# Patient Record
Sex: Female | Born: 1935 | Race: White | Hispanic: No | Smoking: Never smoker
Health system: Southern US, Community
[De-identification: ages and names within clinical notes are randomized; demographics above are authoritative.]

## PROBLEM LIST (undated history)

## (undated) DIAGNOSIS — D649 Anemia, unspecified: Secondary | ICD-10-CM

## (undated) DIAGNOSIS — R269 Unspecified abnormalities of gait and mobility: Secondary | ICD-10-CM

## (undated) DIAGNOSIS — M1990 Unspecified osteoarthritis, unspecified site: Secondary | ICD-10-CM

## (undated) DIAGNOSIS — R7989 Other specified abnormal findings of blood chemistry: Secondary | ICD-10-CM

## (undated) DIAGNOSIS — I10 Essential (primary) hypertension: Secondary | ICD-10-CM

## (undated) DIAGNOSIS — I872 Venous insufficiency (chronic) (peripheral): Secondary | ICD-10-CM

## (undated) DIAGNOSIS — K5900 Constipation, unspecified: Secondary | ICD-10-CM

## (undated) DIAGNOSIS — Z9181 History of falling: Secondary | ICD-10-CM

## (undated) DIAGNOSIS — H6120 Impacted cerumen, unspecified ear: Secondary | ICD-10-CM

## (undated) DIAGNOSIS — I351 Nonrheumatic aortic (valve) insufficiency: Secondary | ICD-10-CM

## (undated) DIAGNOSIS — Q438 Other specified congenital malformations of intestine: Secondary | ICD-10-CM

## (undated) DIAGNOSIS — H6090 Unspecified otitis externa, unspecified ear: Secondary | ICD-10-CM

## (undated) DIAGNOSIS — K219 Gastro-esophageal reflux disease without esophagitis: Secondary | ICD-10-CM

## (undated) DIAGNOSIS — N6324 Unspecified lump in the left breast, lower inner quadrant: Principal | ICD-10-CM

## (undated) DIAGNOSIS — R627 Adult failure to thrive: Secondary | ICD-10-CM

## (undated) DIAGNOSIS — F0390 Unspecified dementia without behavioral disturbance: Secondary | ICD-10-CM

## (undated) DIAGNOSIS — R002 Palpitations: Secondary | ICD-10-CM

## (undated) DIAGNOSIS — K589 Irritable bowel syndrome without diarrhea: Secondary | ICD-10-CM

## (undated) DIAGNOSIS — M545 Low back pain: Secondary | ICD-10-CM

## (undated) DIAGNOSIS — H902 Conductive hearing loss, unspecified: Secondary | ICD-10-CM

## (undated) DIAGNOSIS — I509 Heart failure, unspecified: Secondary | ICD-10-CM

## (undated) DIAGNOSIS — E871 Hypo-osmolality and hyponatremia: Secondary | ICD-10-CM

## (undated) DIAGNOSIS — I7101 Dissection of thoracic aorta: Principal | ICD-10-CM

## (undated) DIAGNOSIS — K644 Residual hemorrhoidal skin tags: Secondary | ICD-10-CM

## (undated) HISTORY — DX: History of falling: Z91.81

## (undated) HISTORY — DX: Impacted cerumen, unspecified ear: H61.20

## (undated) HISTORY — DX: Other specified abnormal findings of blood chemistry: R79.89

## (undated) HISTORY — PX: CATARACT EXTRACTION W/ INTRAOCULAR LENS IMPLANT: SHX1309

## (undated) HISTORY — DX: Nonrheumatic aortic (valve) insufficiency: I35.1

## (undated) HISTORY — DX: Essential (primary) hypertension: I10

## (undated) HISTORY — DX: Unspecified lump in the left breast, lower inner quadrant: N63.24

## (undated) HISTORY — DX: Low back pain: M54.5

## (undated) HISTORY — DX: Dissection of thoracic aorta: I71.01

## (undated) HISTORY — DX: Irritable bowel syndrome, unspecified: K58.9

## (undated) HISTORY — DX: Palpitations: R00.2

## (undated) HISTORY — DX: Other specified congenital malformations of intestine: Q43.8

## (undated) HISTORY — DX: Hypo-osmolality and hyponatremia: E87.1

## (undated) HISTORY — DX: Anemia, unspecified: D64.9

## (undated) HISTORY — DX: Adult failure to thrive: R62.7

## (undated) HISTORY — DX: Unspecified dementia, unspecified severity, without behavioral disturbance, psychotic disturbance, mood disturbance, and anxiety: F03.90

## (undated) HISTORY — DX: Venous insufficiency (chronic) (peripheral): I87.2

## (undated) HISTORY — DX: Gastro-esophageal reflux disease without esophagitis: K21.9

## (undated) HISTORY — DX: Conductive hearing loss, unspecified: H90.2

## (undated) HISTORY — DX: Unspecified osteoarthritis, unspecified site: M19.90

## (undated) HISTORY — DX: Constipation, unspecified: K59.00

## (undated) HISTORY — DX: Heart failure, unspecified: I50.9

## (undated) HISTORY — DX: Residual hemorrhoidal skin tags: K64.4

## (undated) HISTORY — DX: Unspecified otitis externa, unspecified ear: H60.90

## (undated) HISTORY — DX: Unspecified abnormalities of gait and mobility: R26.9

---

## 1958-11-23 HISTORY — PX: DILATION AND CURETTAGE OF UTERUS: SHX78

## 2000-03-03 ENCOUNTER — Encounter: Admission: RE | Admit: 2000-03-03 | Discharge: 2000-03-24 | Payer: Self-pay | Admitting: Family Medicine

## 2000-10-01 ENCOUNTER — Encounter: Payer: Self-pay | Admitting: Family Medicine

## 2000-10-01 ENCOUNTER — Encounter: Admission: RE | Admit: 2000-10-01 | Discharge: 2000-10-01 | Payer: Self-pay | Admitting: Family Medicine

## 2000-10-06 ENCOUNTER — Encounter: Payer: Self-pay | Admitting: Family Medicine

## 2000-10-06 ENCOUNTER — Encounter: Admission: RE | Admit: 2000-10-06 | Discharge: 2000-10-06 | Payer: Self-pay | Admitting: Family Medicine

## 2000-11-23 DIAGNOSIS — Q438 Other specified congenital malformations of intestine: Secondary | ICD-10-CM

## 2000-11-23 HISTORY — DX: Other specified congenital malformations of intestine: Q43.8

## 2001-04-05 ENCOUNTER — Encounter: Admission: RE | Admit: 2001-04-05 | Discharge: 2001-04-05 | Payer: Self-pay | Admitting: Family Medicine

## 2001-04-05 ENCOUNTER — Encounter: Payer: Self-pay | Admitting: Family Medicine

## 2001-09-20 ENCOUNTER — Other Ambulatory Visit: Admission: RE | Admit: 2001-09-20 | Discharge: 2001-09-20 | Payer: Self-pay | Admitting: Family Medicine

## 2001-09-22 ENCOUNTER — Encounter: Payer: Self-pay | Admitting: Family Medicine

## 2001-09-22 ENCOUNTER — Encounter: Admission: RE | Admit: 2001-09-22 | Discharge: 2001-09-22 | Payer: Self-pay | Admitting: Family Medicine

## 2001-10-04 ENCOUNTER — Encounter: Payer: Self-pay | Admitting: Family Medicine

## 2001-10-04 ENCOUNTER — Encounter: Admission: RE | Admit: 2001-10-04 | Discharge: 2001-10-04 | Payer: Self-pay | Admitting: Family Medicine

## 2002-07-14 ENCOUNTER — Encounter: Admission: RE | Admit: 2002-07-14 | Discharge: 2002-07-14 | Payer: Self-pay | Admitting: Family Medicine

## 2002-07-14 ENCOUNTER — Encounter: Payer: Self-pay | Admitting: Family Medicine

## 2002-08-09 ENCOUNTER — Ambulatory Visit (HOSPITAL_COMMUNITY): Admission: RE | Admit: 2002-08-09 | Discharge: 2002-08-09 | Payer: Self-pay | Admitting: Gastroenterology

## 2002-08-09 ENCOUNTER — Encounter: Payer: Self-pay | Admitting: Gastroenterology

## 2002-08-17 ENCOUNTER — Ambulatory Visit (HOSPITAL_COMMUNITY): Admission: RE | Admit: 2002-08-17 | Discharge: 2002-08-17 | Payer: Self-pay | Admitting: Gastroenterology

## 2002-10-30 ENCOUNTER — Encounter: Payer: Self-pay | Admitting: Family Medicine

## 2002-10-30 ENCOUNTER — Encounter: Admission: RE | Admit: 2002-10-30 | Discharge: 2002-10-30 | Payer: Self-pay | Admitting: Family Medicine

## 2003-06-30 DIAGNOSIS — I509 Heart failure, unspecified: Secondary | ICD-10-CM

## 2003-06-30 HISTORY — DX: Heart failure, unspecified: I50.9

## 2003-09-01 ENCOUNTER — Encounter: Payer: Self-pay | Admitting: Emergency Medicine

## 2003-09-01 ENCOUNTER — Inpatient Hospital Stay (HOSPITAL_COMMUNITY): Admission: EM | Admit: 2003-09-01 | Discharge: 2003-09-06 | Payer: Self-pay | Admitting: Emergency Medicine

## 2003-09-02 ENCOUNTER — Encounter: Payer: Self-pay | Admitting: Internal Medicine

## 2003-09-03 ENCOUNTER — Encounter: Payer: Self-pay | Admitting: Internal Medicine

## 2003-09-24 LAB — FECAL OCCULT BLOOD, GUAIAC: Fecal Occult Blood: NEGATIVE

## 2003-12-12 ENCOUNTER — Encounter: Admission: RE | Admit: 2003-12-12 | Discharge: 2003-12-12 | Payer: Self-pay | Admitting: Family Medicine

## 2004-08-17 ENCOUNTER — Inpatient Hospital Stay (HOSPITAL_COMMUNITY): Admission: EM | Admit: 2004-08-17 | Discharge: 2004-08-22 | Payer: Self-pay | Admitting: Emergency Medicine

## 2004-10-09 ENCOUNTER — Ambulatory Visit: Payer: Self-pay | Admitting: Family Medicine

## 2004-12-02 ENCOUNTER — Ambulatory Visit: Payer: Self-pay | Admitting: Family Medicine

## 2005-02-03 ENCOUNTER — Encounter: Admission: RE | Admit: 2005-02-03 | Discharge: 2005-02-03 | Payer: Self-pay | Admitting: Family Medicine

## 2005-03-03 ENCOUNTER — Ambulatory Visit: Payer: Self-pay | Admitting: Family Medicine

## 2005-04-28 ENCOUNTER — Ambulatory Visit: Payer: Self-pay | Admitting: Family Medicine

## 2005-07-31 ENCOUNTER — Ambulatory Visit: Payer: Self-pay | Admitting: Family Medicine

## 2005-09-02 ENCOUNTER — Ambulatory Visit: Payer: Self-pay | Admitting: Family Medicine

## 2006-03-15 ENCOUNTER — Encounter: Admission: RE | Admit: 2006-03-15 | Discharge: 2006-03-15 | Payer: Self-pay | Admitting: Family Medicine

## 2006-09-14 ENCOUNTER — Ambulatory Visit: Payer: Self-pay | Admitting: Family Medicine

## 2006-12-24 ENCOUNTER — Ambulatory Visit: Payer: Self-pay | Admitting: Family Medicine

## 2006-12-24 LAB — CONVERTED CEMR LAB
ALT: 18 units/L (ref 0–40)
AST: 21 units/L (ref 0–37)
BUN: 18 mg/dL (ref 6–23)
Basophils Relative: 0.7 % (ref 0.0–1.0)
Creatinine, Ser: 0.8 mg/dL (ref 0.4–1.2)
Eosinophils Absolute: 0.1 10*3/uL (ref 0.0–0.6)
GFR calc Af Amer: 88 mL/min
Glucose, Bld: 96 mg/dL (ref 70–99)
HCT: 40.7 % (ref 36.0–46.0)
Hemoglobin: 14.2 g/dL (ref 12.0–15.0)
Lymphocytes Relative: 17.2 % (ref 12.0–46.0)
MCHC: 34.9 g/dL (ref 30.0–36.0)
Monocytes Absolute: 0.5 10*3/uL (ref 0.2–0.7)
Monocytes Relative: 8.6 % (ref 3.0–11.0)
Neutrophils Relative %: 72.4 % (ref 43.0–77.0)
Phosphorus: 3.4 mg/dL (ref 2.3–4.6)
Platelets: 179 10*3/uL (ref 150–400)
RBC: 4.29 M/uL (ref 3.87–5.11)
Sodium: 138 meq/L (ref 135–145)
Vit D, 1,25-Dihydroxy: 56 (ref 20–57)

## 2007-01-18 ENCOUNTER — Ambulatory Visit: Payer: Self-pay | Admitting: Family Medicine

## 2007-03-07 ENCOUNTER — Encounter: Payer: Self-pay | Admitting: Family Medicine

## 2007-03-07 DIAGNOSIS — K589 Irritable bowel syndrome without diarrhea: Secondary | ICD-10-CM

## 2007-03-07 DIAGNOSIS — N318 Other neuromuscular dysfunction of bladder: Secondary | ICD-10-CM

## 2007-03-07 DIAGNOSIS — F0150 Vascular dementia without behavioral disturbance: Secondary | ICD-10-CM

## 2007-03-07 DIAGNOSIS — M810 Age-related osteoporosis without current pathological fracture: Secondary | ICD-10-CM | POA: Insufficient documentation

## 2007-03-07 DIAGNOSIS — J309 Allergic rhinitis, unspecified: Secondary | ICD-10-CM | POA: Insufficient documentation

## 2007-03-21 ENCOUNTER — Encounter: Admission: RE | Admit: 2007-03-21 | Discharge: 2007-03-21 | Payer: Self-pay | Admitting: Family Medicine

## 2007-05-19 ENCOUNTER — Telehealth (INDEPENDENT_AMBULATORY_CARE_PROVIDER_SITE_OTHER): Payer: Self-pay | Admitting: *Deleted

## 2007-06-27 ENCOUNTER — Ambulatory Visit: Payer: Self-pay | Admitting: Family Medicine

## 2007-06-27 DIAGNOSIS — M159 Polyosteoarthritis, unspecified: Secondary | ICD-10-CM | POA: Insufficient documentation

## 2007-06-27 DIAGNOSIS — H539 Unspecified visual disturbance: Secondary | ICD-10-CM

## 2007-07-04 LAB — CONVERTED CEMR LAB
Albumin: 3.7 g/dL (ref 3.5–5.2)
BUN: 16 mg/dL (ref 6–23)
Chloride: 102 meq/L (ref 96–112)
Creatinine, Ser: 0.7 mg/dL (ref 0.4–1.2)
GFR calc non Af Amer: 85 mL/min
Phosphorus: 4 mg/dL (ref 2.3–4.6)
Potassium: 5.8 meq/L — ABNORMAL HIGH (ref 3.5–5.1)

## 2007-07-06 ENCOUNTER — Ambulatory Visit: Payer: Self-pay | Admitting: Family Medicine

## 2007-07-06 DIAGNOSIS — R7989 Other specified abnormal findings of blood chemistry: Secondary | ICD-10-CM | POA: Insufficient documentation

## 2007-07-22 ENCOUNTER — Telehealth (INDEPENDENT_AMBULATORY_CARE_PROVIDER_SITE_OTHER): Payer: Self-pay | Admitting: *Deleted

## 2007-08-29 ENCOUNTER — Telehealth: Payer: Self-pay | Admitting: Family Medicine

## 2007-09-05 ENCOUNTER — Ambulatory Visit: Payer: Self-pay | Admitting: Family Medicine

## 2008-04-17 ENCOUNTER — Encounter: Admission: RE | Admit: 2008-04-17 | Discharge: 2008-04-17 | Payer: Self-pay | Admitting: Family Medicine

## 2008-05-01 ENCOUNTER — Encounter: Admission: RE | Admit: 2008-05-01 | Discharge: 2008-05-01 | Payer: Self-pay | Admitting: Family Medicine

## 2008-05-01 ENCOUNTER — Encounter: Payer: Self-pay | Admitting: Family Medicine

## 2008-05-01 ENCOUNTER — Encounter (INDEPENDENT_AMBULATORY_CARE_PROVIDER_SITE_OTHER): Payer: Self-pay | Admitting: Diagnostic Radiology

## 2008-11-08 ENCOUNTER — Ambulatory Visit: Payer: Self-pay | Admitting: Family Medicine

## 2008-11-12 ENCOUNTER — Encounter: Admission: RE | Admit: 2008-11-12 | Discharge: 2008-11-12 | Payer: Self-pay | Admitting: Family Medicine

## 2008-11-20 ENCOUNTER — Ambulatory Visit: Payer: Self-pay | Admitting: Family Medicine

## 2009-04-30 ENCOUNTER — Telehealth: Payer: Self-pay | Admitting: Family Medicine

## 2009-05-01 ENCOUNTER — Ambulatory Visit: Payer: Self-pay | Admitting: Family Medicine

## 2009-05-01 DIAGNOSIS — R634 Abnormal weight loss: Secondary | ICD-10-CM

## 2009-05-03 LAB — CONVERTED CEMR LAB
ALT: 19 U/L
AST: 18 U/L
Albumin: 3.7 g/dL
Alkaline Phosphatase: 46 U/L
BUN: 18 mg/dL
Basophils Absolute: 0 10*3/uL
Basophils Relative: 0.1 %
Bilirubin, Direct: 0.1 mg/dL
CO2: 29 meq/L
Calcium: 9.7 mg/dL
Chloride: 102 meq/L
Creatinine, Ser: 0.9 mg/dL
Eosinophils Absolute: 0.1 10*3/uL
Eosinophils Relative: 1.6 %
Glucose, Bld: 87 mg/dL
HCT: 40.1 %
Hemoglobin: 13.7 g/dL
Lymphocytes Relative: 17.9 %
Lymphs Abs: 1.4 10*3/uL
MCHC: 34.1 g/dL
MCV: 95 fL
Monocytes Absolute: 0.7 10*3/uL
Monocytes Relative: 9.1 %
Neutro Abs: 5.7 10*3/uL
Neutrophils Relative %: 71.3 %
Phosphorus: 3.2 mg/dL
Platelets: 223 10*3/uL
Potassium: 4.5 meq/L
RBC: 4.22 M/uL
RDW: 12.2 %
Sodium: 137 meq/L
TSH: 0.95 u[IU]/mL
Total Bilirubin: 0.4 mg/dL
Total Protein: 7.1 g/dL
Vit D, 25-Hydroxy: 32 ng/mL
WBC: 7.9 10*3/uL

## 2009-06-11 ENCOUNTER — Encounter: Payer: Self-pay | Admitting: Family Medicine

## 2009-10-31 ENCOUNTER — Telehealth: Payer: Self-pay | Admitting: Family Medicine

## 2010-01-07 ENCOUNTER — Encounter: Payer: Self-pay | Admitting: Family Medicine

## 2010-01-07 ENCOUNTER — Ambulatory Visit: Payer: Self-pay | Admitting: Family

## 2010-01-07 ENCOUNTER — Telehealth: Payer: Self-pay | Admitting: Family

## 2010-01-07 DIAGNOSIS — O289 Unspecified abnormal findings on antenatal screening of mother: Secondary | ICD-10-CM | POA: Insufficient documentation

## 2010-01-07 DIAGNOSIS — E559 Vitamin D deficiency, unspecified: Secondary | ICD-10-CM

## 2010-01-07 LAB — CONVERTED CEMR LAB
BUN: 21 mg/dL (ref 6–23)
Basophils Absolute: 0 10*3/uL (ref 0.0–0.1)
Basophils Relative: 0.2 % (ref 0.0–3.0)
Calcium: 9.6 mg/dL (ref 8.4–10.5)
Creatinine, Ser: 0.7 mg/dL (ref 0.4–1.2)
Eosinophils Absolute: 0.2 10*3/uL (ref 0.0–0.7)
Glucose, Bld: 96 mg/dL (ref 70–99)
HCT: 42.8 % (ref 36.0–46.0)
LDL Cholesterol: 78 mg/dL (ref 0–99)
Lymphs Abs: 1.6 10*3/uL (ref 0.7–4.0)
Neutro Abs: 5 10*3/uL (ref 1.4–7.7)
Platelets: 185 10*3/uL (ref 150.0–400.0)
Triglycerides: 49 mg/dL (ref 0.0–149.0)
VLDL: 9.8 mg/dL (ref 0.0–40.0)

## 2010-01-08 ENCOUNTER — Encounter (INDEPENDENT_AMBULATORY_CARE_PROVIDER_SITE_OTHER): Payer: Self-pay | Admitting: *Deleted

## 2010-01-13 LAB — CONVERTED CEMR LAB: Vit D, 25-Hydroxy: 56 ng/mL (ref 30–89)

## 2010-01-17 ENCOUNTER — Encounter: Payer: Self-pay | Admitting: Family Medicine

## 2010-01-17 ENCOUNTER — Ambulatory Visit: Payer: Self-pay | Admitting: Internal Medicine

## 2010-01-21 ENCOUNTER — Ambulatory Visit: Payer: Self-pay | Admitting: Family

## 2010-02-03 ENCOUNTER — Telehealth (INDEPENDENT_AMBULATORY_CARE_PROVIDER_SITE_OTHER): Payer: Self-pay | Admitting: *Deleted

## 2010-02-03 ENCOUNTER — Ambulatory Visit: Payer: Self-pay | Admitting: Family

## 2010-02-03 DIAGNOSIS — Z8679 Personal history of other diseases of the circulatory system: Secondary | ICD-10-CM | POA: Insufficient documentation

## 2010-02-11 ENCOUNTER — Encounter: Payer: Self-pay | Admitting: Family

## 2010-02-11 ENCOUNTER — Telehealth: Payer: Self-pay | Admitting: Family

## 2010-02-12 ENCOUNTER — Encounter: Payer: Self-pay | Admitting: Family

## 2010-02-13 ENCOUNTER — Telehealth (INDEPENDENT_AMBULATORY_CARE_PROVIDER_SITE_OTHER): Payer: Self-pay | Admitting: *Deleted

## 2010-02-25 ENCOUNTER — Ambulatory Visit (HOSPITAL_COMMUNITY): Admission: RE | Admit: 2010-02-25 | Discharge: 2010-02-25 | Payer: Self-pay | Admitting: Family

## 2010-03-11 ENCOUNTER — Telehealth: Payer: Self-pay | Admitting: Family

## 2010-03-12 ENCOUNTER — Ambulatory Visit: Payer: Self-pay | Admitting: Cardiology

## 2010-03-12 ENCOUNTER — Telehealth: Payer: Self-pay | Admitting: Family

## 2010-03-12 DIAGNOSIS — I517 Cardiomegaly: Secondary | ICD-10-CM | POA: Insufficient documentation

## 2010-03-17 ENCOUNTER — Encounter: Payer: Self-pay | Admitting: Family

## 2010-03-26 ENCOUNTER — Telehealth: Payer: Self-pay | Admitting: Family

## 2010-04-14 ENCOUNTER — Ambulatory Visit: Payer: Self-pay | Admitting: Cardiology

## 2010-04-14 ENCOUNTER — Ambulatory Visit: Payer: Self-pay

## 2010-04-14 ENCOUNTER — Ambulatory Visit (HOSPITAL_COMMUNITY): Admission: RE | Admit: 2010-04-14 | Discharge: 2010-04-14 | Payer: Self-pay | Admitting: Cardiology

## 2010-04-14 ENCOUNTER — Encounter: Payer: Self-pay | Admitting: Cardiology

## 2010-04-16 ENCOUNTER — Ambulatory Visit: Payer: Self-pay | Admitting: Cardiology

## 2010-04-16 DIAGNOSIS — I351 Nonrheumatic aortic (valve) insufficiency: Secondary | ICD-10-CM

## 2010-04-16 HISTORY — DX: Nonrheumatic aortic (valve) insufficiency: I35.1

## 2010-04-18 ENCOUNTER — Ambulatory Visit (HOSPITAL_COMMUNITY): Admission: RE | Admit: 2010-04-18 | Discharge: 2010-04-18 | Payer: Self-pay | Admitting: Cardiology

## 2010-04-18 ENCOUNTER — Telehealth: Payer: Self-pay | Admitting: Cardiology

## 2010-04-21 LAB — CONVERTED CEMR LAB
BUN: 18 mg/dL (ref 6–23)
GFR calc non Af Amer: 113.19 mL/min (ref >60)
Glucose, Bld: 97 mg/dL (ref 70–99)
Potassium: 4.4 meq/L (ref 3.5–5.1)
Sodium: 135 meq/L (ref 135–145)

## 2010-04-22 ENCOUNTER — Encounter: Payer: Self-pay | Admitting: Cardiology

## 2010-04-24 ENCOUNTER — Ambulatory Visit: Payer: Self-pay | Admitting: Cardiology

## 2010-04-24 DIAGNOSIS — I71012 Dissection of descending thoracic aorta: Secondary | ICD-10-CM

## 2010-04-24 DIAGNOSIS — I7101 Dissection of thoracic aorta: Secondary | ICD-10-CM

## 2010-04-24 HISTORY — DX: Dissection of descending thoracic aorta: I71.012

## 2010-04-24 HISTORY — DX: Dissection of thoracic aorta: I71.01

## 2010-05-07 ENCOUNTER — Telehealth: Payer: Self-pay | Admitting: Cardiology

## 2010-05-14 ENCOUNTER — Telehealth: Payer: Self-pay | Admitting: Cardiology

## 2010-05-29 ENCOUNTER — Telehealth: Payer: Self-pay | Admitting: Family Medicine

## 2010-06-03 ENCOUNTER — Telehealth: Payer: Self-pay | Admitting: Cardiology

## 2010-06-09 ENCOUNTER — Ambulatory Visit: Payer: Self-pay | Admitting: Family Medicine

## 2010-12-14 ENCOUNTER — Encounter: Payer: Self-pay | Admitting: Internal Medicine

## 2010-12-21 LAB — CONVERTED CEMR LAB
Basophils Absolute: 0 10*3/uL (ref 0.0–0.1)
CO2: 29 meq/L (ref 19–32)
Calcium: 9.6 mg/dL (ref 8.4–10.5)
Creatinine, Ser: 0.6 mg/dL (ref 0.4–1.2)
Eosinophils Relative: 0.9 % (ref 0.0–5.0)
Free T4: 1 ng/dL (ref 0.6–1.6)
GFR calc non Af Amer: 100.27 mL/min (ref >60)
Hemoglobin: 13.5 g/dL (ref 12.0–15.0)
Monocytes Relative: 5.7 % (ref 3.0–12.0)
Neutrophils Relative %: 76.8 % (ref 43.0–77.0)
Potassium: 4 meq/L (ref 3.5–5.1)
T3, Free: 2.3 pg/mL (ref 2.3–4.2)
TSH: 0.39 microintl units/mL (ref 0.35–5.50)
WBC: 7.8 10*3/uL (ref 4.5–10.5)

## 2010-12-23 NOTE — Progress Notes (Signed)
Summary: results of MRA  Phone Note Outgoing Call   Call placed by: Katina Dung, RN, BSN,  Apr 18, 2010 1:32 PM Call placed to: Patient Summary of Call: results of MRA  Follow-up for Phone Call        Dr Shirlee Latch discussed results of MRA done today with son,Charles Hollice Espy and  recommended metoprolol 25mg  twice a day    New/Updated Medications: METOPROLOL TARTRATE 25 MG TABS (METOPROLOL TARTRATE) one twice a day Prescriptions: METOPROLOL TARTRATE 25 MG TABS (METOPROLOL TARTRATE) one twice a day  #60 x 6   Entered by:   Katina Dung, RN, BSN   Authorized by:   Marca Ancona, MD   Signed by:   Katina Dung, RN, BSN on 04/18/2010   Method used:   Electronically to        [facility] ([facility]       [address]       [place], [place]  [address]       Ph: [phone] or [phone]       Fax: [phone]   RxID:   [id number]

## 2010-12-23 NOTE — Progress Notes (Signed)
  Phone Note Outgoing Call   Summary of Call: Could you please call breast center and verify date of last mammo? Thanks Initial call taken by: Lemont Fillers FNP,  January 07, 2010 12:49 PM  Follow-up for Phone Call        called breast center and patient's  last mammogram was done 11/12/08.Marland KitchenDoristine Devoid  January 08, 2010 3:40 PM  Follow-up by: Lemont Fillers FNP,  January 08, 2010 4:41 PM  New Problems: OTHER SCREENING MAMMOGRAM (ICD-V76.12)   New Problems: OTHER SCREENING MAMMOGRAM (ICD-V76.12)

## 2010-12-23 NOTE — Progress Notes (Signed)
Summary: rapid heart beat dizziness  Phone Note Call from Patient   Caller: Son Reason for Call: Referral Summary of Call: pt son states that pt c/o rapid heart beat, and dizziness. pt denies sob,headaches, chest pain, numbness or tingling. Pt son states that pt BP med was just increase. pt has pending OV today but advise if chest pain or discomfort,numbness or tingling occur pt needs to be seen in ED prior to appt. pt son ok.............Marland KitchenFelecia Deloach CMA  February 03, 2010 9:20 AM

## 2010-12-23 NOTE — Progress Notes (Signed)
Summary: refill request  Phone Note Refill Request Message from:  Patient on June 03, 2010 10:54 AM  Refills Requested: Medication #1:  AMLODIPINE BESYLATE 5 MG  TABS one full tablet by mouth daily pt's rx was for 1/2 tab-now taking 1 tab so [facility] a [facility]   Method Requested: Telephone to Pharmacy Initial call taken by: Glynda Jaeger,  June 03, 2010 10:55 AM  Follow-up for Phone Call       Follow-up by: Judithe Modest CMA,  June 03, 2010 2:51 PM    Prescriptions: AMLODIPINE BESYLATE 5 MG  TABS (AMLODIPINE BESYLATE) one full tablet by mouth daily  #30 x 6   Entered by:   Judithe Modest CMA   Authorized by:   Marca Ancona, MD   Signed by:   Judithe Modest CMA on 06/03/2010   Method used:   Electronically to        [facility]       [address]       [place], [place]  [address]       Ph: [phone] or [phone]       Fax: [phone]   RxID:   [id number]

## 2010-12-23 NOTE — Assessment & Plan Note (Signed)
Summary: rapid heart beat//fd   Vital Signs:  Patient profile:   77 year old female Weight:      108 pounds O2 Sat:      95 % on Room air Temp:     97.6 degrees F oral Pulse rate:   96 / minute Pulse rhythm:   regular Resp:     24 per minute BP sitting:   110 / 64  (right arm) Cuff size:   regular  Vitals Entered By: Mervin Kung CMA (February 03, 2010 11:19 AM)  O2 Flow:  Room air CC: room 17  Pounding heart beat this morning after getting up. Had some slight dizziness.   CC:  room 17  Pounding heart beat this morning after getting up. Had some slight dizziness.Marland Kitchen  History of Present Illness: Jamie [name] is an 77 year old female who presents with c/o episode of "heart fluttering" this morning.  Notes that she first noticed this when she got up to make breakfast.  Then it improved.  Notes that it recurred later when she walked.  She denied associated chest pain or shortness of breath.  Symptoms were improved by rest.  Has not experienced these symptoms before.    Allergies: 1)  Fosamax 2)  * Methiolate  Physical Exam  General:  Thin white female in NAD Head:  Normocephalic and atraumatic without obvious abnormalities. No apparent alopecia or balding. Lungs:  Normal respiratory effort, chest expands symmetrically. Lungs are clear to auscultation, no crackles or wheezes. Heart:  Normal rate and regular rhythm. S1 and S2 normal without gallop, murmur, click, rub or other extra sounds. Extremities:  no peripheral edema noted   Impression & Recommendations:  Problem # 1:  PALPITATIONS, HX OF (ICD-V12.50) Assessment New Will plan to check TFT's, CBC to assess for anemia, and BMET to review electolytes.  EKG today shows no acute changes.  Will plan referral to cardiology for further evaluation.  Patient may benefit from holter monitor- will defer to cards.  Pt was instructed to go to ER if she develops recurrent palpitations.  It is possible that she had an episode of SVT or  paroxysmal tachycardia.   Orders: Venipuncture ([address]) TLB-TSH (Thyroid Stimulating Hormone) (84443-TSH) TLB-CBC Platelet - w/Differential (85025-CBCD) TLB-BMP (Basic Metabolic Panel-BMET) (80048-METABOL) Cardiology Referral (Cardiology) TLB-T4 (Thyrox), Free [phone]) TLB-T3, Free (Triiodothyronine) (84481-T3FREE)  Complete Medication List: 1)  Evista 60 Mg Tabs (Raloxifene hcl) .... One by mouth daily 2)  Aricept 10 Mg Tabs (Donepezil hcl) .... One by mouth once daily 3)  Aciphex 20 Mg Tbec (Rabeprazole sodium) .... One by mouth qd 4)  Amlodipine Besylate 5 Mg Tabs (Amlodipine besylate) .... One full tablet by mouth daily 5)  Calcium Citrate 200 Mg Tabs (Calcium citrate) .... One by mouth qid 6)  Fibercon 625 Mg Tabs (Calcium polycarbophil) .... 2 tabs by mouth daily 7)  Allergy Relief 10 Mg Tabs (Loratadine) .... Take 1 tablet by mouth once a day 8)  Biotene Dry Mouth Liqd (Mouthwashes) .... Daily 9)  Astelin 137 Mcg/spray Soln (Azelastine hcl) .Marland Kitchen.. 1-2 sprays in each nostril two times a day 10)  Vitamin D 2000 Unit Tabs (Cholecalciferol) .... Take 1 tablet by mouth once a day 11)  Genteal 0.25-0.3 % Gel (Carboxymethylcell-hypromellose) .... Use daily 12)  Tears Naturale Ii Soln (Artificial tear solution) .... Use daily 13)  Miacalcin 200 Unit/act Soln (Calcitonin (salmon)) .... One spray to alternate nostril daily  Patient Instructions: 1)  You will be called about your referral  to cardiology. 2)  Go to the emergency room if you develop recurrent palpitations.  Current Allergies (reviewed today): FOSAMAX * METHIOLATE

## 2010-12-23 NOTE — Progress Notes (Signed)
Summary: Azelastine 137 mcg nasal spray refill  Phone Note Refill Request Call back at [phone] Message from:  [facility] on May 29, 2010 12:01 PM  Refills Requested: Medication #1:  ASTELIN 137 MCG/SPRAY SOLN 1-2 sprays in each nostril two times a day [facility] [address] [facility]ill on Azelastine  nasal spray. Pt last saw Dr Milinda Antis 05/01/2009. No appt scheduled. Please advise.    Method Requested: Telephone to Pharmacy Initial call taken by: Lewanda Rife LPN,  May 29, 4781 12:02 PM  Follow-up for Phone Call        I think Daryel Gerald is now her provider Follow-up by: Judith Part MD,  May 29, 2010 12:28 PM  Additional Follow-up for Phone Call Additional follow up Details #1::        Pharmacy notified as instructed by telephone. Additional Follow-up by: Sydell Axon LPN,  May 29, 9561 2:12 PM

## 2010-12-23 NOTE — Progress Notes (Signed)
Summary: give b/p readings   Phone Note Call from Patient Call back at [phone]   Caller: Son/ [name] Reason for Call: Talk to Nurse, Talk to Doctor Summary of Call: 6/17 = 136/76 @ 11:40am  6/20 = 140/80 @ 11:40am  6/22 = 132/80 @ 11:40am    Initial call taken by: Omer Jack,  May 14, 2010 11:58 AM     Appended Document: give b/p readings  OK, continue current meds  Appended Document: give b/p readings  discussed with son by telephone

## 2010-12-23 NOTE — Miscellaneous (Signed)
Summary: BONE DENSITY  Clinical Lists Changes  Orders: Added new Test order of T-Bone Densitometry (77080) - Signed Added new Test order of T-Lumbar Vertebral Assessment (77082) - Signed 

## 2010-12-23 NOTE — Progress Notes (Signed)
Summary: Appointment Status  Phone Note Call from Patient Call back at [phone]   Caller: Son- Prince Rome Call For: Lemont Fillers FNP Summary of Call: patients son called  states that he would like to know why patient is unable to have her medications refilled. His concern is that he works and is unable to continue to take off of work for appointments. He states patient is scheduled for her mammogram and cardiology. He was informed that patient missed an appointment for 4/26 that would need to be rescheduled , however he  would like to know why patient is to return for follow up . He state she did have a change in her blood pressure medication, but has already had a follow up for that. He is requesting a return phone call to his cell at [phone] Initial call taken by: Glendell Docker CMA,  March 12, 2010 2:19 PM  Follow-up for Phone Call        Spoke to pt's sons, [name].  I explained that I could only give refills until a pt's next appt.  I saw that pt's appt. on 4/26 had been cancelled and originally thought that needed to be rescheduled.  After further review I saw that pt had come in near the middle of march and was advised to follow up in June. I advised [name] that I could extend refill on Aricept until June. He asked for refills for a year. I explained that [facility] did not give refills for 1 year.  I explained to [name]  that per my previous conversation with Maralyn Sago she stated the [place] office was closer for the pt. and they would have to contact [facility] to arrange a follow up appt sometime in June with a provider there. He voices understanding. Message left on pharmacy voicemail to add 2 refills to the Aricept profile.  Mervin Kung CMA  March 12, 2010 4:33 PM

## 2010-12-23 NOTE — Miscellaneous (Signed)
Summary: Agricultural consultant Release Form   Imported By: Roderic Ovens 03/19/2010 14:54:25  _____________________________________________________________________  External Attachment:    Type:   Image     Comment:   External Document

## 2010-12-23 NOTE — Assessment & Plan Note (Signed)
Summary: per check out/sf   Visit Type:  Follow-up Primary Provider:  Lemont Fillers FNP  CC:  pt has been having a lot of aches and pains today.  History of Present Illness: 77 yo with history of dementia and HTN presented initially for evaluation of palpitations.  Holter showed occasional PACs and rare PVCs that were likely the cause of her symptoms.  Echo was done showing mild to moderate aortic insufficiency with normal EF 60%.  However, an abnormality was noted in the aortic arch.  Therefore, MRA of the chest was done to assess the aorta.  This showed a Type B aortic dissection beginning distal to the left subclavian and extending throught the full length of the aorta.   Labs (3/11): TSH, free T3 and free T4 were all normal.  HCT 41.3, K 4, creatinine 0.6 Labs (5/11): K 4.4, creatinine 0.5  Current Medications (verified): 1)  Aricept 10 Mg  Tabs (Donepezil Hcl) .... One By Mouth Once Daily(Out) 2)  Aciphex 20 Mg  Tbec (Rabeprazole Sodium) .... One By Mouth Qd 3)  Amlodipine Besylate 5 Mg  Tabs (Amlodipine Besylate) .... One Full Tablet By Mouth Daily 4)  Calcium Citrate 200 Mg  Tabs (Calcium Citrate) .... One By Mouth Qid 5)  Fibercon 625 Mg Tabs (Calcium Polycarbophil) .... 2 Tabs By Mouth Daily 6)  Allergy Relief 10 Mg Tabs (Loratadine) .... Take 1 Tablet By Mouth Once A Day 7)  Biotene Dry Mouth  Liqd (Mouthwashes) .... Daily 8)  Astelin 137 Mcg/spray Soln (Azelastine Hcl) .Marland Kitchen.. 1-2 Sprays in Each Nostril Two Times A Day 9)  Vitamin D 2000 Unit Tabs (Cholecalciferol) .... Take 1 Tablet By Mouth Once A Day 10)  Genteal 0.25-0.3 % Gel (Carboxymethylcell-Hypromellose) .... Use Daily 11)  Tears Naturale Ii  Soln (Artificial Tear Solution) .... Use Daily 12)  Reclast 5 Mg/113ml Soln (Zoledronic Acid) .... Once A Year-Pt Received 02/2010 13)  Metoprolol Tartrate 25 Mg Tabs (Metoprolol Tartrate) .... One Twice A Day  Allergies (verified): 1)  Fosamax 2)  * Methiolate  Past  History:  Past Medical History: 1. Osteoporosis 2. Allergic rhinitis 3. Dementia 4. OA hands  5. IBS 6. GERD 7. HTN 8. Palpitations: Holter (5/11) with occasional PACs and rare PVCs.  9. Type B dissection: MRA (5/11) showed Type B aortic dissection extending from distal to the left subclavian to at least the aortic bifurcation.  The ascending thoracic aorta was mildly dilated at 4.2 cm.  10.  Echo (5/11): EF 60%, mild to moderate aortic insufficiency, mild MR.   Family History: Reviewed history from 03/07/2007 and no changes required. mother DM father CAD, HTN sisters DM  Social History: Reviewed history from 03/12/2010 and no changes required. uses meals on wheels  non smoker no alcohol  [facility] [employer]  Review of Systems       All systems reviewed and negative except as per HPI.   Vital Signs:  Patient profile:   77 year old female Height:      64 inches Weight:      103 pounds BMI:     17.74 Pulse rate:   80 / minute BP sitting:   159 / 83  (left arm) Cuff size:   regular  Vitals Entered By: Burnett Kanaris, CNA (April 24, 2010 3:09 PM)  Physical Exam  General:  Frail elderly woman.  Neck:  Neck supple, no JVD. No masses, thyromegaly or abnormal cervical nodes. Lungs:  Clear bilaterally to  auscultation and percussion. Heart:  Lateral PMI, chest non-tender; regular rate and rhythm, S1, S2. +S4.  2/6 early systolic murmur at the RUSB.  Carotid upstroke normal, no bruit.  Pedals normal pulses. No edema, no varicosities. Abdomen:  Bowel sounds positive; abdomen soft and non-tender without masses, organomegaly, or hernias noted. No hepatosplenomegaly. Extremities:  No clubbing or cyanosis. Neurologic:  Alert and oriented x 3. Psych:  Normal affect.   Impression & Recommendations:  Problem # 1:  AORTIC DISSECTION (ICD-441.00) Patient was noted incidentally to have a Type B aortic dissection.  This can be managed medically (she would be a poor  surgical candidate regardless given her age and dementia).  She needs to have good blood pressure control for this.  WIll continue amlodipine and will increase metoprolol to 50 mg two times a day.  We will call her son in 2 wks to check on her blood pressure.  If SBP > 130, will increase amlodipine and metoprolol again.    Problem # 2:  AORTIC VALVE DISEASE (ICD-424.1) Mild to moderate aortic insufficiency on echo.  Will monitor, need good BP control.   Problem # 3:  PALPITATIONS, HX OF (ICD-V12.50) Suspect that this was due to PACs/PVCs.    Patient Instructions: 1)  Your physician has recommended you make the following change in your medication:  2)  Increase Metoprolol to 50mg  twice a day 3)  Your physician has requested that you regularly monitor and record your blood pressure readings at home.  Please use the same machine at the same time of day to check your readings. Call me in 2 weeks with the readings. Luana Shu  4)  Your physician wants you to follow-up in: 6 months with Dr Shirlee Latch.   You will receive a reminder letter in the mail two months in advance. If you don't receive a letter, please call our office to schedule the follow-up appointment. Prescriptions: ARICEPT 10 MG  TABS (DONEPEZIL HCL) one by mouth once daily  #30 x 11   Entered by:   Katina Dung, RN, BSN   Authorized by:   Marca Ancona, MD   Signed by:   Katina Dung, RN, BSN on 04/24/2010   Method used:   Electronically to        [facility]       [address]       [place], [place]  [address]       Ph: [phone] or [phone]       Fax: [phone]   RxID:   [id number] METOPROLOL TARTRATE 50 MG TABS (METOPROLOL TARTRATE) one twice a day  #60 x 11   Entered by:   Katina Dung, RN, BSN   Authorized by:   Marca Ancona, MD   Signed by:   Katina Dung, RN, BSN on 04/24/2010   Method used:   Electronically to        [facility]       [address]        [place], [place]  [address]       Ph: [phone] or [phone]       Fax: [phone]   RxID:   [id number]

## 2010-12-23 NOTE — Letter (Signed)
   [facility] [address] [place], [place] [address] [phone]    January 07, 2010   Jamie [name] [address] [place], [place] [address]  RE:  LAB RESULTS  Dear  [name],  The following is an interpretation of your most recent lab tests.  Please take note of any instructions provided or changes to medications that have resulted from your lab work.  ELECTROLYTES:  Good - no changes needed  KIDNEY FUNCTION TESTS:  Good - no changes needed  LIVER FUNCTION TESTS:  Good - no changes needed  LIPID PANEL:  Good - no changes needed Triglyceride: 49.0   Cholesterol: 181   LDL: 78   HDL: 93.60   Chol/HDL%:  2   DIABETIC STUDIES:  Excellent - no changes needed Blood Glucose: 96    CBC:  Good - no changes needed   Sincerely Yours,    Lemont Fillers FNP

## 2010-12-23 NOTE — Assessment & Plan Note (Signed)
Summary: np6/hx of palps   Primary Provider:  Lemont Fillers FNP  CC:  new patient hx of palpitations.  .  History of Present Illness: 77 yo with history of dementia and HTN presents for evaluation of palpitations.  Patient felt her heart flutter and race for about 2 hours 2 weeks ago.  This was associated with sweating but no chest pain or shortness of breath. Since then, she has had brief palpitations but no long runs. No lightheadedness or syncope.  No prior cardiac problems.  At baseline, she has reasonable exercise tolerance.  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  No exertional CP.  She does not use caffeine or over the counter cold meds.   ECG: NSR, normal  Labs (3/11): TSH, free T3 and free T4 were all normal.  HCT 41.3, K 4, creatinine 0.6  Current Medications (verified): 1)  Aricept 10 Mg  Tabs (Donepezil Hcl) .... One By Mouth Once Daily 2)  Aciphex 20 Mg  Tbec (Rabeprazole Sodium) .... One By Mouth Qd 3)  Amlodipine Besylate 5 Mg  Tabs (Amlodipine Besylate) .... One Full Tablet By Mouth Daily 4)  Calcium Citrate 200 Mg  Tabs (Calcium Citrate) .... One By Mouth Qid 5)  Fibercon 625 Mg Tabs (Calcium Polycarbophil) .... 2 Tabs By Mouth Daily 6)  Allergy Relief 10 Mg Tabs (Loratadine) .... Take 1 Tablet By Mouth Once A Day 7)  Biotene Dry Mouth  Liqd (Mouthwashes) .... Daily 8)  Astelin 137 Mcg/spray Soln (Azelastine Hcl) .Marland Kitchen.. 1-2 Sprays in Each Nostril Two Times A Day 9)  Vitamin D 2000 Unit Tabs (Cholecalciferol) .... Take 1 Tablet By Mouth Once A Day 10)  Genteal 0.25-0.3 % Gel (Carboxymethylcell-Hypromellose) .... Use Daily 11)  Tears Naturale Ii  Soln (Artificial Tear Solution) .... Use Daily 12)  Reclast 5 Mg/155ml Soln (Zoledronic Acid) .... Once A Year-Pt Received 02/2010  Allergies (verified): 1)  Fosamax 2)  * Methiolate  Past History:  Past Medical History: 1. Osteoporosis 2. Allergic rhinitis 3.  Dementia 4. OA hands  5. IBS 6. GERD 7. HTN  Family History: Reviewed history from 03/07/2007 and no changes required. mother DM father CAD, HTN sisters DM  Social History: uses meals on wheels  non smoker no alcohol  [employer] [facility] [employer]  Review of Systems       All systems reviewed and negative except as per HPI.   Vital Signs:  Patient profile:   77 year old female Height:      64.5 inches Weight:      109 pounds Pulse rate:   80 / minute Pulse rhythm:   regular BP sitting:   128 / 72  (left arm) Cuff size:   regular  Vitals Entered By: Judithe Modest CMA (March 12, 2010 4:10 PM)  Physical Exam  General:  Well developed, well nourished, in no acute distress. Head:  normocephalic and atraumatic Nose:  no deformity, discharge, inflammation, or lesions Mouth:  Teeth, gums and palate normal. Oral mucosa normal. Neck:  Neck supple, no JVD. No masses, thyromegaly or abnormal cervical nodes. Lungs:  Clear bilaterally to auscultation and percussion. Heart:  Lateral PMI, chest non-tender; regular rate and rhythm, S1, S2 without murmurs, rubs or gallops. Carotid upstroke normal, no bruit.  Pedals normal pulses. No edema, no varicosities. Abdomen:  Bowel sounds positive; abdomen soft and non-tender without masses, organomegaly, or hernias noted. No hepatosplenomegaly. Msk:  Back normal, normal  gait. Muscle strength and tone normal. Extremities:  No clubbing or cyanosis. Neurologic:  Alert and oriented x 3. Skin:  Intact without lesions or rashes. Psych:  Normal affect.   Impression & Recommendations:  Problem # 1:  PALPITATIONS, HX OF (ICD-V12.50) Patient had a prolonged run of tachypalpitations about 2 weeks ago and is having periodic shorter runs of palpitations now.  I will set her up for a 48 hour holter monitor to assess for significant arrhythmias.   Problem # 2:  CARDIOMEGALY (ICD-429.3) PMI feels lateral on exam, ? enlarged heart.  Will get an  echocardiogram.   Other Orders: EKG w/ Interpretation (93[address]) Holter (Holter) Echocardiogram (Echo)  Patient Instructions: 1)  Your physician recommends that you schedule a follow-up appointment in:  1 month with Dr. Shirlee Latch 2)  Your physician has requested that you have an echocardiogram.  Echocardiography is a painless test that uses sound waves to create images of your heart. It provides your doctor with information about the size and shape of your heart and how well your heart's chambers and valves are working.  This procedure takes approximately one hour. There are no restrictions for this procedure. 3)  Your physician has recommended that you wear a holter monitor.  Holter monitors are medical devices that record the heart's electrical activity. Doctors most often use these monitors to diagnose arrhythmias. Arrhythmias are problems with the speed or rhythm of the heartbeat. The monitor is a small, portable device. You can wear one while you do your normal daily activities. This is usually used to diagnose what is causing palpitations/syncope (passing out).

## 2010-12-23 NOTE — Op Note (Signed)
Summary: [facility]/[place]  [facility]/[place]   Imported By: Lanelle Bal 02/19/2010 10:08:04  _____________________________________________________________________  External Attachment:    Type:   Image     Comment:   External Document

## 2010-12-23 NOTE — Miscellaneous (Signed)
Summary: Nurse, mental health Party Release Form   Imported By: Roderic Ovens 05/02/2010 16:16:49  _____________________________________________________________________  External Attachment:    Type:   Image     Comment:   External Document

## 2010-12-23 NOTE — Assessment & Plan Note (Signed)
Summary: 2 week roa//lch   Vital Signs:  Patient profile:   77 year old female Weight:      107.25 pounds Temp:     97.8 degrees F oral Pulse rate:   88 / minute Pulse rhythm:   regular Resp:     20 per minute BP sitting:   120 / 78  (right arm) Cuff size:   regular  Vitals Entered By: Mervin Kung CMA (January 21, 2010 8:17 AM) CC: room 17  2 week follow up   CC:  room 17  2 week follow up.  History of Present Illness: Jamie [name] is an 77 year old female who presents today to f/u on her HTN and lab work.  She has no complaints  Allergies: 1)  Fosamax 2)  * Methiolate  Physical Exam  General:  Thin elderly white female in NAD Lungs:  Normal respiratory effort, chest expands symmetrically. Lungs are clear to auscultation, no crackles or wheezes. Heart:  Normal rate and regular rhythm. S1 and S2 normal without gallop, murmur, click, rub or other extra sounds. Extremities:  No clubbing, cyanosis, edema, or deformity noted with normal full range of motion of all joints.     Impression & Recommendations:  Problem # 1:  HYPERTENSION, BENIGN ESSENTIAL (ICD-401.1) Assessment Improved Improved, continue increased dose of amlodipine Her updated medication list for this problem includes:    Amlodipine Besylate 5 Mg Tabs (Amlodipine besylate) ..... One full tablet by mouth daily  Problem # 2:  OSTEOPOROSIS (ICD-733.00) Assessment: Comment Only Complete dexa- awaiting results- continue current meds.  Vitamin D is normal Her updated medication list for this problem includes:    Evista 60 Mg Tabs (Raloxifene hcl) ..... One by mouth daily    Miacalcin 200 Unit/act Soln (Calcitonin (salmon)) ..... One spray to alternate nostril daily  Complete Medication List: 1)  Evista 60 Mg Tabs (Raloxifene hcl) .... One by mouth daily 2)  Aricept 10 Mg Tabs (Donepezil hcl) .... One by mouth once daily 3)  Aciphex 20 Mg Tbec (Rabeprazole sodium) .... One by mouth qd 4)  Amlodipine Besylate 5  Mg Tabs (Amlodipine besylate) .... One full tablet by mouth daily 5)  Calcium Citrate 200 Mg Tabs (Calcium citrate) .... One by mouth qid 6)  Fibercon 625 Mg Tabs (Calcium polycarbophil) .... 2 tabs by mouth daily 7)  Allergy Relief 10 Mg Tabs (Loratadine) .... Take 1 tablet by mouth once a day 8)  Biotene Dry Mouth Liqd (Mouthwashes) .... Daily 9)  Astelin 137 Mcg/spray Soln (Azelastine hcl) .Marland Kitchen.. 1-2 sprays in each nostril two times a day 10)  Vitamin D 2000 Unit Tabs (Cholecalciferol) .... Take 1 tablet by mouth once a day 11)  Genteal 0.25-0.3 % Gel (Carboxymethylcell-hypromellose) .... Use daily 12)  Tears Naturale Ii Soln (Artificial tear solution) .... Use daily 13)  Miacalcin 200 Unit/act Soln (Calcitonin (salmon)) .... One spray to alternate nostril daily  Patient Instructions: 1)  Please schedule a follow-up appointment in 3 months. 2)  Sooner if problems or concerns  Current Allergies (reviewed today): FOSAMAX * METHIOLATE

## 2010-12-23 NOTE — Progress Notes (Signed)
Summary: b/p readings  Phone Note Call from Patient Call back at [phone]   Caller: Son/Charles Hollice Espy Reason for Call: Talk to Nurse, Talk to Doctor Summary of Call: was told to call back to give you update on his mother b/p readings they are listed below.   04/25/2010 @ 11:30am   -  172/96  04/28/2010 @ 11:30a   -  160/90  04/30/2010 @ 11:25a  - 138/78  05/02/2010 @ 11:30a  - 170/80  05/05/2010 @ 11:50a - 140/78  05/07/2010 @ 11:10a - 120/70 Initial call taken by: Omer Jack,  May 07, 2010 11:34 AM     Appended Document: b/p readings Increase amlodipine to 10 mg daily, continue current metoprolol.  Followup BP check in 2 wks.   Appended Document: b/p readings discussed with son,Charles--he was concerned that the high readings were not accurate--he wants to check a few more readings before making medication adjustments--I reviewed with Dr Avery Klingbeil--OK to check a few more readings and call in 5-7 days --[name] will call me back the first of the week with more readings

## 2010-12-23 NOTE — Procedures (Signed)
Summary: summary report  summary report   Imported By: Mirna Mires 04/22/2010 11:30:44  _____________________________________________________________________  External Attachment:    Type:   Image     Comment:   External Document

## 2010-12-23 NOTE — Progress Notes (Signed)
  Phone Note Outgoing Call   Summary of Call: Reviewed results of dexascan.  Will plan to refer for reclast.   Initial call taken by: Lemont Fillers FNP,  February 11, 2010 9:06 AM  Follow-up for Phone Call        Forms completed for relast and faxed to determing if ins. will cover infusion. If ins. does approve, per Brock Mokry pt. will need to stop Evista. Mervin Kung CMA  February 11, 2010 10:02 AM   Additional Follow-up for Phone Call Additional follow up Details #1::        please call back daughter in law as soon as possible. To reschedule a procedure that she says Nicki Guadalajara set up for patient. Call back Amara Justen [phone] Additional Follow-up by: Michaelle Copas,  February 12, 2010 2:29 PM    Additional Follow-up for Phone Call Additional follow up Details #2::    Received notice that reclast is covered by pt. ins.  Advised pt's daughter-in-law, Maralyn Sago of coverage and gave her phone number to reimbursement hotline to contact for further questions.  Notified her that procedure has been scheduled for 02/20/10 @ 12pm. Pt. must register in admitting at [facility]. at 11:30am. Advised her that pt. needs to drink a couple glasses of fluids a few hours prior to appt. Pt. also needs to stop Evista and Miacalcin nasal spray but may continue calcium and vit d.  Advised Maralyn Sago that reclast will be repeated annually.  Referral form faxed to short stay [phone] attn Dondra Spry. Mervin Kung CMA  February 12, 2010 2:56 PM    Current Allergies: FOSAMAX * METHIOLATE

## 2010-12-23 NOTE — Progress Notes (Signed)
Summary: please call back daughter in law  Phone Note Call from Patient   Caller: Daughter Summary of Call: Jamie [name]- daughter in [phone], please call her back so she can talk to you about patient's appt.... They want to change that appt. time that you set them up with. Thank you Initial call taken by: Michaelle Copas,  February 13, 2010 9:26 AM  Follow-up for Phone Call        Reclast infusion has been rescheduled for 02/25/10 @ 12pm at daughter-in-law's request as pt. will be back in town at that time. Jamie [name] was notified of new date/time. @ 1:12pm. Mervin Kung CMA  February 13, 2010 1:13 PM

## 2010-12-23 NOTE — Assessment & Plan Note (Signed)
Summary: NEW MEDICARE PT TO ESTAB--COMING FROM [facility]   Vital Signs:  Patient profile:   77 year old female Height:      64.5 inches Weight:      108 pounds Pulse rate:   76 / minute BP sitting:   160 / 80  (left arm)  Vitals Entered By: Doristine Devoid (January 07, 2010 10:01 AM) CC: NEW EST- refill on meds and general check and labs    CC:  NEW EST- refill on meds and general check and labs .  History of Present Illness: Ms Jamie [name] is an 77 year old female who presents today to establish care.  She was previously followed by Dr Milinda Antis at [facility].  Notes that she recently moved to the independent care portion of [facility] and wishes to transfer care to our clinic.  She has no concerns at this time.  Preventative- has not had a pap smear in "years", son reports normal colo 2006.  Tells me she had mammogram with the last 1 year but  I do not have report.  Notes + walking and light weight exercise.  Eats a healthy diet.  Has not had bone density in several years, intolerant to fosamax and has been taking evista and miacalcin.  Miacalcin ran out.  Allergies: 1)  Fosamax 2)  * Methiolate  Review of Systems       Constitutional: Denies Fever ENT:  Denies nasal congestion or sore throat. Resp: Denies cough CV:  Denies Chest Pain GI:  Denies nausea or vomitting, + gas (takes OTC gasx) GU: Denies dysuria, notes + frequency, but drinks a lot of water Lymphatic: Denies lymphadenopathy Musculoskeletal:  notes + aches and pain Skin:  Denies Rashes Psychiatric: Son notes that patient becomes depressed and anxious at times- doing better since moving to [facility] Neuro: Denies numbness, son notes memory is pretty good for her age.       Physical Exam  General:  elderly, frail white female, NAD Head:  Normocephalic and atraumatic without obvious abnormalities. No apparent alopecia or balding. Eyes:  L pupil located at 12 oclock (history of surgery left eye) R  Pupil ERRLA Ears:  Hearin aids Nose:  External nasal examination shows no deformity or inflammation. Nasal mucosa are pink and moist without lesions or exudates. Mouth:  Oral mucosa and oropharynx without lesions or exudates.  Teeth in good repair. Neck:  No deformities, masses, or tenderness noted. Breasts:  No mass, nodules, thickening, tenderness, bulging, retraction, inflamation, nipple discharge or skin changes noted.   Lungs:  Normal respiratory effort, chest expands symmetrically. Lungs are clear to auscultation, no crackles or wheezes. Heart:  Normal rate and regular rhythm. S1 and S2 normal without gallop, murmur, click, rub or other extra sounds. Abdomen:  Bowel sounds positive,abdomen soft and non-tender without masses, organomegaly or hernias noted. Genitalia:  declined PAP at this time- will consider Msk:  No deformity or scoliosis noted of thoracic or lumbar spine.   Extremities:  No clubbing, cyanosis, edema, or deformity noted with normal full range of motion of all joints.   Neurologic:  cranial nerves II-XII intact, strength normal in all extremities, and gait normal.   Skin:  Intact without suspicious lesions or rashes Psych:  Cognition and judgment appear intact. Alert and cooperative with normal attention span and concentration. No apparent delusions, illusions, hallucinations   Impression & Recommendations:  Problem # 1:  HYPERTENSION, BENIGN ESSENTIAL (ICD-401.1) Assessment Deteriorated Will plan to increase amlodipine from 2.5  to 5 mg by mouth daily, f/u 2 weeks Her updated medication list for this problem includes:    Amlodipine Besylate 5 Mg Tabs (Amlodipine besylate) ..... One full tablet by mouth daily  BP today: 160/80 Prior BP: 110/70 (05/01/2009)  Labs Reviewed: K+: 4.5 (05/01/2009) Creat: : 0.9 (05/01/2009)     Orders: TLB-CBC Platelet - w/Differential (85025-CBCD) TLB-BMP (Basic Metabolic Panel-BMET) (80048-METABOL)  Problem # 2:  OSTEOPOROSIS  (ICD-733.00) Assessment: Comment Only Will check vitamin D, resume miacalcin and order f/u dexascan The following medications were removed from the medication list:    Miacalcin 200 Unit/act Nasal Soln (Calcitonin (salmon)) ..... Use as directed Her updated medication list for this problem includes:    Evista 60 Mg Tabs (Raloxifene hcl) ..... One by mouth daily    Miacalcin 200 Unit/act Soln (Calcitonin (salmon)) ..... One spray to alternate nostril daily  Orders: Misc. Referral (Misc. Ref) Venipuncture (16109)  Problem # 3:  DEMENTIA (ICD-294.8) Assessment: Unchanged This appears stable on aricept per son's report. Continue same  Problem # 4:  UNSPECIFIED VITAMIN D DEFICIENCY (ICD-268.9) Assessment: Comment Only check level Orders: T-Assay of Vitamin D ([id number])  Complete Medication List: 1)  Evista 60 Mg Tabs (Raloxifene hcl) .... One by mouth daily 2)  Aricept 10 Mg Tabs (Donepezil hcl) .... One by mouth once daily 3)  Aciphex 20 Mg Tbec (Rabeprazole sodium) .... One by mouth qd 4)  Amlodipine Besylate 5 Mg Tabs (Amlodipine besylate) .... One full tablet by mouth daily 5)  Calcium Citrate 200 Mg Tabs (Calcium citrate) .... One by mouth qid 6)  Fibercon 625 Mg Tabs (Calcium polycarbophil) .... 2 tabs by mouth daily 7)  Allergy Relief 10 Mg Tabs (Loratadine) .... Take 1 tablet by mouth once a day 8)  Biotene Dry Mouth Liqd (Mouthwashes) .... Daily 9)  Astelin 137 Mcg/spray Soln (Azelastine hcl) .Marland Kitchen.. 1-2 sprays in each nostril two times a day 10)  Vitamin D 2000 Unit Tabs (Cholecalciferol) .... Take 1 tablet by mouth once a day 11)  Genteal 0.25-0.3 % Gel (Carboxymethylcell-hypromellose) .... Use daily 12)  Tears Naturale Ii Soln (Artificial tear solution) .... Use daily 13)  Miacalcin 200 Unit/act Soln (Calcitonin (salmon)) .... One spray to alternate nostril daily  Other Orders: TLB-Lipid Panel (80061-LIPID)  Patient Instructions: 1)  Please return in 2 weeks for  follow up of your blood work and follow up of your BP. Prescriptions: TEARS NATURALE II  SOLN (ARTIFICIAL TEAR SOLUTION) use daily  #1 x 2   Entered and Authorized by:   Lemont Fillers FNP   Signed by:   Lemont Fillers FNP on 01/07/2010   Method used:   Electronically to        [facility] (retail)       [address]       [place], [place]  [address]       Ph: [phone] or [phone]       Fax: [phone]   RxID:   [id number] GENTEAL 0.25-0.3 % GEL (CARBOXYMETHYLCELL-HYPROMELLOSE) use daily  #1 x 2   Entered and Authorized by:   Lemont Fillers FNP   Signed by:   Lemont Fillers FNP on 01/07/2010   Method used:   Electronically to        [facility] (retail)       [address]       [place], [place]  [address]       Ph: [phone] or [phone]  Fax: [phone]   RxID:   [id number] AMLODIPINE BESYLATE 5 MG  TABS (AMLODIPINE BESYLATE) one half by mouth qd  #15 Tablet x 2   Entered and Authorized by:   Lemont Fillers FNP   Signed by:   Lemont Fillers FNP on 01/07/2010   Method used:   Electronically to        [facility] (retail)       [address]       [place], [place]  [address]       Ph: [phone] or [phone]       Fax: [phone]   RxID:   [id number] ACIPHEX 20 MG  TBEC (RABEPRAZOLE SODIUM) one by mouth qd  #30 Tablet x 2   Entered and Authorized by:   Lemont Fillers FNP   Signed by:   Lemont Fillers FNP on 01/07/2010   Method used:   Electronically to        [facility] (retail)       [address]       [place], [place]  [address]       Ph: [phone] or [phone]       Fax: [phone]   RxID:   [id number] ARICEPT 10 MG  TABS (DONEPEZIL HCL) one by mouth once daily  #30 x 2   Entered and Authorized by:   Lemont Fillers FNP   Signed by:   Lemont Fillers FNP on 01/07/2010   Method used:   Electronically to        [facility] (retail)        [address]       [place], [place]  [address]       Ph: [phone] or [phone]       Fax: [phone]   RxID:   [id number] MIACALCIN 200 UNIT/ACT SOLN (CALCITONIN (SALMON)) one spray to alternate nostril daily  #1 x 0   Entered and Authorized by:   Lemont Fillers FNP   Signed by:   Lemont Fillers FNP on 01/07/2010   Method used:   Electronically to        [facility] (retail)       [address]       [place], [place]  [address]       Ph: [phone] or [phone]       Fax: [phone]   RxID:   [id number]

## 2010-12-23 NOTE — Progress Notes (Signed)
  Phone Note Outgoing Call   Call placed by: Lemont Fillers FNP,  Mar 27, 2010 8:09 AM Call placed to: Patient Details for Reason: Response to Letter from Son Summary of Call: Received letter from Hollace Hayward( patient's son) today, which voiced his concerns about requests for follow up visit and medication refills.  He voiced frustration in regards to the multiple appointments such as bone scan, cardiology, Reclast infusions and fear about taking too much  time off from his job. "I have to make a living." Chart reviewed.  Patient was seen on 3/1 for follow up of HTN and osteoporosis.  At this visit a 3 month follow was recommended.  She was then seen 3/14 with complaint of palpitations and was referred to Dr. Shirlee Latch (cardiology).  TFT's performed at that visit noted borderline low normal TSH.  I explained to the patient's son that we are happy to refill her Aricept but that I would like to see her back in early June for follow up of her chronic medical issues and to repeat TFT's.  Explained to him that given her advanced age and chronic medical issues that we would plan to see her about every three months. I instructed him call and arrange a follow up visit in early June at his convenience.   He verbalized understanding.  I  Initial call taken by: Lemont Fillers FNP,  Mar 27, 2010 8:16 AM    Prescriptions: ARICEPT 10 MG  TABS (DONEPEZIL HCL) one by mouth once daily  #30 x 1   Entered and Authorized by:   Lemont Fillers FNP   Signed by:   Lemont Fillers FNP on 03/26/2010   Method used:   Electronically to        [facility] (retail)       [address]       [place], [place]  [address]       Ph: [phone] or [phone]       Fax: [phone]   RxID:   [id number]

## 2010-12-23 NOTE — Progress Notes (Signed)
Summary: Rx refill--needs appt.--lm  Phone Note Refill Request Message from:  Fax from Pharmacy on March 11, 2010 4:19 PM  Refills Requested: Medication #1:  ARICEPT 10 MG  TABS one by mouth once daily   Dosage confirmed as above?Dosage Confirmed   Last Refilled: 03/09/2010  Method Requested: Electronic Next Appointment Scheduled: 4.20.11 4:00pm Initial call taken by: Lannette Donath,  March 11, 2010 4:21 PM  Follow-up for Phone Call        Left message on machine to return call.  Pt. needs to reschedule 4/26 cancelled appt. with Anderia Lorenzo.  Nicki Guadalajara Fergerson CMA  March 12, 2010 9:00 AM     Prescriptions: ARICEPT 10 MG  TABS (DONEPEZIL HCL) one by mouth once daily  #30 x 0   Entered by:   Mervin Kung CMA   Authorized by:   Lemont Fillers FNP   Signed by:   Mervin Kung CMA on 03/12/2010   Method used:   Electronically to        [facility] (retail)       [address]       [place], [place]  [address]       Ph: [phone] or [phone]       Fax: [phone]   RxID:   [id number]   Appended Document: Rx refill--needs appt.--lm Spoke to pt's daughter-in-law, Maralyn Sago and she stated that the [facility] office is closer for patient and they would like to be seen there.  I advised Maralyn Sago they will need to contact the [facility] office to make an appt. with a Provider there.  She voices understanding.

## 2010-12-23 NOTE — Letter (Signed)
Summary: Primary Care Consult Scheduled Letter  [facility]  [address] [place], [place] [address]   Phone: [phone]  Fax: [phone]      01/08/2010 MRN: [id number]  Jamie [name] [address] [place], [place]  [address]    Dear Ms. Cammarano,    We have scheduled an appointment for you.  At the recommendation of Sandford Craze, FNP, we have scheduled you for a Bone Density Scan at [facility] on 01-17-2010 at 10:00am.  Their address is [address] [place], [place] [address]. The office phone number is [phone].  If this appointment day and time is not convenient for you, please feel free to call the office of the doctor you are being referred to at the number listed above and reschedule the appointment.    It is important for you to keep your scheduled appointments. We are here to make sure you are given good patient care.   Thank you,    Renee, Patient Care Coordinator [facility]

## 2010-12-23 NOTE — Medication Information (Signed)
Summary: Patient Assistance Form/Reclast  Patient Assistance Form/Reclast   Imported By: Lanelle Bal 02/19/2010 10:06:57  _____________________________________________________________________  External Attachment:    Type:   Image     Comment:   External Document

## 2010-12-23 NOTE — Letter (Signed)
Summary: Letter from Son with Patient Concerns  Letter from Son with Patient Concerns   Imported By: Lanelle Bal 04/18/2010 13:26:48  _____________________________________________________________________  External Attachment:    Type:   Image     Comment:   External Document

## 2010-12-23 NOTE — Assessment & Plan Note (Signed)
Summary: NEW/SCRIPT/OLD PT OD Jamie [name]--PH   Vital Signs:  Patient profile:   77 year old female Weight:      104.6 pounds Temp:     97.9 degrees F oral BP sitting:   130 / 70  (left arm) Cuff size:   regular  Vitals Entered By: Kathrynn Speed CMA (June 09, 2010 10:35 AM) CC: New script old Pt Jamie [name] pt / src   History of Present Illness: 77 yo woman here today for  1) Dementia- son feels Aricept is slowing the decline, 'i'm afraid to stop it'.  pt now having help w/ meds but living in independent section of [facility].  2) HTN- BP adequately controlled.  needs tighter control due to recently discovered aortic dissection.  no CP, SOB, dizziness, edema, N/V  3) Osteoporosis- UTD on Reclast injxn.  Taking Ca and Vit D daily.  4) Wt loss- pt has gained wt due to recent Ensure intake.  Son is POA and pt has living will.  they are in agreement that pt does not want/need excessive screening measures or procedures done.  Problems Prior to Update: 1)  Aortic Dissection  (ICD-441.00) 2)  Aortic Valve Disease  (ICD-424.1) 3)  Cardiomegaly  (ICD-429.3) 4)  Palpitations, Hx of  (ICD-V12.50) 5)  Other Screening Mammogram  (ICD-V76.12) 6)  Screening For Lipoid Disorders  (ICD-V77.91) 7)  Abnormal Finding On Antenatal Screening  (ICD-796.5) 8)  Unspecified Vitamin D Deficiency  (ICD-268.9) 9)  Weight Loss  (ICD-783.21) 10)  Abnormal Blood Chemistry Nec  (ICD-790.6) 11)  Osteoarthrosis, Generalized, Multiple Sites  (ICD-715.09) 12)  Hypertension, Benign Essential  (ICD-401.1) 13)  Disturbance, Visual Nos  (ICD-368.9) 14)  Polydipsia, 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 One By Mouth Once Daily 2)  Aciphex 20 Mg  Tbec (Rabeprazole Sodium)  .... One By Mouth Qd 3)  Amlodipine Besylate 5 Mg  Tabs (Amlodipine Besylate) .... One Full Tablet By Mouth Daily 4)  Calcium Citrate 200 Mg  Tabs (Calcium Citrate) .... One By Mouth Qid 5)  Fibercon 625 Mg Tabs (Calcium Polycarbophil) .... 2 Tabs By Mouth Daily 6)  Allergy Relief 10 Mg Tabs (Loratadine) .... Take 1 Tablet By Mouth Once A Day 7)  Biotene Dry Mouth  Liqd (Mouthwashes) .... Daily 8)  Astelin 137 Mcg/spray Soln (Azelastine Hcl) .Marland Kitchen.. 1-2 Sprays in Each Nostril Two Times A Day 9)  Vitamin D 2000 Unit Tabs (Cholecalciferol) .... Take 1 Tablet By Mouth Once A Day 10)  Genteal 0.25-0.3 % Gel (Carboxymethylcell-Hypromellose) .... Use Daily 11)  Tears Naturale Ii  Soln (Artificial Tear Solution) .... Use Daily 12)  Reclast 5 Mg/157ml Soln (Zoledronic Acid) .... Once A Year-Pt Received 02/2010 13)  Metoprolol Tartrate 50 Mg Tabs (Metoprolol Tartrate) .... One Twice A Day  Allergies (verified): 1)  Fosamax 2)  * Methiolate  Past History:  Past Medical History: Last updated: 04/24/2010 1. Osteoporosis 2. Allergic rhinitis 3. Dementia 4. OA hands  5. IBS 6. GERD 7. HTN 8. Palpitations: Holter (5/11) with occasional PACs and rare PVCs.  9. Type B dissection: MRA (5/11) showed Type B aortic dissection extending from distal to the left subclavian to at least the aortic bifurcation.  The ascending thoracic aorta was mildly dilated at 4.2 cm.  10.  Echo (5/11): EF 60%, mild to moderate aortic insufficiency, mild MR.   Social History: Last updated: 03/12/2010 uses meals on wheels  non smoker no alcohol  [facility]  Review of Systems      See HPI  Physical Exam  General:  Thin white female in NAD Head:  Normocephalic and atraumatic without obvious abnormalities. No apparent alopecia or balding. Neck:  No deformities, masses, or tenderness noted. Lungs:  Normal respiratory effort, chest expands symmetrically. Lungs are clear to auscultation, no crackles  or wheezes. Heart:  reg S1/S2, II/VI SEM at RUSB Pulses:  +2 carotid, radial, DP Extremities:  no C/C/E   Impression & Recommendations:  Problem # 1:  HYPERTENSION, BENIGN ESSENTIAL (ICD-401.1) Assessment Unchanged w/ discovery of aortic dissection will need somewhat tight BP control but given pt's frailty fear hypotension and falls.  will follow closely. Her updated medication list for this problem includes:    Amlodipine Besylate 5 Mg Tabs (Amlodipine besylate) ..... One full tablet by mouth daily    Metoprolol Tartrate 50 Mg Tabs (Metoprolol tartrate) ..... One twice a day  Problem # 2:  WEIGHT LOSS (ICD-783.21) Assessment: Unchanged stablized w/ addition of Ensure.  will follow.  Problem # 3:  OSTEOPOROSIS (ICD-733.00) Assessment: Unchanged UTD on Reclast injxn. Her updated medication list for this problem includes:    Calcium Citrate 200 Mg Tabs (Calcium citrate) ..... One by mouth qid    Vitamin D 2000 Unit Tabs (Cholecalciferol) .Marland Kitchen... Take 1 tablet by mouth once a day    Reclast 5 Mg/164ml Soln (Zoledronic acid) ..... Once a year-pt received 02/2010  Problem # 4:  DEMENTIA (ICD-294.8) Assessment: Unchanged continue Aricept.  discussed pt's care and health maintainence w/ son (POA).  son prefers to take a relatively hands-off approach at this time.  discussed that i would be happy to comply w/ that, treating pt as needed as issues arise.  Complete Medication List: 1)  Aricept 10 Mg Tabs (Donepezil hcl) .... One by mouth once daily 2)  Aciphex 20 Mg Tbec (Rabeprazole sodium) .... One by mouth qd 3)  Amlodipine Besylate 5 Mg Tabs (Amlodipine besylate) .... One full tablet by mouth daily 4)  Calcium Citrate 200 Mg Tabs (Calcium citrate) .... One by mouth qid 5)  Fibercon 625 Mg Tabs (Calcium polycarbophil) .... 2 tabs by mouth daily 6)  Allergy Relief 10 Mg Tabs (Loratadine) .... Take 1 tablet by mouth once a day 7)  Biotene Dry Mouth Liqd (Mouthwashes) .... Daily 8)   Astelin 137 Mcg/spray Soln (Azelastine hcl) .Marland Kitchen.. 1-2 sprays in each nostril two times a day 9)  Vitamin D 2000 Unit Tabs (Cholecalciferol) .... Take 1 tablet by mouth once a day 10)  Genteal 0.25-0.3 % Gel (Carboxymethylcell-hypromellose) .... Use daily 11)  Tears Naturale Ii Soln (Artificial tear solution) .... Use daily 12)  Reclast 5 Mg/184ml Soln (Zoledronic acid) .... Once a year-pt received 02/2010 13)  Metoprolol Tartrate 50 Mg Tabs (Metoprolol tartrate) .... One twice a day  Other Orders: Prescription Created Electronically [phone])  Patient Instructions: 1)  Please schedule a follow-up appointment in 6 months, sooner if needed. 2)  We'll search for copies of the POA and living will- if we don't have them on file, we'll call and ask for new copies 3)  If you need any refills on meds, please just call 4)  If you have any concerns, let me know 5)  Have a great summer!  Prescriptions: ASTELIN 137 MCG/SPRAY SOLN (AZELASTINE  HCL) 1-2 sprays in each nostril two times a day  #1 month x 11   Entered and Authorized by:   Neena Rhymes MD   Signed by:   Neena Rhymes MD on 06/09/2010   Method used:   Electronically to        [id number] (retail)       [address]       [place], [place]  [address]       Ph: [phone] or [phone]       Fax: [phone]   RxID:   [id number]

## 2011-04-10 NOTE — Consult Note (Signed)
NAME:  Jamie [name], Jamie [name]                           ACCOUNT NO.:  [id number]   MEDICAL RECORD NO.:  [id number]                   PATIENT TYPE:  INP   LOCATION:  5155                                 FACILITY:  [facility]   PHYSICIAN:  Casimiro Needle L. Thad Ranger, M.D.           DATE OF BIRTH:  [birth year]   DATE OF CONSULTATION:  09/03/2003  DATE OF DISCHARGE:                                   CONSULTATION   REFERRING PHYSICIAN:  Corwin Levins, M.D. [facility]   REASON FOR EVALUATION:  Altered mental status.   HISTORY OF PRESENT ILLNESS:  This is the initial inpatient consultation  evaluation of this 77 year old woman admitted on September 01, 2003 after being  found at home slumped over, poorly responsive.  Upon arrival in the  emergency room she was noted to be febrile at 102.6 and had a serum sodium  of 114.  She was admitted for work-up and treatment including intravenous  antibiotics and correction of her hyponatremia.  Initial CT in the emergency  room was performed and is reviewed.  Since admission to the stepdown unit,  the patient has been described as awake, but generally not oriented.  She  also became fairly agitated during the day yesterday requiring Haldol and  presently the family is attending 24 hours a day to the patient.  The  patient presently is lying in bed, drowsy, but alerting easily to voice.  She reports she has mild headache as well as a back ache.  The patient's  daughter at the bed side reports that she is doing much better than she was  yesterday.  When asked about her previous status she indicates that the  patient does live alone.  She does not drive and does not manage her own  financial affairs and never really did do this.  She is keeping up her house  fairly well.  Her daughter says that overall she is sharp as a tack,  although on steady questioning she has had some episodes in the past few  months where she has repeated things that she has already told her.  This  does not  seem to be a big problem, however.   PAST MEDICAL HISTORY:  The patient has few chronic medical problems.  She  has seen Anselmo Rod, M.D. in the past for stomach problems and is on  Aciphex for gastroesophageal reflux disease.   SOCIAL HISTORY:  She lives alone as stated above and for the most part is  independent in activities of daily living.   FAMILY HISTORY:  Deferred.   REVIEW OF SYSTEMS:  Per H&P and admission nursing records.  Pertinent  positives include the fever, as above.  She was also incontinent of urine  upon having been found.   PHYSICAL EXAMINATION:  VITAL SIGNS:  Afebrile at 97.8, blood pressure  140/70, pulse 55, respirations 20.  GENERAL:  This is an elderly,  rather frail appearing woman lying supine in  the hospital bed in no evident distress.  HEENT:  Head:  Cranium is normocephalic, atraumatic.  Oropharynx is benign.  NECK:  Supple without carotid or supraclavicular bruits.  HEART:  Regular rate and rhythm without murmurs.  NEUROLOGIC:  Mental status:  She is drowsy, but easily alerts to voice.  She  identifies the month as October, the year as 1994 and says that she is in  [place].  After I inform her that she is at [facility] in  [place] she insists that I have the wrong information and that she is,  in fact, in [place].  She is able to follow three step commands.  She  has no difficulty naming objects and can repeat a phrase.  Mood is euthymic,  affect appropriate and she is appropriately cooperative.  Cranial nerves:  Right pupil is round, 3 mm, briskly reactive to 2.  Left pupil is irregular,  but reactive.  Extraocular movements are full without nystagmus.  She blinks  to threat from both sides.  Face, tongue, and palate move normally and  symmetrically.  Motor:  Normal bulk and tone.  She has essentially normal  strength on test extremity muscles.  Sensation:  Intact to light touch  throughout.  Reflexes are 2+ and symmetric.   Toes are downgoing.  Cerebellar:  Finger-to-nose is performed adequate.  Gait is deferred.   LABORATORIES:  Serum sodium on admission 114, 125 yesterday, 140 this  morning.  CBC on admission shows elevated white count 13.9.  Most recent CBC  done yesterday afternoon demonstrates persistent leukocytosis with white  count 14.8.  TSH and B12 are unremarkable.  I personally reviewed the CT of  the head performed on admission, September 01, 2003 and agreed that it does not  demonstrate any acute abnormalities.  It is remarkable for some atrophy and  mild small vessel disease, but nothing that is very remarkable for the  patient's age.   IMPRESSION:  1. Encephalopathy.  This appears to be toxic metabolic in nature.  She has a     nonfocal examination at this time and she is getting over a fairly severe     bout of hyponatremia as well as an acute febrile illness.  She appeared     to be getting somewhat better.  2. Possible underlying dementia.  There is not very much evidence for this     at present.  3. Febrile illness.  No further fevers on Rocephin.  4. Hyponatremia now corrected.   PLAN:  Will check repeat CT of the head.  If negative I would recommend only  ongoing expectant management.  She has been receiving p.r.n. Haldol and this  is appropriate for agitation.  Would recommend that she have a physical  therapy and occupational therapy consult and receive stimulation as much as  possible during the day to keep her awake, then be allowed to rest at night  as much as possible.  If she is not clear within a few days, then EEG may be  appropriate.  Will follow with you.  Thank you for the consultation.                                               Michael L. Thad Ranger, M.D.    MLR/MEDQ  D:  09/03/2003  T:  09/03/2003  Job:  [id number]   cc:   Marne A. Milinda Antis, M.D. [facility]

## 2011-04-10 NOTE — Discharge Summary (Signed)
NAME:  Jamie [name], Jamie [name]                           ACCOUNT NO.:  [id number]   MEDICAL RECORD NO.:  [id number]                   PATIENT TYPE:  INP   LOCATION:  5155                                 FACILITY:  [facility]   PHYSICIAN:  Sean A. Everardo All, M.D. [facility]           DATE OF BIRTH:  [birth year]   DATE OF ADMISSION:  09/01/2003  DATE OF DISCHARGE:  09/06/2003                                 DISCHARGE SUMMARY   DISCHARGE DIAGNOSES:  1. Hyponatremia.  2. Fever.  3. Mental status changes.  4. Hypoglycemia.  5. Toxic metabolic encephalopathy.  6. Left pneumonia.   BRIEF ADMISSION HISTORY:  Ms. Febo is an 77 year old white female who was  brought to the emergency room by EMS.  They were apparently called by  family, who found her slumped in a chair and uncommunicative.  She was also  noted to be incontinent of urine.  In the ER she was still obtunded.  Initial temperature in the emergency room was 102.6.  She was also found to  have a sodium of 114.  The patient was admitted for further evaluation and  treatment.   PAST MEDICAL HISTORY:  1. Left renal cyst.  2. Gastroesophageal reflux disease.  3. Allergies.  4. Postmenopausal.  5. Questionable history of left cataract repair.   HOSPITAL COURSE:  Problem 1.  HYPONATREMIA:  This was quite significant.  The etiology was not  clear.  The patient was started on normal saline IV to slowly replace her  sodium.  Her workup was unremarkable for an etiology.  There was no evidence  of congestive heart failure, renal failure, GI loss.  She was not  hypothyroid.  There was no evidence of adrenal insufficiency.  There was no  indication of medications that might have caused hyponatremia, including her  diuretics and NSAIDs.  Of note, the patient did have evidence of pneumonia  on admission and she may have just been dehydrated.  Reportedly, per the  patient's family she is also very compulsive about no added sodium in her  diet.  The patient's  sodium has been corrected at this time.   Problem 2.  HYPOKALEMIA:  This might have also been related to dehydration  and has also corrected.   Problem 3.  INFECTIOUS DISEASE:  The patient presented with a fever.  Initial chest x-ray, urinalysis were unremarkable.  CT of the chest,  however, was suspicious for a left pneumonia.  The patient has received five  days of antibiotics and is currently on oral antibiotics to complete a full  10-day treatment course.   Problem 4.  HYPOGLYCEMIA:  The patient had an episode of hypoglycemia with a  CBG of 51.  The patient was not previously noted to be diabetic.  The  patient had two hemoglobin A1Cs done.  The first was 5.5 and normal.  The  second was 6.4 and mildly elevated.  The  patient has not had any further  subsequent hypoglycemia or hyperglycemia.   Problem 5.  NEUROLOGIC, MENTAL STATUS CHANGES:  This was thought to be  multifactorial, but we did ask for neurology to see the patient.  The  patient was seen in consultation by Casimiro Needle L. Thad Ranger, M.D.  His  impression was that this was encephalopathy, probably toxic metabolic.  He  did not feel there was any indication of a CVA.  Her head CT on admission  was without any acute abnormalities.  We did repeat her head CT on September 03, 2003, and it continued to be without any acute abnormalities.  The  patient's mental status is improving.  The patient did have a brief acute  exacerbation of her confusion after receiving Ativan.  It is felt the  patient is intolerant to Ativan.   PHYSICAL EXAMINATION AT DISCHARGE:  VITAL SIGNS:  She is afebrile, blood  pressure 128/75, heart rate 94, respirations 18, O2 saturation was 95% on  room air.  CBG was 97 and 120.  GENERAL:  This is a very frail, elderly white female in no acute distress.  HEENT:  Unremarkable.  NECK:  Without carotid bruits, JVD, or adenopathy.  CHEST:  Lungs are clear without wheezes, rales, or rhonchi.  CARDIOVASCULAR:   Regular without murmurs, rubs, or gallops.  ABDOMEN:  Bowel sounds are present, soft and nontender.  EXTREMITIES:  Without edema.  NEUROLOGIC:  The patient is alert.  She moves all four extremities, and  there is no obvious focal deficit at this time.   LABORATORY DATA AT DISCHARGE:  Potassium 4.3.  A.M. cortisol was 24.3.  Urine culture was negative.  Sodium was 130.  Hemoglobin 13.1.  LFTs were  normal.  Hemoglobin A1C was 5.5% and 6.4%.  Total cholesterol 142,  triglycerides 56, HDL 80, LDL 51.  TSH was 0.722.  B12 was 1247.  Blood  cultures were negative x2.  Chest x-ray revealed COPD and cardiomegaly.   DISCHARGE MEDICATIONS:  1. Aciphex 20 mg daily.  2. Evista 60 mg daily.  3. Allegra 180 mg daily.  4. Ceftin 250 mg b.i.d. for four more days.   FOLLOW-UP:  Follow up with Dr. Milinda Antis as needed.      Cornell Barman, P.A. [facility]                  Sean A. Everardo All, M.D. [facility]    LC/MEDQ  D:  09/06/2003  T:  09/06/2003  Job:  [id number]   cc:   Marne A. Milinda Antis, M.D. [facility]

## 2011-04-10 NOTE — Consult Note (Signed)
Jamie [name], [name]                 ACCOUNT NO.:  [id number]   MEDICAL RECORD NO.:  [id number]          PATIENT TYPE:  INP   LOCATION:  5155                         FACILITY:  [facility]   PHYSICIAN:  Gustavus Messing. Orlin Hilding, M.D.DATE OF BIRTH:  1920/07/01   DATE OF CONSULTATION:  08/19/2004  DATE OF DISCHARGE:                                   CONSULTATION   CHIEF COMPLAINT:  Altered mental status.   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  She was exhibiting altered behavior, would not answer the door  when a friend came to see her.  Sodium was in the mid 120 range, and is  being corrected, it is not yet fully corrected.  It is 133 today which is  still a little bit low.  The patient continues to have some mild  encephalopathic changes.  This is quite similar to an admission  approximately a year ago on September 03, 2003, when she had exactly the same  thing occur.  She was seen in consultation by Dr. Thad Ranger with  encephalopathy that appeared to be toxic metabolic in nature with  hyponatremia.  At that time, she also had an acute febrile illness.  She was  getting better.  Dr. Thad Ranger also questioned the possibility of underlying  dementia, although there was not much evidence for that.  At present, she  does live alone, and at least is able to compensate at home if she does have  potentially an underlying compensated dementia.   REVIEW OF SYSTEMS:  Negative for chest pain, shortness of breath, or  headache.  No dizziness.   PAST MEDICAL HISTORY:  1.  Previous episodes of admission for hyponatremia related to psychogenic      polydipsia with confusion which resolved.  2.  Osteoporosis.   PAST SURGICAL HISTORY:  No surgeries.   MEDICATIONS:  1.  Amlodipine.  2.  Calcitonin.  3.  Cipro.  4.  Pantoprazole.  5.  _____________.  6.  Maalox.  7.  Promethazine.   ALLERGIES:  1.  LORAZEPAM.  2.  IODINE.   SOCIAL HISTORY:  She lives alone in [place].  No alcohol or tobacco use.  She is apparently independent in her activities of daily living prior to  admission.   FAMILY HISTORY:  Positive for coronary artery disease.   PHYSICAL EXAMINATION:  VITAL SIGNS:  Temperature 98, pulse 75, blood  pressure 120/72, respirations 20, 95% on room air.  GENERAL:  The patient is a pleasant woman who is mildly confused, a little  bit suspicious.  HEENT:  Head is normocephalic, atraumatic.  NECK:  Supple without bruits.  NEUROLOGIC:  She scores 18/30 on a miny mental status examination with some  problems with comprehension.  A lot of this may be due to a hearing deficit  that she has.  She follows simple commands mostly with motor commands fairly  well.  Complex commands seem to give her trouble.  She is not fully  oriented.  She  knows she is at [name], but thinks that is in  [place], [place].  She cannot tell me the county.  She thinks the year is  2002.  She is accurate on the month and the day of the week, September,  Tuesday, but not the date in September.  She registers 3/3 items, but does  not recall them.  She is unable to spell World backwards correctly;  spelling it Dowr, so she got the first and the last letter correct.  She is  able to name objects, repeat sentences, and follow simple commands as noted.  Her cranial nerves are unremarkable.  She has an eccentric iridectomy on the  left.  The visual fields are full to confrontation.  Extraocular movements  are intact.  Facial sensation is normal.  Facial motor activity is normal.  Hearing is diminished bilaterally.  She wears hearing aids.  Palate is  symmetric, and tongue is midline.  On motor examination there is no drift or  satelliting, normal rapid fine movement, normal bulk, tone, and strength,  normal station.  Reflexes are symmetrically diminished.  Downgoing toes.  Coordination:   Finger-to-nose and heel-to-shin are intact.  Sensory  examination is intact.   LABORATORY DATA:  MRI of the brain shows chronic small vessel disease and  atrophy __________ acute.  MRA is negative.  Sodium is 133 today, was 128  the day before yesterday.   IMPRESSION:  1.  Metabolic encephalopathy secondary to hyponatremia, not yet fully      corrected.  Similar episode last year associated with psychogenic      polydipsia.  With people, especially with underlying dementia and      elderly people, there may be a considerable lag of several days between      correction of labs and improvement of cognitive status.  2.  Possible underlying compensated dementia.   RECOMMENDATIONS:  1.  Tincture of time.  2.  Continue to optimize sodium.  3.  Cognitive recovery may lag behind the recovery of the labs.  4.  Just recommend re-orienting the patient, stimulating her during the day,      allowing her to sleep at night.  Consider alternative to Cipro, however,      which can precipitate an encephalopathy.       CAW/MEDQ  D:  08/19/2004  T:  08/19/2004  Job:  [id number]

## 2011-04-10 NOTE — Discharge Summary (Signed)
NAMEBREANE, Jamie [name]                 ACCOUNT NO.:  [id number]   MEDICAL RECORD NO.:  [id number]          PATIENT TYPE:  INP   LOCATION:  5155                         FACILITY:  [facility]   PHYSICIAN:  Rene Paci, M.D. LHCDATE OF BIRTH:  06-25-1920   DATE OF ADMISSION:  08/17/2004  DATE OF DISCHARGE:                                 DISCHARGE SUMMARY   DISCHARGE DIAGNOSES:  1.  Hyponatremia.  2.  Mental status changes.  3.  Underlying dementia.  4.  Gait disorder.  5.  Mild hypokalemia.   HISTORY OF PRESENT ILLNESS:  Jamie [name] is an 77 year old white female who  was noted to be confused on the day of admission.  The patient describes  several days to weeks of abdominal discomfort associated with nausea.  The  patient has had diminished p.o. intake.  Otherwise, he adheres to a strict  low sodium diet and drinks lots of free water.   PAST MEDICAL HISTORY:  1.  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  2.  Osteoporosis.  3.  Hard of hearing.  4.  History of a left renal cyst.  5.  Gastroesophageal reflux disease.   HOSPITAL COURSE:  PROBLEM #1:  HYPONATREMIA:  The patient presented with a  sodium of 124.  This improved with normal saline initially.  The patient's  serum osmolality was 274.  Her urine osmolality was 116, and her urine  sodium was 22.  This picture appeared to be consistent yet again psychogenic  polydipsia.  Her IV fluids were discontinued and she was fluid restricted at  1500 mL a day with normalization of her sodium levels.  We did rule out  adrenal insufficiency and the patient did have a normal cortisol.  We also  ruled out hypothyroidism.  The patient did have a normal TSH.   PROBLEM #2 - MENTAL STATUS CHANGES:  The patient was significantly confused  during this admission.  We suspected that this was most likely secondary to  her hyponatremia.  We did check a TSH, RPR and B12 which were all normal.  We also did a head CT that  was negative for acute abnormalities.  There was  no identifiable infectious process.  Therefore, we did ask for neurology to  comment.  They suspected that this was a toxic metabolic encephalopathy  secondary to her hyponatremia.  They also suspected and we agree that she  probably has an underlying compensated dementia.  Dr. Orlin Hilding suspected that  her metabolic encephalopathy would improve over time as her sodium  normalized and it has.  She does have some underlying what we feel to be  baseline dementia.   PROBLEM #3 - FALLS:  The patient had been having falls at home.  This  prompted a PT evaluation.  They noticed that she was leaning heavily to the  left and lost her balance several times.  They made recommendation for 24  hour supervision and specifically skilled nursing facility.  These  arrangements have been made.   PROBLEM #4 - GASTROINTESTINAL:  The patient did present with some  abdominal  pain.  Hepatic flexure profile was normal, and the patient had no further  abdominal pain.  Therefore, we did not pursue any further workup.   DISPOSITION:  The patient will be discharged to a skilled nursing facility.   DISCHARGE PHYSICAL EXAMINATION:  VITAL SIGNS:  The patient is afebrile.  Blood pressure is 112/68, heart rate 75, respirations 18, O2 saturations  were 97% on room air.  GENERAL:  This is a well-developed, well-nourished elderly white female in  no acute distress.  HEENT:  Unremarkable.  NECK:  Without carotid bruits, jugular venous distention or adenopathy.  LUNGS:  Clear without wheezes, rales or rhonchi.  CARDIOVASCULAR:  Regular without murmurs, rubs or gallops.  ABDOMEN:  Bowel sounds are present, soft and nontender.  EXTREMITIES:  Without edema.  NEUROLOGICAL:  She is alert and oriented x3 without any focal deficits.   DISCHARGE LABORATORY DATA:  Her sodium was 137.  CBC was normal.  Coags were  normal.  LFTs were normal.  The TSH was 0.515.   DISCHARGE  MEDICATIONS:  1.  Evista 60 mg every day.  2.  Aciphex 20 mg every day.  3.  Miacalcin nasal spray every day.  4.  Norvasc 5 mg every day.   DIET:  Regular as tolerated.  However, she is on a 1500 mL fluid restriction  every day   FOLLOW UP:  Follow up with Dr. Milinda Antis as needed.       LC/MEDQ  D:  08/22/2004  T:  08/22/2004  Job:  [id number]   cc:   Santina Evans A. Orlin Hilding, M.D.  [address]  [address]  [place]  [place] [address]  Fax: [phone]   Idamae Schuller A. Milinda Antis, M.D. [facility]

## 2011-04-10 NOTE — H&P (Signed)
NAME:  Jamie [name], CONNOLLY                           ACCOUNT NO.:  [id number]   MEDICAL RECORD NO.:  [id number]                   PATIENT TYPE:  EMS   LOCATION:  MAJO                                 FACILITY:  [facility]   PHYSICIAN:  Corwin Levins, M.D. [facility]             DATE OF BIRTH:  [birth year]   DATE OF ADMISSION:  09/01/2003  DATE OF DISCHARGE:                                HISTORY & PHYSICAL   CHIEF COMPLAINT:  Confusion, fever and weakness in the emergency room with  otherwise  minimal history.   HISTORY OF PRESENT ILLNESS:  Ms. Jamie [name] is an 77 year old white female  brought to the emergency room by EMS, apparently  called by the family, who  found her slumped in a chair, uncommunicative, with incontinence of urine.  She remains obtunded in the emergency room. The family is not here. Her  initial temperature  was 102.6 and serum sodium 114, now for admission for  further evaluation and treatment.   PAST MEDICAL HISTORY:  1. Left renal cyst.  2. Gastroesophageal reflux disease.  3. Allergies.  4. Post menopausal.   PAST SURGICAL HISTORY:  Probable left cataract.   ALLERGIES:  No known drug allergies.   CURRENT MEDICATIONS:  1. Aciphex 20 mg p.o. every day.  2. Evista 60 mg p.o. every day.  3. Allegra 180 mg p.o. every day.  4. Over-the-counter sinus caplet for pain relief.   Social history, family history, review of systems not obtainable. No family  here. Unable to reach by phone at the moment.   PHYSICAL EXAMINATION:  GENERAL:  An 77 year old white female, otherwise  obtunded.  VITAL SIGNS:  Blood pressure 180/100, respirations 32 to 40, heart rate 124,  temperature initial 102.6.  HEENT:  She appears flushed. Head somewhat deviated and eyes deviated toward  the right. Pharynx dry but seems to gurgle when breathing.  NECK:  No lymphadenopathy or JVD.  CHEST:  Decreased breath sounds bilaterally, no wheezing  or rales.  CARDIAC:  Regular rate and rhythm.  ABDOMEN:   Soft, positive bowel sounds, no organomegaly.  EXTREMITIES:  No edema, no rash or cellulitis noted. No nuchal rigidity.   LABORATORY DATA:  Urinalysis  micro negative, otherwise with positive  ketones, 1+ protein. White blood cell count 13.9, hemoglobin 15.7. Sodium  114, potassium 4.2, chloride 83, bicarbonate 19, BUN 21, creatinine 0.9,  glucose 151.   Chest x-ray with COPD, cardiac enlargement, questionable new fracture left  7th rib anteriorly. Head CT scan no acute changes.   ASSESSMENT AND PLAN:  1. Hyponatremia, severe, questionable etiology, but by examination seems to     be most likely hypotonic, hypovolemic, hyponatremia, although the BUN and     creatinine seem reasonable at this time. Will check serum and urine     electrolytes and osmolarity, although she has already received some     normal saline in the emergency  room. She will be treated otherwise  with     IV normal saline with a goal serum sodium of 120 to 124 by the morning.  2. Fever, questionable etiology. Urinalysis and chest x-ray  initially     negative but could be dry and could represent pneumonia. Will treat with     oxygen. Blood cultures done in the emergency room, Rocephin begun in the     emergency room. Will check urine culture otherwise.  3. Neurological, obtunded. Likely secondary to #1 and #2. Treatment as     above.  4. Chronic obstructive pulmonary disease. Will do nebulizers q.4h.  5. Questionable fracture left 7th rib.  6. Hyperglycemia. Check capillary blood gases, apply sliding-scale insulin.  7. Full code for now.  8. Empiric proton pump inhibitor.                                                Corwin Levins, M.D. [facility]    JWJ/MEDQ  D:  09/01/2003  T:  09/01/2003  Job:  [id number]   cc:   Marne A. Milinda Antis, M.D. [facility]   Jyothi Elsie Amis, M.D.  [address]  [address]  [place]  [place] [address]  Fax: [phone]

## 2011-04-10 NOTE — Op Note (Signed)
   Jamie [name], Jamie [name]                             ACCOUNT NO.:  [id number]   MEDICAL RECORD NO.:  [id number]                   PATIENT TYPE:  AMB   LOCATION:  ENDO                                 FACILITY:  [facility]   PHYSICIAN:  Charna Elizabeth, M.D.                   DATE OF BIRTH:  [birth year]   DATE OF PROCEDURE:  08/17/2002  DATE OF DISCHARGE:                                 OPERATIVE REPORT   PROCEDURE:  Esophagogastroduodenoscopy.   ENDOSCOPIST:  Charna Elizabeth, M.D.   INSTRUMENT USED:  Olympus video panendoscope.   INDICATION FOR PROCEDURE:  Epigastric pain with nausea and vomiting in an 77-  year-old white female.  Rule out peptic ulcer disease, esophagitis,  gastritis, etc.   PREPROCEDURE PREPARATION:  Informed consent was procured from the patient.  The patient was fasted for eight hours prior to the procedure.   PREPROCEDURE PHYSICAL:  VITAL SIGNS:  The patient had stable vital signs.  NECK:  Supple.  CHEST:  Clear to auscultation.  S1, S2 regular.  ABDOMEN:  Soft with normal bowel sounds.   DESCRIPTION OF PROCEDURE:  The patient was placed in the left lateral  decubitus position and sedated with 20 mg of Demerol and 4 mg of Versed  intravenously.  Once the patient was adequately sedate and maintained on low-  flow oxygen and continuous cardiac monitoring, the Olympus video  panendoscope was advanced through the mouthpiece, over the tongue, into the  esophagus under direct vision.  The entire esophagus appeared normal with no  evidence of ring, stricture, masses, esophagitis, or Barrett's mucosa.  The  scope was then advanced to the stomach.  There was mild antral gastritis.  The rest of the gastric mucosa appeared normal.  No ulcers, erosions,  masses, or polyps were seen.  The proximal small bowel appeared normal as  well.  The patient tolerated the procedure well without complications.   PLAN:  1. Continue PPIs for now.  2.     Avoid nonsteroidals.  3. Follow  antireflux measures.  4. Outpatient follow-up in the next two weeks.  If symptoms are not     improved, further workup will be undertaken.                                               Charna Elizabeth, M.D.    JM/MEDQ  D:  08/18/2002  T:  08/18/2002  Job:  [id number]   cc:   at [facility] [place], FNP, [facility]   Laurita Quint, M.D.

## 2011-04-22 ENCOUNTER — Telehealth: Payer: Self-pay | Admitting: Family Medicine

## 2011-04-22 ENCOUNTER — Other Ambulatory Visit: Payer: Self-pay | Admitting: Cardiology

## 2011-04-22 NOTE — Telephone Encounter (Signed)
Benefit form faxed and awaiting response. I tried to call and notify pt son, phone rings one time only and disconnects the line (x2).

## 2011-04-22 NOTE — Telephone Encounter (Signed)
Ok to proceed with benefit re-verification or do you prefer to wait until CPX?

## 2011-04-22 NOTE — Telephone Encounter (Signed)
Patient has cpx scheduled [id number] ---- her son [name] wants to know if patient can be scheduled for reclast - last reclast [phone]

## 2011-04-22 NOTE — Telephone Encounter (Signed)
Ok to schedule.

## 2011-04-23 NOTE — Telephone Encounter (Signed)
Pt son notified that I have started re-verification process and will call once I have statement that Reclast is covered by insurance.

## 2011-05-04 ENCOUNTER — Other Ambulatory Visit: Payer: Self-pay | Admitting: Family Medicine

## 2011-05-04 DIAGNOSIS — M810 Age-related osteoporosis without current pathological fracture: Secondary | ICD-10-CM

## 2011-05-05 ENCOUNTER — Other Ambulatory Visit (INDEPENDENT_AMBULATORY_CARE_PROVIDER_SITE_OTHER): Payer: Medicare Other

## 2011-05-05 DIAGNOSIS — M810 Age-related osteoporosis without current pathological fracture: Secondary | ICD-10-CM

## 2011-05-05 NOTE — Progress Notes (Signed)
Labs only

## 2011-05-19 ENCOUNTER — Ambulatory Visit (HOSPITAL_COMMUNITY): Payer: Medicare Other | Attending: Family Medicine

## 2011-05-19 DIAGNOSIS — M810 Age-related osteoporosis without current pathological fracture: Secondary | ICD-10-CM | POA: Insufficient documentation

## 2011-05-21 ENCOUNTER — Encounter: Payer: Self-pay | Admitting: Family Medicine

## 2011-05-23 ENCOUNTER — Other Ambulatory Visit: Payer: Self-pay | Admitting: Cardiology

## 2011-05-25 ENCOUNTER — Encounter: Payer: Self-pay | Admitting: Family Medicine

## 2011-05-25 ENCOUNTER — Ambulatory Visit (INDEPENDENT_AMBULATORY_CARE_PROVIDER_SITE_OTHER): Payer: Medicare Other | Admitting: Family Medicine

## 2011-05-25 DIAGNOSIS — E559 Vitamin D deficiency, unspecified: Secondary | ICD-10-CM

## 2011-05-25 DIAGNOSIS — K5909 Other constipation: Secondary | ICD-10-CM | POA: Insufficient documentation

## 2011-05-25 DIAGNOSIS — M546 Pain in thoracic spine: Secondary | ICD-10-CM

## 2011-05-25 DIAGNOSIS — I10 Essential (primary) hypertension: Secondary | ICD-10-CM

## 2011-05-25 DIAGNOSIS — K5900 Constipation, unspecified: Secondary | ICD-10-CM

## 2011-05-25 DIAGNOSIS — J309 Allergic rhinitis, unspecified: Secondary | ICD-10-CM

## 2011-05-25 DIAGNOSIS — M549 Dorsalgia, unspecified: Secondary | ICD-10-CM | POA: Insufficient documentation

## 2011-05-25 LAB — CBC WITH DIFFERENTIAL/PLATELET
Eosinophils Relative: 2 % (ref 0.0–5.0)
HCT: 40 % (ref 36.0–46.0)
Hemoglobin: 13.4 g/dL (ref 12.0–15.0)
Lymphs Abs: 1.4 10*3/uL (ref 0.7–4.0)
Monocytes Relative: 8.7 % (ref 3.0–12.0)
Neutro Abs: 6.2 10*3/uL (ref 1.4–7.7)
RBC: 4.3 Mil/uL (ref 3.87–5.11)
WBC: 8.6 10*3/uL (ref 4.5–10.5)

## 2011-05-25 LAB — BASIC METABOLIC PANEL
CO2: 32 mEq/L (ref 19–32)
Calcium: 9.9 mg/dL (ref 8.4–10.5)
Chloride: 95 mEq/L — ABNORMAL LOW (ref 96–112)
Creatinine, Ser: 0.7 mg/dL (ref 0.4–1.2)
Glucose, Bld: 87 mg/dL (ref 70–99)

## 2011-05-25 LAB — HEPATIC FUNCTION PANEL
ALT: 19 U/L (ref 0–35)
Albumin: 4.2 g/dL (ref 3.5–5.2)
Alkaline Phosphatase: 44 U/L (ref 39–117)
Total Protein: 7.2 g/dL (ref 6.0–8.3)

## 2011-05-25 MED ORDER — AZELASTINE HCL 0.1 % NA SOLN: 1.0000 | Freq: Two times a day (BID) | NASAL | Status: DC

## 2011-05-25 NOTE — Progress Notes (Signed)
  Subjective:    Patient ID: Jamie [name], female    DOB: May 21, 1920, 77 y.o.   MRN: [id number]  HPI Constipation- 'i don't want to take a lot of medication'.  Drinking a lot of water, walking regularly, eating a lot of fresh fruit but 'it doesn't seem to be helping me'.  Uncertain of BM frequency, 'i can go for days w/out them'.  'i try not to strain'.  Has been a problem for 'a couple of weeks or more'.  Sinus problem- 'itchy watery eyes, runny nose, dry throat, headache'.  Has not been using allergy nasal spray.  Taking Claritin daily.  Having some facial pain.  'enough to know that i've got it'.  HTN- chronic problem for pt, excellent control today.  On Lopressor, amlodipine.  No CP, SOB, edema.  Back pain- pain is located between shoulder blades, intermittent.  Pain worsens w/ increased walking.  Pt denies pain today.  ** pt is poor historian and accompanied by daughter-in-law who reports her husband, pt's son, is the one w/ most of the answers.  Family still in agreement that they want quality of life issues addressed by minimal interventions**   Review of Systems For ROS see HPI     Objective:   Physical Exam  Vitals reviewed. Constitutional: No distress.       Thin, elderly woman.  Very hard of hearing  HENT:  Head: Normocephalic and atraumatic.       Edematous turbinates + PND TMs normal bilaterally No TTP over frontal or maxillary sinuses  Eyes: Conjunctivae are normal. Pupils are equal, round, and reactive to light.  Neck: Normal range of motion. Neck supple. No thyromegaly present.  Cardiovascular: Normal rate, regular rhythm and intact distal pulses.   Murmur (III/VI SEM) heard. Pulmonary/Chest: Effort normal and breath sounds normal. No respiratory distress. She has no wheezes. She has no rales.  Abdominal: Soft. Bowel sounds are normal. She exhibits no distension. There is no tenderness. There is no rebound and no guarding.  Musculoskeletal: She exhibits no edema.      No TTP over thoracic spine Mild-moderate kyphosis  Lymphadenopathy:    She has no cervical adenopathy.  Neurological: She is alert.  Skin: Skin is warm and dry.          Assessment & Plan:

## 2011-05-25 NOTE — Patient Instructions (Signed)
Follow up in 6 months- sooner if needed Take the Claritin daily Add the nasal spray to improve sinus symptoms Add the miralax daily to improve constipation Take Tylenol as needed for back pain- use heating pad for pain relief Call with any questions or concerns Have a great summer!

## 2011-05-26 ENCOUNTER — Other Ambulatory Visit (INDEPENDENT_AMBULATORY_CARE_PROVIDER_SITE_OTHER): Payer: Medicare Other

## 2011-05-26 DIAGNOSIS — E875 Hyperkalemia: Secondary | ICD-10-CM

## 2011-05-26 LAB — BASIC METABOLIC PANEL
Chloride: 98 mEq/L (ref 96–112)
Creatinine, Ser: 0.8 mg/dL (ref 0.4–1.2)
GFR: 69.72 mL/min (ref >60.00)

## 2011-05-26 NOTE — Progress Notes (Signed)
@  BARCODE2D(Error - No data available.)@ Labs only

## 2011-05-29 ENCOUNTER — Other Ambulatory Visit: Payer: Self-pay | Admitting: Family Medicine

## 2011-05-29 ENCOUNTER — Other Ambulatory Visit: Payer: Self-pay | Admitting: *Deleted

## 2011-05-29 DIAGNOSIS — I10 Essential (primary) hypertension: Secondary | ICD-10-CM

## 2011-05-29 MED ORDER — DONEPEZIL HCL 10 MG PO TABS: 10.0000 mg | ORAL_TABLET | Freq: Every day | ORAL | Status: DC

## 2011-06-01 ENCOUNTER — Other Ambulatory Visit (INDEPENDENT_AMBULATORY_CARE_PROVIDER_SITE_OTHER): Payer: Medicare Other

## 2011-06-01 DIAGNOSIS — I10 Essential (primary) hypertension: Secondary | ICD-10-CM

## 2011-06-01 NOTE — Progress Notes (Signed)
Labs only

## 2011-06-14 NOTE — Assessment & Plan Note (Signed)
Excellent control.  Asymptomatic.  No changes at this time.

## 2011-06-14 NOTE — Assessment & Plan Note (Signed)
Check labs.  Determine if supplementation is required.

## 2011-06-14 NOTE — Assessment & Plan Note (Signed)
No current pain today.  May be related to muscle strain of her kyphotic posture.  Encouraged tylenol and heating pad as needed.  Family to alert if pain worsens.

## 2011-06-14 NOTE — Assessment & Plan Note (Signed)
Pt was not using Astepro for sx relief.  Restart.  Sample and prescription given.  Continue OTC antihistamine.  Stressed need for family to include this in daily pill box.  No evidence of infxn at this time.

## 2011-06-14 NOTE — Assessment & Plan Note (Signed)
Pt is most upset about her irregularity.  Unable to give specifics about her BM hx.  Will add daily miralax to improve regularity

## 2011-08-02 ENCOUNTER — Other Ambulatory Visit: Payer: Self-pay | Admitting: Cardiology

## 2011-11-12 ENCOUNTER — Other Ambulatory Visit: Payer: Self-pay | Admitting: Cardiology

## 2011-11-22 ENCOUNTER — Other Ambulatory Visit: Payer: Self-pay | Admitting: Cardiology

## 2011-12-21 ENCOUNTER — Other Ambulatory Visit: Payer: Self-pay | Admitting: Family Medicine

## 2011-12-21 MED ORDER — DONEPEZIL HCL 10 MG PO TABS: 10.0000 mg | ORAL_TABLET | Freq: Every day | ORAL | Status: DC

## 2011-12-21 NOTE — Telephone Encounter (Signed)
rx sent to pharmacy by e-script  

## 2012-02-07 ENCOUNTER — Other Ambulatory Visit: Payer: Self-pay | Admitting: Cardiology

## 2012-03-13 ENCOUNTER — Other Ambulatory Visit: Payer: Self-pay | Admitting: Cardiology

## 2012-03-14 NOTE — Telephone Encounter (Signed)
Refilled amlodipine,needs appointment

## 2012-03-29 ENCOUNTER — Encounter: Payer: Self-pay | Admitting: *Deleted

## 2012-03-30 ENCOUNTER — Ambulatory Visit (INDEPENDENT_AMBULATORY_CARE_PROVIDER_SITE_OTHER): Payer: Medicare Other | Admitting: Nurse Practitioner

## 2012-03-30 ENCOUNTER — Encounter: Payer: Self-pay | Admitting: Nurse Practitioner

## 2012-03-30 VITALS — BP 120/70 | HR 68 | Resp 18 | Ht 64.0 in | Wt 127.8 lb

## 2012-03-30 DIAGNOSIS — I7101 Dissection of thoracic aorta: Secondary | ICD-10-CM

## 2012-03-30 DIAGNOSIS — I351 Nonrheumatic aortic (valve) insufficiency: Secondary | ICD-10-CM

## 2012-03-30 DIAGNOSIS — I10 Essential (primary) hypertension: Secondary | ICD-10-CM

## 2012-03-30 DIAGNOSIS — I359 Nonrheumatic aortic valve disorder, unspecified: Secondary | ICD-10-CM

## 2012-03-30 MED ORDER — METOPROLOL TARTRATE 50 MG PO TABS: 50.0000 mg | ORAL_TABLET | Freq: Two times a day (BID) | ORAL | Status: AC

## 2012-03-30 MED ORDER — AMLODIPINE BESYLATE 5 MG PO TABS: 5.0000 mg | ORAL_TABLET | Freq: Every day | ORAL | Status: DC

## 2012-03-30 NOTE — Patient Instructions (Signed)
We have sent refills into your pharmacy for your Amlodipine and your Metoprolol.  These were sent in with a years supply.  Call our office when there are no more refills left.  Your physician recommends that you schedule a follow-up appointment in: 2 years with your primary Cardiologist or our PA.  You will need to call our office for this appointment.

## 2012-03-30 NOTE — Progress Notes (Signed)
Patient Name: Jamie [name] Date of Encounter: 03/30/2012  Primary Care Provider:  Neena Rhymes, MD, MD Primary Cardiologist:  Golden Circle, MD  Patient Profile  78 y/o female with h/o AI and type B Ao Dissection, who presents for f/u.  Problem List   Past Medical History  Diagnosis Date  . Osteoporosis   . Allergic rhinitis   . Dementia   . Osteoarthritis     hands  . IBS (irritable bowel syndrome)   . GERD (gastroesophageal reflux disease)   . Hypertension   . Palpitation     a. PAC's & PVC's by Holter - 2011  . Tortuous colon 2002  . Descending thoracic aortic dissection 04/24/2010    a. MRA 2011 - begins distal to left subclavian and extends throughout the full length of the Ao.  Marland Kitchen Aortic insufficiency 04/16/2010    a. Echo 2011 - mild to moderate AI, EF 60%.   No past surgical history on file.  Allergies  Allergies  Allergen Reactions  . Alendronate Sodium     REACTION: GI    HPI  78 y/o female with the above problem list.  She was last seen roughly 2 yrs ago @ which time she was discovered to have mild-mod AI and a type B Ao dissection.  She is not felt to be a surgical candidate and has been medically managed with bp contorl (norvasc, lopressor).  [facility] and has her BP checked every Monday - she says that it's always "good."  She is 120/70 today.  She exercises some @ [facility] and has no h/o chest pain, sob, pnd, orthopnea, dizziness, or syncope.   She denies any back pain.  Home Medications  Prior to Admission medications   Medication Sig Start Date End Date Taking? Authorizing Provider  amLODipine (NORVASC) 5 MG tablet Take 1 tablet (5 mg total) by mouth daily. 03/30/12  Yes Ok Anis, NP  Artificial Tear Solution (TEARS NATURALE II OP) Apply to eye daily.     Yes Historical Provider, MD  azelastine (ASTELIN) 137 MCG/SPRAY nasal spray Place 1-2 sprays into the nose 2 (two) times daily. Use in each nostril as directed 05/25/11  Yes  Sheliah Hatch, MD  Calcium 200 MG TABS Take by mouth 4 (four) times daily.     Yes Historical Provider, MD  Carboxymethylcell-Hypromellose (GENTEAL) 0.25-0.3 % GEL Apply to eye daily.     Yes Historical Provider, MD  Cholecalciferol (VITAMIN D) 2000 UNITS CAPS Take by mouth daily.     Yes Historical Provider, MD  Dentifrices (BIOTENE DRY MOUTH CARE DT) Place onto teeth daily.     Yes Historical Provider, MD  donepezil (ARICEPT) 10 MG tablet Take 1 tablet (10 mg total) by mouth daily. 12/21/11  Yes Sheliah Hatch, MD  loratadine (ALLERGY RELIEF) 10 MG tablet Take 10 mg by mouth daily.     Yes Historical Provider, MD  metoprolol (LOPRESSOR) 50 MG tablet Take 1 tablet (50 mg total) by mouth 2 (two) times daily. 03/30/12  Yes Ok Anis, NP  polycarbophil (FIBERCON) 625 MG tablet Take 1,250 mg by mouth daily.      Historical Provider, MD  RABEprazole (ACIPHEX) 20 MG tablet Take 20 mg by mouth daily.      Historical Provider, MD   Review of Systems No chest pain, sob, n, v, dizziness, syncope, edema, early satiety, dysuria, dark stools, blood in stools, diarrhea, rash/skin changes, fevers, chills, wt loss/gain.  Otherwise all systems reviewed and negative.   Physical Exam  Blood pressure 120/70, pulse 68, resp. rate 18, height 5\' 4"  (1.626 m), weight 127 lb 12.8 oz (57.97 kg).  General: Pleasant, NAD Psych: Normal affect. Neuro: Alert and oriented X 3. Moves all extremities spontaneously. HEENT: Normal except very HOH. Neck: Supple without bruits or JVD. Lungs:  Resp regular and unlabored, CTA. Heart: RRR no s3, s4.  Soft diast murmur rusb. Abdomen: Soft, non-tender, non-distended, BS + x 4.  Extremities: No clubbing, cyanosis or edema. DP/PT/Radials 2+ and equal bilaterally.  Accessory Clinical Findings  None  Assessment & Plan  1.  Type B Ao Dissection:  Pt is doing well with adequate BP control.  We will refill her bb and ccb today.  Pt and son are not interested in  additional imaging or ecg for that matter and are satisfied with ongoing conservative mgmt.  She will continue to have BP checked [facility].  Son wishes to have her f/u in 2 yrs.  2.  AI:  Mild to Mod by echo in 2011.  Not interested in repeat imaging.  3.  HTN:  Stable.  4.  Dispo:  F/u Dr. Shirlee Latch in 2 yrs or sooner if necessary - per pt/family wishes.   Nicolasa Ducking, NP 03/30/2012, 9:03 AM

## 2012-04-03 ENCOUNTER — Other Ambulatory Visit: Payer: Self-pay | Admitting: Cardiology

## 2012-04-04 ENCOUNTER — Telehealth: Payer: Self-pay | Admitting: Family Medicine

## 2012-04-04 NOTE — Telephone Encounter (Signed)
Refill: Donepezil hcl 10mg  tablet. Take 1 tablet by mouth daily. Qty 30. Last fill 03-06-12

## 2012-04-05 ENCOUNTER — Other Ambulatory Visit: Payer: Self-pay

## 2012-04-05 MED ORDER — DONEPEZIL HCL 10 MG PO TABS: 10.0000 mg | ORAL_TABLET | Freq: Every day | ORAL | Status: DC

## 2012-04-05 NOTE — Telephone Encounter (Signed)
rx sent to pharmacy by e-script for #30 per noted pt overdue for follow up visit Letter has been mailed to pt address noted in the chart to advise they are overdue for cpe/ov/labs and the pt needs to contact office to set up appt

## 2012-04-05 NOTE — Telephone Encounter (Signed)
Message left on Triage voicemail, patient has contacted [facility] for a refill on Aricept. Patient's son states the pharmacy is waiting to hear from our office.   I reviewed chart, rx filled yesterday #30 with an indication OV (CPX DUE). I called Mr.Hieronymus back and left message on voicemail informing him to call and schedule CPX. I then called the pharmacy to verify rx received, I spoke with Clydie Braun at the pharmacy and she stated rx was received and ready for pick-up.

## 2012-04-07 ENCOUNTER — Telehealth: Payer: Self-pay | Admitting: *Deleted

## 2012-04-07 NOTE — Telephone Encounter (Signed)
Called pt to advise the reclast verification form has been faxed to our office advising her coverage, noted as co-pay of 20% noted as $147.00 and the if the pt does want to proceed with the process to have the reclast she will need to schedule a BMP lab, left vm to have them call our office

## 2012-04-13 ENCOUNTER — Telehealth: Payer: Self-pay | Admitting: *Deleted

## 2012-04-13 NOTE — Telephone Encounter (Signed)
I have not had this conversation w/ pt or son (wife/daughter-in-law accompanied pt at the 2012 visit).  I see where he had this discussion w/ cardiology and they are in agreement.  Based on the fact that most of her problems are cardiac in nature, if they are ok w/ this f/u plan then i will also agree- with the understanding that she needs to come in w/ any concerns.  Please ask which meds need to be refilled as cards just provided some refills

## 2012-04-13 NOTE — Telephone Encounter (Signed)
Pt states that he and Dr Beverely Low have a agreement that Pt would not have to be seen for 2 years. Pt son notes that as a part of the agreement as long as Pt was seen by someone in the [facility] group including specialist Pt did not have to follow up with Dr Beverely Low specifically. Pt son is requesting that Pt med be filled until next year when he will follow up with Dr Beverely Low. Pt last OV was 05-2011. Please advise

## 2012-04-14 NOTE — Telephone Encounter (Signed)
.  left message to have patient return my call.  

## 2012-04-19 NOTE — Telephone Encounter (Signed)
Discuss with patient son. Pt son just wanted to make sure Aricept was refill. Last filled 04-05-12 #30.

## 2012-04-20 MED ORDER — DONEPEZIL HCL 10 MG PO TABS: 10.0000 mg | ORAL_TABLET | Freq: Every day | ORAL | Status: DC

## 2012-04-20 NOTE — Telephone Encounter (Signed)
rx sent to pharmacy by e-script Pt made aware 

## 2012-04-20 NOTE — Telephone Encounter (Signed)
Ok to continue to refill Aricept

## 2012-04-26 NOTE — Telephone Encounter (Signed)
BMW:[id number] pt to get an update on her reclast, spoke with pt daughter and son whom advised that the dentist strongly advised pt NOT to take this medication and thus declines to go further with the process to get the reclast,

## 2012-04-26 NOTE — Telephone Encounter (Signed)
Noted.  No further tx at this time.

## 2012-05-17 ENCOUNTER — Other Ambulatory Visit: Payer: Self-pay | Admitting: Cardiology

## 2012-05-18 ENCOUNTER — Other Ambulatory Visit: Payer: Self-pay | Admitting: Family Medicine

## 2012-05-18 NOTE — Telephone Encounter (Signed)
Please advise if ok to send, per noted on 04-13-12 the following concern that was addressed concerning aricept refill:  Pt states that he and Dr Beverely Low have a agreement that Pt would not have to be seen for 2 years

## 2012-05-18 NOTE — Telephone Encounter (Signed)
Refill Donepezil hcl 10 mg tablet #30, take one tablet by mouth daily, last fill 5.14.13, last ov 7.2.12

## 2012-05-19 MED ORDER — DONEPEZIL HCL 10 MG PO TABS: 10.0000 mg | ORAL_TABLET | Freq: Every day | ORAL | Status: DC

## 2012-05-19 NOTE — Telephone Encounter (Signed)
Refill done.  

## 2012-05-19 NOTE — Telephone Encounter (Signed)
Ok for #30, 12 refills

## 2012-05-28 ENCOUNTER — Emergency Department (HOSPITAL_COMMUNITY): Payer: Medicare Other

## 2012-05-28 ENCOUNTER — Encounter (HOSPITAL_COMMUNITY): Payer: Self-pay | Admitting: Family Medicine

## 2012-05-28 ENCOUNTER — Emergency Department (HOSPITAL_COMMUNITY)
Admission: EM | Admit: 2012-05-28 | Discharge: 2012-05-28 | Disposition: A | Discharge status: Skilled Nursing Facility | Payer: Medicare Other | Attending: Emergency Medicine | Admitting: Emergency Medicine

## 2012-05-28 DIAGNOSIS — F29 Unspecified psychosis not due to a substance or known physiological condition: Secondary | ICD-10-CM | POA: Diagnosis not present

## 2012-05-28 DIAGNOSIS — M810 Age-related osteoporosis without current pathological fracture: Secondary | ICD-10-CM | POA: Diagnosis not present

## 2012-05-28 DIAGNOSIS — R5383 Other fatigue: Secondary | ICD-10-CM | POA: Insufficient documentation

## 2012-05-28 DIAGNOSIS — K589 Irritable bowel syndrome without diarrhea: Secondary | ICD-10-CM | POA: Insufficient documentation

## 2012-05-28 DIAGNOSIS — F0390 Unspecified dementia without behavioral disturbance: Secondary | ICD-10-CM | POA: Insufficient documentation

## 2012-05-28 DIAGNOSIS — I6789 Other cerebrovascular disease: Secondary | ICD-10-CM | POA: Diagnosis not present

## 2012-05-28 DIAGNOSIS — R079 Chest pain, unspecified: Secondary | ICD-10-CM | POA: Diagnosis not present

## 2012-05-28 DIAGNOSIS — K219 Gastro-esophageal reflux disease without esophagitis: Secondary | ICD-10-CM | POA: Insufficient documentation

## 2012-05-28 DIAGNOSIS — R5381 Other malaise: Secondary | ICD-10-CM | POA: Diagnosis not present

## 2012-05-28 DIAGNOSIS — R4182 Altered mental status, unspecified: Secondary | ICD-10-CM | POA: Insufficient documentation

## 2012-05-28 DIAGNOSIS — F4489 Other dissociative and conversion disorders: Secondary | ICD-10-CM | POA: Diagnosis not present

## 2012-05-28 DIAGNOSIS — I10 Essential (primary) hypertension: Secondary | ICD-10-CM | POA: Insufficient documentation

## 2012-05-28 DIAGNOSIS — Z79899 Other long term (current) drug therapy: Secondary | ICD-10-CM | POA: Insufficient documentation

## 2012-05-28 DIAGNOSIS — G319 Degenerative disease of nervous system, unspecified: Secondary | ICD-10-CM | POA: Diagnosis not present

## 2012-05-28 LAB — CBC WITH DIFFERENTIAL/PLATELET
Basophils Relative: 1 % (ref 0–1)
Eosinophils Absolute: 0.1 10*3/uL (ref 0.0–0.7)
HCT: 39.8 % (ref 36.0–46.0)
Hemoglobin: 13.1 g/dL (ref 12.0–15.0)
MCH: 29.9 pg (ref 26.0–34.0)
MCHC: 32.9 g/dL (ref 30.0–36.0)
MCV: 90.9 fL (ref 78.0–100.0)
Monocytes Absolute: 0.6 10*3/uL (ref 0.1–1.0)
Monocytes Relative: 9 % (ref 3–12)

## 2012-05-28 LAB — URINALYSIS, ROUTINE W REFLEX MICROSCOPIC
Glucose, UA: NEGATIVE mg/dL
Leukocytes, UA: NEGATIVE
Protein, ur: NEGATIVE mg/dL
Specific Gravity, Urine: 1.017 (ref 1.005–1.030)
Urobilinogen, UA: 0.2 mg/dL (ref 0.0–1.0)

## 2012-05-28 LAB — COMPREHENSIVE METABOLIC PANEL
Alkaline Phosphatase: 45 U/L (ref 39–117)
BUN: 18 mg/dL (ref 6–23)
Chloride: 98 mEq/L (ref 96–112)
Creatinine, Ser: 0.62 mg/dL (ref 0.50–1.10)
GFR calc Af Amer: 89 mL/min — ABNORMAL LOW (ref >90)
Glucose, Bld: 110 mg/dL — ABNORMAL HIGH (ref 70–99)
Potassium: 3.7 mEq/L (ref 3.5–5.1)
Total Bilirubin: 0.4 mg/dL (ref 0.3–1.2)

## 2012-05-28 MED ORDER — SODIUM CHLORIDE 0.9 % IV SOLN
Freq: Once | INTRAVENOUS | Status: AC
  Administered 2012-05-28: 13:00:00 via INTRAVENOUS

## 2012-05-28 NOTE — ED Notes (Signed)
JYN:[id number]<AO> Expected date:05/28/12<BR> Expected time:11:40 AM<BR> Means of arrival:Ambulance<BR> Comments:<BR> 89 F. confusion

## 2012-05-28 NOTE — ED Notes (Signed)
Per EMS: Pt from [facility]. Reports onset of confusion x1-2 days.  States pt is normally independent but has become increasingly more confused over the past couple days. Reports hx of serum sodium deficiency. States that this is how she acts when that becomes a problem. VSS. 20 G IV to left hand.

## 2012-05-28 NOTE — ED Notes (Signed)
NT at bedside for discharge vitals, IV removal and helping pt get dressed. Pt's son states he will drive her back to snf.

## 2012-05-28 NOTE — ED Provider Notes (Signed)
History     CSN: [id number]  Arrival date & time 05/28/12  1145   First MD Initiated Contact with Patient 05/28/12 1149      Chief Complaint  Patient presents with  . Altered Mental Status    (Consider location/radiation/quality/duration/timing/severity/associated sxs/prior treatment) Patient is a 78 y.o. female presenting with altered mental status. The history is provided by the patient and a relative.  Altered Mental Status   patient here with altered mental status x2 days. Similar symptoms in the past associated with hyponatremia. No recent fever, vomiting, diarrhea. No recent head trauma. No cough or flulike illnesses. No change in medications. Symptoms have been progressively worse and they are made better with nothing and worse with nothing. No medications used prior to arrival  Past Medical History  Diagnosis Date  . Osteoporosis   . Allergic rhinitis   . Dementia   . Osteoarthritis     hands  . IBS (irritable bowel syndrome)   . GERD (gastroesophageal reflux disease)   . Hypertension   . Palpitation     a. PAC's & PVC's by Holter - 2011  . Tortuous colon 2002  . Descending thoracic aortic dissection 04/24/2010    a. MRA 2011 - begins distal to left subclavian and extends throughout the full length of the Ao.  Marland Kitchen Aortic insufficiency 04/16/2010    a. Echo 2011 - mild to moderate AI, EF 60%.    History reviewed. No pertinent past surgical history.  Family History  Problem Relation Age of Onset  . Diabetes Mother   . Coronary artery disease Father   . Hypertension Father   . Diabetes      sisters    History  Substance Use Topics  . Smoking status: Never Smoker   . Smokeless tobacco: Not on file  . Alcohol Use: No    OB History    Grav Para Term Preterm Abortions TAB SAB Ect Mult Living                  Review of Systems  Psychiatric/Behavioral: Positive for altered mental status.  All other systems reviewed and are negative.    Allergies    Alendronate sodium  Home Medications   Current Outpatient Rx  Name Route Sig Dispense Refill  . AMLODIPINE BESYLATE 5 MG PO TABS Oral Take 1 tablet (5 mg total) by mouth daily. 90 tablet 3  . AMLODIPINE BESYLATE 5 MG PO TABS  TAKE 1 TABLET EVERY DAY 30 tablet 0  . TEARS NATURALE II OP Ophthalmic Apply to eye daily.      . AZELASTINE HCL 137 MCG/SPRAY NA SOLN Nasal Place 1-2 sprays into the nose 2 (two) times daily. Use in each nostril as directed 30 mL 6  . CALCIUM 200 MG PO TABS Oral Take by mouth 4 (four) times daily.      Marland Kitchen CARBOXYMETHYLCELL-HYPROMELLOSE 0.25-0.3 % OP GEL Ophthalmic Apply to eye daily.      Marland Kitchen VITAMIN D 2000 UNITS PO CAPS Oral Take by mouth daily.      Marland Kitchen BIOTENE DRY MOUTH CARE DT dental Place onto teeth daily.      . DONEPEZIL HCL 10 MG PO TABS Oral Take 1 tablet (10 mg total) by mouth daily. 30 tablet 12  . LORATADINE 10 MG PO TABS Oral Take 10 mg by mouth daily.      Marland Kitchen METOPROLOL TARTRATE 50 MG PO TABS Oral Take 1 tablet (50 mg total) by mouth 2 (two) times daily.  180 tablet 3    Needs to schedule appointment for more refills 336 ...  . CALCIUM POLYCARBOPHIL 625 MG PO TABS Oral Take 1,250 mg by mouth daily.      Marland Kitchen RABEPRAZOLE SODIUM 20 MG PO TBEC Oral Take 20 mg by mouth daily.        BP 167/73  Pulse 72  Temp 98.2 F (36.8 C) (Oral)  Resp 18  SpO2 94%  Physical Exam  Nursing note and vitals reviewed. Constitutional: She appears well-developed and well-nourished. She appears lethargic.  Non-toxic appearance. No distress.  HENT:  Head: Normocephalic and atraumatic.  Eyes: Conjunctivae, EOM and lids are normal. Pupils are equal, round, and reactive to light.  Neck: Normal range of motion. Neck supple. No tracheal deviation present. No mass present.  Cardiovascular: Normal rate, regular rhythm and normal heart sounds.  Exam reveals no gallop.   No murmur heard. Pulmonary/Chest: Effort normal and breath sounds normal. No stridor. No respiratory distress. She  has no decreased breath sounds. She has no wheezes. She has no rhonchi. She has no rales.  Abdominal: Soft. Normal appearance and bowel sounds are normal. She exhibits no distension. There is no tenderness. There is no rebound and no CVA tenderness.  Musculoskeletal: Normal range of motion. She exhibits no edema and no tenderness.  Neurological: She has normal strength. She appears lethargic. No cranial nerve deficit or sensory deficit.  Skin: Skin is warm and dry. No abrasion and no rash noted.  Psychiatric: Her affect is blunt. She is slowed.    ED Course  Procedures (including critical care time)   Labs Reviewed  CBC WITH DIFFERENTIAL  URINALYSIS, ROUTINE W REFLEX MICROSCOPIC  URINE CULTURE  COMPREHENSIVE METABOLIC PANEL   No results found.   No diagnosis found.    MDM  Labs and x-rays noted. Patient examined she is more alert now. Patient does have a history of dementia and after speaking with her son she has had progressive symptoms over the past 3 weeks. Suspect that this is causing her current condition        Toy Baker, MD 05/28/12 1432

## 2012-05-28 NOTE — ED Notes (Signed)
Pt returned from CT scan.

## 2012-05-28 NOTE — ED Notes (Signed)
NT at bedside.  

## 2012-05-29 LAB — URINE CULTURE: Colony Count: NO GROWTH

## 2012-05-31 ENCOUNTER — Telehealth: Payer: Self-pay

## 2012-05-31 ENCOUNTER — Encounter: Payer: Self-pay | Admitting: Family Medicine

## 2012-05-31 ENCOUNTER — Ambulatory Visit (INDEPENDENT_AMBULATORY_CARE_PROVIDER_SITE_OTHER): Payer: Medicare Other | Admitting: Family Medicine

## 2012-05-31 VITALS — BP 125/72 | HR 92 | Temp 98.1°F | Ht 64.0 in | Wt 127.4 lb

## 2012-05-31 DIAGNOSIS — F068 Other specified mental disorders due to known physiological condition: Secondary | ICD-10-CM

## 2012-05-31 DIAGNOSIS — R4182 Altered mental status, unspecified: Secondary | ICD-10-CM | POA: Insufficient documentation

## 2012-05-31 NOTE — Patient Instructions (Addendum)
Continue to monitor her symptoms Try the night time snack- cheese and crackers, protein bars/shakes, etc would be great Call with any questions or concerns Hang in there!!!

## 2012-05-31 NOTE — Telephone Encounter (Signed)
Please note

## 2012-05-31 NOTE — Telephone Encounter (Signed)
Called pt son to clarify if the pt has went back to the ER per noted MD Tabori did advise for the pt to be seen in the ER, however pt son noted that she is feeling better today and no problems are noted today either, pt son noted that she will be in office at 10:30am today for her follow up, MD Beverely Low made aware verbally

## 2012-05-31 NOTE — Assessment & Plan Note (Signed)
New.  Son reports pt was hallucinating on Saturday morning.  ER workup was unrevealing and pt quickly returned to baseline.  May be having episodes of hypoglycemia as pt eats dinner at 5pm and doesn't eat again until 7-8am.  Discussed implementing bedtime snack.  Talked about appropriate options.  No need for additional w/u at this time as pt is back to 'normal' per family report.

## 2012-05-31 NOTE — Telephone Encounter (Signed)
[facility] Triage Call Report Triage Record Num: [id number] Operator: Elita Boone Patient Name: Jamie [name] Call Date & Time: 05/28/2012 10:57:33AM Patient Phone: [phone] PCP: Patient Gender: Female PCP Fax : Patient DOB: 1920-03-21 Practice Name: [facility] Reason for Call: Caller: [name]/Other; PCP: Other; CB#: [phone]; Call regarding Confusion onset , son is unsure of onset. Son instructed to call 911 for confusion. Protocol(s) Used: Confusion, Disorientation, Agitation Recommended Outcome per Protocol: Activate EMS 911 Reason for Outcome: New or worsening neuro deficits AND new or worsening confusion, disorientation or agitation Care Advice: ~ IMMEDIATE ACTION 05/28/2012 11:01:49AM Page 1 of 1 [id number]

## 2012-05-31 NOTE — Telephone Encounter (Signed)
Pt was treated in office today by MD Tabori, no need for ER assessment noted.

## 2012-05-31 NOTE — Telephone Encounter (Signed)
Agree w/ ER assessment 

## 2012-05-31 NOTE — Progress Notes (Signed)
  Subjective:    Patient ID: Jamie [name], female    DOB: [birth year], 78 y.o.   MRN: [id number]  HPI Mental status changes- went hospital on Saturday for altered mental status.  Pt has hx of dementia.  Son reports that since Sunday she is 'back to norm'.  Son reports sxs were similar to previous episodes when she had hyponatremia.  Labs were normal in ER.  UA was normal.  Son reports decreased intake the night prior.  Saturday was withdrawn and curled up in chair- minimally responsive to verbal stimuli.  Was hallucinating- seeing dead daughter.  'she was combining reality and fantasy'.  Currently living at [facility] in Independent Care but is in talks to move to Assisted Living.  Is taking meds via pill box that son sets out- son reports that when she returned from hospital she was 'a dose ahead'  Currently denies pain, no nausea/vomiting, HA.  Eating well.  'i feel fine'.   Review of Systems For ROS see HPI     Objective:   Physical Exam  Vitals reviewed. Constitutional: She appears well-developed and well-nourished. No distress.  HENT:  Head: Normocephalic and atraumatic.  Eyes: Conjunctivae and EOM are normal. Pupils are equal, round, and reactive to light.  Neck: Normal range of motion. Neck supple. No thyromegaly present.  Cardiovascular: Normal rate, regular rhythm, normal heart sounds and intact distal pulses.   No murmur heard. Pulmonary/Chest: Effort normal and breath sounds normal. No respiratory distress.  Abdominal: Soft. She exhibits no distension. There is no tenderness.  Musculoskeletal: She exhibits no edema.  Lymphadenopathy:    She has no cervical adenopathy.  Neurological: She is alert.       Very hard of hearing Not oriented to time or place- did not recall ER visit on Saturday  Skin: Skin is warm and dry.  Psychiatric: She has a normal mood and affect. Her behavior is normal.          Assessment & Plan:

## 2012-05-31 NOTE — Assessment & Plan Note (Signed)
Deteriorated.  Pt seems to have considerably declined since visit 1 yr ago.  Agree w/ son's decision to move her to assisted living to provide additional support.  Pt not able to remember big events such as trip to ER.  Still able to dress and bathe herself which given the state of her memory is remarkable.  Will continue to follow and assist as able.

## 2012-07-15 DIAGNOSIS — Z0279 Encounter for issue of other medical certificate: Secondary | ICD-10-CM

## 2012-07-26 ENCOUNTER — Telehealth: Payer: Self-pay | Admitting: *Deleted

## 2012-07-26 NOTE — Telephone Encounter (Signed)
Called Jamie [name] and advised the medication list received is noted as incorrect, the pt son will need to clarify, pt son in office at time documented changes and noted as not consist ant with current list, advised pt son to bring the prescription bottles in to office at facility to clarify all medications, santana given personal extension to call office with clarified information

## 2012-07-26 NOTE — Telephone Encounter (Signed)
Received incoming call concerning pt recent placement to [facility] and advised that the [facility] is different for pt medications than what she is actually taking, advised for the facility to fax in the corrected medications/dosages per pt son and that we will have MD Tabori review them when she returns to the office tomorrow, linda understood, will await incoming fax

## 2012-09-14 NOTE — Telephone Encounter (Signed)
Called [facility] to clarify they received the information faxed, asked to speak to Jamie [name] per admissions rep, left message to advise if any further assistance needed to contact our office however note the medication information was successfully faxed on 07-28-12.

## 2012-09-29 DIAGNOSIS — H6120 Impacted cerumen, unspecified ear: Secondary | ICD-10-CM

## 2012-09-29 DIAGNOSIS — I10 Essential (primary) hypertension: Secondary | ICD-10-CM | POA: Diagnosis not present

## 2012-09-29 DIAGNOSIS — H6090 Unspecified otitis externa, unspecified ear: Secondary | ICD-10-CM

## 2012-09-29 DIAGNOSIS — K644 Residual hemorrhoidal skin tags: Secondary | ICD-10-CM

## 2012-09-29 DIAGNOSIS — K5900 Constipation, unspecified: Secondary | ICD-10-CM

## 2012-09-29 DIAGNOSIS — E871 Hypo-osmolality and hyponatremia: Secondary | ICD-10-CM

## 2012-09-29 DIAGNOSIS — R269 Unspecified abnormalities of gait and mobility: Secondary | ICD-10-CM

## 2012-09-29 DIAGNOSIS — F0390 Unspecified dementia without behavioral disturbance: Secondary | ICD-10-CM | POA: Diagnosis not present

## 2012-09-29 DIAGNOSIS — H902 Conductive hearing loss, unspecified: Secondary | ICD-10-CM

## 2012-09-29 DIAGNOSIS — I714 Abdominal aortic aneurysm, without rupture: Secondary | ICD-10-CM | POA: Diagnosis not present

## 2012-09-29 DIAGNOSIS — I872 Venous insufficiency (chronic) (peripheral): Secondary | ICD-10-CM

## 2012-09-29 HISTORY — DX: Hypo-osmolality and hyponatremia: E87.1

## 2012-09-29 HISTORY — DX: Conductive hearing loss, unspecified: H90.2

## 2012-09-29 HISTORY — DX: Venous insufficiency (chronic) (peripheral): I87.2

## 2012-09-29 HISTORY — DX: Unspecified abnormalities of gait and mobility: R26.9

## 2012-09-29 HISTORY — DX: Unspecified otitis externa, unspecified ear: H60.90

## 2012-09-29 HISTORY — DX: Constipation, unspecified: K59.00

## 2012-09-29 HISTORY — DX: Residual hemorrhoidal skin tags: K64.4

## 2012-09-29 HISTORY — DX: Impacted cerumen, unspecified ear: H61.20

## 2012-09-30 DIAGNOSIS — I10 Essential (primary) hypertension: Secondary | ICD-10-CM | POA: Diagnosis not present

## 2012-10-04 DIAGNOSIS — D66 Hereditary factor VIII deficiency: Secondary | ICD-10-CM | POA: Diagnosis not present

## 2012-10-04 DIAGNOSIS — I10 Essential (primary) hypertension: Secondary | ICD-10-CM | POA: Diagnosis not present

## 2012-10-04 DIAGNOSIS — E039 Hypothyroidism, unspecified: Secondary | ICD-10-CM | POA: Diagnosis not present

## 2012-10-04 DIAGNOSIS — M818 Other osteoporosis without current pathological fracture: Secondary | ICD-10-CM | POA: Diagnosis not present

## 2012-10-04 DIAGNOSIS — D649 Anemia, unspecified: Secondary | ICD-10-CM | POA: Diagnosis not present

## 2012-10-05 DIAGNOSIS — M545 Low back pain, unspecified: Secondary | ICD-10-CM | POA: Diagnosis not present

## 2012-10-05 DIAGNOSIS — M25559 Pain in unspecified hip: Secondary | ICD-10-CM | POA: Diagnosis not present

## 2012-10-05 DIAGNOSIS — M533 Sacrococcygeal disorders, not elsewhere classified: Secondary | ICD-10-CM | POA: Diagnosis not present

## 2012-10-06 DIAGNOSIS — I10 Essential (primary) hypertension: Secondary | ICD-10-CM | POA: Diagnosis not present

## 2012-10-06 DIAGNOSIS — F0390 Unspecified dementia without behavioral disturbance: Secondary | ICD-10-CM | POA: Diagnosis not present

## 2012-10-06 DIAGNOSIS — R269 Unspecified abnormalities of gait and mobility: Secondary | ICD-10-CM | POA: Diagnosis not present

## 2012-10-06 DIAGNOSIS — M545 Low back pain, unspecified: Secondary | ICD-10-CM

## 2012-10-06 DIAGNOSIS — Z9181 History of falling: Secondary | ICD-10-CM | POA: Diagnosis not present

## 2012-10-06 HISTORY — DX: History of falling: Z91.81

## 2012-10-06 HISTORY — DX: Low back pain, unspecified: M54.50

## 2013-01-11 DIAGNOSIS — F0390 Unspecified dementia without behavioral disturbance: Secondary | ICD-10-CM | POA: Diagnosis not present

## 2013-01-12 DIAGNOSIS — R7989 Other specified abnormal findings of blood chemistry: Secondary | ICD-10-CM

## 2013-01-12 DIAGNOSIS — D649 Anemia, unspecified: Secondary | ICD-10-CM | POA: Diagnosis not present

## 2013-01-12 HISTORY — DX: Other specified abnormal findings of blood chemistry: R79.89

## 2013-01-12 HISTORY — DX: Anemia, unspecified: D64.9

## 2013-01-17 DIAGNOSIS — D649 Anemia, unspecified: Secondary | ICD-10-CM | POA: Diagnosis not present

## 2013-01-17 DIAGNOSIS — R7989 Other specified abnormal findings of blood chemistry: Secondary | ICD-10-CM | POA: Diagnosis not present

## 2013-01-17 LAB — CBC AND DIFFERENTIAL
HCT: 39 % (ref 36–46)
Hemoglobin: 13.4 g/dL (ref 12.0–16.0)

## 2013-01-17 LAB — BASIC METABOLIC PANEL
Potassium: 3.9 mmol/L (ref 3.4–5.3)
Sodium: 135 mmol/L — AB (ref 137–147)

## 2013-01-17 LAB — HEMOGLOBIN A1C: Hgb A1c MFr Bld: 5.9 % (ref 4.0–6.0)

## 2013-01-19 LAB — BASIC METABOLIC PANEL
BUN: 19 mg/dL (ref 4–21)
Glucose: 104 mg/dL

## 2013-02-06 DIAGNOSIS — F0390 Unspecified dementia without behavioral disturbance: Secondary | ICD-10-CM | POA: Diagnosis not present

## 2013-03-07 DIAGNOSIS — N390 Urinary tract infection, site not specified: Secondary | ICD-10-CM | POA: Diagnosis not present

## 2013-03-08 ENCOUNTER — Non-Acute Institutional Stay: Payer: Medicare Other | Admitting: Nurse Practitioner

## 2013-03-08 DIAGNOSIS — D649 Anemia, unspecified: Secondary | ICD-10-CM

## 2013-03-08 DIAGNOSIS — K5900 Constipation, unspecified: Secondary | ICD-10-CM | POA: Diagnosis not present

## 2013-03-08 DIAGNOSIS — I10 Essential (primary) hypertension: Secondary | ICD-10-CM

## 2013-03-08 DIAGNOSIS — F068 Other specified mental disorders due to known physiological condition: Secondary | ICD-10-CM | POA: Diagnosis not present

## 2013-03-08 DIAGNOSIS — R509 Fever, unspecified: Secondary | ICD-10-CM | POA: Diagnosis not present

## 2013-03-08 NOTE — Assessment & Plan Note (Signed)
IDA and chronic disease--takes Fe

## 2013-03-08 NOTE — Assessment & Plan Note (Signed)
[place]ived in [place] for [place], takes Aricept 10mg , gradual decline seen in the past a few months.

## 2013-03-08 NOTE — Assessment & Plan Note (Signed)
Stable on MiraLax daily.  

## 2013-03-08 NOTE — Assessment & Plan Note (Addendum)
Controlled on Amlodipine 5mg and Metoprolol 50mg bid.     

## 2013-03-08 NOTE — Progress Notes (Signed)
Patient ID: Jamie [name], female   DOB: [birth year], 79 y.o.   MRN: [id number] Chief Complaint  Patient presents with  . Medical Managment of Chronic Issues    fever    HPI:   Staff reported the patient has low grade T on and off, no cough, cannot get detailed HPI due to dementia, denied pain or dysuria.  Problem List Items Addressed This Visit     ICD-9-CM   DEMENTIA     [place]ived [place] for [place], takes Aricept 10mg , gradual decline seen in the past a few months.     Constipation - Primary     Stable on MiraLax daily    Hypertension     Controlled on Amlodipine 5mg  and Metoprolol 50mg  bid.     Anemia     IDA and chronic disease--takes Fe       Review of Systems:  Review of Systems  Constitutional: Positive for fever and malaise/fatigue. Negative for chills, weight loss and diaphoresis.  HENT: Positive for hearing loss. Negative for ear pain, congestion, sore throat and neck pain.   Eyes: Negative for pain, discharge and redness.  Respiratory: Negative for cough, sputum production, shortness of breath and wheezing.   Cardiovascular: Negative for chest pain, orthopnea, claudication, leg swelling and PND.  Gastrointestinal: Negative for heartburn, nausea, vomiting, abdominal pain, diarrhea, constipation and blood in stool.  Genitourinary: Positive for frequency. Negative for dysuria, urgency, hematuria and flank pain.  Musculoskeletal: Positive for back pain, joint pain and falls. Negative for myalgias.  Skin: Negative for itching and rash.  Neurological: Positive for weakness (generalized. ). Negative for dizziness, tingling, tremors, sensory change, speech change, focal weakness, seizures, loss of consciousness and headaches.  Endo/Heme/Allergies: Negative for environmental allergies and polydipsia. Does not bruise/bleed easily.  Psychiatric/Behavioral: Positive for memory loss. Negative for depression and hallucinations. The patient is not nervous/anxious and does not have insomnia.       Medications: Patient's Medications  New Prescriptions   No medications on file  Previous Medications   AMLODIPINE (NORVASC) 5 MG TABLET    Take 1 tablet (5 mg total) by mouth daily.   ARTIFICIAL TEAR SOLUTION (TEARS NATURALE II OP)    Apply to eye daily.     AZELASTINE (ASTELIN) 137 MCG/SPRAY NASAL SPRAY    Place 1-2 sprays into the nose 2 (two) times daily. Use in each nostril as directed   BISACODYL (DULCOLAX) 5 MG EC TABLET    Take 5 mg by mouth daily as needed. CONSTIPATION   CALCIUM CITRATE-VITAMIN D (CITRACAL+D) 315-200 MG-UNIT PER TABLET    Take 1 tablet by mouth 4 (four) times daily.   CARBOXYMETHYLCELL-HYPROMELLOSE (GENTEAL) 0.25-0.3 % GEL    Apply to eye daily.     CHOLECALCIFEROL (VITAMIN D) 2000 UNITS CAPS    Take by mouth daily.     DENTIFRICES (BIOTENE DRY MOUTH CARE DT)    Place onto teeth daily.     DONEPEZIL (ARICEPT) 10 MG TABLET    Take 1 tablet (10 mg total) by mouth daily.   LORATADINE (ALLERGY RELIEF) 10 MG TABLET    Take 10 mg by mouth daily.     METOPROLOL (LOPRESSOR) 50 MG TABLET    Take 1 tablet (50 mg total) by mouth 2 (two) times daily.  Modified Medications   No medications on file  Discontinued Medications   No medications on file     Physical Exam: Physical Exam  Constitutional: She appears well-developed and well-nourished. No distress.  HENT:  Head: Normocephalic and atraumatic.  Nose: Nose normal.  Mouth/Throat: Oropharynx is clear and moist. No oropharyngeal exudate.  Eyes: Conjunctivae and EOM are normal. Pupils are equal, round, and reactive to light. Right eye exhibits no discharge. Left eye exhibits no discharge. Scleral icterus is present.  Neck: Normal range of motion. Neck supple. No JVD present. No thyromegaly present.  Cardiovascular: Normal rate, regular rhythm and normal heart sounds.   No murmur heard. Pulmonary/Chest: Effort normal and breath sounds normal. No respiratory distress. She has no wheezes. She has no rales.   Abdominal: Soft. Bowel sounds are normal. She exhibits no distension. There is no tenderness.  Musculoskeletal: Normal range of motion. She exhibits tenderness. She exhibits no edema.  Lymphadenopathy:    She has no cervical adenopathy.  Neurological: She is alert. She has normal reflexes. She displays normal reflexes. No cranial nerve deficit. She exhibits normal muscle tone. Coordination normal.  Skin: Skin is warm and dry. No rash noted. She is not diaphoretic. No erythema.  Psychiatric: Her mood appears not anxious. Her affect is inappropriate. Her affect is not angry, not blunt and not labile. Her speech is delayed. Her speech is not rapid and/or pressured, not tangential and not slurred. She is slowed. She is not agitated, not aggressive, not hyperactive, not withdrawn and not actively hallucinating. Thought content is not paranoid and not delusional. Cognition and memory are impaired. She expresses impulsivity and inappropriate judgment. She does not exhibit a depressed mood. She exhibits abnormal recent memory. She exhibits normal remote memory. She is attentive.     There were no vitals filed for this visit.    Labs reviewed: Basic Metabolic Panel:  Recent Labs  16/10/96 1205  NA 136  K 3.7  CL 98  CO2 28  GLUCOSE 110*  BUN 18  CREATININE 0.62  CALCIUM 10.8*    Liver Function Tests:  Recent Labs  05/28/12 1205  AST 19  ALT 13  ALKPHOS 45  BILITOT 0.4  PROT 7.5  ALBUMIN 3.8    CBC:  Recent Labs  05/28/12 1205  WBC 6.0  NEUTROABS 3.9  HGB 13.1  HCT 39.8  MCV 90.9  PLT 156    Anemia Panel: No results found for this basename: IRON, FOLATE, VITAMINB12,  in the last 8760 hours  Significant Diagnostic Results:     Assessment/Plan Constipation Stable on MiraLax daily  Hypertension Controlled on Amlodipine 5mg  and Metoprolol 50mg  bid.   Anemia IDA and chronic disease--takes Fe  DEMENTIA [place]ived in [place] for [place], takes Aricept 10mg , gradual  decline seen in the past a few months.      Family/ staff Communication: safety   Goals of care: [place]    Labs/tests ordered CBC, CMP, TSH, CXR, UA C/S

## 2013-03-09 DIAGNOSIS — R109 Unspecified abdominal pain: Secondary | ICD-10-CM | POA: Diagnosis not present

## 2013-03-09 DIAGNOSIS — D649 Anemia, unspecified: Secondary | ICD-10-CM | POA: Diagnosis not present

## 2013-03-09 DIAGNOSIS — I10 Essential (primary) hypertension: Secondary | ICD-10-CM | POA: Diagnosis not present

## 2013-03-09 DIAGNOSIS — E039 Hypothyroidism, unspecified: Secondary | ICD-10-CM | POA: Diagnosis not present

## 2013-03-09 LAB — HEPATIC FUNCTION PANEL: AST: 12 U/L — AB (ref 13–35)

## 2013-03-09 LAB — BASIC METABOLIC PANEL
BUN: 17 mg/dL (ref 4–21)
Creatinine: 0.6 mg/dL (ref 0.5–1.1)
Glucose: 103 mg/dL
Potassium: 3.9 mmol/L (ref 3.4–5.3)

## 2013-03-09 LAB — CBC AND DIFFERENTIAL: WBC: 6.8 10^3/mL

## 2013-03-10 DIAGNOSIS — N390 Urinary tract infection, site not specified: Secondary | ICD-10-CM | POA: Diagnosis not present

## 2013-04-13 ENCOUNTER — Non-Acute Institutional Stay: Payer: Medicare Other | Admitting: Nurse Practitioner

## 2013-04-13 VITALS — BP 148/84 | HR 68 | Ht 63.0 in | Wt 127.0 lb

## 2013-04-13 DIAGNOSIS — M159 Polyosteoarthritis, unspecified: Secondary | ICD-10-CM | POA: Diagnosis not present

## 2013-04-13 DIAGNOSIS — I10 Essential (primary) hypertension: Secondary | ICD-10-CM | POA: Diagnosis not present

## 2013-04-13 DIAGNOSIS — D649 Anemia, unspecified: Secondary | ICD-10-CM | POA: Diagnosis not present

## 2013-04-13 DIAGNOSIS — M542 Cervicalgia: Secondary | ICD-10-CM | POA: Diagnosis not present

## 2013-04-13 DIAGNOSIS — M25519 Pain in unspecified shoulder: Secondary | ICD-10-CM | POA: Diagnosis not present

## 2013-04-13 DIAGNOSIS — M545 Low back pain: Secondary | ICD-10-CM | POA: Diagnosis not present

## 2013-04-13 DIAGNOSIS — F068 Other specified mental disorders due to known physiological condition: Secondary | ICD-10-CM

## 2013-04-13 DIAGNOSIS — K5900 Constipation, unspecified: Secondary | ICD-10-CM

## 2013-04-13 DIAGNOSIS — Z66 Do not resuscitate: Secondary | ICD-10-CM

## 2013-04-13 DIAGNOSIS — M546 Pain in thoracic spine: Secondary | ICD-10-CM | POA: Diagnosis not present

## 2013-04-13 NOTE — Assessment & Plan Note (Signed)
Stable on MiraLax daily.  

## 2013-04-13 NOTE — Progress Notes (Signed)
Patient ID: Jamie [name], female   DOB: [birth year], 79 y.o.   MRN: [id number] Code Status: DNR  Allergies  Allergen Reactions  . Alendronate Sodium     REACTION: GI    Chief Complaint  Patient presents with  . Medical Managment of Chronic Issues    blood pressure, dementia, CHF    HPI: Patient is a 79 y.o. female seen in the clinic at [facility] today for chronic medical condition management and right shoulder/shoulder blade/neck/middle back/lower back pain.  Problem List Items Addressed This Visit   DEMENTIA     [place] in [place] for care, takes Aricept 10mg , gradual decline seen in the past a few months.       HYPERTENSION, BENIGN ESSENTIAL     Controlled on Amlodipine 5mg  and Metoprolol 50mg  bid.       OSTEOARTHROSIS, GENERALIZED, MULTIPLE SITES - Primary     C/o pain in her right shoulder and shoulder blade(no decreased ROM of the right shoulder) and lower back.  Hx of middle of her spine vertebral body got out of alignment from falling on staircase in 1950--chronic on and off pain since the event. Will obtain X-ray cervical spine, thoracic spine, lumbar spine, and pelvis. Celebrex 200mg  daily for pain, Prilosec 20mg  daily for GI protection. UA to r/o UTI    Relevant Medications      acetaminophen (TYLENOL) 500 MG tablet   Constipation     Stable on MiraLax daily      Anemia     IDA and chronic disease--takes Fe. Hgb 11.6 03/09/13      Relevant Medications      ferrous sulfate 325 (65 FE) MG tablet   DNR (do not resuscitate)      Review of Systems:  Review of Systems  Constitutional: Positive for fever and malaise/fatigue. Negative for chills, weight loss and diaphoresis.  HENT: Positive for hearing loss. Negative for ear pain, congestion, sore throat and neck pain.   Eyes: Negative for pain, discharge and redness.  Respiratory: Negative for cough, sputum production, shortness of breath and wheezing.   Cardiovascular: Negative for chest pain, orthopnea,  claudication, leg swelling and PND.  Gastrointestinal: Negative for heartburn, nausea, vomiting, abdominal pain, diarrhea, constipation and blood in stool.  Genitourinary: Positive for frequency. Negative for dysuria, urgency, hematuria and flank pain.  Musculoskeletal: Positive for back pain, joint pain and falls. Negative for myalgias.  Skin: Negative for itching and rash.  Neurological: Positive for weakness (generalized. ). Negative for dizziness, tingling, tremors, sensory change, speech change, focal weakness, seizures, loss of consciousness and headaches.  Endo/Heme/Allergies: Negative for environmental allergies and polydipsia. Does not bruise/bleed easily.  Psychiatric/Behavioral: Positive for memory loss. Negative for depression and hallucinations. The patient is not nervous/anxious and does not have insomnia.      Past Medical History  Diagnosis Date  . Osteoporosis   . Allergic rhinitis   . Dementia   . Osteoarthritis     hands  . IBS (irritable bowel syndrome)   . GERD (gastroesophageal reflux disease)   . Hypertension   . Palpitation     a. PAC's & PVC's by Holter - 2011  . Tortuous colon 2002  . Descending thoracic aortic dissection 04/24/2010    a. MRA 2011 - begins distal to left subclavian and extends throughout the full length of the Ao.  Marland Kitchen Aortic insufficiency 04/16/2010    a. Echo 2011 - mild to moderate AI, EF 60%.  . Anemia, unspecified 01/12/2013  .  Other abnormal blood chemistry 01/12/2013  . Lumbago 10/06/2012  . Personal history of fall 10/06/2012  . Hyposmolality and/or hyponatremia 09/29/2012  . Otitis externa 09/29/2012  . Impacted cerumen 09/29/2012  . Unspecified conductive hearing loss 09/29/2012  . External hemorrhoids without mention of complication 09/29/2012  . Unspecified venous (peripheral) insufficiency 09/29/2012  . Unspecified constipation 09/29/2012  . Abnormality of gait 09/29/2012  . Congestive heart failure, unspecified 06/30/2003   Past  Surgical History  Procedure Laterality Date  . Dilation and curettage of uterus  1960  . Cataract extraction w/ intraocular lens implant     Social History:   reports that she has never smoked. She has never used smokeless tobacco. She reports that she does not drink alcohol or use illicit drugs.  Family History  Problem Relation Age of Onset  . Diabetes Mother   . Coronary artery disease Father   . Hypertension Father   . Diabetes      sisters    Medications: Patient's Medications  New Prescriptions   No medications on file  Previous Medications   ACETAMINOPHEN (TYLENOL) 500 MG TABLET    Take 500 mg by mouth every 6 (six) hours as needed for pain.   AMLODIPINE (NORVASC) 5 MG TABLET    Take 1 tablet (5 mg total) by mouth daily.   ARTIFICIAL TEAR SOLUTION (TEARS NATURALE II OP)    Apply 1 drop to eye daily. Both eyes twice daily   BISACODYL (DULCOLAX) 5 MG EC TABLET    Take 5 mg by mouth. Take 2 tablets daily as needed for constipation   CALCIUM CITRATE-VITAMIN D (CITRACAL+D) 315-200 MG-UNIT PER TABLET    Take 2 tablets by mouth. Take twice daily   CHOLECALCIFEROL (VITAMIN D) 2000 UNITS CAPS    Take by mouth daily.     DONEPEZIL (ARICEPT) 10 MG TABLET    Take 1 tablet (10 mg total) by mouth daily.   FERROUS SULFATE 325 (65 FE) MG TABLET    Take 325 mg by mouth daily with breakfast.   LORATADINE (ALLERGY RELIEF) 10 MG TABLET    Take 10 mg by mouth daily.     METOPROLOL (LOPRESSOR) 50 MG TABLET    Take 1 tablet (50 mg total) by mouth 2 (two) times daily.   MULTIPLE VITAMINS-MINERALS (CENTRUM PO)    Take 1 tablet by mouth 1 day or 1 dose.   POLYETHYLENE GLYCOL (MIRALAX / GLYCOLAX) PACKET    Take 17 g by mouth. Take every other day   RABEPRAZOLE (ACIPHEX) 20 MG TABLET    Take 20 mg by mouth daily.  Modified Medications   Modified Medication Previous Medication   AZELASTINE (ASTELIN) 137 MCG/SPRAY NASAL SPRAY azelastine (ASTELIN) 137 MCG/SPRAY nasal spray      Place 1 spray into the  nose. At bedtime    Place 1-2 sprays into the nose 2 (two) times daily. Use in each nostril as directed  Discontinued Medications   CARBOXYMETHYLCELL-HYPROMELLOSE (GENTEAL) 0.25-0.3 % GEL    Apply to eye daily.     DENTIFRICES (BIOTENE DRY MOUTH CARE DT)    Place onto teeth daily.       Physical Exam: Physical Exam  Constitutional: She appears well-developed and well-nourished. No distress.  HENT:  Head: Normocephalic and atraumatic.  Nose: Nose normal.  Mouth/Throat: Oropharynx is clear and moist. No oropharyngeal exudate.  Eyes: Conjunctivae and EOM are normal. Pupils are equal, round, and reactive to light. Right eye exhibits no discharge. Left eye exhibits no  discharge. Scleral icterus is present.  Neck: Normal range of motion. Neck supple. No JVD present. No thyromegaly present.  Cardiovascular: Normal rate, regular rhythm and normal heart sounds.   No murmur heard. Pulmonary/Chest: Effort normal and breath sounds normal. No respiratory distress. She has no wheezes. She has no rales.  Abdominal: Soft. Bowel sounds are normal. She exhibits no distension. There is no tenderness.  Musculoskeletal: Normal range of motion. She exhibits tenderness. She exhibits no edema.  Pain in the right shoulder/shoulder blade, neck, middle/lower back  Lymphadenopathy:    She has no cervical adenopathy.  Neurological: She is alert. She has normal reflexes. She displays normal reflexes. No cranial nerve deficit. She exhibits normal muscle tone. Coordination normal.  Skin: Skin is warm and dry. No rash noted. She is not diaphoretic. No erythema.  Psychiatric: Her mood appears not anxious. Her affect is inappropriate. Her affect is not angry, not blunt and not labile. Her speech is delayed. Her speech is not rapid and/or pressured, not tangential and not slurred. She is slowed. She is not agitated, not aggressive, not hyperactive, not withdrawn and not actively hallucinating. Thought content is not paranoid  and not delusional. Cognition and memory are impaired. She expresses impulsivity and inappropriate judgment. She does not exhibit a depressed mood. She exhibits abnormal recent memory. She exhibits normal remote memory. She is attentive.    Filed Vitals:   04/13/13 1533  BP: 148/84  Pulse: 68  Height: 5\' 3"  (1.6 m)  Weight: 127 lb (57.607 kg)      Labs reviewed: Basic Metabolic Panel:  Recent Labs  98/11/91 1205 01/17/13 01/19/13 03/09/13  NA 136 135* 135* 136*  K 3.7 3.9 4.4 3.9  CL 98  --   --   --   CO2 28  --   --   --   GLUCOSE 110*  --   --   --   BUN 18 15 19 17   CREATININE 0.62 0.7 0.7 0.6  CALCIUM 10.8*  --   --   --   TSH  --   --   --  1.12   Liver Function Tests:  Recent Labs  05/28/12 1205 03/09/13  AST 19 12*  ALT 13 8  ALKPHOS 45 54  BILITOT 0.4  --   PROT 7.5  --   ALBUMIN 3.8  --    No results found for this basename: LIPASE, AMYLASE,  in the last 8760 hours No results found for this basename: AMMONIA,  in the last 8760 hours CBC:  Recent Labs  05/28/12 1205 01/17/13 03/09/13  WBC 6.0 9.5 6.8  NEUTROABS 3.9  --   --   HGB 13.1 13.4 11.6*  HCT 39.8 39 35*  MCV 90.9  --   --   PLT 156 266 179   Lipid Panel: No results found for this basename: CHOL, HDL, LDLCALC, TRIG, CHOLHDL, LDLDIRECT,  in the last 8760 hours Anemia Panel: No results found for this basename: FOLATE, IRON, VITAMINB12,  in the last 8760 hours  Past Procedures:     Assessment/Plan HYPERTENSION, BENIGN ESSENTIAL Controlled on Amlodipine 5mg  and Metoprolol 50mg  bid.     Constipation Stable on MiraLax daily    Anemia IDA and chronic disease--takes Fe. Hgb 11.6 03/09/13    DEMENTIA Lived in [place] for [place], takes Aricept 10mg , gradual decline seen in the past a few months.     OSTEOARTHROSIS, GENERALIZED, MULTIPLE SITES C/o pain in her right shoulder and shoulder blade(no decreased  ROM of the right shoulder) and lower back.  Hx of middle of her spine  vertebral body got out of alignment from falling on staircase in 1950--chronic on and off pain since the event. Will obtain X-ray cervical spine, thoracic spine, lumbar spine, and pelvis. Celebrex 200mg  daily for pain, Prilosec 20mg  daily for GI protection. UA to r/o UTI    Family/ Staff Communication: none  Goals of Care: [place]  Labs/tests ordered: none

## 2013-04-13 NOTE — Assessment & Plan Note (Signed)
IDA and chronic disease--takes Fe. Hgb 11.6 03/09/13

## 2013-04-13 NOTE — Assessment & Plan Note (Signed)
Controlled on Amlodipine 5mg and Metoprolol 50mg bid.     

## 2013-04-13 NOTE — Assessment & Plan Note (Signed)
Lived in [place] for [place], takes Aricept 10mg, gradual decline seen in the past a few months.      

## 2013-04-13 NOTE — Assessment & Plan Note (Signed)
C/o pain in her right shoulder and shoulder blade(no decreased ROM of the right shoulder) and lower back.  Hx of middle of her spine vertebral body got out of alignment from falling on staircase in 1950--chronic on and off pain since the event. Will obtain X-ray cervical spine, thoracic spine, lumbar spine, and pelvis. Celebrex 200mg  daily for pain, Prilosec 20mg  daily for GI protection. UA to r/o UTI

## 2013-05-13 DIAGNOSIS — N390 Urinary tract infection, site not specified: Secondary | ICD-10-CM | POA: Diagnosis not present

## 2013-05-16 DIAGNOSIS — D649 Anemia, unspecified: Secondary | ICD-10-CM | POA: Diagnosis not present

## 2013-05-16 DIAGNOSIS — I10 Essential (primary) hypertension: Secondary | ICD-10-CM | POA: Diagnosis not present

## 2013-05-16 LAB — CBC AND DIFFERENTIAL
HCT: 43 % (ref 36–46)
Hemoglobin: 14.4 g/dL (ref 12.0–16.0)
Platelets: 162 10*3/uL (ref 150–399)
WBC: 7.3 10*3/mL

## 2013-05-16 LAB — BASIC METABOLIC PANEL
Glucose: 94 mg/dL
Sodium: 136 mmol/L — AB (ref 137–147)

## 2013-05-29 ENCOUNTER — Encounter: Payer: Self-pay | Admitting: Nurse Practitioner

## 2013-05-29 ENCOUNTER — Non-Acute Institutional Stay: Payer: Medicare Other | Admitting: Nurse Practitioner

## 2013-05-29 DIAGNOSIS — D649 Anemia, unspecified: Secondary | ICD-10-CM

## 2013-05-29 DIAGNOSIS — N390 Urinary tract infection, site not specified: Secondary | ICD-10-CM

## 2013-05-29 DIAGNOSIS — K5900 Constipation, unspecified: Secondary | ICD-10-CM | POA: Insufficient documentation

## 2013-05-29 DIAGNOSIS — I10 Essential (primary) hypertension: Secondary | ICD-10-CM | POA: Diagnosis not present

## 2013-05-29 DIAGNOSIS — F068 Other specified mental disorders due to known physiological condition: Secondary | ICD-10-CM

## 2013-05-29 DIAGNOSIS — M546 Pain in thoracic spine: Secondary | ICD-10-CM

## 2013-05-29 NOTE — Assessment & Plan Note (Signed)
IDA and chronic disease--takes Fe. Hgb 11.6 03/09/13-Hgb 14.4 05/16/13

## 2013-05-29 NOTE — Assessment & Plan Note (Signed)
Stable on MiraLax daily.  

## 2013-05-29 NOTE — Progress Notes (Signed)
Patient ID: Jamie [name], female   DOB: 1920-11-08, 79 y.o.   MRN: [id number] Code Status: DNR  Allergies  Allergen Reactions  . Alendronate Sodium     REACTION: GI    Chief Complaint  Patient presents with  . Medical Managment of Chronic Issues    confusion, s/p fall 05/14/13, UTI    HPI: Patient is a 79 y.o. female seen in the [facility] at [facility] today for UTI, altered mental status, s/s fall x2, HTN, back pain, dementia.  Problem List Items Addressed This Visit   Anemia     IDA and chronic disease--takes Fe. Hgb 11.6 03/09/13-Hgb 14.4 05/16/13        DEMENTIA     [place]ived in [place] for [place], takes Aricept 10mg , gradual decline seen in the past a few months.         Hypertension     Controlled on Amlodipine 5mg  and Metoprolol 50mg  bid.         Thoracic back pain     C/o pain in her right shoulder and shoulder blade(no decreased ROM of the right shoulder) and lower back.  Hx of middle of her spine vertebral body got out of alignment from falling on staircase in 1950--chronic on and off pain since the event. Will obtain X-ray cervical spine, thoracic spine, lumbar spine, and pelvis. Celebrex 200mg  daily for pain, Prilosec 20mg  daily for GI protection--pain is better controlled.       Unspecified constipation     Stable on MiraLax daily.     Urinary tract infection, site not specified - Primary     Urine culture 05/17/13 showed E. Coli and E. Cloacae >100,000c/ml, treated with Levofloxacin for total 7 days-her mentation has returned to her baseline and no further falling.         Review of Systems:  Review of Systems  Constitutional: Positive for fever and malaise/fatigue. Negative for chills, weight loss and diaphoresis.  HENT: Positive for hearing loss. Negative for ear pain, congestion, sore throat and neck pain.   Eyes: Negative for pain, discharge and redness.  Respiratory: Negative for cough, sputum production, shortness of breath and wheezing.    Cardiovascular: Negative for chest pain, orthopnea, claudication, leg swelling and PND.  Gastrointestinal: Negative for heartburn, nausea, vomiting, abdominal pain, diarrhea, constipation and blood in stool.  Genitourinary: Positive for frequency. Negative for dysuria, urgency, hematuria and flank pain.  Musculoskeletal: Positive for back pain, joint pain and falls. Negative for myalgias.  Skin: Negative for itching and rash.  Neurological: Positive for weakness (generalized. ). Negative for dizziness, tingling, tremors, sensory change, speech change, focal weakness, seizures, loss of consciousness and headaches.  Endo/Heme/Allergies: Negative for environmental allergies and polydipsia. Does not bruise/bleed easily.  Psychiatric/Behavioral: Positive for memory loss. Negative for depression and hallucinations. The patient is not nervous/anxious and does not have insomnia.      Past Medical History  Diagnosis Date  . Osteoporosis   . Allergic rhinitis   . Dementia   . Osteoarthritis     hands  . IBS (irritable bowel syndrome)   . GERD (gastroesophageal reflux disease)   . Hypertension   . Palpitation     a. PAC's & PVC's by Holter - 2011  . Tortuous colon 2002  . Descending thoracic aortic dissection 04/24/2010    a. MRA 2011 - begins distal to left subclavian and extends throughout the full length of the Ao.  Marland Kitchen Aortic insufficiency 04/16/2010    a. Echo 2011 -  mild to moderate AI, EF 60%.  . Anemia, unspecified 01/12/2013  . Other abnormal blood chemistry 01/12/2013  . Lumbago 10/06/2012  . Personal history of fall 10/06/2012  . Hyposmolality and/or hyponatremia 09/29/2012  . Otitis externa 09/29/2012  . Impacted cerumen 09/29/2012  . Unspecified conductive hearing loss 09/29/2012  . External hemorrhoids without mention of complication 09/29/2012  . Unspecified venous (peripheral) insufficiency 09/29/2012  . Unspecified constipation 09/29/2012  . Abnormality of gait 09/29/2012  .  Congestive heart failure, unspecified 06/30/2003   Past Surgical History  Procedure Laterality Date  . Dilation and curettage of uterus  1960  . Cataract extraction w/ intraocular lens implant     Social History:   reports that she has never smoked. She has never used smokeless tobacco. She reports that she does not drink alcohol or use illicit drugs.  Family History  Problem Relation Age of Onset  . Diabetes Mother   . Coronary artery disease Father   . Hypertension Father   . Diabetes      sisters    Medications: Patient's Medications  New Prescriptions   No medications on file  Previous Medications   ACETAMINOPHEN (TYLENOL) 500 MG TABLET    Take 500 mg by mouth every 6 (six) hours as needed for pain.   AMLODIPINE (NORVASC) 5 MG TABLET    Take 1 tablet (5 mg total) by mouth daily.   ARTIFICIAL TEAR SOLUTION (TEARS NATURALE II OP)    Apply 1 drop to eye daily. Both eyes twice daily   AZELASTINE (ASTELIN) 137 MCG/SPRAY NASAL SPRAY    Place 1 spray into the nose. At bedtime   BISACODYL (DULCOLAX) 5 MG EC TABLET    Take 5 mg by mouth. Take 2 tablets daily as needed for constipation   CALCIUM CITRATE-VITAMIN D (CITRACAL+D) 315-200 MG-UNIT PER TABLET    Take 2 tablets by mouth. Take twice daily   CHOLECALCIFEROL (VITAMIN D) 2000 UNITS CAPS    Take by mouth daily.     DONEPEZIL (ARICEPT) 10 MG TABLET    Take 1 tablet (10 mg total) by mouth daily.   FERROUS SULFATE 325 (65 FE) MG TABLET    Take 325 mg by mouth daily with breakfast.   LORATADINE (ALLERGY RELIEF) 10 MG TABLET    Take 10 mg by mouth daily.     METOPROLOL (LOPRESSOR) 50 MG TABLET    Take 1 tablet (50 mg total) by mouth 2 (two) times daily.   MULTIPLE VITAMINS-MINERALS (CENTRUM PO)    Take 1 tablet by mouth 1 day or 1 dose.   POLYETHYLENE GLYCOL (MIRALAX / GLYCOLAX) PACKET    Take 17 g by mouth. Take every other day   RABEPRAZOLE (ACIPHEX) 20 MG TABLET    Take 20 mg by mouth daily.  Modified Medications   No medications on  file  Discontinued Medications   No medications on file     Physical Exam: Physical Exam  Constitutional: She appears well-developed and well-nourished. No distress.  HENT:  Head: Normocephalic and atraumatic.  Nose: Nose normal.  Mouth/Throat: Oropharynx is clear and moist. No oropharyngeal exudate.  Eyes: Conjunctivae and EOM are normal. Pupils are equal, round, and reactive to light. Right eye exhibits no discharge. Left eye exhibits no discharge. Scleral icterus is present.  Neck: Normal range of motion. Neck supple. No JVD present. No thyromegaly present.  Cardiovascular: Normal rate, regular rhythm and normal heart sounds.   No murmur heard. Pulmonary/Chest: Effort normal and breath sounds normal.  No respiratory distress. She has no wheezes. She has no rales.  Abdominal: Soft. Bowel sounds are normal. She exhibits no distension. There is no tenderness.  Musculoskeletal: Normal range of motion. She exhibits tenderness. She exhibits no edema.  Pain in the right shoulder/shoulder blade, neck, middle/lower back  Lymphadenopathy:    She has no cervical adenopathy.  Neurological: She is alert. She has normal reflexes. She displays normal reflexes. No cranial nerve deficit. She exhibits normal muscle tone. Coordination normal.  Skin: Skin is warm and dry. No rash noted. She is not diaphoretic. No erythema.  Psychiatric: Her mood appears not anxious. Her affect is inappropriate. Her affect is not angry, not blunt and not labile. Her speech is delayed. Her speech is not rapid and/or pressured, not tangential and not slurred. She is slowed. She is not agitated, not aggressive, not hyperactive, not withdrawn and not actively hallucinating. Thought content is not paranoid and not delusional. Cognition and memory are impaired. She expresses impulsivity and inappropriate judgment. She does not exhibit a depressed mood. She exhibits abnormal recent memory. She exhibits normal remote memory. She is  attentive.    Filed Vitals:   05/29/13 1253  BP: 130/68  Pulse: 72  Temp: 97.3 F (36.3 C)  TempSrc: Tympanic  Resp: 20      Labs reviewed: Basic Metabolic Panel:  Recent Labs  03/47/42 03/09/13 05/16/13  NA 135* 136* 136*  K 4.4 3.9 4.1  BUN 19 17 21   CREATININE 0.7 0.6 0.7  TSH  --  1.12  --    Liver Function Tests:  Recent Labs  03/09/13  AST 12*  ALT 8  ALKPHOS 54   No results found for this basename: LIPASE, AMYLASE,  in the last 8760 hours No results found for this basename: AMMONIA,  in the last 8760 hours CBC:  Recent Labs  01/17/13 03/09/13 05/16/13  WBC 9.5 6.8 7.3  HGB 13.4 11.6* 14.4  HCT 39 35* 43  PLT 266 179 162   Lipid Panel: No results found for this basename: CHOL, HDL, LDLCALC, TRIG, CHOLHDL, LDLDIRECT,  in the last 8760 hours Anemia Panel: No results found for this basename: FOLATE, IRON, VITAMINB12,  in the last 8760 hours  Past Procedures:     Assessment/Plan Urinary tract infection, site not specified Urine culture 05/17/13 showed E. Coli and E. Cloacae >100,000c/ml, treated with Levofloxacin for total 7 days-her mentation has returned to her baseline and no further falling.    Unspecified constipation Stable on MiraLax daily.   Hypertension Controlled on Amlodipine 5mg  and Metoprolol 50mg  bid.       Anemia IDA and chronic disease--takes Fe. Hgb 11.6 03/09/13-Hgb 14.4 05/16/13      DEMENTIA Lived in [place] for care, takes Aricept 10mg , gradual decline seen in the past a few months.       Thoracic back pain C/o pain in her right shoulder and shoulder blade(no decreased ROM of the right shoulder) and lower back.  Hx of middle of her spine vertebral body got out of alignment from falling on staircase in 1950--chronic on and off pain since the event. Will obtain X-ray cervical spine, thoracic spine, lumbar spine, and pelvis. Celebrex 200mg  daily for pain, Prilosec 20mg  daily for GI protection--pain is better controlled.        Family/ Staff Communication: observe the patient.   Goals of Care: [place]  Labs/tests ordered: none

## 2013-05-29 NOTE — Assessment & Plan Note (Signed)
Controlled on Amlodipine 5mg and Metoprolol 50mg bid.     

## 2013-05-29 NOTE — Assessment & Plan Note (Signed)
Urine culture 05/17/13 showed E. Coli and E. Cloacae >100,000c/ml, treated with Levofloxacin for total 7 days-her mentation has returned to her baseline and no further falling.

## 2013-05-29 NOTE — Assessment & Plan Note (Signed)
[place]ived in [place] for [place], takes Aricept 10mg , gradual decline seen in the past a few months.

## 2013-05-29 NOTE — Assessment & Plan Note (Signed)
C/o pain in her right shoulder and shoulder blade(no decreased ROM of the right shoulder) and lower back.  Hx of middle of her spine vertebral body got out of alignment from falling on staircase in 1950--chronic on and off pain since the event. Will obtain X-ray cervical spine, thoracic spine, lumbar spine, and pelvis. Celebrex 200mg daily for pain, Prilosec 20mg daily for GI protection--pain is better controlled.      

## 2013-06-17 DIAGNOSIS — N390 Urinary tract infection, site not specified: Secondary | ICD-10-CM | POA: Diagnosis not present

## 2013-06-30 DIAGNOSIS — N390 Urinary tract infection, site not specified: Secondary | ICD-10-CM | POA: Diagnosis not present

## 2013-07-03 ENCOUNTER — Non-Acute Institutional Stay: Payer: Medicare Other | Admitting: Nurse Practitioner

## 2013-07-03 ENCOUNTER — Encounter: Payer: Self-pay | Admitting: Nurse Practitioner

## 2013-07-03 DIAGNOSIS — K5900 Constipation, unspecified: Secondary | ICD-10-CM

## 2013-07-03 DIAGNOSIS — I10 Essential (primary) hypertension: Secondary | ICD-10-CM

## 2013-07-03 DIAGNOSIS — F068 Other specified mental disorders due to known physiological condition: Secondary | ICD-10-CM

## 2013-07-03 DIAGNOSIS — K219 Gastro-esophageal reflux disease without esophagitis: Secondary | ICD-10-CM

## 2013-07-03 DIAGNOSIS — M159 Polyosteoarthritis, unspecified: Secondary | ICD-10-CM

## 2013-07-03 DIAGNOSIS — D649 Anemia, unspecified: Secondary | ICD-10-CM

## 2013-07-03 DIAGNOSIS — N390 Urinary tract infection, site not specified: Secondary | ICD-10-CM

## 2013-07-03 NOTE — Assessment & Plan Note (Signed)
Controlled on Amlodipine 5mg and Metoprolol 50mg bid.     

## 2013-07-03 NOTE — Progress Notes (Signed)
Patient ID: Jamie [name], female   DOB: 01-Apr-1920, 79 y.o.   MRN: [id number] Code Status: DNR  Allergies  Allergen Reactions  . Alendronate Sodium     REACTION: GI    Chief Complaint  Patient presents with  . Medical Managment of Chronic Issues    worsened confusion.     HPI: Patient is a 79 y.o. female seen in the [facility] at [facility] today for worsened confusion and other chronic medical conditions.  Problem List Items Addressed This Visit   Anemia     IDA and chronic disease--takes Fe. Hgb 11.6 03/09/13-Hgb 14.4 05/16/13, repeat CBC and may dc Iron if Hgb wnl          DEMENTIA     Lived in [place] for care, takes Aricept 10mg , gradual decline seen in the past a few months. Staff has reported the patient's confused has worsened-UA showed multiple bacterial-will recollect UA and C/S and will start Cipro 25mg  bid empirically per urine culture 2/222 and 6/25  since the patient had similar presentation at onset of UTIs 01/14/13 and 05/17/13          GERD (gastroesophageal reflux disease)     Stable on Prilosec 20mg  daily.     Hypertension     Controlled on Amlodipine 5mg  and Metoprolol 50mg  bid.           OSTEOARTHROSIS, GENERALIZED, MULTIPLE SITES     C/o pain in her right shoulder and shoulder blade(no decreased ROM of the right shoulder) and lower back.  Hx of middle of her spine vertebral body got out of alignment from falling on staircase in 1950--chronic on and off pain since the event. Will obtain X-ray cervical spine, thoracic spine, lumbar spine, and pelvis. Celebrex 200mg  daily for pain, Prilosec 20mg  daily for GI protection--pain is better controlled.         Unspecified constipation - Primary     Stable on MiraLax qod      Urinary tract infection, site not specified     Presumed clinically per patient's worsened confusion. Cipro empirically per urine culture 01/14/13 and 05/17/13 after obtain Cath UA and C/S       Review of Systems:  Review  of Systems  Constitutional: Positive for fever and malaise/fatigue. Negative for chills, weight loss and diaphoresis.  HENT: Positive for hearing loss. Negative for ear pain, congestion, sore throat and neck pain.   Eyes: Negative for pain, discharge and redness.  Respiratory: Negative for cough, sputum production, shortness of breath and wheezing.   Cardiovascular: Negative for chest pain, orthopnea, claudication, leg swelling and PND.  Gastrointestinal: Negative for heartburn, nausea, vomiting, abdominal pain, diarrhea, constipation and blood in stool.  Genitourinary: Positive for frequency. Negative for dysuria, urgency, hematuria and flank pain.  Musculoskeletal: Positive for back pain, joint pain and falls. Negative for myalgias.  Skin: Negative for itching and rash.  Neurological: Positive for weakness (generalized. ). Negative for dizziness, tingling, tremors, sensory change, speech change, focal weakness, seizures, loss of consciousness and headaches.  Endo/Heme/Allergies: Negative for environmental allergies and polydipsia. Does not bruise/bleed easily.  Psychiatric/Behavioral: Positive for memory loss. Negative for depression and hallucinations. The patient is not nervous/anxious and does not have insomnia.      Past Medical History  Diagnosis Date  . Osteoporosis   . Allergic rhinitis   . Dementia   . Osteoarthritis     hands  . IBS (irritable bowel syndrome)   . GERD (gastroesophageal reflux disease)   .  Hypertension   . Palpitation     a. PAC's & PVC's by Holter - 2011  . Tortuous colon 2002  . Descending thoracic aortic dissection 04/24/2010    a. MRA 2011 - begins distal to left subclavian and extends throughout the full length of the Ao.  Marland Kitchen Aortic insufficiency 04/16/2010    a. Echo 2011 - mild to moderate AI, EF 60%.  . Anemia, unspecified 01/12/2013  . Other abnormal blood chemistry 01/12/2013  . Lumbago 10/06/2012  . Personal history of fall 10/06/2012  .  Hyposmolality and/or hyponatremia 09/29/2012  . Otitis externa 09/29/2012  . Impacted cerumen 09/29/2012  . Unspecified conductive hearing loss 09/29/2012  . External hemorrhoids without mention of complication 09/29/2012  . Unspecified venous (peripheral) insufficiency 09/29/2012  . Unspecified constipation 09/29/2012  . Abnormality of gait 09/29/2012  . Congestive heart failure, unspecified 06/30/2003   Past Surgical History  Procedure Laterality Date  . Dilation and curettage of uterus  1960  . Cataract extraction w/ intraocular lens implant     Social History:   reports that she has never smoked. She has never used smokeless tobacco. She reports that she does not drink alcohol or use illicit drugs.  Family History  Problem Relation Age of Onset  . Diabetes Mother   . Coronary artery disease Father   . Hypertension Father   . Diabetes      sisters    Medications: Patient's Medications  New Prescriptions   No medications on file  Previous Medications   ACETAMINOPHEN (TYLENOL) 500 MG TABLET    Take 500 mg by mouth every 6 (six) hours as needed for pain.   AMLODIPINE (NORVASC) 5 MG TABLET    Take 1 tablet (5 mg total) by mouth daily.   ARTIFICIAL TEAR SOLUTION (TEARS NATURALE II OP)    Apply 1 drop to eye daily. Both eyes twice daily   AZELASTINE (ASTELIN) 137 MCG/SPRAY NASAL SPRAY    Place 1 spray into the nose. At bedtime   BISACODYL (DULCOLAX) 5 MG EC TABLET    Take 5 mg by mouth. Take 2 tablets daily as needed for constipation   CALCIUM CITRATE-VITAMIN D (CITRACAL+D) 315-200 MG-UNIT PER TABLET    Take 2 tablets by mouth. Take twice daily   CHOLECALCIFEROL (VITAMIN D) 2000 UNITS CAPS    Take by mouth daily.     DONEPEZIL (ARICEPT) 10 MG TABLET    Take 1 tablet (10 mg total) by mouth daily.   FERROUS SULFATE 325 (65 FE) MG TABLET    Take 325 mg by mouth daily with breakfast.   LORATADINE (ALLERGY RELIEF) 10 MG TABLET    Take 10 mg by mouth daily.     METOPROLOL (LOPRESSOR) 50 MG  TABLET    Take 1 tablet (50 mg total) by mouth 2 (two) times daily.   MULTIPLE VITAMINS-MINERALS (CENTRUM PO)    Take 1 tablet by mouth 1 day or 1 dose.   POLYETHYLENE GLYCOL (MIRALAX / GLYCOLAX) PACKET    Take 17 g by mouth. Take every other day   RABEPRAZOLE (ACIPHEX) 20 MG TABLET    Take 20 mg by mouth daily.  Modified Medications   No medications on file  Discontinued Medications   No medications on file     Physical Exam: Physical Exam  Constitutional: She appears well-developed and well-nourished. No distress.  HENT:  Head: Normocephalic and atraumatic.  Nose: Nose normal.  Mouth/Throat: Oropharynx is clear and moist. No oropharyngeal exudate.  Eyes: Conjunctivae  and EOM are normal. Pupils are equal, round, and reactive to light. Right eye exhibits no discharge. Left eye exhibits no discharge. No scleral icterus.  Neck: Normal range of motion. Neck supple. No JVD present. No thyromegaly present.  Cardiovascular: Normal rate, regular rhythm and normal heart sounds.   No murmur heard. Pulmonary/Chest: Effort normal and breath sounds normal. No respiratory distress. She has no wheezes. She has no rales.  Abdominal: Soft. Bowel sounds are normal. She exhibits no distension. There is no tenderness.  Musculoskeletal: Normal range of motion. She exhibits tenderness. She exhibits no edema.  Pain in the right shoulder/shoulder blade, neck, middle/lower back  Lymphadenopathy:    She has no cervical adenopathy.  Neurological: She is alert. She has normal reflexes. She displays normal reflexes. No cranial nerve deficit. She exhibits normal muscle tone. Coordination normal.  Skin: Skin is warm and dry. No rash noted. She is not diaphoretic. No erythema.  Psychiatric: Her mood appears not anxious. Her affect is inappropriate. Her affect is not angry, not blunt and not labile. Her speech is delayed. Her speech is not rapid and/or pressured, not tangential and not slurred. She is slowed. She is  not agitated, not aggressive, not hyperactive, not withdrawn and not actively hallucinating. Thought content is not paranoid and not delusional. Cognition and memory are impaired. She expresses impulsivity and inappropriate judgment. She does not exhibit a depressed mood. She exhibits abnormal recent memory. She exhibits normal remote memory. She is attentive.    Filed Vitals:   07/03/13 1253  BP: 130/60  Pulse: 60  Temp: 97.1 F (36.2 C)  TempSrc: Tympanic  Resp: 24      Labs reviewed: Basic Metabolic Panel:  Recent Labs  16/10/96 03/09/13 05/16/13  NA 135* 136* 136*  K 4.4 3.9 4.1  BUN 19 17 21   CREATININE 0.7 0.6 0.7  TSH  --  1.12  --    Liver Function Tests:  Recent Labs  03/09/13  AST 12*  ALT 8  ALKPHOS 54    CBC:  Recent Labs  01/17/13 03/09/13 05/16/13  WBC 9.5 6.8 7.3  HGB 13.4 11.6* 14.4  HCT 39 35* 43  PLT 266 179 162    Past Procedures: 04/13/13 X-ray R scapula+shoulder, cervical spine, lumbar spine, thoracic spine: disc disease L3-L5. Old upper thoracic vertebral body compression fractures.    Assessment/Plan Unspecified constipation Stable on MiraLax qod    GERD (gastroesophageal reflux disease) Stable on Prilosec 20mg  daily.   Hypertension Controlled on Amlodipine 5mg  and Metoprolol 50mg  bid.         Anemia IDA and chronic disease--takes Fe. Hgb 11.6 03/09/13-Hgb 14.4 05/16/13, repeat CBC and may dc Iron if Hgb wnl        OSTEOARTHROSIS, GENERALIZED, MULTIPLE SITES C/o pain in her right shoulder and shoulder blade(no decreased ROM of the right shoulder) and lower back.  Hx of middle of her spine vertebral body got out of alignment from falling on staircase in 1950--chronic on and off pain since the event. Will obtain X-ray cervical spine, thoracic spine, lumbar spine, and pelvis. Celebrex 200mg  daily for pain, Prilosec 20mg  daily for GI protection--pain is better controlled.       DEMENTIA Lived in [place] for care, takes  Aricept 10mg , gradual decline seen in the past a few months. Staff has reported the patient's confused has worsened-UA showed multiple bacterial-will recollect UA and C/S and will start Cipro 25mg  bid empirically per urine culture 2/222 and 6/25  since the patient  had similar presentation at onset of UTIs 01/14/13 and 05/17/13        Urinary tract infection, site not specified Presumed clinically per patient's worsened confusion. Cipro empirically per urine culture 01/14/13 and 05/17/13 after obtain Cath UA and C/S    Family/ Staff Communication: observe the patient.   Goals of Care: [place]  Labs/tests ordered: None CBC and Cath UA C/S

## 2013-07-03 NOTE — Assessment & Plan Note (Signed)
Stable on Prilosec 20 mg daily. 

## 2013-07-03 NOTE — Assessment & Plan Note (Signed)
C/o pain in her right shoulder and shoulder blade(no decreased ROM of the right shoulder) and lower back.  Hx of middle of her spine vertebral body got out of alignment from falling on staircase in 1950--chronic on and off pain since the event. Will obtain X-ray cervical spine, thoracic spine, lumbar spine, and pelvis. Celebrex 200mg  daily for pain, Prilosec 20mg  daily for GI protection--pain is better controlled.

## 2013-07-03 NOTE — Assessment & Plan Note (Addendum)
Lived in [place] for care, takes Aricept 10mg , gradual decline seen in the past a few months. Staff has reported the patient's confused has worsened-UA showed multiple bacterial-will recollect UA and C/S and will start Cipro 25mg  bid empirically per urine culture 2/222 and 6/25  since the patient had similar presentation at onset of UTIs 01/14/13 and 05/17/13

## 2013-07-03 NOTE — Assessment & Plan Note (Signed)
Stable on MiraLax qod                 

## 2013-07-03 NOTE — Assessment & Plan Note (Signed)
Presumed clinically per patient's worsened confusion. Cipro empirically per urine culture 01/14/13 and 05/17/13 after obtain Cath UA and C/S

## 2013-07-03 NOTE — Assessment & Plan Note (Addendum)
IDA and chronic disease--takes Fe. Hgb 11.6 03/09/13-Hgb 14.4 05/16/13, repeat CBC and may dc Iron if Hgb wnl

## 2013-07-04 DIAGNOSIS — D649 Anemia, unspecified: Secondary | ICD-10-CM | POA: Diagnosis not present

## 2013-07-04 DIAGNOSIS — N390 Urinary tract infection, site not specified: Secondary | ICD-10-CM | POA: Diagnosis not present

## 2013-07-04 LAB — CBC AND DIFFERENTIAL
Platelets: 151 10*3/uL (ref 150–399)
WBC: 5.7 10^3/mL

## 2013-07-06 ENCOUNTER — Non-Acute Institutional Stay: Payer: Medicare Other | Admitting: Nurse Practitioner

## 2013-07-06 ENCOUNTER — Encounter: Payer: Self-pay | Admitting: Nurse Practitioner

## 2013-07-06 VITALS — BP 120/74 | HR 62 | Ht 63.0 in | Wt 131.0 lb

## 2013-07-06 DIAGNOSIS — M546 Pain in thoracic spine: Secondary | ICD-10-CM

## 2013-07-06 DIAGNOSIS — I10 Essential (primary) hypertension: Secondary | ICD-10-CM

## 2013-07-06 DIAGNOSIS — K5900 Constipation, unspecified: Secondary | ICD-10-CM

## 2013-07-06 DIAGNOSIS — F068 Other specified mental disorders due to known physiological condition: Secondary | ICD-10-CM | POA: Diagnosis not present

## 2013-07-06 NOTE — Progress Notes (Signed)
Patient ID: Jamie [name], female   DOB: [birth year], 79 y.o.   MRN: [id number] Code Status: DNR  Allergies  Allergen Reactions  . Alendronate Sodium     REACTION: GI    Chief Complaint  Patient presents with  . Medical Managment of Chronic Issues    confusion, dementia, blood pressure, CHF    HPI: Patient is a 79 y.o. female seen in the clinic at [facility] today for evaluation of her increased confusion and other chronic medical conditions.  Problem List Items Addressed This Visit   DEMENTIA - Primary     Lived in [place] for care, takes Aricept 10mg , gradual decline seen in the past a few months. Staff has reported the patient's confused has worsened-UA shows no growth 07/06/13. The patient admitted her memory problems. Will repeat MMSE,. Refer to cognitive therapy. Adding Namenda 7mg  daily x1wk, the 14mg  daily for 1 wk, then 21mg  daily for 1wk, the 28mg  daily. The patient also c/o not sleeping well at night but declined sleeping aid.             HYPERTENSION, BENIGN ESSENTIAL     Controlled on Amlodipine 5mg  and Metoprolol 50mg  bid.           Thoracic back pain     C/o pain in her right shoulder and shoulder blade(no decreased ROM of the right shoulder) and lower back.  Hx of middle of her spine vertebral body got out of alignment from falling on staircase in 1950--chronic on and off pain since the event. Celebrex 200mg  daily for pain, Prilosec 20mg  daily for GI protection--pain is better controlled.           Relevant Medications      celecoxib (CELEBREX) 200 MG capsule   Unspecified constipation     Stable on MiraLax qod           Review of Systems:  Review of Systems  Constitutional: Negative for fever, chills, weight loss, malaise/fatigue and diaphoresis.  HENT: Positive for hearing loss. Negative for ear pain, congestion, sore throat and neck pain.   Eyes: Negative for pain, discharge and redness.  Respiratory: Negative for cough, sputum  production, shortness of breath and wheezing.   Cardiovascular: Negative for chest pain, orthopnea, claudication, leg swelling and PND.  Gastrointestinal: Negative for heartburn, nausea, vomiting, abdominal pain, diarrhea, constipation and blood in stool.  Genitourinary: Positive for frequency. Negative for dysuria, urgency, hematuria and flank pain.  Musculoskeletal: Positive for back pain, joint pain and falls. Negative for myalgias.  Skin: Negative for itching and rash.  Neurological: Positive for weakness (generalized. ). Negative for dizziness, tingling, tremors, sensory change, speech change, focal weakness, seizures, loss of consciousness and headaches.  Endo/Heme/Allergies: Negative for environmental allergies and polydipsia. Does not bruise/bleed easily.  Psychiatric/Behavioral: Positive for memory loss. Negative for depression and hallucinations. The patient is not nervous/anxious and does not have insomnia.      Past Medical History  Diagnosis Date  . Osteoporosis   . Allergic rhinitis   . Dementia   . Osteoarthritis     hands  . IBS (irritable bowel syndrome)   . GERD (gastroesophageal reflux disease)   . Hypertension   . Palpitation     a. PAC's & PVC's by Holter - 2011  . Tortuous colon 2002  . Descending thoracic aortic dissection 04/24/2010    a. MRA 2011 - begins distal to left subclavian and extends throughout the full length of the Ao.  Marland Kitchen Aortic insufficiency  04/16/2010    a. Echo 2011 - mild to moderate AI, EF 60%.  . Anemia, unspecified 01/12/2013  . Other abnormal blood chemistry 01/12/2013  . Lumbago 10/06/2012  . Personal history of fall 10/06/2012  . Hyposmolality and/or hyponatremia 09/29/2012  . Otitis externa 09/29/2012  . Impacted cerumen 09/29/2012  . Unspecified conductive hearing loss 09/29/2012  . External hemorrhoids without mention of complication 09/29/2012  . Unspecified venous (peripheral) insufficiency 09/29/2012  . Unspecified constipation 09/29/2012   . Abnormality of gait 09/29/2012  . Congestive heart failure, unspecified 06/30/2003   Past Surgical History  Procedure Laterality Date  . Dilation and curettage of uterus  1960  . Cataract extraction w/ intraocular lens implant     Social History:   reports that she has never smoked. She has never used smokeless tobacco. She reports that she does not drink alcohol or use illicit drugs.  Family History  Problem Relation Age of Onset  . Diabetes Mother   . Coronary artery disease Father   . Hypertension Father   . Diabetes      sisters    Medications: Patient's Medications  New Prescriptions   No medications on file  Previous Medications   ACETAMINOPHEN (TYLENOL) 500 MG TABLET    Take 500 mg by mouth every 6 (six) hours as needed for pain.   AMLODIPINE (NORVASC) 5 MG TABLET    Take 1 tablet (5 mg total) by mouth daily.   ARTIFICIAL TEAR SOLUTION (TEARS NATURALE II OP)    Apply 1 drop to eye daily. Both eyes twice daily   AZELASTINE (ASTELIN) 137 MCG/SPRAY NASAL SPRAY    Place 2 sprays into the nose. At bedtime   CALCIUM CITRATE-VITAMIN D (CITRACAL+D) 315-200 MG-UNIT PER TABLET    Take 2 tablets by mouth. Take twice daily   CELECOXIB (CELEBREX) 200 MG CAPSULE    Take 200 mg by mouth daily.   CHOLECALCIFEROL (VITAMIN D) 2000 UNITS CAPS    Take by mouth daily.     DONEPEZIL (ARICEPT) 10 MG TABLET    Take 1 tablet (10 mg total) by mouth daily.   FERROUS SULFATE 325 (65 FE) MG TABLET    Take 325 mg by mouth daily with breakfast.   METOPROLOL (LOPRESSOR) 50 MG TABLET    Take 1 tablet (50 mg total) by mouth 2 (two) times daily.   MULTIPLE VITAMINS-MINERALS (CENTRUM PO)    Take 1 tablet by mouth 1 day or 1 dose.   OMEPRAZOLE (PRILOSEC) 20 MG CAPSULE    Take 20 mg by mouth daily.   POLYETHYLENE GLYCOL (MIRALAX / GLYCOLAX) PACKET    Take 17 g by mouth. Take every other day  Modified Medications   No medications on file  Discontinued Medications   BISACODYL (DULCOLAX) 5 MG EC TABLET     Take 5 mg by mouth. Take 2 tablets daily as needed for constipation   LORATADINE (ALLERGY RELIEF) 10 MG TABLET    Take 10 mg by mouth daily.     RABEPRAZOLE (ACIPHEX) 20 MG TABLET    Take 20 mg by mouth daily.     Physical Exam: Physical Exam  Constitutional: She appears well-developed and well-nourished. No distress.  HENT:  Head: Normocephalic and atraumatic.  Nose: Nose normal.  Mouth/Throat: Oropharynx is clear and moist. No oropharyngeal exudate.  Eyes: Conjunctivae and EOM are normal. Pupils are equal, round, and reactive to light. Right eye exhibits no discharge. Left eye exhibits no discharge. No scleral icterus.  Neck: Normal  range of motion. Neck supple. No JVD present. No thyromegaly present.  Cardiovascular: Normal rate, regular rhythm and normal heart sounds.   No murmur heard. Pulmonary/Chest: Effort normal and breath sounds normal. No respiratory distress. She has no wheezes. She has no rales.  Abdominal: Soft. Bowel sounds are normal. She exhibits no distension. There is no tenderness.  Musculoskeletal: Normal range of motion. She exhibits tenderness. She exhibits no edema.  Pain in the right shoulder/shoulder blade, neck, middle/lower back  Lymphadenopathy:    She has no cervical adenopathy.  Neurological: She is alert. She has normal reflexes. She displays normal reflexes. No cranial nerve deficit. She exhibits normal muscle tone. Coordination normal.  Skin: Skin is warm and dry. No rash noted. She is not diaphoretic. No erythema.  Psychiatric: Her mood appears not anxious. Her affect is inappropriate. Her affect is not angry, not blunt and not labile. Her speech is delayed. Her speech is not rapid and/or pressured, not tangential and not slurred. She is slowed. She is not agitated, not aggressive, not hyperactive, not withdrawn and not actively hallucinating. Thought content is not paranoid and not delusional. Cognition and memory are impaired. She expresses impulsivity  and inappropriate judgment. She does not exhibit a depressed mood. She is communicative. She exhibits abnormal recent memory. She exhibits normal remote memory. She is attentive.    Filed Vitals:   07/06/13 1430  BP: 120/74  Pulse: 62  Height: 5\' 3"  (1.6 m)  Weight: 131 lb (59.421 kg)      Labs reviewed: Basic Metabolic Panel:  Recent Labs  16/10/96 03/09/13 05/16/13  NA 135* 136* 136*  K 4.4 3.9 4.1  BUN 19 17 21   CREATININE 0.7 0.6 0.7  TSH  --  1.12  --    Liver Function Tests:  Recent Labs  03/09/13  AST 12*  ALT 8  ALKPHOS 54   CBC:  Recent Labs  03/09/13 05/16/13 07/04/13  WBC 6.8 7.3 5.7  HGB 11.6* 14.4 12.2  HCT 35* 43 35*  PLT 179 162 151    Past Procedures: 04/13/13 X-ray C/T/L spine, right shoulder/scapula: disc disease L3-L5. normal with no significant abnormalities seen the right shoulder and the right scapula. Minimal disc disease cervical spine. Old upper thoracic vertebral body compression fractures/disc disease.    Assessment/Plan DEMENTIA Lived in [place] for care, takes Aricept 10mg , gradual decline seen in the past a few months. Staff has reported the patient's confused has worsened-UA shows no growth 07/06/13. The patient admitted her memory problems. Will repeat MMSE,. Refer to cognitive therapy. Adding Namenda 7mg  daily x1wk, the 14mg  daily for 1 wk, then 21mg  daily for 1wk, the 28mg  daily. The patient also c/o not sleeping well at night but declined sleeping aid.           HYPERTENSION, BENIGN ESSENTIAL Controlled on Amlodipine 5mg  and Metoprolol 50mg  bid.         Unspecified constipation Stable on MiraLax qod      Thoracic back pain C/o pain in her right shoulder and shoulder blade(no decreased ROM of the right shoulder) and lower back.  Hx of middle of her spine vertebral body got out of alignment from falling on staircase in 1950--chronic on and off pain since the event. Celebrex 200mg  daily for pain, Prilosec 20mg  daily  for GI protection--pain is better controlled.           Family/ Staff Communication: observe the patient.   Goals of Care: [place]  Labs/tests ordered: OT for cognitive  therapy, MMSE

## 2013-07-06 NOTE — Assessment & Plan Note (Signed)
Controlled on Amlodipine 5mg and Metoprolol 50mg bid.     

## 2013-07-06 NOTE — Assessment & Plan Note (Signed)
C/o pain in her right shoulder and shoulder blade(no decreased ROM of the right shoulder) and lower back.  Hx of middle of her spine vertebral body got out of alignment from falling on staircase in 1950--chronic on and off pain since the event. Celebrex 200mg daily for pain, Prilosec 20mg daily for GI protection--pain is better controlled.         

## 2013-07-06 NOTE — Assessment & Plan Note (Signed)
Stable on MiraLax qod                 

## 2013-07-06 NOTE — Assessment & Plan Note (Addendum)
Lived in [place] for care, takes Aricept 10mg , gradual decline seen in the past a few months. Staff has reported the patient's confused has worsened-UA shows no growth 07/06/13. The patient admitted her memory problems. Will repeat MMSE,. Refer to cognitive therapy. Adding Namenda 7mg  daily x1wk, the 14mg  daily for 1 wk, then 21mg  daily for 1wk, the 28mg  daily. The patient also c/o not sleeping well at night but declined sleeping aid.

## 2013-07-18 DIAGNOSIS — L851 Acquired keratosis [keratoderma] palmaris et plantaris: Secondary | ICD-10-CM | POA: Diagnosis not present

## 2013-07-18 DIAGNOSIS — M2040 Other hammer toe(s) (acquired), unspecified foot: Secondary | ICD-10-CM | POA: Diagnosis not present

## 2013-07-18 DIAGNOSIS — L608 Other nail disorders: Secondary | ICD-10-CM | POA: Diagnosis not present

## 2013-07-25 DIAGNOSIS — R29818 Other symptoms and signs involving the nervous system: Secondary | ICD-10-CM | POA: Diagnosis not present

## 2013-07-25 DIAGNOSIS — M6281 Muscle weakness (generalized): Secondary | ICD-10-CM | POA: Diagnosis not present

## 2013-07-26 DIAGNOSIS — M6281 Muscle weakness (generalized): Secondary | ICD-10-CM | POA: Diagnosis not present

## 2013-07-26 DIAGNOSIS — R29818 Other symptoms and signs involving the nervous system: Secondary | ICD-10-CM | POA: Diagnosis not present

## 2013-07-28 DIAGNOSIS — R29818 Other symptoms and signs involving the nervous system: Secondary | ICD-10-CM | POA: Diagnosis not present

## 2013-07-28 DIAGNOSIS — M6281 Muscle weakness (generalized): Secondary | ICD-10-CM | POA: Diagnosis not present

## 2013-08-01 DIAGNOSIS — R29818 Other symptoms and signs involving the nervous system: Secondary | ICD-10-CM | POA: Diagnosis not present

## 2013-08-01 DIAGNOSIS — M6281 Muscle weakness (generalized): Secondary | ICD-10-CM | POA: Diagnosis not present

## 2013-08-03 DIAGNOSIS — R29818 Other symptoms and signs involving the nervous system: Secondary | ICD-10-CM | POA: Diagnosis not present

## 2013-08-03 DIAGNOSIS — M6281 Muscle weakness (generalized): Secondary | ICD-10-CM | POA: Diagnosis not present

## 2013-08-04 ENCOUNTER — Encounter: Payer: Self-pay | Admitting: *Deleted

## 2013-08-04 DIAGNOSIS — M6281 Muscle weakness (generalized): Secondary | ICD-10-CM | POA: Diagnosis not present

## 2013-08-04 DIAGNOSIS — R29818 Other symptoms and signs involving the nervous system: Secondary | ICD-10-CM | POA: Diagnosis not present

## 2013-08-08 DIAGNOSIS — R29818 Other symptoms and signs involving the nervous system: Secondary | ICD-10-CM | POA: Diagnosis not present

## 2013-08-08 DIAGNOSIS — M6281 Muscle weakness (generalized): Secondary | ICD-10-CM | POA: Diagnosis not present

## 2013-08-09 DIAGNOSIS — R29818 Other symptoms and signs involving the nervous system: Secondary | ICD-10-CM | POA: Diagnosis not present

## 2013-08-09 DIAGNOSIS — M6281 Muscle weakness (generalized): Secondary | ICD-10-CM | POA: Diagnosis not present

## 2013-08-10 DIAGNOSIS — M6281 Muscle weakness (generalized): Secondary | ICD-10-CM | POA: Diagnosis not present

## 2013-08-10 DIAGNOSIS — R29818 Other symptoms and signs involving the nervous system: Secondary | ICD-10-CM | POA: Diagnosis not present

## 2013-08-14 DIAGNOSIS — N390 Urinary tract infection, site not specified: Secondary | ICD-10-CM | POA: Diagnosis not present

## 2013-08-15 DIAGNOSIS — I10 Essential (primary) hypertension: Secondary | ICD-10-CM | POA: Diagnosis not present

## 2013-08-15 LAB — BASIC METABOLIC PANEL: Creatinine: 0.7 mg/dL (ref 0.5–1.1)

## 2013-08-17 ENCOUNTER — Encounter: Payer: Self-pay | Admitting: Nurse Practitioner

## 2013-08-17 ENCOUNTER — Non-Acute Institutional Stay: Payer: Medicare Other | Admitting: Nurse Practitioner

## 2013-08-17 VITALS — BP 114/78 | HR 60 | Ht 63.0 in | Wt 131.0 lb

## 2013-08-17 DIAGNOSIS — N318 Other neuromuscular dysfunction of bladder: Secondary | ICD-10-CM

## 2013-08-17 DIAGNOSIS — Z299 Encounter for prophylactic measures, unspecified: Secondary | ICD-10-CM

## 2013-08-17 DIAGNOSIS — M159 Polyosteoarthritis, unspecified: Secondary | ICD-10-CM

## 2013-08-17 DIAGNOSIS — F068 Other specified mental disorders due to known physiological condition: Secondary | ICD-10-CM

## 2013-08-17 DIAGNOSIS — K5900 Constipation, unspecified: Secondary | ICD-10-CM

## 2013-08-17 DIAGNOSIS — I10 Essential (primary) hypertension: Secondary | ICD-10-CM

## 2013-08-17 DIAGNOSIS — D649 Anemia, unspecified: Secondary | ICD-10-CM

## 2013-08-17 NOTE — Assessment & Plan Note (Signed)
Lived in [place] for care, doing better since Namenda added. Continue Aricept and Namenda.

## 2013-08-17 NOTE — Assessment & Plan Note (Addendum)
C/o frequent bathroom trips that interfere her night rest. Will try Myrbetriq 25mg  daily. Urine culture showed no growth 07/06/13

## 2013-08-17 NOTE — Assessment & Plan Note (Signed)
Controlled on Amlodipine 5mg and Metoprolol 50mg bid.     

## 2013-08-17 NOTE — Progress Notes (Signed)
Patient ID: Jamie [name], female   DOB: 11/22/1920, 79 y.o.   MRN: [id number] Code Status: DNR  Allergies  Allergen Reactions  . Alendronate Sodium     REACTION: GI    Chief Complaint  Patient presents with  . Medical Managment of Chronic Issues    confusion, dementia    HPI: Patient is a 79 y.o. female seen in the clinic at [facility] today for evaluation of nocturnal urinary frequency, loose bowels, and other chronic medical conditions.  Review of Systems:  Review of Systems  Constitutional: Negative for fever, chills, weight loss, malaise/fatigue and diaphoresis.  HENT: Positive for hearing loss. Negative for ear pain, congestion, sore throat and neck pain.   Eyes: Negative for pain, discharge and redness.  Respiratory: Negative for cough, sputum production, shortness of breath and wheezing.   Cardiovascular: Negative for chest pain, orthopnea, claudication, leg swelling and PND.  Gastrointestinal: Negative for heartburn, nausea, vomiting, abdominal pain, diarrhea, constipation and blood in stool.  Genitourinary: Positive for frequency. Negative for dysuria, urgency, hematuria and flank pain.  Musculoskeletal: Positive for back pain, joint pain and falls. Negative for myalgias.  Skin: Negative for itching and rash.  Neurological: Positive for weakness (generalized. ). Negative for dizziness, tingling, tremors, sensory change, speech change, focal weakness, seizures, loss of consciousness and headaches.  Endo/Heme/Allergies: Negative for environmental allergies and polydipsia. Does not bruise/bleed easily.  Psychiatric/Behavioral: Positive for memory loss. Negative for depression and hallucinations. The patient is not nervous/anxious and does not have insomnia.      Past Medical History  Diagnosis Date  . Osteoporosis   . Allergic rhinitis   . Dementia   . Osteoarthritis     hands  . IBS (irritable bowel syndrome)   . GERD (gastroesophageal reflux disease)   .  Hypertension   . Palpitation     a. PAC's & PVC's by Holter - 2011  . Tortuous colon 2002  . Descending thoracic aortic dissection 04/24/2010    a. MRA 2011 - begins distal to left subclavian and extends throughout the full length of the Ao.  Marland Kitchen Aortic insufficiency 04/16/2010    a. Echo 2011 - mild to moderate AI, EF 60%.  . Anemia, unspecified 01/12/2013  . Other abnormal blood chemistry 01/12/2013  . Lumbago 10/06/2012  . Personal history of fall 10/06/2012  . Hyposmolality and/or hyponatremia 09/29/2012  . Otitis externa 09/29/2012  . Impacted cerumen 09/29/2012  . Unspecified conductive hearing loss 09/29/2012  . External hemorrhoids without mention of complication 09/29/2012  . Unspecified venous (peripheral) insufficiency 09/29/2012  . Unspecified constipation 09/29/2012  . Abnormality of gait 09/29/2012  . Congestive heart failure, unspecified 06/30/2003   Past Surgical History  Procedure Laterality Date  . Dilation and curettage of uterus  1960  . Cataract extraction w/ intraocular lens implant     Social History:   reports that she has never smoked. She has never used smokeless tobacco. She reports that she does not drink alcohol or use illicit drugs.  Family History  Problem Relation Age of Onset  . Diabetes Mother   . Coronary artery disease Father   . Hypertension Father   . Diabetes      sisters    Medications: Patient's Medications  New Prescriptions   No medications on file  Previous Medications   ACETAMINOPHEN (TYLENOL) 325 MG TABLET    Take 325 mg by mouth. Take two tablets every 6 hours   AMLODIPINE (NORVASC) 5 MG TABLET  Take 1 tablet (5 mg total) by mouth daily.   ARTIFICIAL TEAR SOLUTION (TEARS NATURALE II OP)    Apply 1 drop to eye daily. Both eyes twice daily   AZELASTINE (ASTELIN) 137 MCG/SPRAY NASAL SPRAY    Place 2 sprays into the nose. At bedtime   CALCIUM CITRATE-VITAMIN D (CITRACAL+D) 315-200 MG-UNIT PER TABLET    Take 2 tablets by mouth. Take twice  daily   CELECOXIB (CELEBREX) 200 MG CAPSULE    Take 200 mg by mouth daily.   CHOLECALCIFEROL (VITAMIN D) 2000 UNITS CAPS    Take by mouth daily.     DONEPEZIL (ARICEPT) 10 MG TABLET    Take 1 tablet (10 mg total) by mouth daily.   MEMANTINE (NAMENDA) 10 MG TABLET    Take 10 mg by mouth 2 (two) times daily.   METOPROLOL (LOPRESSOR) 50 MG TABLET    Take 1 tablet (50 mg total) by mouth 2 (two) times daily.   MULTIPLE VITAMINS-MINERALS (CENTRUM PO)    Take 1 tablet by mouth 1 day or 1 dose.   OMEPRAZOLE (PRILOSEC) 20 MG CAPSULE    Take 20 mg by mouth daily.   POLYETHYLENE GLYCOL (MIRALAX / GLYCOLAX) PACKET    Take 17 g by mouth. Fill cap to 17 gm mark, mix with 4 oz of fluid and take by mouth every other day.  Modified Medications   No medications on file  Discontinued Medications   ACETAMINOPHEN (TYLENOL) 500 MG TABLET    Take 500 mg by mouth every 6 (six) hours as needed for pain.   FERROUS SULFATE 325 (65 FE) MG TABLET    Take 325 mg by mouth daily with breakfast.     Physical Exam: Physical Exam  Constitutional: She appears well-developed and well-nourished. No distress.  HENT:  Head: Normocephalic and atraumatic.  Nose: Nose normal.  Mouth/Throat: Oropharynx is clear and moist. No oropharyngeal exudate.  Eyes: Conjunctivae and EOM are normal. Pupils are equal, round, and reactive to light. Right eye exhibits no discharge. Left eye exhibits no discharge. No scleral icterus.  Neck: Normal range of motion. Neck supple. No JVD present. No thyromegaly present.  Cardiovascular: Normal rate, regular rhythm and normal heart sounds.   No murmur heard. Pulmonary/Chest: Effort normal and breath sounds normal. No respiratory distress. She has no wheezes. She has no rales.  Abdominal: Soft. Bowel sounds are normal. She exhibits no distension. There is no tenderness.  Musculoskeletal: Normal range of motion. She exhibits tenderness. She exhibits no edema.  Pain in the right shoulder/shoulder blade,  neck, middle/lower back  Lymphadenopathy:    She has no cervical adenopathy.  Neurological: She is alert. She has normal reflexes. No cranial nerve deficit. She exhibits normal muscle tone. Coordination normal.  Skin: Skin is warm and dry. No rash noted. She is not diaphoretic. No erythema.  Psychiatric: Her mood appears not anxious. Her affect is inappropriate. Her affect is not angry, not blunt and not labile. Her speech is delayed. Her speech is not rapid and/or pressured, not tangential and not slurred. She is slowed. She is not agitated, not aggressive, not hyperactive, not withdrawn and not actively hallucinating. Thought content is not paranoid and not delusional. Cognition and memory are impaired. She expresses impulsivity and inappropriate judgment. She does not exhibit a depressed mood. She is communicative. She exhibits abnormal recent memory. She exhibits normal remote memory. She is attentive.    Filed Vitals:   08/17/13 1353  BP: 114/78  Pulse: 60  Height: 5\' 3"  (1.6 m)  Weight: 131 lb (59.421 kg)      Labs reviewed: Basic Metabolic Panel:  Recent Labs  16/10/96 03/09/13 05/16/13  NA 135* 136* 136*  K 4.4 3.9 4.1  BUN 19 17 21   CREATININE 0.7 0.6 0.7  TSH  --  1.12  --    Liver Function Tests:  Recent Labs  03/09/13  AST 12*  ALT 8  ALKPHOS 54   CBC:  Recent Labs  03/09/13 05/16/13 07/04/13  WBC 6.8 7.3 5.7  HGB 11.6* 14.4 12.2  HCT 35* 43 35*  PLT 179 162 151    Past Procedures: 04/13/13 X-ray C/T/L spine, right shoulder/scapula: disc disease L3-L5. normal with no significant abnormalities seen the right shoulder and the right scapula. Minimal disc disease cervical spine. Old upper thoracic vertebral body compression fractures/disc disease.    Assessment/Plan OVERACTIVE BLADDER C/o frequent bathroom trips that interfere her night rest. Will try Myrbetriq 25mg  daily. Urine culture showed no growth 07/06/13  Constipation C/o loose bowels--hx IBS,  presently taking MiraLax qod-will change to prn, adding Colace 100mg  bid for now.   OSTEOARTHROSIS, GENERALIZED, MULTIPLE SITES C/o pain in her right shoulder and shoulder blade(no decreased ROM of the right shoulder) and lower back.  Hx of middle of her spine vertebral body got out of alignment from falling on staircase in 1950--chronic on and off pain since the event. Celebrex 200mg  daily for pain, Prilosec 20mg  daily for GI protection--pain is better controlled.          Hypertension Controlled on Amlodipine 5mg  and Metoprolol 50mg  bid.           [place]ived in [place], doing better since Namenda added. Continue Aricept and Namenda.             Anemia IDA and chronic disease--takes Fe. Hgb 11.6 03/09/13-Hgb 14.4 05/16/13, 12.2 07/04/13, off Iron             Family/ Staff Communication: observe the patient.   Goals of Care: [place]  Labs/tests ordered: none

## 2013-08-17 NOTE — Assessment & Plan Note (Signed)
C/o pain in her right shoulder and shoulder blade(no decreased ROM of the right shoulder) and lower back.  Hx of middle of her spine vertebral body got out of alignment from falling on staircase in 1950--chronic on and off pain since the event. Celebrex 200mg  daily for pain, Prilosec 20mg  daily for GI protection--pain is better controlled.

## 2013-08-17 NOTE — Assessment & Plan Note (Signed)
IDA and chronic disease--takes Fe. Hgb 11.6 03/09/13-Hgb 14.4 05/16/13, 12.2 07/04/13, off Iron

## 2013-08-17 NOTE — Assessment & Plan Note (Signed)
C/o loose bowels--hx IBS, presently taking MiraLax qod-will change to prn, adding Colace 100mg  bid for now.

## 2013-08-31 ENCOUNTER — Encounter: Payer: Self-pay | Admitting: Nurse Practitioner

## 2013-08-31 ENCOUNTER — Non-Acute Institutional Stay: Payer: Medicare Other | Admitting: Nurse Practitioner

## 2013-08-31 VITALS — BP 104/62 | HR 60 | Ht 63.0 in | Wt 130.0 lb

## 2013-08-31 DIAGNOSIS — S6000XA Contusion of unspecified finger without damage to nail, initial encounter: Secondary | ICD-10-CM | POA: Diagnosis not present

## 2013-08-31 DIAGNOSIS — I10 Essential (primary) hypertension: Secondary | ICD-10-CM

## 2013-08-31 DIAGNOSIS — F068 Other specified mental disorders due to known physiological condition: Secondary | ICD-10-CM

## 2013-08-31 DIAGNOSIS — W19XXXA Unspecified fall, initial encounter: Secondary | ICD-10-CM | POA: Insufficient documentation

## 2013-08-31 DIAGNOSIS — K5900 Constipation, unspecified: Secondary | ICD-10-CM | POA: Diagnosis not present

## 2013-08-31 DIAGNOSIS — M546 Pain in thoracic spine: Secondary | ICD-10-CM

## 2013-08-31 DIAGNOSIS — IMO0001 Reserved for inherently not codable concepts without codable children: Secondary | ICD-10-CM | POA: Insufficient documentation

## 2013-08-31 DIAGNOSIS — R269 Unspecified abnormalities of gait and mobility: Secondary | ICD-10-CM

## 2013-08-31 DIAGNOSIS — N318 Other neuromuscular dysfunction of bladder: Secondary | ICD-10-CM

## 2013-08-31 DIAGNOSIS — D649 Anemia, unspecified: Secondary | ICD-10-CM

## 2013-08-31 DIAGNOSIS — S60022A Contusion of left index finger without damage to nail, initial encounter: Secondary | ICD-10-CM

## 2013-08-31 DIAGNOSIS — K219 Gastro-esophageal reflux disease without esophagitis: Secondary | ICD-10-CM

## 2013-08-31 NOTE — Assessment & Plan Note (Addendum)
Fell b/c her knees gave away in her bathroom. Shed denied chest pain, palpitation, dizziness/lightheadedness associated with her fall. She stated her knees gave away frequently while she was walking with walker. PT to evaluate for w/c and training. She sustained a contusion of the right 2nd finger from the fall today. Able to make a tight fist w/o pain.

## 2013-08-31 NOTE — Assessment & Plan Note (Signed)
Able to make a tight fist w/o pain. Will apply ice pk 51min/each 4x/day x3days. Observe

## 2013-09-01 DIAGNOSIS — R269 Unspecified abnormalities of gait and mobility: Secondary | ICD-10-CM | POA: Insufficient documentation

## 2013-09-01 NOTE — Assessment & Plan Note (Signed)
Controlled on Amlodipine 5mg and Metoprolol 50mg bid.     

## 2013-09-01 NOTE — Assessment & Plan Note (Signed)
C/o loose bowels--hx IBS, resolved since MiraLax prn and dded Colace 100mg bid    

## 2013-09-01 NOTE — Assessment & Plan Note (Signed)
C/o frequent bathroom trips that interfere her night-better since on  Myrbetriq 25mg  daily.

## 2013-09-01 NOTE — Assessment & Plan Note (Signed)
Lived in [place] for care, doing better since Namenda added. Continue Aricept and Namenda.

## 2013-09-01 NOTE — Assessment & Plan Note (Signed)
Multiple factorials: back pain, increased frailty, weak knees, lack of safety awareness. PT to evaluate for w/c

## 2013-09-01 NOTE — Assessment & Plan Note (Signed)
Stable on Prilosec 20 mg daily. 

## 2013-09-01 NOTE — Assessment & Plan Note (Signed)
IDA and chronic disease--takes Fe. Hgb 11.6 03/09/13-Hgb 14.4 05/16/13, 12.2 07/04/13, off Iron

## 2013-09-01 NOTE — Progress Notes (Signed)
Patient ID: Jamie [name], female   DOB: August 28, 1920, 79 y.o.   MRN: [id number]  Code Status: DNR  Allergies  Allergen Reactions  . Alendronate Sodium     REACTION: GI    Chief Complaint  Patient presents with  . Fall    patient fell at 1:20, left hand index finger tip was blue, now at 4:45 the whole index finger is blue and ring finger also. Nurse wanted her check.     HPI: Patient is a 79 y.o. female seen in the clinic at [facility] today for evaluation of falling, the left 2nd finger contusion, nocturnal urinary frequency,  and other chronic medical conditions.  Review of Systems:  Review of Systems  Constitutional: Negative for fever, chills, weight loss, malaise/fatigue and diaphoresis.  HENT: Positive for hearing loss. Negative for congestion, ear pain and sore throat.   Eyes: Negative for pain, discharge and redness.  Respiratory: Negative for cough, sputum production, shortness of breath and wheezing.   Cardiovascular: Negative for chest pain, orthopnea, claudication, leg swelling and PND.  Gastrointestinal: Negative for heartburn, nausea, vomiting, abdominal pain, diarrhea, constipation and blood in stool.  Genitourinary: Positive for frequency. Negative for dysuria, urgency, hematuria and flank pain.  Musculoskeletal: Positive for back pain, falls and joint pain. Negative for myalgias and neck pain.       Gait instability.   Skin: Negative for itching and rash.       Left 2nd finger contusion.   Neurological: Positive for weakness (generalized. ). Negative for dizziness, tingling, tremors, sensory change, speech change, focal weakness, seizures, loss of consciousness and headaches.  Endo/Heme/Allergies: Negative for environmental allergies and polydipsia. Does not bruise/bleed easily.  Psychiatric/Behavioral: Positive for memory loss. Negative for depression and hallucinations. The patient is not nervous/anxious and does not have insomnia.      Past Medical  History  Diagnosis Date  . Osteoporosis   . Allergic rhinitis   . Dementia   . Osteoarthritis     hands  . IBS (irritable bowel syndrome)   . GERD (gastroesophageal reflux disease)   . Hypertension   . Palpitation     a. PAC's & PVC's by Holter - 2011  . Tortuous colon 2002  . Descending thoracic aortic dissection 04/24/2010    a. MRA 2011 - begins distal to left subclavian and extends throughout the full length of the Ao.  Marland Kitchen Aortic insufficiency 04/16/2010    a. Echo 2011 - mild to moderate AI, EF 60%.  . Anemia, unspecified 01/12/2013  . Other abnormal blood chemistry 01/12/2013  . Lumbago 10/06/2012  . Personal history of fall 10/06/2012  . Hyposmolality and/or hyponatremia 09/29/2012  . Otitis externa 09/29/2012  . Impacted cerumen 09/29/2012  . Unspecified conductive hearing loss 09/29/2012  . External hemorrhoids without mention of complication 09/29/2012  . Unspecified venous (peripheral) insufficiency 09/29/2012  . Unspecified constipation 09/29/2012  . Abnormality of gait 09/29/2012  . Congestive heart failure, unspecified 06/30/2003   Past Surgical History  Procedure Laterality Date  . Dilation and curettage of uterus  1960  . Cataract extraction w/ intraocular lens implant     Social History:   reports that she has never smoked. She has never used smokeless tobacco. She reports that she does not drink alcohol or use illicit drugs.  Family History  Problem Relation Age of Onset  . Diabetes Mother   . Coronary artery disease Father   . Hypertension Father   . Diabetes  sisters    Medications: Patient's Medications  New Prescriptions   No medications on file  Previous Medications   ACETAMINOPHEN (TYLENOL) 325 MG TABLET    Take 325 mg by mouth. Take two tablets every 6 hours   AMLODIPINE (NORVASC) 5 MG TABLET    Take 1 tablet (5 mg total) by mouth daily.   ARTIFICIAL TEAR SOLUTION (TEARS NATURALE II OP)    Apply 1 drop to eye daily. Both eyes twice daily    AZELASTINE (ASTELIN) 137 MCG/SPRAY NASAL SPRAY    Place 2 sprays into the nose. At bedtime   CALCIUM CITRATE-VITAMIN D (CITRACAL+D) 315-200 MG-UNIT PER TABLET    Take 2 tablets by mouth. Take twice daily   CELECOXIB (CELEBREX) 200 MG CAPSULE    Take 200 mg by mouth daily.   CHOLECALCIFEROL (VITAMIN D) 2000 UNITS CAPS    Take by mouth daily.     DONEPEZIL (ARICEPT) 10 MG TABLET    Take 1 tablet (10 mg total) by mouth daily.   MEMANTINE (NAMENDA) 10 MG TABLET    Take 10 mg by mouth 2 (two) times daily.   METOPROLOL (LOPRESSOR) 50 MG TABLET    Take 1 tablet (50 mg total) by mouth 2 (two) times daily.   MULTIPLE VITAMINS-MINERALS (CENTRUM PO)    Take 1 tablet by mouth 1 day or 1 dose.   OMEPRAZOLE (PRILOSEC) 20 MG CAPSULE    Take 20 mg by mouth daily.   POLYETHYLENE GLYCOL (MIRALAX / GLYCOLAX) PACKET    Take 17 g by mouth. Fill cap to 17 gm mark, mix with 4 oz of fluid and take by mouth every other day.  Modified Medications   No medications on file  Discontinued Medications   No medications on file     Physical Exam: Physical Exam  Constitutional: She appears well-developed and well-nourished. No distress.  HENT:  Head: Normocephalic and atraumatic.  Nose: Nose normal.  Mouth/Throat: Oropharynx is clear and moist. No oropharyngeal exudate.  Eyes: Conjunctivae and EOM are normal. Pupils are equal, round, and reactive to light. Right eye exhibits no discharge. Left eye exhibits no discharge. No scleral icterus.  Neck: Normal range of motion. Neck supple. No JVD present. No thyromegaly present.  Cardiovascular: Normal rate, regular rhythm and normal heart sounds.   No murmur heard. Pulmonary/Chest: Effort normal and breath sounds normal. No respiratory distress. She has no wheezes. She has no rales.  Abdominal: Soft. Bowel sounds are normal. She exhibits no distension. There is no tenderness.  Musculoskeletal: Normal range of motion. She exhibits tenderness. She exhibits no edema.  Pain in  the right shoulder/shoulder blade, neck, middle/lower back-gait instability and frequent falling.   Lymphadenopathy:    She has no cervical adenopathy.  Neurological: She is alert. She has normal reflexes. No cranial nerve deficit. She exhibits normal muscle tone. Coordination normal.  Skin: Skin is warm and dry. No rash noted. She is not diaphoretic. No erythema.  Left 2nd finger contusion.   Psychiatric: Her mood appears not anxious. Her affect is inappropriate. Her affect is not angry, not blunt and not labile. Her speech is delayed. Her speech is not rapid and/or pressured, not tangential and not slurred. She is slowed. She is not agitated, not aggressive, not hyperactive, not withdrawn and not actively hallucinating. Thought content is not paranoid and not delusional. Cognition and memory are impaired. She expresses impulsivity and inappropriate judgment. She does not exhibit a depressed mood. She is communicative. She exhibits abnormal recent memory.  She exhibits normal remote memory. She is attentive.    Filed Vitals:   08/31/13 1657  BP: 104/62  Pulse: 60  Height: 5\' 3"  (1.6 m)  Weight: 130 lb (58.968 kg)      Labs reviewed: Basic Metabolic Panel:  Recent Labs  16/10/96 03/09/13 05/16/13 08/15/13  NA 135* 136* 136* 134*  K 4.4 3.9 4.1 3.5  BUN 19 17 21  25*  CREATININE 0.7 0.6 0.7 0.7  TSH  --  1.12  --   --    Liver Function Tests:  Recent Labs  03/09/13  AST 12*  ALT 8  ALKPHOS 54   CBC:  Recent Labs  03/09/13 05/16/13 07/04/13  WBC 6.8 7.3 5.7  HGB 11.6* 14.4 12.2  HCT 35* 43 35*  PLT 179 162 151    Past Procedures: 04/13/13 X-ray C/T/L spine, right shoulder/scapula: disc disease L3-L5. normal with no significant abnormalities seen the right shoulder and the right scapula. Minimal disc disease cervical spine. Old upper thoracic vertebral body compression fractures/disc disease.    Assessment/Plan Genia Hotter b/c her knees gave away in her bathroom. Shed  denied chest pain, palpitation, dizziness/lightheadedness associated with her fall. She stated her knees gave away frequently while she was walking with walker. PT to evaluate for w/c and training. She sustained a contusion of the right 2nd finger from the fall today. Able to make a tight fist w/o pain.   Contusion of second finger of left hand Able to make a tight fist w/o pain. Will apply ice pk 21min/each 4x/day x3days. Observe   Unspecified constipation C/o loose bowels--hx IBS, resolved since MiraLax prn and dded Colace 100mg  bid     Thoracic back pain No c/o  pain in her right shoulder and shoulder blade(no decreased ROM of the right shoulder) and lower back today.  Hx of middle of her spine vertebral body got out of alignment from falling on staircase in 1950--chronic on and off pain since the event. Celebrex 200mg  daily for pain, Prilosec 20mg  daily for GI protection--pain is better controlled.           OVERACTIVE BLADDER C/o frequent bathroom trips that interfere her night-better since on  Myrbetriq 25mg  daily.     Hypertension Controlled on Amlodipine 5mg  and Metoprolol 50mg  bid.           GERD (gastroesophageal reflux disease) Stable on Prilosec 20mg  daily.     DEMENTIA Lived in [place] for [place], doing better since Namenda added. Continue Aricept and Namenda.               Anemia IDA and chronic disease--takes Fe. Hgb 11.6 03/09/13-Hgb 14.4 05/16/13, 12.2 07/04/13, off Iron             Abnormality of gait Multiple factorials: back pain, increased frailty, weak knees, lack of safety awareness. PT to evaluate for w/c     Family/ Staff Communication: observe the patient.   Goals of Care: [place]  Labs/tests ordered: none

## 2013-09-01 NOTE — Assessment & Plan Note (Signed)
No c/o  pain in her right shoulder and shoulder blade(no decreased ROM of the right shoulder) and lower back today.  Hx of middle of her spine vertebral body got out of alignment from falling on staircase in 1950--chronic on and off pain since the event. Celebrex 200mg daily for pain, Prilosec 20mg daily for GI protection--pain is better controlled.            

## 2013-09-12 DIAGNOSIS — M6281 Muscle weakness (generalized): Secondary | ICD-10-CM | POA: Diagnosis not present

## 2013-09-12 DIAGNOSIS — M25569 Pain in unspecified knee: Secondary | ICD-10-CM | POA: Diagnosis not present

## 2013-09-14 DIAGNOSIS — M6281 Muscle weakness (generalized): Secondary | ICD-10-CM | POA: Diagnosis not present

## 2013-09-14 DIAGNOSIS — M25569 Pain in unspecified knee: Secondary | ICD-10-CM | POA: Diagnosis not present

## 2013-09-18 DIAGNOSIS — M25569 Pain in unspecified knee: Secondary | ICD-10-CM | POA: Diagnosis not present

## 2013-09-18 DIAGNOSIS — M6281 Muscle weakness (generalized): Secondary | ICD-10-CM | POA: Diagnosis not present

## 2013-09-19 DIAGNOSIS — M25569 Pain in unspecified knee: Secondary | ICD-10-CM | POA: Diagnosis not present

## 2013-09-19 DIAGNOSIS — M6281 Muscle weakness (generalized): Secondary | ICD-10-CM | POA: Diagnosis not present

## 2013-09-20 DIAGNOSIS — N390 Urinary tract infection, site not specified: Secondary | ICD-10-CM | POA: Diagnosis not present

## 2013-09-21 DIAGNOSIS — M25569 Pain in unspecified knee: Secondary | ICD-10-CM | POA: Diagnosis not present

## 2013-09-21 DIAGNOSIS — M6281 Muscle weakness (generalized): Secondary | ICD-10-CM | POA: Diagnosis not present

## 2013-09-25 DIAGNOSIS — R262 Difficulty in walking, not elsewhere classified: Secondary | ICD-10-CM | POA: Diagnosis not present

## 2013-09-25 DIAGNOSIS — M6281 Muscle weakness (generalized): Secondary | ICD-10-CM | POA: Diagnosis not present

## 2013-09-27 DIAGNOSIS — M6281 Muscle weakness (generalized): Secondary | ICD-10-CM | POA: Diagnosis not present

## 2013-09-27 DIAGNOSIS — R262 Difficulty in walking, not elsewhere classified: Secondary | ICD-10-CM | POA: Diagnosis not present

## 2013-09-27 DIAGNOSIS — I10 Essential (primary) hypertension: Secondary | ICD-10-CM | POA: Diagnosis not present

## 2013-09-27 LAB — BASIC METABOLIC PANEL
BUN: 18 mg/dL (ref 4–21)
Sodium: 136 mmol/L — AB (ref 137–147)

## 2013-09-28 ENCOUNTER — Non-Acute Institutional Stay: Payer: Medicare Other | Admitting: Nurse Practitioner

## 2013-09-28 DIAGNOSIS — M6281 Muscle weakness (generalized): Secondary | ICD-10-CM | POA: Diagnosis not present

## 2013-09-28 DIAGNOSIS — K5900 Constipation, unspecified: Secondary | ICD-10-CM

## 2013-09-28 DIAGNOSIS — F068 Other specified mental disorders due to known physiological condition: Secondary | ICD-10-CM

## 2013-09-28 DIAGNOSIS — R269 Unspecified abnormalities of gait and mobility: Secondary | ICD-10-CM | POA: Diagnosis not present

## 2013-09-28 DIAGNOSIS — M546 Pain in thoracic spine: Secondary | ICD-10-CM

## 2013-09-28 DIAGNOSIS — N318 Other neuromuscular dysfunction of bladder: Secondary | ICD-10-CM | POA: Diagnosis not present

## 2013-09-28 DIAGNOSIS — I10 Essential (primary) hypertension: Secondary | ICD-10-CM

## 2013-09-28 DIAGNOSIS — IMO0001 Reserved for inherently not codable concepts without codable children: Secondary | ICD-10-CM

## 2013-09-28 DIAGNOSIS — W19XXXS Unspecified fall, sequela: Secondary | ICD-10-CM

## 2013-09-28 DIAGNOSIS — R262 Difficulty in walking, not elsewhere classified: Secondary | ICD-10-CM | POA: Diagnosis not present

## 2013-09-29 ENCOUNTER — Encounter: Payer: Self-pay | Admitting: Nurse Practitioner

## 2013-09-29 DIAGNOSIS — R269 Unspecified abnormalities of gait and mobility: Secondary | ICD-10-CM | POA: Insufficient documentation

## 2013-09-29 DIAGNOSIS — R296 Repeated falls: Secondary | ICD-10-CM | POA: Insufficient documentation

## 2013-09-29 NOTE — Assessment & Plan Note (Addendum)
No c/o  pain in her right shoulder and shoulder blade(no decreased ROM of the right shoulder) and lower back today.  Hx of middle of her spine vertebral body got out of alignment from falling on staircase in 1950--chronic on and off pain since the event. Celebrex 200mg  daily for pain, Prilosec 20mg  daily for GI protection--pain is better controlled.

## 2013-09-29 NOTE — Assessment & Plan Note (Signed)
C/o frequent bathroom trips that interfere her night-better since on  Myrbetriq started-no change since therapeutic exchanged to Oxybutynin       

## 2013-09-29 NOTE — Assessment & Plan Note (Signed)
Frequent falling related to lack of safety awareness and increased frailty. SNF care needed.

## 2013-09-29 NOTE — Assessment & Plan Note (Signed)
Controlled on Amlodipine 5mg and Metoprolol 50mg bid.     

## 2013-09-29 NOTE — Assessment & Plan Note (Addendum)
Progressing and frequent falling even if it was initial improvement when Namenda started. Will consider SNF for care needs. Continue Aricept and Namenda. Seems more cognitive changes since Myrbetriq was switched to Oxybutynin.

## 2013-09-29 NOTE — Assessment & Plan Note (Signed)
C/o loose bowels--hx IBS, resolved since MiraLax prn and dded Colace 100mg bid    

## 2013-09-29 NOTE — Progress Notes (Signed)
Patient ID: Jamie [name], female   DOB: Oct 23, 1920, 79 y.o.   MRN: [id number]  Code Status: DNR  Allergies  Allergen Reactions  . Alendronate Sodium     REACTION: GI    Chief Complaint  Patient presents with  . Medical Managment of Chronic Issues    falling, increased confusion.   . Acute Visit    HPI: Patient is a 79 y.o. female seen in the clinic at [facility] today for evaluation of falling, increased confusion, and other chronic medical conditions.  Review of Systems:  Review of Systems  Constitutional: Negative for fever, chills, weight loss, malaise/fatigue and diaphoresis.  HENT: Positive for hearing loss. Negative for congestion, ear pain and sore throat.   Eyes: Negative for pain, discharge and redness.  Respiratory: Negative for cough, sputum production, shortness of breath and wheezing.   Cardiovascular: Negative for chest pain, orthopnea, claudication, leg swelling and PND.  Gastrointestinal: Negative for heartburn, nausea, vomiting, abdominal pain, diarrhea, constipation and blood in stool.  Genitourinary: Positive for frequency. Negative for dysuria, urgency, hematuria and flank pain.  Musculoskeletal: Positive for back pain, falls and joint pain. Negative for myalgias and neck pain.       Gait instability.   Skin: Negative for itching and rash.  Neurological: Positive for weakness (generalized. ). Negative for dizziness, tingling, tremors, sensory change, speech change, focal weakness, seizures, loss of consciousness and headaches.  Endo/Heme/Allergies: Negative for environmental allergies and polydipsia. Does not bruise/bleed easily.  Psychiatric/Behavioral: Positive for memory loss. Negative for depression and hallucinations. The patient is not nervous/anxious and does not have insomnia.      Past Medical History  Diagnosis Date  . Osteoporosis   . Allergic rhinitis   . Dementia   . Osteoarthritis     hands  . IBS (irritable bowel syndrome)   .  GERD (gastroesophageal reflux disease)   . Hypertension   . Palpitation     a. PAC's & PVC's by Holter - 2011  . Tortuous colon 2002  . Descending thoracic aortic dissection 04/24/2010    a. MRA 2011 - begins distal to left subclavian and extends throughout the full length of the Ao.  Marland Kitchen Aortic insufficiency 04/16/2010    a. Echo 2011 - mild to moderate AI, EF 60%.  . Anemia, unspecified 01/12/2013  . Other abnormal blood chemistry 01/12/2013  . Lumbago 10/06/2012  . Personal history of fall 10/06/2012  . Hyposmolality and/or hyponatremia 09/29/2012  . Otitis externa 09/29/2012  . Impacted cerumen 09/29/2012  . Unspecified conductive hearing loss 09/29/2012  . External hemorrhoids without mention of complication 09/29/2012  . Unspecified venous (peripheral) insufficiency 09/29/2012  . Unspecified constipation 09/29/2012  . Abnormality of gait 09/29/2012  . Congestive heart failure, unspecified 06/30/2003   Past Surgical History  Procedure Laterality Date  . Dilation and curettage of uterus  1960  . Cataract extraction w/ intraocular lens implant     Social History:   reports that she has never smoked. She has never used smokeless tobacco. She reports that she does not drink alcohol or use illicit drugs.  Family History  Problem Relation Age of Onset  . Diabetes Mother   . Coronary artery disease Father   . Hypertension Father   . Diabetes      sisters    Medications: Patient's Medications  New Prescriptions   No medications on file  Previous Medications   ACETAMINOPHEN (TYLENOL) 325 MG TABLET    Take 325 mg by mouth. Take  two tablets every 6 hours   AMLODIPINE (NORVASC) 5 MG TABLET    Take 1 tablet (5 mg total) by mouth daily.   ARTIFICIAL TEAR SOLUTION (TEARS NATURALE II OP)    Apply 1 drop to eye daily. Both eyes twice daily   AZELASTINE (ASTELIN) 137 MCG/SPRAY NASAL SPRAY    Place 2 sprays into the nose. At bedtime   CALCIUM CITRATE-VITAMIN D (CITRACAL+D) 315-200 MG-UNIT PER  TABLET    Take 2 tablets by mouth. Take twice daily   CELECOXIB (CELEBREX) 200 MG CAPSULE    Take 200 mg by mouth daily.   CHOLECALCIFEROL (VITAMIN D) 2000 UNITS CAPS    Take by mouth daily.     DONEPEZIL (ARICEPT) 10 MG TABLET    Take 1 tablet (10 mg total) by mouth daily.   MEMANTINE (NAMENDA) 10 MG TABLET    Take 10 mg by mouth 2 (two) times daily.   METOPROLOL (LOPRESSOR) 50 MG TABLET    Take 1 tablet (50 mg total) by mouth 2 (two) times daily.   MULTIPLE VITAMINS-MINERALS (CENTRUM PO)    Take 1 tablet by mouth 1 day or 1 dose.   OMEPRAZOLE (PRILOSEC) 20 MG CAPSULE    Take 20 mg by mouth daily.   POLYETHYLENE GLYCOL (MIRALAX / GLYCOLAX) PACKET    Take 17 g by mouth. Fill cap to 17 gm mark, mix with 4 oz of fluid and take by mouth every other day.  Modified Medications   No medications on file  Discontinued Medications   No medications on file     Physical Exam: Physical Exam  Constitutional: She appears well-developed and well-nourished. No distress.  HENT:  Head: Normocephalic and atraumatic.  Nose: Nose normal.  Mouth/Throat: Oropharynx is clear and moist. No oropharyngeal exudate.  Eyes: Conjunctivae and EOM are normal. Pupils are equal, round, and reactive to light. Right eye exhibits no discharge. Left eye exhibits no discharge. No scleral icterus.  Neck: Normal range of motion. Neck supple. No JVD present. No thyromegaly present.  Cardiovascular: Normal rate, regular rhythm and normal heart sounds.   No murmur heard. Pulmonary/Chest: Effort normal and breath sounds normal. No respiratory distress. She has no wheezes. She has no rales.  Abdominal: Soft. Bowel sounds are normal. She exhibits no distension. There is no tenderness.  Musculoskeletal: Normal range of motion. She exhibits tenderness. She exhibits no edema.  Pain in the right shoulder/shoulder blade, neck, middle/lower back-gait instability and frequent falling.   Lymphadenopathy:    She has no cervical adenopathy.   Neurological: She is alert. She has normal reflexes. No cranial nerve deficit. She exhibits normal muscle tone. Coordination normal.  Skin: Skin is warm and dry. No rash noted. She is not diaphoretic. No erythema.  Psychiatric: Her mood appears not anxious. Her affect is inappropriate. Her affect is not angry, not blunt and not labile. Her speech is delayed. Her speech is not rapid and/or pressured, not tangential and not slurred. She is slowed. She is not agitated, not aggressive, not hyperactive, not withdrawn and not actively hallucinating. Thought content is not paranoid and not delusional. Cognition and memory are impaired. She expresses impulsivity and inappropriate judgment. She does not exhibit a depressed mood. She is communicative. She exhibits abnormal recent memory. She exhibits normal remote memory. She is attentive.    Filed Vitals:   09/29/13 2310  BP: 169/90  Pulse: 78  Temp: 97.6 F (36.4 C)  TempSrc: Tympanic  Resp: 20      Labs  reviewed: Basic Metabolic Panel:  Recent Labs  16/10/96 03/09/13 05/16/13 08/15/13 09/27/13  NA 135* 136* 136* 134* 136*  K 4.4 3.9 4.1 3.5 3.7  BUN 19 17 21  25* 18  CREATININE 0.7 0.6 0.7 0.7 0.7  TSH  --  1.12  --   --   --    Liver Function Tests:  Recent Labs  03/09/13  AST 12*  ALT 8  ALKPHOS 54   CBC:  Recent Labs  03/09/13 05/16/13 07/04/13  WBC 6.8 7.3 5.7  HGB 11.6* 14.4 12.2  HCT 35* 43 35*  PLT 179 162 151    Past Procedures: 04/13/13 X-ray C/T/L spine, right shoulder/scapula: disc disease L3-L5. normal with no significant abnormalities seen the right shoulder and the right scapula. Minimal disc disease cervical spine. Old upper thoracic vertebral body compression fractures/disc disease.    Assessment/Plan DEMENTIA Progressing and frequent falling even if it was initial improvement when Namenda started. Will consider SNF for care needs. Continue Aricept and Namenda. Seems more cognitive changes since Myrbetriq  was switched to Oxybutynin.                 OVERACTIVE BLADDER C/o frequent bathroom trips that interfere her night-better since on  Myrbetriq started-no change since therapeutic exchanged to Oxybutynin      Thoracic back pain No c/o  pain in her right shoulder and shoulder blade(no decreased ROM of the right shoulder) and lower back today.  Hx of middle of her spine vertebral body got out of alignment from falling on staircase in 1950--chronic on and off pain since the event. Celebrex 200mg  daily for pain, Prilosec 20mg  daily for GI protection--pain is better controlled.             Hypertension Controlled on Amlodipine 5mg  and Metoprolol 50mg  bid.             Unspecified constipation C/o loose bowels--hx IBS, resolved since MiraLax prn and dded Colace 100mg  bid       Gait disorder Worse-POA declined w/c.   Falling Frequent falling related to lack of safety awareness and increased frailty. SNF care needed.     Family/ Staff Communication: observe the patient.   Goals of Care: AL  Labs/tests ordered: none

## 2013-09-29 NOTE — Assessment & Plan Note (Signed)
Worse-POA declined w/c.

## 2013-10-02 DIAGNOSIS — M25569 Pain in unspecified knee: Secondary | ICD-10-CM | POA: Diagnosis not present

## 2013-10-02 DIAGNOSIS — R29818 Other symptoms and signs involving the nervous system: Secondary | ICD-10-CM | POA: Diagnosis not present

## 2013-10-02 DIAGNOSIS — R262 Difficulty in walking, not elsewhere classified: Secondary | ICD-10-CM | POA: Diagnosis not present

## 2013-10-02 DIAGNOSIS — Z9181 History of falling: Secondary | ICD-10-CM | POA: Diagnosis not present

## 2013-10-04 ENCOUNTER — Encounter: Payer: Self-pay | Admitting: Nurse Practitioner

## 2013-10-04 ENCOUNTER — Non-Acute Institutional Stay (SKILLED_NURSING_FACILITY): Payer: Medicare Other | Admitting: Nurse Practitioner

## 2013-10-04 DIAGNOSIS — N318 Other neuromuscular dysfunction of bladder: Secondary | ICD-10-CM | POA: Diagnosis not present

## 2013-10-04 DIAGNOSIS — D649 Anemia, unspecified: Secondary | ICD-10-CM

## 2013-10-04 DIAGNOSIS — M159 Polyosteoarthritis, unspecified: Secondary | ICD-10-CM | POA: Diagnosis not present

## 2013-10-04 DIAGNOSIS — K219 Gastro-esophageal reflux disease without esophagitis: Secondary | ICD-10-CM | POA: Diagnosis not present

## 2013-10-04 DIAGNOSIS — M25569 Pain in unspecified knee: Secondary | ICD-10-CM | POA: Diagnosis not present

## 2013-10-04 DIAGNOSIS — R29818 Other symptoms and signs involving the nervous system: Secondary | ICD-10-CM | POA: Diagnosis not present

## 2013-10-04 DIAGNOSIS — I10 Essential (primary) hypertension: Secondary | ICD-10-CM | POA: Diagnosis not present

## 2013-10-04 DIAGNOSIS — R262 Difficulty in walking, not elsewhere classified: Secondary | ICD-10-CM | POA: Diagnosis not present

## 2013-10-04 DIAGNOSIS — Z9181 History of falling: Secondary | ICD-10-CM | POA: Diagnosis not present

## 2013-10-04 DIAGNOSIS — F068 Other specified mental disorders due to known physiological condition: Secondary | ICD-10-CM

## 2013-10-04 DIAGNOSIS — K5900 Constipation, unspecified: Secondary | ICD-10-CM

## 2013-10-04 NOTE — Assessment & Plan Note (Signed)
IDA and chronic disease--takes Fe. Hgb 11.6 03/09/13-Hgb 14.4 05/16/13, 12.2 07/04/13, off Iron Update CBC

## 2013-10-04 NOTE — Assessment & Plan Note (Signed)
Controlled on Amlodipine 5mg and Metoprolol 50mg bid.     

## 2013-10-04 NOTE — Assessment & Plan Note (Signed)
Stable on Prilosec 20 mg daily. 

## 2013-10-04 NOTE — Assessment & Plan Note (Addendum)
No c/o  pain in her right shoulder and shoulder blade(no decreased ROM of the right shoulder) and lower back today.  Hx of middle of her spine vertebral body got out of alignment from falling on staircase in 1950--chronic on and off pain since the event. Tylenol 650 q6hr and Celebrex 200mg  daily for pain, Prilosec 20mg  daily for GI protection--pain is better controlled.

## 2013-10-04 NOTE — Assessment & Plan Note (Signed)
C/o loose bowels--hx IBS, resolved since MiraLax prn and dded Colace 100mg bid    

## 2013-10-04 NOTE — Assessment & Plan Note (Addendum)
Progressing and frequent falling even if it was initial improvement when Namenda started. SNF for care needs. Continue Aricept and Namenda. Seems more cognitive changes since Myrbetriq was switched to Oxybutynin. Update TSH

## 2013-10-04 NOTE — Progress Notes (Signed)
Patient ID: Jamie [name], female   DOB: 05/07/1920, 79 y.o.   MRN: [id number]  Code Status: DNR  Allergies  Allergen Reactions  . Alendronate Sodium     REACTION: GI    Chief Complaint  Patient presents with  . Medical Managment of Chronic Issues  . Dementia    HPI: Patient is a 79 y.o. female seen in the SNF at [facility] today for evaluation of falling, increased confusion, and other chronic medical conditions. She was admitted to [facility] 10/02/13 for higher level of care related to progression of dementia and increased physical frailty. She is no longer self sufficient in AL setting. She has been treated for urinary frequency, GERD, vitamin D deficiency, dementia, multiple sites OA, abnormal gait, constipation, HTN.    Review of Systems:  Review of Systems  Constitutional: Negative for fever, chills, weight loss, malaise/fatigue and diaphoresis.  HENT: Positive for hearing loss. Negative for congestion, ear pain and sore throat.   Eyes: Negative for pain, discharge and redness.  Respiratory: Negative for cough, sputum production, shortness of breath and wheezing.   Cardiovascular: Negative for chest pain, orthopnea, claudication, leg swelling and PND.  Gastrointestinal: Negative for heartburn, nausea, vomiting, abdominal pain, diarrhea, constipation and blood in stool.  Genitourinary: Positive for frequency. Negative for dysuria, urgency, hematuria and flank pain.  Musculoskeletal: Positive for back pain, falls and joint pain. Negative for myalgias and neck pain.       Gait instability.   Skin: Negative for itching and rash.  Neurological: Positive for weakness (generalized. ). Negative for dizziness, tingling, tremors, sensory change, speech change, focal weakness, seizures, loss of consciousness and headaches.  Endo/Heme/Allergies: Negative for environmental allergies and polydipsia. Does not bruise/bleed easily.  Psychiatric/Behavioral: Positive for memory loss. Negative  for depression and hallucinations. The patient is not nervous/anxious and does not have insomnia.      Past Medical History  Diagnosis Date  . Osteoporosis   . Allergic rhinitis   . Dementia   . Osteoarthritis     hands  . IBS (irritable bowel syndrome)   . GERD (gastroesophageal reflux disease)   . Hypertension   . Palpitation     a. PAC's & PVC's by Holter - 2011  . Tortuous colon 2002  . Descending thoracic aortic dissection 04/24/2010    a. MRA 2011 - begins distal to left subclavian and extends throughout the full length of the Ao.  Marland Kitchen Aortic insufficiency 04/16/2010    a. Echo 2011 - mild to moderate AI, EF 60%.  . Anemia, unspecified 01/12/2013  . Other abnormal blood chemistry 01/12/2013  . Lumbago 10/06/2012  . Personal history of fall 10/06/2012  . Hyposmolality and/or hyponatremia 09/29/2012  . Otitis externa 09/29/2012  . Impacted cerumen 09/29/2012  . Unspecified conductive hearing loss 09/29/2012  . External hemorrhoids without mention of complication 09/29/2012  . Unspecified venous (peripheral) insufficiency 09/29/2012  . Unspecified constipation 09/29/2012  . Abnormality of gait 09/29/2012  . Congestive heart failure, unspecified 06/30/2003   Past Surgical History  Procedure Laterality Date  . Dilation and curettage of uterus  1960  . Cataract extraction w/ intraocular lens implant     Social History:   reports that she has never smoked. She has never used smokeless tobacco. She reports that she does not drink alcohol or use illicit drugs.  Family History  Problem Relation Age of Onset  . Diabetes Mother   . Coronary artery disease Father   . Hypertension Father   .  Diabetes      sisters    Medications: Patient's Medications  New Prescriptions   No medications on file  Previous Medications   ACETAMINOPHEN (TYLENOL) 325 MG TABLET    Take 325 mg by mouth. Take two tablets every 6 hours   AMLODIPINE (NORVASC) 5 MG TABLET    Take 1 tablet (5 mg total) by mouth  daily.   ARTIFICIAL TEAR SOLUTION (TEARS NATURALE II OP)    Apply 1 drop to eye daily. Both eyes twice daily   AZELASTINE (ASTELIN) 137 MCG/SPRAY NASAL SPRAY    Place 2 sprays into the nose. At bedtime   CALCIUM CITRATE-VITAMIN D (CITRACAL+D) 315-200 MG-UNIT PER TABLET    Take 2 tablets by mouth. Take twice daily   CELECOXIB (CELEBREX) 200 MG CAPSULE    Take 200 mg by mouth daily.   CHOLECALCIFEROL (VITAMIN D) 2000 UNITS CAPS    Take by mouth daily.     DONEPEZIL (ARICEPT) 10 MG TABLET    Take 1 tablet (10 mg total) by mouth daily.   MEMANTINE (NAMENDA) 10 MG TABLET    Take 10 mg by mouth 2 (two) times daily.   METOPROLOL (LOPRESSOR) 50 MG TABLET    Take 1 tablet (50 mg total) by mouth 2 (two) times daily.   MULTIPLE VITAMINS-MINERALS (CENTRUM PO)    Take 1 tablet by mouth 1 day or 1 dose.   OMEPRAZOLE (PRILOSEC) 20 MG CAPSULE    Take 20 mg by mouth daily.   POLYETHYLENE GLYCOL (MIRALAX / GLYCOLAX) PACKET    Take 17 g by mouth. Fill cap to 17 gm mark, mix with 4 oz of fluid and take by mouth every other day.  Modified Medications   No medications on file  Discontinued Medications   No medications on file     Physical Exam: Physical Exam  Constitutional: She appears well-developed and well-nourished. No distress.  HENT:  Head: Normocephalic and atraumatic.  Nose: Nose normal.  Mouth/Throat: Oropharynx is clear and moist. No oropharyngeal exudate.  Eyes: Conjunctivae and EOM are normal. Pupils are equal, round, and reactive to light. Right eye exhibits no discharge. Left eye exhibits no discharge. No scleral icterus.  Neck: Normal range of motion. Neck supple. No JVD present. No thyromegaly present.  Cardiovascular: Normal rate, regular rhythm and normal heart sounds.   No murmur heard. Pulmonary/Chest: Effort normal and breath sounds normal. No respiratory distress. She has no wheezes. She has no rales.  Abdominal: Soft. Bowel sounds are normal. She exhibits no distension. There is no  tenderness.  Musculoskeletal: Normal range of motion. She exhibits tenderness. She exhibits no edema.  Pain in the right shoulder/shoulder blade, neck, middle/lower back-gait instability and frequent falling.   Lymphadenopathy:    She has no cervical adenopathy.  Neurological: She is alert. She has normal reflexes. No cranial nerve deficit. She exhibits normal muscle tone. Coordination normal.  Skin: Skin is warm and dry. No rash noted. She is not diaphoretic. No erythema.  Psychiatric: Her mood appears not anxious. Her affect is inappropriate. Her affect is not angry, not blunt and not labile. Her speech is delayed. Her speech is not rapid and/or pressured, not tangential and not slurred. She is slowed. She is not agitated, not aggressive, not hyperactive, not withdrawn and not actively hallucinating. Thought content is not paranoid and not delusional. Cognition and memory are impaired. She expresses impulsivity and inappropriate judgment. She does not exhibit a depressed mood. She is communicative. She exhibits abnormal recent memory.  She exhibits normal remote memory. She is attentive.    Filed Vitals:   10/04/13 1205  BP: 150/80  Pulse: 72  Temp: 98.5 F (36.9 C)  TempSrc: Tympanic  Resp: 18      Labs reviewed: Basic Metabolic Panel:  Recent Labs  16/10/96 03/09/13 05/16/13 08/15/13 09/27/13  NA 135* 136* 136* 134* 136*  K 4.4 3.9 4.1 3.5 3.7  BUN 19 17 21  25* 18  CREATININE 0.7 0.6 0.7 0.7 0.7  TSH  --  1.12  --   --   --    Liver Function Tests:  Recent Labs  03/09/13  AST 12*  ALT 8  ALKPHOS 54   CBC:  Recent Labs  03/09/13 05/16/13 07/04/13  WBC 6.8 7.3 5.7  HGB 11.6* 14.4 12.2  HCT 35* 43 35*  PLT 179 162 151    Past Procedures: 04/13/13 X-ray C/T/L spine, right shoulder/scapula: disc disease L3-L5. normal with no significant abnormalities seen the right shoulder and the right scapula. Minimal disc disease cervical spine. Old upper thoracic vertebral body  compression fractures/disc disease.    Assessment/Plan OVERACTIVE BLADDER C/o frequent bathroom trips that interfere her night-better since on  Myrbetriq started-no change since therapeutic exchanged to Oxybutynin        GERD (gastroesophageal reflux disease) Stable on Prilosec 20mg  daily.       Hypertension Controlled on Amlodipine 5mg  and Metoprolol 50mg  bid.               OSTEOARTHROSIS, GENERALIZED, MULTIPLE SITES No c/o  pain in her right shoulder and shoulder blade(no decreased ROM of the right shoulder) and lower back today.  Hx of middle of her spine vertebral body got out of alignment from falling on staircase in 1950--chronic on and off pain since the event. Tylenol 650 q6hr and Celebrex 200mg  daily for pain, Prilosec 20mg  daily for GI protection--pain is better controlled.               DEMENTIA Progressing and frequent falling even if it was initial improvement when Namenda started. SNF for care needs. Continue Aricept and Namenda. Seems more cognitive changes since Myrbetriq was switched to Oxybutynin. Update TSH                  Unspecified constipation C/o loose bowels--hx IBS, resolved since MiraLax prn and dded Colace 100mg  bid         Anemia IDA and chronic disease--takes Fe. Hgb 11.6 03/09/13-Hgb 14.4 05/16/13, 12.2 07/04/13, off Iron Update CBC                Family/ Staff Communication: observe the patient.   Goals of Care: SNF  Labs/tests ordered: CBC and TSH

## 2013-10-04 NOTE — Assessment & Plan Note (Signed)
C/o frequent bathroom trips that interfere her night-better since on  Myrbetriq started-no change since therapeutic exchanged to Oxybutynin

## 2013-10-05 DIAGNOSIS — E039 Hypothyroidism, unspecified: Secondary | ICD-10-CM | POA: Diagnosis not present

## 2013-10-05 DIAGNOSIS — M25569 Pain in unspecified knee: Secondary | ICD-10-CM | POA: Diagnosis not present

## 2013-10-05 DIAGNOSIS — D649 Anemia, unspecified: Secondary | ICD-10-CM | POA: Diagnosis not present

## 2013-10-05 DIAGNOSIS — R262 Difficulty in walking, not elsewhere classified: Secondary | ICD-10-CM | POA: Diagnosis not present

## 2013-10-05 DIAGNOSIS — Z9181 History of falling: Secondary | ICD-10-CM | POA: Diagnosis not present

## 2013-10-05 DIAGNOSIS — R29818 Other symptoms and signs involving the nervous system: Secondary | ICD-10-CM | POA: Diagnosis not present

## 2013-10-05 LAB — CBC AND DIFFERENTIAL
Hemoglobin: 12.4 g/dL (ref 12.0–16.0)
WBC: 5.8 10^3/mL

## 2013-10-09 DIAGNOSIS — M25569 Pain in unspecified knee: Secondary | ICD-10-CM | POA: Diagnosis not present

## 2013-10-09 DIAGNOSIS — R262 Difficulty in walking, not elsewhere classified: Secondary | ICD-10-CM | POA: Diagnosis not present

## 2013-10-09 DIAGNOSIS — Z9181 History of falling: Secondary | ICD-10-CM | POA: Diagnosis not present

## 2013-10-09 DIAGNOSIS — R29818 Other symptoms and signs involving the nervous system: Secondary | ICD-10-CM | POA: Diagnosis not present

## 2013-10-10 DIAGNOSIS — R262 Difficulty in walking, not elsewhere classified: Secondary | ICD-10-CM | POA: Diagnosis not present

## 2013-10-10 DIAGNOSIS — Z9181 History of falling: Secondary | ICD-10-CM | POA: Diagnosis not present

## 2013-10-10 DIAGNOSIS — M25569 Pain in unspecified knee: Secondary | ICD-10-CM | POA: Diagnosis not present

## 2013-10-10 DIAGNOSIS — R29818 Other symptoms and signs involving the nervous system: Secondary | ICD-10-CM | POA: Diagnosis not present

## 2013-10-11 ENCOUNTER — Encounter: Payer: Self-pay | Admitting: Nurse Practitioner

## 2013-10-11 ENCOUNTER — Non-Acute Institutional Stay (SKILLED_NURSING_FACILITY): Payer: Medicare Other | Admitting: Nurse Practitioner

## 2013-10-11 DIAGNOSIS — I10 Essential (primary) hypertension: Secondary | ICD-10-CM | POA: Diagnosis not present

## 2013-10-11 DIAGNOSIS — R634 Abnormal weight loss: Secondary | ICD-10-CM

## 2013-10-11 DIAGNOSIS — R4182 Altered mental status, unspecified: Secondary | ICD-10-CM | POA: Diagnosis not present

## 2013-10-11 DIAGNOSIS — K219 Gastro-esophageal reflux disease without esophagitis: Secondary | ICD-10-CM

## 2013-10-11 DIAGNOSIS — R29818 Other symptoms and signs involving the nervous system: Secondary | ICD-10-CM | POA: Diagnosis not present

## 2013-10-11 DIAGNOSIS — M546 Pain in thoracic spine: Secondary | ICD-10-CM

## 2013-10-11 DIAGNOSIS — M25569 Pain in unspecified knee: Secondary | ICD-10-CM | POA: Diagnosis not present

## 2013-10-11 DIAGNOSIS — Z9181 History of falling: Secondary | ICD-10-CM | POA: Diagnosis not present

## 2013-10-11 DIAGNOSIS — R262 Difficulty in walking, not elsewhere classified: Secondary | ICD-10-CM | POA: Diagnosis not present

## 2013-10-11 DIAGNOSIS — N318 Other neuromuscular dysfunction of bladder: Secondary | ICD-10-CM | POA: Diagnosis not present

## 2013-10-11 DIAGNOSIS — F068 Other specified mental disorders due to known physiological condition: Secondary | ICD-10-CM

## 2013-10-11 DIAGNOSIS — N390 Urinary tract infection, site not specified: Secondary | ICD-10-CM | POA: Diagnosis not present

## 2013-10-11 NOTE — Assessment & Plan Note (Signed)
No c/o  pain in her right shoulder and shoulder blade(no decreased ROM of the right shoulder) and lower back today.  Hx of middle of her spine vertebral body got out of alignment from falling on staircase in 1950--chronic on and off pain since the event. Better with Tylenol 650mg qid and Celebrex 200mg daily for pain, Prilosec 20mg daily for GI protection--pain is better controlled.                

## 2013-10-11 NOTE — Assessment & Plan Note (Signed)
Progressing and frequent falling even if it was initial improvement when Namenda started. SNF for care needs. Continue Aricept and Namenda. Seems more cognitive changes since Myrbetriq was switched to Oxybutynin(put on hold). Repeat MMSE. UA pending.

## 2013-10-11 NOTE — Assessment & Plan Note (Signed)
UA C/S pending. CBC and TSH 10/05/13, CMP 09/27/13 unremarkable

## 2013-10-11 NOTE — Progress Notes (Signed)
Patient ID: Jamie [name], female   DOB: 12-12-1919, 79 y.o.   MRN: [id number]  Code Status: DNR  Allergies  Allergen Reactions  . Alendronate Sodium     REACTION: GI    Chief Complaint  Patient presents with  . Medical Managment of Chronic Issues    weight loss  . Altered Mental Status  . Acute Visit    HPI: Patient is a 79 y.o. female seen in the SNF at [facility] today for evaluation of falling, increased confusion, and other chronic medical conditions.   Review of Systems:  Review of Systems  Constitutional: Negative for fever, chills, weight loss, malaise/fatigue and diaphoresis.  HENT: Positive for hearing loss. Negative for congestion, ear pain and sore throat.   Eyes: Negative for pain, discharge and redness.  Respiratory: Negative for cough, sputum production, shortness of breath and wheezing.   Cardiovascular: Negative for chest pain, orthopnea, claudication, leg swelling and PND.  Gastrointestinal: Negative for heartburn, nausea, vomiting, abdominal pain, diarrhea, constipation and blood in stool.  Genitourinary: Positive for frequency. Negative for dysuria, urgency, hematuria and flank pain.  Musculoskeletal: Positive for back pain, falls and joint pain. Negative for myalgias and neck pain.       Gait instability.   Skin: Negative for itching and rash.  Neurological: Positive for weakness (generalized. ). Negative for dizziness, tingling, tremors, sensory change, speech change, focal weakness, seizures, loss of consciousness and headaches.  Endo/Heme/Allergies: Negative for environmental allergies and polydipsia. Does not bruise/bleed easily.  Psychiatric/Behavioral: Positive for memory loss. Negative for depression and hallucinations. The patient is not nervous/anxious and does not have insomnia.      Past Medical History  Diagnosis Date  . Osteoporosis   . Allergic rhinitis   . Dementia   . Osteoarthritis     hands  . IBS (irritable bowel syndrome)    . GERD (gastroesophageal reflux disease)   . Hypertension   . Palpitation     a. PAC's & PVC's by Holter - 2011  . Tortuous colon 2002  . Descending thoracic aortic dissection 04/24/2010    a. MRA 2011 - begins distal to left subclavian and extends throughout the full length of the Ao.  Marland Kitchen Aortic insufficiency 04/16/2010    a. Echo 2011 - mild to moderate AI, EF 60%.  . Anemia, unspecified 01/12/2013  . Other abnormal blood chemistry 01/12/2013  . Lumbago 10/06/2012  . Personal history of fall 10/06/2012  . Hyposmolality and/or hyponatremia 09/29/2012  . Otitis externa 09/29/2012  . Impacted cerumen 09/29/2012  . Unspecified conductive hearing loss 09/29/2012  . External hemorrhoids without mention of complication 09/29/2012  . Unspecified venous (peripheral) insufficiency 09/29/2012  . Unspecified constipation 09/29/2012  . Abnormality of gait 09/29/2012  . Congestive heart failure, unspecified 06/30/2003   Past Surgical History  Procedure Laterality Date  . Dilation and curettage of uterus  1960  . Cataract extraction w/ intraocular lens implant     Social History:   reports that she has never smoked. She has never used smokeless tobacco. She reports that she does not drink alcohol or use illicit drugs.  Family History  Problem Relation Age of Onset  . Diabetes Mother   . Coronary artery disease Father   . Hypertension Father   . Diabetes      sisters    Medications: Patient's Medications  New Prescriptions   No medications on file  Previous Medications   ACETAMINOPHEN (TYLENOL) 325 MG TABLET    Take 325  mg by mouth. Take two tablets every 6 hours   AMLODIPINE (NORVASC) 5 MG TABLET    Take 1 tablet (5 mg total) by mouth daily.   ARTIFICIAL TEAR SOLUTION (TEARS NATURALE II OP)    Apply 1 drop to eye daily. Both eyes twice daily   AZELASTINE (ASTELIN) 137 MCG/SPRAY NASAL SPRAY    Place 2 sprays into the nose. At bedtime   CALCIUM CITRATE-VITAMIN D (CITRACAL+D) 315-200 MG-UNIT PER  TABLET    Take 2 tablets by mouth. Take twice daily   CELECOXIB (CELEBREX) 200 MG CAPSULE    Take 200 mg by mouth daily.   CHOLECALCIFEROL (VITAMIN D) 2000 UNITS CAPS    Take by mouth daily.     DONEPEZIL (ARICEPT) 10 MG TABLET    Take 1 tablet (10 mg total) by mouth daily.   MEMANTINE (NAMENDA) 10 MG TABLET    Take 10 mg by mouth 2 (two) times daily.   METOPROLOL (LOPRESSOR) 50 MG TABLET    Take 1 tablet (50 mg total) by mouth 2 (two) times daily.   MIRTAZAPINE (REMERON) 7.5 MG TABLET    Take 7.5 mg by mouth at bedtime.   MULTIPLE VITAMINS-MINERALS (CENTRUM PO)    Take 1 tablet by mouth 1 day or 1 dose.   OMEPRAZOLE (PRILOSEC) 20 MG CAPSULE    Take 20 mg by mouth daily.   POLYETHYLENE GLYCOL (MIRALAX / GLYCOLAX) PACKET    Take 17 g by mouth. Fill cap to 17 gm mark, mix with 4 oz of fluid and take by mouth every other day.  Modified Medications   No medications on file  Discontinued Medications   No medications on file     Physical Exam: Physical Exam  Constitutional: She appears well-developed and well-nourished. No distress.  HENT:  Head: Normocephalic and atraumatic.  Nose: Nose normal.  Mouth/Throat: Oropharynx is clear and moist. No oropharyngeal exudate.  Eyes: Conjunctivae and EOM are normal. Pupils are equal, round, and reactive to light. Right eye exhibits no discharge. Left eye exhibits no discharge. No scleral icterus.  Neck: Normal range of motion. Neck supple. No JVD present. No thyromegaly present.  Cardiovascular: Normal rate, regular rhythm and normal heart sounds.   No murmur heard. Pulmonary/Chest: Effort normal and breath sounds normal. No respiratory distress. She has no wheezes. She has no rales.  Abdominal: Soft. Bowel sounds are normal. She exhibits no distension. There is no tenderness.  Musculoskeletal: Normal range of motion. She exhibits tenderness. She exhibits no edema.  Pain in the right shoulder/shoulder blade, neck, middle/lower back-gait instability and  frequent falling.   Lymphadenopathy:    She has no cervical adenopathy.  Neurological: She is alert. She has normal reflexes. No cranial nerve deficit. She exhibits normal muscle tone. Coordination normal.  Skin: Skin is warm and dry. No rash noted. She is not diaphoretic. No erythema.  Psychiatric: Her mood appears not anxious. Her affect is inappropriate. Her affect is not angry, not blunt and not labile. Her speech is delayed. Her speech is not rapid and/or pressured, not tangential and not slurred. She is slowed. She is not agitated, not aggressive, not hyperactive, not withdrawn and not actively hallucinating. Thought content is not paranoid and not delusional. Cognition and memory are impaired. She expresses impulsivity and inappropriate judgment. She does not exhibit a depressed mood. She is communicative. She exhibits abnormal recent memory. She exhibits normal remote memory. She is attentive.    Filed Vitals:   10/11/13 1225  BP: 128/70  Pulse: 62  Temp: 97.7 F (36.5 C)  TempSrc: Tympanic  Resp: 18      Labs reviewed: Basic Metabolic Panel:  Recent Labs  62/13/08 03/09/13 05/16/13 08/15/13 09/27/13 10/05/13  NA 135* 136* 136* 134* 136*  --   K 4.4 3.9 4.1 3.5 3.7  --   BUN 19 17 21  25* 18  --   CREATININE 0.7 0.6 0.7 0.7 0.7  --   TSH  --  1.12  --   --   --  1.12   Liver Function Tests:  Recent Labs  03/09/13  AST 12*  ALT 8  ALKPHOS 54   CBC:  Recent Labs  05/16/13 07/04/13 10/05/13  WBC 7.3 5.7 5.8  HGB 14.4 12.2 12.4  HCT 43 35* 37  PLT 162 151 152    Past Procedures: 04/13/13 X-ray C/T/L spine, right shoulder/scapula: disc disease L3-L5. normal with no significant abnormalities seen the right shoulder and the right scapula. Minimal disc disease cervical spine. Old upper thoracic vertebral body compression fractures/disc disease.    Assessment/Plan WEIGHT LOSS Gradual and ongoing issue in the past year. Will monitor weight closely, dietary  supplement, and Mirtazapine 7.5mg  nightly.   Altered mental status UA C/S pending. CBC and TSH 10/05/13, CMP 09/27/13 unremarkable  OVERACTIVE BLADDER C/o frequent bathroom trips that interfere her night-better since on  Myrbetriq started-no change since therapeutic exchanged to Oxybutynin, but seems more confusion than prior-will hold Oxybutynin for now-observe the patient.           Hypertension Controlled on Amlodipine 5mg  and Metoprolol 50mg  bid.                 GERD (gastroesophageal reflux disease) Stable on Prilosec 20mg  daily.         DEMENTIA Progressing and frequent falling even if it was initial improvement when Namenda started. SNF for care needs. Continue Aricept and Namenda. Seems more cognitive changes since Myrbetriq was switched to Oxybutynin(put on hold). Repeat MMSE. UA pending.                     Thoracic back pain No c/o  pain in her right shoulder and shoulder blade(no decreased ROM of the right shoulder) and lower back today.  Hx of middle of her spine vertebral body got out of alignment from falling on staircase in 1950--chronic on and off pain since the event. Better with Tylenol 650mg  qid and Celebrex 200mg  daily for pain, Prilosec 20mg  daily for GI protection--pain is better controlled.                 Family/ Staff Communication: observe the patient.   Goals of Care: SNF  Labs/tests ordered: UA C/S pending. Obtain MMSE

## 2013-10-11 NOTE — Assessment & Plan Note (Signed)
Stable on Prilosec 20 mg daily. 

## 2013-10-11 NOTE — Assessment & Plan Note (Signed)
Gradual and ongoing issue in the past year. Will monitor weight closely, dietary supplement, and Mirtazapine 7.5mg  nightly.

## 2013-10-11 NOTE — Assessment & Plan Note (Signed)
C/o frequent bathroom trips that interfere her night-better since on  Myrbetriq started-no change since therapeutic exchanged to Oxybutynin, but seems more confusion than prior-will hold Oxybutynin for now-observe the patient.

## 2013-10-11 NOTE — Assessment & Plan Note (Signed)
Controlled on Amlodipine 5mg and Metoprolol 50mg bid.     

## 2013-11-06 ENCOUNTER — Encounter: Payer: Self-pay | Admitting: Nurse Practitioner

## 2013-11-06 ENCOUNTER — Non-Acute Institutional Stay (SKILLED_NURSING_FACILITY): Payer: Medicare Other | Admitting: Nurse Practitioner

## 2013-11-06 DIAGNOSIS — M159 Polyosteoarthritis, unspecified: Secondary | ICD-10-CM

## 2013-11-06 DIAGNOSIS — K5900 Constipation, unspecified: Secondary | ICD-10-CM

## 2013-11-06 DIAGNOSIS — I10 Essential (primary) hypertension: Secondary | ICD-10-CM

## 2013-11-06 DIAGNOSIS — N318 Other neuromuscular dysfunction of bladder: Secondary | ICD-10-CM

## 2013-11-06 DIAGNOSIS — F068 Other specified mental disorders due to known physiological condition: Secondary | ICD-10-CM | POA: Diagnosis not present

## 2013-11-06 NOTE — Assessment & Plan Note (Signed)
No c/o  pain in her right shoulder and shoulder blade(no decreased ROM of the right shoulder) and lower back today.  Hx of middle of her spine vertebral body got out of alignment from falling on staircase in 1950--chronic on and off pain since the event. Tylenol 650 q6hr and Celebrex 200mg daily for pain, Prilosec 20mg daily for GI protection--pain is better controlled.              

## 2013-11-06 NOTE — Assessment & Plan Note (Signed)
Controlled on Amlodipine 5mg and Metoprolol 50mg bid.     

## 2013-11-06 NOTE — Assessment & Plan Note (Signed)
C/o loose bowels--hx IBS, resolved since MiraLax prn and dded Colace 100mg bid    

## 2013-11-06 NOTE — Assessment & Plan Note (Addendum)
Progressing and frequent falling, now in SNF for care. Continue Aricept and Namenda. dc'd Oxybutynin didn't change her baseline confusion. It was not related to UTI-Urine culture 10/13/13 showed no growth.

## 2013-11-06 NOTE — Assessment & Plan Note (Signed)
C/o frequent bathroom trips that interfere her night-better since on  Myrbetriq started-no change since therapeutic exchanged to Oxybutynin, but seems more confusion than prior-dc'd Oxybutynin-no change in her symptoms.  

## 2013-11-06 NOTE — Progress Notes (Signed)
Patient ID: Jamie [name], female   DOB: [birth year], 79 y.o.   MRN: [id number]  Code Status: DNR  Allergies  Allergen Reactions  . Alendronate Sodium     REACTION: GI    Chief Complaint  Patient presents with  . Medical Managment of Chronic Issues  . Dementia    HPI: Patient is a 79 y.o. female seen in the SNF at [facility] today for evaluation of her chronic medical conditions.   Review of Systems:  Review of Systems  Constitutional: Negative for fever, chills, weight loss, malaise/fatigue and diaphoresis.  HENT: Positive for hearing loss. Negative for congestion, ear pain and sore throat.   Eyes: Negative for pain, discharge and redness.  Respiratory: Negative for cough, sputum production, shortness of breath and wheezing.   Cardiovascular: Negative for chest pain, orthopnea, claudication, leg swelling and PND.  Gastrointestinal: Negative for heartburn, nausea, vomiting, abdominal pain, diarrhea, constipation and blood in stool.  Genitourinary: Positive for frequency. Negative for dysuria, urgency, hematuria and flank pain.  Musculoskeletal: Positive for back pain, falls and joint pain. Negative for myalgias and neck pain.       Gait instability.   Skin: Negative for itching and rash.  Neurological: Positive for weakness (generalized. ). Negative for dizziness, tingling, tremors, sensory change, speech change, focal weakness, seizures, loss of consciousness and headaches.  Endo/Heme/Allergies: Negative for environmental allergies and polydipsia. Does not bruise/bleed easily.  Psychiatric/Behavioral: Positive for memory loss. Negative for depression and hallucinations. The patient is not nervous/anxious and does not have insomnia.      Past Medical History  Diagnosis Date  . Osteoporosis   . Allergic rhinitis   . Dementia   . Osteoarthritis     hands  . IBS (irritable bowel syndrome)   . GERD (gastroesophageal reflux disease)   . Hypertension   .  Palpitation     a. PAC's & PVC's by Holter - 2011  . Tortuous colon 2002  . Descending thoracic aortic dissection 04/24/2010    a. MRA 2011 - begins distal to left subclavian and extends throughout the full length of the Ao.  Marland Kitchen Aortic insufficiency 04/16/2010    a. Echo 2011 - mild to moderate AI, EF 60%.  . Anemia, unspecified 01/12/2013  . Other abnormal blood chemistry 01/12/2013  . Lumbago 10/06/2012  . Personal history of fall 10/06/2012  . Hyposmolality and/or hyponatremia 09/29/2012  . Otitis externa 09/29/2012  . Impacted cerumen 09/29/2012  . Unspecified conductive hearing loss 09/29/2012  . External hemorrhoids without mention of complication 09/29/2012  . Unspecified venous (peripheral) insufficiency 09/29/2012  . Unspecified constipation 09/29/2012  . Abnormality of gait 09/29/2012  . Congestive heart failure, unspecified 06/30/2003   Past Surgical History  Procedure Laterality Date  . Dilation and curettage of uterus  1960  . Cataract extraction w/ intraocular lens implant     Social History:   reports that she has never smoked. She has never used smokeless tobacco. She reports that she does not drink alcohol or use illicit drugs.  Family History  Problem Relation Age of Onset  . Diabetes Mother   . Coronary artery disease Father   . Hypertension Father   . Diabetes      sisters    Medications: Patient's Medications  New Prescriptions   No medications on file  Previous Medications   ACETAMINOPHEN (TYLENOL) 325 MG TABLET    Take 325 mg by mouth. Take two tablets every 6 hours   AMLODIPINE (NORVASC)  5 MG TABLET    Take 1 tablet (5 mg total) by mouth daily.   ARTIFICIAL TEAR SOLUTION (TEARS NATURALE II OP)    Apply 1 drop to eye daily. Both eyes twice daily   AZELASTINE (ASTELIN) 137 MCG/SPRAY NASAL SPRAY    Place 2 sprays into the nose. At bedtime   CALCIUM CITRATE-VITAMIN D (CITRACAL+D) 315-200 MG-UNIT PER TABLET    Take 2 tablets by mouth. Take twice daily   CELECOXIB  (CELEBREX) 200 MG CAPSULE    Take 200 mg by mouth daily.   CHOLECALCIFEROL (VITAMIN D) 2000 UNITS CAPS    Take by mouth daily.     DOCUSATE SODIUM (COLACE) 100 MG CAPSULE    Take 100 mg by mouth 2 (two) times daily.   DONEPEZIL (ARICEPT) 10 MG TABLET    Take 1 tablet (10 mg total) by mouth daily.   MEMANTINE (NAMENDA) 10 MG TABLET    Take 10 mg by mouth 2 (two) times daily.   METOPROLOL (LOPRESSOR) 50 MG TABLET    Take 1 tablet (50 mg total) by mouth 2 (two) times daily.   MIRTAZAPINE (REMERON) 7.5 MG TABLET    Take 7.5 mg by mouth at bedtime.   MULTIPLE VITAMINS-MINERALS (CENTRUM PO)    Take 1 tablet by mouth 1 day or 1 dose.   OMEPRAZOLE (PRILOSEC) 20 MG CAPSULE    Take 20 mg by mouth daily.   POLYETHYLENE GLYCOL (MIRALAX / GLYCOLAX) PACKET    Take 17 g by mouth. Fill cap to 17 gm mark, mix with 4 oz of fluid and take by mouth every other day.  Modified Medications   No medications on file  Discontinued Medications   No medications on file     Physical Exam: Physical Exam  Constitutional: She appears well-developed and well-nourished. No distress.  HENT:  Head: Normocephalic and atraumatic.  Nose: Nose normal.  Mouth/Throat: Oropharynx is clear and moist. No oropharyngeal exudate.  Eyes: Conjunctivae and EOM are normal. Pupils are equal, round, and reactive to light. Right eye exhibits no discharge. Left eye exhibits no discharge. No scleral icterus.  Neck: Normal range of motion. Neck supple. No JVD present. No thyromegaly present.  Cardiovascular: Normal rate, regular rhythm and normal heart sounds.   No murmur heard. Pulmonary/Chest: Effort normal and breath sounds normal. No respiratory distress. She has no wheezes. She has no rales.  Abdominal: Soft. Bowel sounds are normal. She exhibits no distension. There is no tenderness.  Musculoskeletal: Normal range of motion. She exhibits tenderness. She exhibits no edema.  Pain in the right shoulder/shoulder blade, neck, middle/lower  back-gait instability and frequent falling.   Lymphadenopathy:    She has no cervical adenopathy.  Neurological: She is alert. She has normal reflexes. No cranial nerve deficit. She exhibits normal muscle tone. Coordination normal.  Skin: Skin is warm and dry. No rash noted. She is not diaphoretic. No erythema.  Psychiatric: Her mood appears not anxious. Her affect is inappropriate. Her affect is not angry, not blunt and not labile. Her speech is delayed. Her speech is not rapid and/or pressured, not tangential and not slurred. She is slowed. She is not agitated, not aggressive, not hyperactive, not withdrawn and not actively hallucinating. Thought content is not paranoid and not delusional. Cognition and memory are impaired. She expresses impulsivity and inappropriate judgment. She does not exhibit a depressed mood. She is communicative. She exhibits abnormal recent memory. She exhibits normal remote memory. She is attentive.    Filed Vitals:  11/06/13 1153  BP: 118/62  Pulse: 66  Temp: 98.9 F (37.2 C)  TempSrc: Tympanic  Resp: 18      Labs reviewed: Basic Metabolic Panel:  Recent Labs  16/10/96 03/09/13 05/16/13 08/15/13 09/27/13 10/05/13  NA 135* 136* 136* 134* 136*  --   K 4.4 3.9 4.1 3.5 3.7  --   BUN 19 17 21  25* 18  --   CREATININE 0.7 0.6 0.7 0.7 0.7  --   TSH  --  1.12  --   --   --  1.12   Liver Function Tests:  Recent Labs  03/09/13  AST 12*  ALT 8  ALKPHOS 54   CBC:  Recent Labs  05/16/13 07/04/13 10/05/13  WBC 7.3 5.7 5.8  HGB 14.4 12.2 12.4  HCT 43 35* 37  PLT 162 151 152    Past Procedures: 04/13/13 X-ray C/T/L spine, right shoulder/scapula: disc disease L3-L5. normal with no significant abnormalities seen the right shoulder and the right scapula. Minimal disc disease cervical spine. Old upper thoracic vertebral body compression fractures/disc disease.    Assessment/Plan DEMENTIA Progressing and frequent falling, now in SNF for care. Continue  Aricept and Namenda. dc'd Oxybutynin didn't change her baseline confusion. It was not related to UTI-Urine culture 10/13/13 showed no growth.                     HYPERTENSION, BENIGN ESSENTIAL Controlled on Amlodipine 5mg  and Metoprolol 50mg  bid.                   OVERACTIVE BLADDER C/o frequent bathroom trips that interfere her night-better since on  Myrbetriq started-no change since therapeutic exchanged to Oxybutynin, but seems more confusion than prior-dc'd Oxybutynin-no change in her symptoms.            OSTEOARTHROSIS, GENERALIZED, MULTIPLE SITES No c/o  pain in her right shoulder and shoulder blade(no decreased ROM of the right shoulder) and lower back today.  Hx of middle of her spine vertebral body got out of alignment from falling on staircase in 1950--chronic on and off pain since the event. Tylenol 650 q6hr and Celebrex 200mg  daily for pain, Prilosec 20mg  daily for GI protection--pain is better controlled.                 Constipation C/o loose bowels--hx IBS, resolved since MiraLax prn and dded Colace 100mg  bid             Family/ Staff Communication: observe the patient.   Goals of Care: SNF  Labs/tests ordered: none. Urine culture 10/13/13 no growth.

## 2013-11-09 ENCOUNTER — Encounter: Payer: Self-pay | Admitting: Nurse Practitioner

## 2013-11-14 DIAGNOSIS — N390 Urinary tract infection, site not specified: Secondary | ICD-10-CM | POA: Diagnosis not present

## 2013-12-06 ENCOUNTER — Encounter: Payer: Self-pay | Admitting: Nurse Practitioner

## 2013-12-06 ENCOUNTER — Non-Acute Institutional Stay (SKILLED_NURSING_FACILITY): Payer: Medicare Other | Admitting: Nurse Practitioner

## 2013-12-06 DIAGNOSIS — F341 Dysthymic disorder: Secondary | ICD-10-CM | POA: Diagnosis not present

## 2013-12-06 DIAGNOSIS — K5900 Constipation, unspecified: Secondary | ICD-10-CM

## 2013-12-06 DIAGNOSIS — F068 Other specified mental disorders due to known physiological condition: Secondary | ICD-10-CM | POA: Diagnosis not present

## 2013-12-06 DIAGNOSIS — I10 Essential (primary) hypertension: Secondary | ICD-10-CM

## 2013-12-06 DIAGNOSIS — M546 Pain in thoracic spine: Secondary | ICD-10-CM

## 2013-12-06 DIAGNOSIS — K219 Gastro-esophageal reflux disease without esophagitis: Secondary | ICD-10-CM | POA: Diagnosis not present

## 2013-12-06 DIAGNOSIS — F418 Other specified anxiety disorders: Secondary | ICD-10-CM | POA: Insufficient documentation

## 2013-12-06 NOTE — Assessment & Plan Note (Signed)
Controlled on Amlodipine 5mg and Metoprolol 50mg bid.     

## 2013-12-06 NOTE — Assessment & Plan Note (Signed)
C/o loose bowels--hx IBS, resolved since MiraLax prn and dded Colace 100mg  bid

## 2013-12-06 NOTE — Progress Notes (Signed)
Patient ID: Jamie [name], female   DOB: 08/12/1920, 80 y.o.   MRN: [id number]    Patient ID: Jamie [name], female   DOB: 04/18/1920, 80 y.o.   MRN: [id number]  Code Status: DNR  Allergies  Allergen Reactions  . Alendronate Sodium     REACTION: GI    Chief Complaint  Patient presents with  . Medical Managment of Chronic Issues    HPI: Patient is a 80 y.o. female seen in [facility] SN[facility] today for evaluation of her chronic medical conditions.   Review of Systems:  Review of Systems  Constitutional: Negative for fever, chills, weight loss, malaise/fatigue and diaphoresis.  HENT: Positive for hearing loss. Negative for congestion, ear pain and sore throat.   Eyes: Negative for pain, discharge and redness.  Respiratory: Negative for cough, sputum production, shortness of breath and wheezing.   Cardiovascular: Negative for chest pain, orthopnea, claudication, leg swelling and PND.  Gastrointestinal: Negative for heartburn, nausea, vomiting, abdominal pain, diarrhea, constipation and blood in stool.  Genitourinary: Positive for frequency. Negative for dysuria, urgency, hematuria and flank pain.  Musculoskeletal: Positive for back pain, falls and joint pain. Negative for myalgias and neck pain.       Gait instability.   Skin: Negative for itching and rash.  Neurological: Positive for weakness (generalized. ). Negative for dizziness, tingling, tremors, sensory change, speech change, focal weakness, seizures, loss of consciousness and headaches.  Endo/Heme/Allergies: Negative for environmental allergies and polydipsia. Does not bruise/bleed easily.  Psychiatric/Behavioral: Positive for memory loss. Negative for depression and hallucinations. The patient is not nervous/anxious and does not have insomnia.      Past Medical History  Diagnosis Date  . Osteoporosis   . Allergic rhinitis   . Dementia   . Osteoarthritis     hands  . IBS (irritable bowel syndrome)   . GERD  (gastroesophageal reflux disease)   . Hypertension   . Palpitation     a. PAC's & PVC's by Holter - 2011  . Tortuous colon 2002  . Descending thoracic aortic dissection 04/24/2010    a. MRA 2011 - begins distal to left subclavian and extends throughout the full length of the Ao.  Marland Kitchen. Aortic insufficiency 04/16/2010    a. Echo 2011 - mild to moderate AI, EF 60%.  . Anemia, unspecified 01/12/2013  . Other abnormal blood chemistry 01/12/2013  . Lumbago 10/06/2012  . Personal history of fall 10/06/2012  . Hyposmolality and/or hyponatremia 09/29/2012  . Otitis externa 09/29/2012  . Impacted cerumen 09/29/2012  . Unspecified conductive hearing loss 09/29/2012  . External hemorrhoids without mention of complication 09/29/2012  . Unspecified venous (peripheral) insufficiency 09/29/2012  . Unspecified constipation 09/29/2012  . Abnormality of gait 09/29/2012  . Congestive heart failure, unspecified 06/30/2003   Past Surgical History  Procedure Laterality Date  . Dilation and curettage of uterus  1960  . Cataract extraction w/ intraocular lens implant     Social History:   reports that she has never smoked. She has never used smokeless tobacco. She reports that she does not drink alcohol or use illicit drugs.  Family History  Problem Relation Age of Onset  . Diabetes Mother   . Coronary artery disease Father   . Hypertension Father   . Diabetes      sisters    Medications: Patient's Medications  New Prescriptions   No medications on file  Previous Medications   ACETAMINOPHEN (TYLENOL) 325 MG TABLET    Take  325 mg by mouth. Take two tablets every 6 hours   AMLODIPINE (NORVASC) 5 MG TABLET    Take 1 tablet (5 mg total) by mouth daily.   ARTIFICIAL TEAR SOLUTION (TEARS NATURALE II OP)    Apply 1 drop to eye daily. Both eyes twice daily   AZELASTINE (ASTELIN) 137 MCG/SPRAY NASAL SPRAY    Place 2 sprays into the nose. At bedtime   CALCIUM CITRATE-VITAMIN D (CITRACAL+D) 315-200 MG-UNIT PER TABLET     Take 2 tablets by mouth. Take twice daily   CELECOXIB (CELEBREX) 200 MG CAPSULE    Take 200 mg by mouth daily.   CHOLECALCIFEROL (VITAMIN D) 2000 UNITS CAPS    Take by mouth daily.     DOCUSATE SODIUM (COLACE) 100 MG CAPSULE    Take 100 mg by mouth 2 (two) times daily.   DONEPEZIL (ARICEPT) 10 MG TABLET    Take 1 tablet (10 mg total) by mouth daily.   MEMANTINE (NAMENDA) 10 MG TABLET    Take 10 mg by mouth 2 (two) times daily.   METOPROLOL (LOPRESSOR) 50 MG TABLET    Take 1 tablet (50 mg total) by mouth 2 (two) times daily.   MIRTAZAPINE (REMERON) 7.5 MG TABLET    Take 7.5 mg by mouth at bedtime.   MULTIPLE VITAMINS-MINERALS (CENTRUM PO)    Take 1 tablet by mouth 1 day or 1 dose.   OMEPRAZOLE (PRILOSEC) 20 MG CAPSULE    Take 20 mg by mouth daily.   POLYETHYLENE GLYCOL (MIRALAX / GLYCOLAX) PACKET    Take 17 g by mouth. Fill cap to 17 gm mark, mix with 4 oz of fluid and take by mouth every other day.  Modified Medications   No medications on file  Discontinued Medications   No medications on file     Physical Exam: Physical Exam  Constitutional: She appears well-developed and well-nourished. No distress.  HENT:  Head: Normocephalic and atraumatic.  Nose: Nose normal.  Mouth/Throat: Oropharynx is clear and moist. No oropharyngeal exudate.  Eyes: Conjunctivae and EOM are normal. Pupils are equal, round, and reactive to light. Right eye exhibits no discharge. Left eye exhibits no discharge. No scleral icterus.  Neck: Normal range of motion. Neck supple. No JVD present. No thyromegaly present.  Cardiovascular: Normal rate, regular rhythm and normal heart sounds.   No murmur heard. Pulmonary/Chest: Effort normal and breath sounds normal. No respiratory distress. She has no wheezes. She has no rales.  Abdominal: Soft. Bowel sounds are normal. She exhibits no distension. There is no tenderness.  Musculoskeletal: Normal range of motion. She exhibits tenderness. She exhibits no edema.  Pain in  the right shoulder/shoulder blade, neck, middle/lower back-gait instability and frequent falling.   Lymphadenopathy:    She has no cervical adenopathy.  Neurological: She is alert. She has normal reflexes. No cranial nerve deficit. She exhibits normal muscle tone. Coordination normal.  Skin: Skin is warm and dry. No rash noted. She is not diaphoretic. No erythema.  Psychiatric: Her mood appears not anxious. Her affect is inappropriate. Her affect is not angry, not blunt and not labile. Her speech is delayed. Her speech is not rapid and/or pressured, not tangential and not slurred. She is slowed. She is not agitated, not aggressive, not hyperactive, not withdrawn and not actively hallucinating. Thought content is not paranoid and not delusional. Cognition and memory are impaired. She expresses impulsivity and inappropriate judgment. She does not exhibit a depressed mood. She is communicative. She exhibits abnormal recent  memory. She exhibits normal remote memory. She is attentive.    Filed Vitals:   12/06/13 1108  BP: 130/78  Pulse: 72  Temp: 97.6 F (36.4 C)  TempSrc: Tympanic  Resp: 18      Labs reviewed: Basic Metabolic Panel:  Recent Labs  16/10/96 03/09/13 05/16/13 08/15/13 09/27/13 10/05/13  NA 135* 136* 136* 134* 136*  --   K 4.4 3.9 4.1 3.5 3.7  --   BUN 19 17 21  25* 18  --   CREATININE 0.7 0.6 0.7 0.7 0.7  --   TSH  --  1.12  --   --   --  1.12   Liver Function Tests:  Recent Labs  03/09/13  AST 12*  ALT 8  ALKPHOS 54   CBC:  Recent Labs  05/16/13 07/04/13 10/05/13  WBC 7.3 5.7 5.8  HGB 14.4 12.2 12.4  HCT 43 35* 37  PLT 162 151 152    Past Procedures: 04/13/13 X-ray C/T/L spine, right shoulder/scapula: disc disease L3-L5. normal with no significant abnormalities seen the right shoulder and the right scapula. Minimal disc disease cervical spine. Old upper thoracic vertebral body compression fractures/disc disease.    Assessment/Plan No problem-specific  assessment & plan notes found for this encounter.   Family/ Staff Communication: observe the patient.   Goals of Care: SNF  Labs/tests ordered: none. Urine culture 10/13/13 no growth.        This encounter was created in error - please disregard.

## 2013-12-06 NOTE — Assessment & Plan Note (Signed)
No c/o  pain in her right shoulder and shoulder blade(no decreased ROM of the right shoulder) and lower back today.  Hx of middle of her spine vertebral body got out of alignment from falling on staircase in 1950--chronic on and off pain since the event. Better with Tylenol 650mg  qid and Celebrex 200mg  daily for pain, Prilosec 20mg  daily for GI protection--pain is better controlled.

## 2013-12-06 NOTE — Assessment & Plan Note (Signed)
Stable, takes Mirtazapine 7.5mg  nightly, weights stable # 126 since May 2014

## 2013-12-06 NOTE — Assessment & Plan Note (Signed)
Progressing and frequent falling, now in SNF for care. Continue Aricept and Namenda.  

## 2013-12-06 NOTE — Progress Notes (Signed)
Patient ID: TEMIKA SUTPHIN, female   DOB: 03/29/1920, 79 y.o.   MRN: [id number]   Code Status: DNR  Allergies  Allergen Reactions  . Alendronate Sodium     REACTION: GI    Chief Complaint  Patient presents with  . Medical Managment of Chronic Issues    HPI: Patient is a 80 y.o. female seen in the SNF at [facility] today for evaluation of  chronic medical conditions.  Problem List Items Addressed This Visit   DEMENTIA - Primary     Progressing and frequent falling, now in SNF for care. Continue Aricept and Namenda.                         Depression with anxiety     Stable, takes Mirtazapine 7.5mg  nightly, weights stable # 126 since May 2014    GERD (gastroesophageal reflux disease)     Stable on Prilosec 20mg  daily.             Hypertension     Controlled on Amlodipine 5mg  and Metoprolol 50mg  bid.                       Thoracic back pain     No c/o  pain in her right shoulder and shoulder blade(no decreased ROM of the right shoulder) and lower back today.  Hx of middle of her spine vertebral body got out of alignment from falling on staircase in 1950--chronic on and off pain since the event. Better with Tylenol 650mg  qid and Celebrex 200mg  daily for pain, Prilosec 20mg  daily for GI protection--pain is better controlled.                   Unspecified constipation     C/o loose bowels--hx IBS, resolved since MiraLax prn and dded Colace 100mg  bid                  Review of Systems:  Review of Systems  Constitutional: Negative for fever, chills, weight loss, malaise/fatigue and diaphoresis.  HENT: Positive for hearing loss. Negative for congestion, ear pain and sore throat.   Eyes: Negative for pain, discharge and redness.  Respiratory: Negative for cough, sputum production, shortness of breath and wheezing.   Cardiovascular: Negative for chest pain, orthopnea, claudication, leg swelling and PND.    Gastrointestinal: Negative for heartburn, nausea, vomiting, abdominal pain, diarrhea, constipation and blood in stool.  Genitourinary: Positive for frequency. Negative for dysuria, urgency, hematuria and flank pain.  Musculoskeletal: Positive for back pain, falls and joint pain. Negative for myalgias and neck pain.       Gait instability.   Skin: Negative for itching and rash.  Neurological: Positive for weakness (generalized. ). Negative for dizziness, tingling, tremors, sensory change, speech change, focal weakness, seizures, loss of consciousness and headaches.  Endo/Heme/Allergies: Negative for environmental allergies and polydipsia. Does not bruise/bleed easily.  Psychiatric/Behavioral: Positive for memory loss. Negative for depression and hallucinations. The patient is not nervous/anxious and does not have insomnia.      Past Medical History  Diagnosis Date  . Osteoporosis   . Allergic rhinitis   . Dementia   . Osteoarthritis     hands  . IBS (irritable bowel syndrome)   . GERD (gastroesophageal reflux disease)   . Hypertension   . Palpitation     a. PAC's & PVC's by Holter - 2011  . Tortuous colon 2002  . Descending thoracic  aortic dissection 04/24/2010    a. MRA 2011 - begins distal to left subclavian and extends throughout the full length of the Ao.  Marland Kitchen Aortic insufficiency 04/16/2010    a. Echo 2011 - mild to moderate AI, EF 60%.  . Anemia, unspecified 01/12/2013  . Other abnormal blood chemistry 01/12/2013  . Lumbago 10/06/2012  . Personal history of fall 10/06/2012  . Hyposmolality and/or hyponatremia 09/29/2012  . Otitis externa 09/29/2012  . Impacted cerumen 09/29/2012  . Unspecified conductive hearing loss 09/29/2012  . External hemorrhoids without mention of complication 09/29/2012  . Unspecified venous (peripheral) insufficiency 09/29/2012  . Unspecified constipation 09/29/2012  . Abnormality of gait 09/29/2012  . Congestive heart failure, unspecified 06/30/2003   Past  Surgical History  Procedure Laterality Date  . Dilation and curettage of uterus  1960  . Cataract extraction w/ intraocular lens implant     Social History:   reports that she has never smoked. She has never used smokeless tobacco. She reports that she does not drink alcohol or use illicit drugs.  Family History  Problem Relation Age of Onset  . Diabetes Mother   . Coronary artery disease Father   . Hypertension Father   . Diabetes      sisters    Medications: Patient's Medications  New Prescriptions   No medications on file  Previous Medications   ACETAMINOPHEN (TYLENOL) 325 MG TABLET    Take 325 mg by mouth. Take two tablets every 6 hours   AMLODIPINE (NORVASC) 5 MG TABLET    Take 1 tablet (5 mg total) by mouth daily.   ARTIFICIAL TEAR SOLUTION (TEARS NATURALE II OP)    Apply 1 drop to eye daily. Both eyes twice daily   AZELASTINE (ASTELIN) 137 MCG/SPRAY NASAL SPRAY    Place 2 sprays into the nose. At bedtime   CALCIUM CITRATE-VITAMIN D (CITRACAL+D) 315-200 MG-UNIT PER TABLET    Take 2 tablets by mouth. Take twice daily   CELECOXIB (CELEBREX) 200 MG CAPSULE    Take 200 mg by mouth daily.   CHOLECALCIFEROL (VITAMIN D) 2000 UNITS CAPS    Take by mouth daily.     DOCUSATE SODIUM (COLACE) 100 MG CAPSULE    Take 100 mg by mouth 2 (two) times daily.   DONEPEZIL (ARICEPT) 10 MG TABLET    Take 1 tablet (10 mg total) by mouth daily.   MEMANTINE (NAMENDA) 10 MG TABLET    Take 28 mg by mouth daily.    METOPROLOL (LOPRESSOR) 50 MG TABLET    Take 1 tablet (50 mg total) by mouth 2 (two) times daily.   MIRTAZAPINE (REMERON) 7.5 MG TABLET    Take 7.5 mg by mouth at bedtime.   MULTIPLE VITAMINS-MINERALS (CENTRUM PO)    Take 1 tablet by mouth 1 day or 1 dose.   OMEPRAZOLE (PRILOSEC) 20 MG CAPSULE    Take 20 mg by mouth daily.   POLYETHYLENE GLYCOL (MIRALAX / GLYCOLAX) PACKET    Take 17 g by mouth. Fill cap to 17 gm mark, mix with 4 oz of fluid and take by mouth every other day.  Modified  Medications   No medications on file  Discontinued Medications   No medications on file     Physical Exam: Physical Exam  Constitutional: She appears well-developed and well-nourished. No distress.  HENT:  Head: Normocephalic and atraumatic.  Nose: Nose normal.  Mouth/Throat: Oropharynx is clear and moist. No oropharyngeal exudate.  Eyes: Conjunctivae and EOM are normal. Pupils are  equal, round, and reactive to light. Right eye exhibits no discharge. Left eye exhibits no discharge. No scleral icterus.  Neck: Normal range of motion. Neck supple. No JVD present. No thyromegaly present.  Cardiovascular: Normal rate, regular rhythm and normal heart sounds.   No murmur heard. Pulmonary/Chest: Effort normal and breath sounds normal. No respiratory distress. She has no wheezes. She has no rales.  Abdominal: Soft. Bowel sounds are normal. She exhibits no distension. There is no tenderness.  Musculoskeletal: Normal range of motion. She exhibits tenderness. She exhibits no edema.  Pain in the right shoulder/shoulder blade, neck, middle/lower back-gait instability and frequent falling.   Lymphadenopathy:    She has no cervical adenopathy.  Neurological: She is alert. She has normal reflexes. No cranial nerve deficit. She exhibits normal muscle tone. Coordination normal.  Skin: Skin is warm and dry. No rash noted. She is not diaphoretic. No erythema.  Psychiatric: Her mood appears not anxious. Her affect is inappropriate. Her affect is not angry, not blunt and not labile. Her speech is delayed. Her speech is not rapid and/or pressured, not tangential and not slurred. She is slowed. She is not agitated, not aggressive, not hyperactive, not withdrawn and not actively hallucinating. Thought content is not paranoid and not delusional. Cognition and memory are impaired. She expresses impulsivity and inappropriate judgment. She does not exhibit a depressed mood. She is communicative. She exhibits abnormal  recent memory. She exhibits normal remote memory. She is attentive.    Filed Vitals:   12/06/13 1112  BP: 130/78  Pulse: 72  Temp: 97.6 F (36.4 C)  TempSrc: Tympanic  Resp: 18      Labs reviewed: Basic Metabolic Panel:  Recent Labs  16/10/96 03/09/13 05/16/13 08/15/13 09/27/13 10/05/13  NA 135* 136* 136* 134* 136*  --   K 4.4 3.9 4.1 3.5 3.7  --   BUN 19 17 21  25* 18  --   CREATININE 0.7 0.6 0.7 0.7 0.7  --   TSH  --  1.12  --   --   --  1.12   Liver Function Tests:  Recent Labs  03/09/13  AST 12*  ALT 8  ALKPHOS 54    CBC:     Component Value Date/Time   WBC 5.8 10/05/2013   WBC 6.0 05/28/2012 1205   RBC 4.38 05/28/2012 1205   HGB 12.4 10/05/2013   HCT 37 10/05/2013   PLT 152 10/05/2013   MCV 90.9 05/28/2012 1205   MCH 29.9 05/28/2012 1205   MCHC 32.9 05/28/2012 1205   RDW 12.9 05/28/2012 1205   LYMPHSABS 1.4 05/28/2012 1205   MONOABS 0.6 05/28/2012 1205   EOSABS 0.1 05/28/2012 1205   BASOSABS 0.0 05/28/2012 1205      Assessment/Plan DEMENTIA Progressing and frequent falling, now in SNF for care. Continue Aricept and Namenda.                       GERD (gastroesophageal reflux disease) Stable on Prilosec 20mg  daily.           Hypertension Controlled on Amlodipine 5mg  and Metoprolol 50mg  bid.                     Depression with anxiety Stable, takes Mirtazapine 7.5mg  nightly, weights stable # 126 since May 2014  Unspecified constipation C/o loose bowels--hx IBS, resolved since MiraLax prn and dded Colace 100mg  bid             Thoracic back  pain No c/o  pain in her right shoulder and shoulder blade(no decreased ROM of the right shoulder) and lower back today.  Hx of middle of her spine vertebral body got out of alignment from falling on staircase in 1950--chronic on and off pain since the event. Better with Tylenol 650mg  qid and Celebrex 200mg  daily for pain, Prilosec 20mg  daily for GI protection--pain is  better controlled.                   Family/ Staff Communication: observe the patient  Goals of Care: SNF  Labs/tests ordered: none

## 2013-12-06 NOTE — Assessment & Plan Note (Signed)
Stable on Prilosec 20 mg daily. 

## 2014-01-10 ENCOUNTER — Encounter: Payer: Self-pay | Admitting: Nurse Practitioner

## 2014-01-10 ENCOUNTER — Non-Acute Institutional Stay (SKILLED_NURSING_FACILITY): Payer: Medicare Other | Admitting: Nurse Practitioner

## 2014-01-10 DIAGNOSIS — F418 Other specified anxiety disorders: Secondary | ICD-10-CM

## 2014-01-10 DIAGNOSIS — K219 Gastro-esophageal reflux disease without esophagitis: Secondary | ICD-10-CM

## 2014-01-10 DIAGNOSIS — F068 Other specified mental disorders due to known physiological condition: Secondary | ICD-10-CM | POA: Diagnosis not present

## 2014-01-10 DIAGNOSIS — K5900 Constipation, unspecified: Secondary | ICD-10-CM

## 2014-01-10 DIAGNOSIS — M159 Polyosteoarthritis, unspecified: Secondary | ICD-10-CM

## 2014-01-10 DIAGNOSIS — I10 Essential (primary) hypertension: Secondary | ICD-10-CM | POA: Diagnosis not present

## 2014-01-10 DIAGNOSIS — F341 Dysthymic disorder: Secondary | ICD-10-CM

## 2014-01-10 NOTE — Assessment & Plan Note (Signed)
Controlled on Amlodipine 5mg and Metoprolol 50mg bid.     

## 2014-01-10 NOTE — Assessment & Plan Note (Signed)
Stable on Prilosec 20 mg daily. 

## 2014-01-10 NOTE — Assessment & Plan Note (Signed)
C/o loose bowels--hx IBS, resolved since MiraLax prn and dded Colace 100mg  bid

## 2014-01-10 NOTE — Assessment & Plan Note (Signed)
Stable, takes Mirtazapine 7.5mg  nightly, weights stable # 126 since May 2014

## 2014-01-10 NOTE — Progress Notes (Signed)
Patient ID: Jamie [name], female   DOB: 02/22/1920, 80 y.o.   MRN: [id number]   Code Status: DNR  Allergies  Allergen Reactions  . Alendronate Sodium     REACTION: GI    Chief Complaint  Patient presents with  . Medical Managment of Chronic Issues    HPI: Patient is a 80 y.o. female seen in the SNF at [facility] today for evaluation of  chronic medical conditions.  Problem List Items Addressed This Visit   Unspecified constipation     C/o loose bowels--hx IBS, resolved since MiraLax prn and dded Colace 100mg  bid      OSTEOARTHROSIS, GENERALIZED, MULTIPLE SITES     o c/o  pain in her right shoulder and shoulder blade(no decreased ROM of the right shoulder) and lower back today.  Hx of middle of her spine vertebral body got out of alignment from falling on staircase in 1950--chronic on and off pain since the event. Tylenol 650 q6hr and Celebrex 200mg  daily for pain, Prilosec 20mg  daily for GI protection--pain is better controlled.    Hypertension     Controlled on Amlodipine 5mg  and Metoprolol 50mg  bid.     GERD (gastroesophageal reflux disease) - Primary     Stable on Prilosec 20mg  daily.      Depression with anxiety     Stable, takes Mirtazapine 7.5mg  nightly, weights stable # 126 since May 2014     DEMENTIA     Progressing and frequent falling, now in SNF for care. Continue Aricept and Namenda.       Review of Systems:  Review of Systems  Constitutional: Negative for fever, chills, weight loss, malaise/fatigue and diaphoresis.  HENT: Positive for hearing loss. Negative for congestion, ear pain and sore throat.   Eyes: Negative for pain, discharge and redness.  Respiratory: Negative for cough, sputum production, shortness of breath and wheezing.   Cardiovascular: Negative for chest pain, orthopnea, claudication, leg swelling and PND.  Gastrointestinal: Negative for heartburn, nausea, vomiting, abdominal pain, diarrhea, constipation and blood in stool.    Genitourinary: Positive for frequency. Negative for dysuria, urgency, hematuria and flank pain.  Musculoskeletal: Positive for back pain, falls and joint pain. Negative for myalgias and neck pain.       Gait instability.   Skin: Negative for itching and rash.  Neurological: Positive for weakness (generalized. ). Negative for dizziness, tingling, tremors, sensory change, speech change, focal weakness, seizures, loss of consciousness and headaches.  Endo/Heme/Allergies: Negative for environmental allergies and polydipsia. Does not bruise/bleed easily.  Psychiatric/Behavioral: Positive for memory loss. Negative for depression and hallucinations. The patient is not nervous/anxious and does not have insomnia.      Past Medical History  Diagnosis Date  . Osteoporosis   . Allergic rhinitis   . Dementia   . Osteoarthritis     hands  . IBS (irritable bowel syndrome)   . GERD (gastroesophageal reflux disease)   . Hypertension   . Palpitation     a. PAC's & PVC's by Holter - 2011  . Tortuous colon 2002  . Descending thoracic aortic dissection 04/24/2010    a. MRA 2011 - begins distal to left subclavian and extends throughout the full length of the Ao.  Marland Kitchen Aortic insufficiency 04/16/2010    a. Echo 2011 - mild to moderate AI, EF 60%.  . Anemia, unspecified 01/12/2013  . Other abnormal blood chemistry 01/12/2013  . Lumbago 10/06/2012  . Personal history of fall 10/06/2012  . Hyposmolality and/or hyponatremia  09/29/2012  . Otitis externa 09/29/2012  . Impacted cerumen 09/29/2012  . Unspecified conductive hearing loss 09/29/2012  . External hemorrhoids without mention of complication 09/29/2012  . Unspecified venous (peripheral) insufficiency 09/29/2012  . Unspecified constipation 09/29/2012  . Abnormality of gait 09/29/2012  . Congestive heart failure, unspecified 06/30/2003   Past Surgical History  Procedure Laterality Date  . Dilation and curettage of uterus  1960  . Cataract extraction w/  intraocular lens implant     Social History:   reports that she has never smoked. She has never used smokeless tobacco. She reports that she does not drink alcohol or use illicit drugs.  Family History  Problem Relation Age of Onset  . Diabetes Mother   . Coronary artery disease Father   . Hypertension Father   . Diabetes      sisters    Medications: Patient's Medications  New Prescriptions   No medications on file  Previous Medications   ACETAMINOPHEN (TYLENOL) 325 MG TABLET    Take 325 mg by mouth. Take two tablets every 6 hours   AMLODIPINE (NORVASC) 5 MG TABLET    Take 1 tablet (5 mg total) by mouth daily.   ARTIFICIAL TEAR SOLUTION (TEARS NATURALE II OP)    Apply 1 drop to eye daily. Both eyes twice daily   AZELASTINE (ASTELIN) 137 MCG/SPRAY NASAL SPRAY    Place 2 sprays into the nose. At bedtime   CALCIUM CITRATE-VITAMIN D (CITRACAL+D) 315-200 MG-UNIT PER TABLET    Take 2 tablets by mouth. Take twice daily   CELECOXIB (CELEBREX) 200 MG CAPSULE    Take 200 mg by mouth daily.   CHOLECALCIFEROL (VITAMIN D) 2000 UNITS CAPS    Take by mouth daily.     DOCUSATE SODIUM (COLACE) 100 MG CAPSULE    Take 100 mg by mouth 2 (two) times daily.   DONEPEZIL (ARICEPT) 10 MG TABLET    Take 1 tablet (10 mg total) by mouth daily.   MEMANTINE (NAMENDA) 10 MG TABLET    Take 28 mg by mouth daily.    METOPROLOL (LOPRESSOR) 50 MG TABLET    Take 1 tablet (50 mg total) by mouth 2 (two) times daily.   MIRTAZAPINE (REMERON) 7.5 MG TABLET    Take 7.5 mg by mouth at bedtime.   MULTIPLE VITAMINS-MINERALS (CENTRUM PO)    Take 1 tablet by mouth 1 day or 1 dose.   OMEPRAZOLE (PRILOSEC) 20 MG CAPSULE    Take 20 mg by mouth daily.   POLYETHYLENE GLYCOL (MIRALAX / GLYCOLAX) PACKET    Take 17 g by mouth. Fill cap to 17 gm mark, mix with 4 oz of fluid and take by mouth every other day.  Modified Medications   No medications on file  Discontinued Medications   No medications on file     Physical  Exam: Physical Exam  Constitutional: She appears well-developed and well-nourished. No distress.  HENT:  Head: Normocephalic and atraumatic.  Nose: Nose normal.  Mouth/Throat: Oropharynx is clear and moist. No oropharyngeal exudate.  Eyes: Conjunctivae and EOM are normal. Pupils are equal, round, and reactive to light. Right eye exhibits no discharge. Left eye exhibits no discharge. No scleral icterus.  Neck: Normal range of motion. Neck supple. No JVD present. No thyromegaly present.  Cardiovascular: Normal rate, regular rhythm and normal heart sounds.   No murmur heard. Pulmonary/Chest: Effort normal and breath sounds normal. No respiratory distress. She has no wheezes. She has no rales.  Abdominal: Soft. Bowel  sounds are normal. She exhibits no distension. There is no tenderness.  Musculoskeletal: Normal range of motion. She exhibits tenderness. She exhibits no edema.  Pain in the right shoulder/shoulder blade, neck, middle/lower back-gait instability and frequent falling.   Lymphadenopathy:    She has no cervical adenopathy.  Neurological: She is alert. She has normal reflexes. No cranial nerve deficit. She exhibits normal muscle tone. Coordination normal.  Skin: Skin is warm and dry. No rash noted. She is not diaphoretic. No erythema.  Psychiatric: Her mood appears not anxious. Her affect is inappropriate. Her affect is not angry, not blunt and not labile. Her speech is delayed. Her speech is not rapid and/or pressured, not tangential and not slurred. She is slowed. She is not agitated, not aggressive, not hyperactive, not withdrawn and not actively hallucinating. Thought content is not paranoid and not delusional. Cognition and memory are impaired. She expresses impulsivity and inappropriate judgment. She does not exhibit a depressed mood. She is communicative. She exhibits abnormal recent memory. She exhibits normal remote memory. She is attentive.    Filed Vitals:   01/10/14 1501  BP:  116/68  Pulse: 66  Temp: 98.2 F (36.8 C)  TempSrc: Tympanic  Resp: 16      Labs reviewed: Basic Metabolic Panel:  Recent Labs  16/10/96 03/09/13 05/16/13 08/15/13 09/27/13 10/05/13  NA 135* 136* 136* 134* 136*  --   K 4.4 3.9 4.1 3.5 3.7  --   BUN 19 17 21  25* 18  --   CREATININE 0.7 0.6 0.7 0.7 0.7  --   TSH  --  1.12  --   --   --  1.12   Liver Function Tests:  Recent Labs  03/09/13  AST 12*  ALT 8  ALKPHOS 54    CBC:     Component Value Date/Time   WBC 5.8 10/05/2013   WBC 6.0 05/28/2012 1205   RBC 4.38 05/28/2012 1205   HGB 12.4 10/05/2013   HCT 37 10/05/2013   PLT 152 10/05/2013   MCV 90.9 05/28/2012 1205   MCH 29.9 05/28/2012 1205   MCHC 32.9 05/28/2012 1205   RDW 12.9 05/28/2012 1205   LYMPHSABS 1.4 05/28/2012 1205   MONOABS 0.6 05/28/2012 1205   EOSABS 0.1 05/28/2012 1205   BASOSABS 0.0 05/28/2012 1205      Assessment/Plan GERD (gastroesophageal reflux disease) Stable on Prilosec 20mg  daily.    Hypertension Controlled on Amlodipine 5mg  and Metoprolol 50mg  bid.   DEMENTIA Progressing and frequent falling, now in SNF for care. Continue Aricept and Namenda.  OSTEOARTHROSIS, GENERALIZED, MULTIPLE SITES o c/o  pain in her right shoulder and shoulder blade(no decreased ROM of the right shoulder) and lower back today.  Hx of middle of her spine vertebral body got out of alignment from falling on staircase in 1950--chronic on and off pain since the event. Tylenol 650 q6hr and Celebrex 200mg  daily for pain, Prilosec 20mg  daily for GI protection--pain is better controlled.  Depression with anxiety Stable, takes Mirtazapine 7.5mg  nightly, weights stable # 126 since May 2014   Unspecified constipation C/o loose bowels--hx IBS, resolved since MiraLax prn and dded Colace 100mg  bid      Family/ Staff Communication: observe the patient  Goals of Care: SNF  Labs/tests ordered: none

## 2014-01-10 NOTE — Assessment & Plan Note (Signed)
Progressing and frequent falling, now in SNF for care. Continue Aricept and Namenda.  

## 2014-01-10 NOTE — Assessment & Plan Note (Signed)
o c/o  pain in her right shoulder and shoulder blade(no decreased ROM of the right shoulder) and lower back today.  Hx of middle of her spine vertebral body got out of alignment from falling on staircase in 1950--chronic on and off pain since the event. Tylenol 650 q6hr and Celebrex 200mg  daily for pain, Prilosec 20mg  daily for GI protection--pain is better controlled.

## 2014-02-07 ENCOUNTER — Encounter: Payer: Self-pay | Admitting: Nurse Practitioner

## 2014-02-07 ENCOUNTER — Non-Acute Institutional Stay (SKILLED_NURSING_FACILITY): Payer: Medicare Other | Admitting: Nurse Practitioner

## 2014-02-07 DIAGNOSIS — K5900 Constipation, unspecified: Secondary | ICD-10-CM

## 2014-02-07 DIAGNOSIS — F068 Other specified mental disorders due to known physiological condition: Secondary | ICD-10-CM

## 2014-02-07 DIAGNOSIS — M159 Polyosteoarthritis, unspecified: Secondary | ICD-10-CM

## 2014-02-07 DIAGNOSIS — R269 Unspecified abnormalities of gait and mobility: Secondary | ICD-10-CM

## 2014-02-07 DIAGNOSIS — I10 Essential (primary) hypertension: Secondary | ICD-10-CM

## 2014-02-07 DIAGNOSIS — F341 Dysthymic disorder: Secondary | ICD-10-CM

## 2014-02-07 DIAGNOSIS — F418 Other specified anxiety disorders: Secondary | ICD-10-CM

## 2014-02-07 NOTE — Assessment & Plan Note (Signed)
Progressing and frequent falling, now in SNF for care. Continue Aricept and Namenda.  

## 2014-02-07 NOTE — Assessment & Plan Note (Signed)
Controlled on Amlodipine 5mg and Metoprolol 50mg bid.     

## 2014-02-07 NOTE — Assessment & Plan Note (Signed)
Stable, takes Mirtazapine 7.5mg  nightly, weights stable # 126 since May 2014

## 2014-02-07 NOTE — Progress Notes (Signed)
Patient ID: Jamie [name], female   DOB: 1920-02-11, 80 y.o.   MRN: [id number]   Code Status: DNR  Allergies  Allergen Reactions  . Alendronate Sodium     REACTION: GI    Chief Complaint  Patient presents with  . Medical Managment of Chronic Issues    HPI: Patient is a 80 y.o. female seen in the SNF at [facility] today for evaluation of  chronic medical conditions.  Problem List Items Addressed This Visit   DEMENTIA     Progressing and frequent falling, now in SNF for care. Continue Aricept and Namenda.     OSTEOARTHROSIS, GENERALIZED, MULTIPLE SITES     c/o  pain in her right shoulder and shoulder blade(no decreased ROM of the right shoulder) and lower back today.  Hx of middle of her spine vertebral body got out of alignment from falling on staircase in 1950--chronic on and off pain since the event. Tylenol 650 q6hr and Celebrex 200mg  daily for pain, Prilosec 20mg  daily for GI protection--pain is better controlled.     Hypertension - Primary     Controlled on Amlodipine 5mg  and Metoprolol 50mg  bid.      Unspecified constipation     C/o loose bowels--hx IBS, resolved since MiraLax prn and dded Colace 100mg  bid       Gait disorder     Worse-POA declined w/c. Ambulates with walker with SBA     Depression with anxiety     Stable, takes Mirtazapine 7.5mg  nightly, weights stable # 126 since May 2014         Review of Systems:  Review of Systems  Constitutional: Negative for fever, chills, weight loss, malaise/fatigue and diaphoresis.  HENT: Positive for hearing loss. Negative for congestion, ear pain and sore throat.   Eyes: Negative for pain, discharge and redness.  Respiratory: Negative for cough, sputum production, shortness of breath and wheezing.   Cardiovascular: Negative for chest pain, orthopnea, claudication, leg swelling and PND.  Gastrointestinal: Negative for heartburn, nausea, vomiting, abdominal pain, diarrhea, constipation and blood in stool.    Genitourinary: Positive for frequency. Negative for dysuria, urgency, hematuria and flank pain.  Musculoskeletal: Positive for back pain, falls and joint pain. Negative for myalgias and neck pain.       Gait instability.   Skin: Negative for itching and rash.  Neurological: Positive for weakness (generalized. ). Negative for dizziness, tingling, tremors, sensory change, speech change, focal weakness, seizures, loss of consciousness and headaches.  Endo/Heme/Allergies: Negative for environmental allergies and polydipsia. Does not bruise/bleed easily.  Psychiatric/Behavioral: Positive for memory loss. Negative for depression and hallucinations. The patient is not nervous/anxious and does not have insomnia.      Past Medical History  Diagnosis Date  . Osteoporosis   . Allergic rhinitis   . Dementia   . Osteoarthritis     hands  . IBS (irritable bowel syndrome)   . GERD (gastroesophageal reflux disease)   . Hypertension   . Palpitation     a. PAC's & PVC's by Holter - 2011  . Tortuous colon 2002  . Descending thoracic aortic dissection 04/24/2010    a. MRA 2011 - begins distal to left subclavian and extends throughout the full length of the Ao.  Marland Kitchen Aortic insufficiency 04/16/2010    a. Echo 2011 - mild to moderate AI, EF 60%.  . Anemia, unspecified 01/12/2013  . Other abnormal blood chemistry 01/12/2013  . Lumbago 10/06/2012  . Personal history of fall 10/06/2012  .  Hyposmolality and/or hyponatremia 09/29/2012  . Otitis externa 09/29/2012  . Impacted cerumen 09/29/2012  . Unspecified conductive hearing loss 09/29/2012  . External hemorrhoids without mention of complication 09/29/2012  . Unspecified venous (peripheral) insufficiency 09/29/2012  . Unspecified constipation 09/29/2012  . Abnormality of gait 09/29/2012  . Congestive heart failure, unspecified 06/30/2003   Past Surgical History  Procedure Laterality Date  . Dilation and curettage of uterus  1960  . Cataract extraction w/  intraocular lens implant     Social History:   reports that she has never smoked. She has never used smokeless tobacco. She reports that she does not drink alcohol or use illicit drugs.  Family History  Problem Relation Age of Onset  . Diabetes Mother   . Coronary artery disease Father   . Hypertension Father   . Diabetes      sisters    Medications: Patient's Medications  New Prescriptions   No medications on file  Previous Medications   ACETAMINOPHEN (TYLENOL) 325 MG TABLET    Take 325 mg by mouth. Take two tablets every 6 hours   AMLODIPINE (NORVASC) 5 MG TABLET    Take 1 tablet (5 mg total) by mouth daily.   ARTIFICIAL TEAR SOLUTION (TEARS NATURALE II OP)    Apply 1 drop to eye daily. Both eyes twice daily   AZELASTINE (ASTELIN) 137 MCG/SPRAY NASAL SPRAY    Place 2 sprays into the nose. At bedtime   CALCIUM CITRATE-VITAMIN D (CITRACAL+D) 315-200 MG-UNIT PER TABLET    Take 2 tablets by mouth. Take twice daily   CELECOXIB (CELEBREX) 200 MG CAPSULE    Take 200 mg by mouth daily.   CHOLECALCIFEROL (VITAMIN D) 2000 UNITS CAPS    Take by mouth daily.     DOCUSATE SODIUM (COLACE) 100 MG CAPSULE    Take 100 mg by mouth 2 (two) times daily.   DONEPEZIL (ARICEPT) 10 MG TABLET    Take 1 tablet (10 mg total) by mouth daily.   MEMANTINE (NAMENDA) 10 MG TABLET    Take 28 mg by mouth daily.    METOPROLOL (LOPRESSOR) 50 MG TABLET    Take 1 tablet (50 mg total) by mouth 2 (two) times daily.   MIRTAZAPINE (REMERON) 7.5 MG TABLET    Take 7.5 mg by mouth at bedtime.   MULTIPLE VITAMINS-MINERALS (CENTRUM PO)    Take 1 tablet by mouth 1 day or 1 dose.   OMEPRAZOLE (PRILOSEC) 20 MG CAPSULE    Take 20 mg by mouth daily.   POLYETHYLENE GLYCOL (MIRALAX / GLYCOLAX) PACKET    Take 17 g by mouth. Fill cap to 17 gm mark, mix with 4 oz of fluid and take by mouth every other day.  Modified Medications   No medications on file  Discontinued Medications   No medications on file     Physical  Exam: Physical Exam  Constitutional: She appears well-developed and well-nourished. No distress.  HENT:  Head: Normocephalic and atraumatic.  Nose: Nose normal.  Mouth/Throat: Oropharynx is clear and moist. No oropharyngeal exudate.  Eyes: Conjunctivae and EOM are normal. Pupils are equal, round, and reactive to light. Right eye exhibits no discharge. Left eye exhibits no discharge. No scleral icterus.  Neck: Normal range of motion. Neck supple. No JVD present. No thyromegaly present.  Cardiovascular: Normal rate, regular rhythm and normal heart sounds.   No murmur heard. Pulmonary/Chest: Effort normal and breath sounds normal. No respiratory distress. She has no wheezes. She has no rales.  Abdominal: Soft. Bowel sounds are normal. She exhibits no distension. There is no tenderness.  Musculoskeletal: Normal range of motion. She exhibits tenderness. She exhibits no edema.  Pain in the right shoulder/shoulder blade, neck, middle/lower back-gait instability and frequent falling.   Lymphadenopathy:    She has no cervical adenopathy.  Neurological: She is alert. She has normal reflexes. No cranial nerve deficit. She exhibits normal muscle tone. Coordination normal.  Skin: Skin is warm and dry. No rash noted. She is not diaphoretic. No erythema.  Psychiatric: Her mood appears not anxious. Her affect is inappropriate. Her affect is not angry, not blunt and not labile. Her speech is delayed. Her speech is not rapid and/or pressured, not tangential and not slurred. She is slowed. She is not agitated, not aggressive, not hyperactive, not withdrawn and not actively hallucinating. Thought content is not paranoid and not delusional. Cognition and memory are impaired. She expresses impulsivity and inappropriate judgment. She does not exhibit a depressed mood. She is communicative. She exhibits abnormal recent memory. She exhibits normal remote memory. She is attentive.    Filed Vitals:   02/07/14 1335  BP:  116/68  Pulse: 66  Temp: 98.2 F (36.8 C)  TempSrc: Tympanic  Resp: 16      Labs reviewed: Basic Metabolic Panel:  Recent Labs  16/10/96 05/16/13 08/15/13 09/27/13 10/05/13  NA 136* 136* 134* 136*  --   K 3.9 4.1 3.5 3.7  --   BUN 17 21 25* 18  --   CREATININE 0.6 0.7 0.7 0.7  --   TSH 1.12  --   --   --  1.12   Liver Function Tests:  Recent Labs  03/09/13  AST 12*  ALT 8  ALKPHOS 54    CBC:     Component Value Date/Time   WBC 5.8 10/05/2013   WBC 6.0 05/28/2012 1205   RBC 4.38 05/28/2012 1205   HGB 12.4 10/05/2013   HCT 37 10/05/2013   PLT 152 10/05/2013   MCV 90.9 05/28/2012 1205   MCH 29.9 05/28/2012 1205   MCHC 32.9 05/28/2012 1205   RDW 12.9 05/28/2012 1205   LYMPHSABS 1.4 05/28/2012 1205   MONOABS 0.6 05/28/2012 1205   EOSABS 0.1 05/28/2012 1205   BASOSABS 0.0 05/28/2012 1205      Assessment/Plan Hypertension Controlled on Amlodipine 5mg  and Metoprolol 50mg  bid.    DEMENTIA Progressing and frequent falling, now in SNF for care. Continue Aricept and Namenda.   OSTEOARTHROSIS, GENERALIZED, MULTIPLE SITES c/o  pain in her right shoulder and shoulder blade(no decreased ROM of the right shoulder) and lower back today.  Hx of middle of her spine vertebral body got out of alignment from falling on staircase in 1950--chronic on and off pain since the event. Tylenol 650 q6hr and Celebrex 200mg  daily for pain, Prilosec 20mg  daily for GI protection--pain is better controlled.   Unspecified constipation C/o loose bowels--hx IBS, resolved since MiraLax prn and dded Colace 100mg  bid     Depression with anxiety Stable, takes Mirtazapine 7.5mg  nightly, weights stable # 126 since May 2014    Gait disorder Worse-POA declined w/c. Ambulates with walker with SBA     Family/ Staff Communication: observe the patient  Goals of Care: SNF  Labs/tests ordered: none

## 2014-02-07 NOTE — Assessment & Plan Note (Signed)
C/o loose bowels--hx IBS, resolved since MiraLax prn and dded Colace 100mg  bid

## 2014-02-07 NOTE — Assessment & Plan Note (Signed)
Worse-POA declined w/c. Ambulates with walker with SBA

## 2014-02-07 NOTE — Assessment & Plan Note (Signed)
c/o  pain in her right shoulder and shoulder blade(no decreased ROM of the right shoulder) and lower back today.  Hx of middle of her spine vertebral body got out of alignment from falling on staircase in 1950--chronic on and off pain since the event. Tylenol 650 q6hr and Celebrex 200mg daily for pain, Prilosec 20mg daily for GI protection--pain is better controlled. 

## 2014-03-07 ENCOUNTER — Non-Acute Institutional Stay (SKILLED_NURSING_FACILITY): Payer: Medicare Other | Admitting: Nurse Practitioner

## 2014-03-07 DIAGNOSIS — R269 Unspecified abnormalities of gait and mobility: Secondary | ICD-10-CM

## 2014-03-07 DIAGNOSIS — F418 Other specified anxiety disorders: Secondary | ICD-10-CM

## 2014-03-07 DIAGNOSIS — K5900 Constipation, unspecified: Secondary | ICD-10-CM | POA: Diagnosis not present

## 2014-03-07 DIAGNOSIS — D649 Anemia, unspecified: Secondary | ICD-10-CM | POA: Diagnosis not present

## 2014-03-07 DIAGNOSIS — F068 Other specified mental disorders due to known physiological condition: Secondary | ICD-10-CM

## 2014-03-07 DIAGNOSIS — F341 Dysthymic disorder: Secondary | ICD-10-CM

## 2014-03-07 DIAGNOSIS — I10 Essential (primary) hypertension: Secondary | ICD-10-CM | POA: Diagnosis not present

## 2014-03-07 DIAGNOSIS — M159 Polyosteoarthritis, unspecified: Secondary | ICD-10-CM

## 2014-03-07 DIAGNOSIS — K219 Gastro-esophageal reflux disease without esophagitis: Secondary | ICD-10-CM

## 2014-03-07 DIAGNOSIS — N318 Other neuromuscular dysfunction of bladder: Secondary | ICD-10-CM

## 2014-03-09 ENCOUNTER — Encounter: Payer: Self-pay | Admitting: Nurse Practitioner

## 2014-03-09 NOTE — Assessment & Plan Note (Signed)
Off Fe. Hgb 12.4 10/05/14

## 2014-03-09 NOTE — Assessment & Plan Note (Signed)
c/o  pain in her right shoulder and shoulder blade(no decreased ROM of the right shoulder) and lower back today.  Hx of middle of her spine vertebral body got out of alignment from falling on staircase in 1950--chronic on and off pain since the event. Tylenol 650 q6hr and Celebrex 200mg  daily for pain, Prilosec 20mg  daily for GI protection--pain is better controlled.

## 2014-03-09 NOTE — Assessment & Plan Note (Signed)
Stable, takes Mirtazapine 7.5mg nightly, weights stable.    

## 2014-03-09 NOTE — Progress Notes (Signed)
Patient ID: Jamie [name], female   DOB: Jun 14, 1920, 80 y.o.   MRN: [id number]   Code Status: DNR  Allergies  Allergen Reactions  . Alendronate Sodium     REACTION: GI    Chief Complaint  Patient presents with  . Medical Managment of Chronic Issues    HPI: Patient is a 80 y.o. female seen in the SNF at [facility] today for evaluation of  chronic medical conditions.  Problem List Items Addressed This Visit   Anemia - Primary     Off Fe. Hgb 12.4 10/05/14    Constipation     Stable, takes Colace bid and MiraLax prn.     DEMENTIA     Progressing and frequent falling, now in SNF for care. Continue Aricept and Namenda.     Depression with anxiety     Stable, takes Mirtazapine 7.5mg  nightly, weights stable.       Gait disorder     Ambulates with walker with SBA     GERD (gastroesophageal reflux disease)     Stable on Prilosec 20mg  daily.       Hypertension     Controlled on Amlodipine 5mg  and Metoprolol 50mg  bid.      OSTEOARTHROSIS, GENERALIZED, MULTIPLE SITES     c/o  pain in her right shoulder and shoulder blade(no decreased ROM of the right shoulder) and lower back today.  Hx of middle of her spine vertebral body got out of alignment from falling on staircase in 1950--chronic on and off pain since the event. Tylenol 650 q6hr and Celebrex 200mg  daily for pain, Prilosec 20mg  daily for GI protection--pain is better controlled.    OVERACTIVE BLADDER     C/o frequent bathroom trips that interfere her night-better since on  Myrbetriq started-no change since therapeutic exchanged to Oxybutynin, but seems more confusion than prior-dc'd Oxybutynin-no change in her symptoms.     Unspecified constipation     MiraLax prn and Colace 100mg  bid-stable.          Review of Systems:  Review of Systems  Constitutional: Negative for fever, chills, weight loss, malaise/fatigue and diaphoresis.  HENT: Positive for hearing loss. Negative for congestion, ear pain and sore  throat.   Eyes: Negative for pain, discharge and redness.  Respiratory: Negative for cough, sputum production, shortness of breath and wheezing.   Cardiovascular: Negative for chest pain, orthopnea, claudication, leg swelling and PND.  Gastrointestinal: Negative for heartburn, nausea, vomiting, abdominal pain, diarrhea, constipation and blood in stool.  Genitourinary: Positive for frequency. Negative for dysuria, urgency, hematuria and flank pain.  Musculoskeletal: Positive for back pain, falls and joint pain. Negative for myalgias and neck pain.       Gait instability.   Skin: Negative for itching and rash.  Neurological: Positive for weakness (generalized. ). Negative for dizziness, tingling, tremors, sensory change, speech change, focal weakness, seizures, loss of consciousness and headaches.  Endo/Heme/Allergies: Negative for environmental allergies and polydipsia. Does not bruise/bleed easily.  Psychiatric/Behavioral: Positive for memory loss. Negative for depression and hallucinations. The patient is not nervous/anxious and does not have insomnia.      Past Medical History  Diagnosis Date  . Osteoporosis   . Allergic rhinitis   . Dementia   . Osteoarthritis     hands  . IBS (irritable bowel syndrome)   . GERD (gastroesophageal reflux disease)   . Hypertension   . Palpitation     a. PAC's & PVC's by Holter - 2011  . Tortuous  colon 2002  . Descending thoracic aortic dissection 04/24/2010    a. MRA 2011 - begins distal to left subclavian and extends throughout the full length of the Ao.  Marland Kitchen. Aortic insufficiency 04/16/2010    a. Echo 2011 - mild to moderate AI, EF 60%.  . Anemia, unspecified 01/12/2013  . Other abnormal blood chemistry 01/12/2013  . Lumbago 10/06/2012  . Personal history of fall 10/06/2012  . Hyposmolality and/or hyponatremia 09/29/2012  . Otitis externa 09/29/2012  . Impacted cerumen 09/29/2012  . Unspecified conductive hearing loss 09/29/2012  . External hemorrhoids  without mention of complication 09/29/2012  . Unspecified venous (peripheral) insufficiency 09/29/2012  . Unspecified constipation 09/29/2012  . Abnormality of gait 09/29/2012  . Congestive heart failure, unspecified 06/30/2003   Past Surgical History  Procedure Laterality Date  . Dilation and curettage of uterus  1960  . Cataract extraction w/ intraocular lens implant     Social History:   reports that she has never smoked. She has never used smokeless tobacco. She reports that she does not drink alcohol or use illicit drugs.  Family History  Problem Relation Age of Onset  . Diabetes Mother   . Coronary artery disease Father   . Hypertension Father   . Diabetes      sisters    Medications: Patient's Medications  New Prescriptions   No medications on file  Previous Medications   ACETAMINOPHEN (TYLENOL) 325 MG TABLET    Take 325 mg by mouth. Take two tablets every 6 hours   AMLODIPINE (NORVASC) 5 MG TABLET    Take 1 tablet (5 mg total) by mouth daily.   ARTIFICIAL TEAR SOLUTION (TEARS NATURALE II OP)    Apply 1 drop to eye daily. Both eyes twice daily   AZELASTINE (ASTELIN) 137 MCG/SPRAY NASAL SPRAY    Place 2 sprays into the nose. At bedtime   CALCIUM CITRATE-VITAMIN D (CITRACAL+D) 315-200 MG-UNIT PER TABLET    Take 2 tablets by mouth. Take twice daily   CELECOXIB (CELEBREX) 200 MG CAPSULE    Take 200 mg by mouth daily.   CHOLECALCIFEROL (VITAMIN D) 2000 UNITS CAPS    Take by mouth daily.     DOCUSATE SODIUM (COLACE) 100 MG CAPSULE    Take 100 mg by mouth 2 (two) times daily.   DONEPEZIL (ARICEPT) 10 MG TABLET    Take 1 tablet (10 mg total) by mouth daily.   MEMANTINE (NAMENDA) 10 MG TABLET    Take 28 mg by mouth daily.    METOPROLOL (LOPRESSOR) 50 MG TABLET    Take 1 tablet (50 mg total) by mouth 2 (two) times daily.   MIRTAZAPINE (REMERON) 7.5 MG TABLET    Take 7.5 mg by mouth at bedtime.   MULTIPLE VITAMINS-MINERALS (CENTRUM PO)    Take 1 tablet by mouth 1 day or 1 dose.    OMEPRAZOLE (PRILOSEC) 20 MG CAPSULE    Take 20 mg by mouth daily.   POLYETHYLENE GLYCOL (MIRALAX / GLYCOLAX) PACKET    Take 17 g by mouth. Fill cap to 17 gm mark, mix with 4 oz of fluid and take by mouth every other day.  Modified Medications   No medications on file  Discontinued Medications   No medications on file     Physical Exam: Physical Exam  Constitutional: She appears well-developed and well-nourished. No distress.  HENT:  Head: Normocephalic and atraumatic.  Nose: Nose normal.  Mouth/Throat: Oropharynx is clear and moist. No oropharyngeal exudate.  Eyes: Conjunctivae  and EOM are normal. Pupils are equal, round, and reactive to light. Right eye exhibits no discharge. Left eye exhibits no discharge. No scleral icterus.  Neck: Normal range of motion. Neck supple. No JVD present. No thyromegaly present.  Cardiovascular: Normal rate, regular rhythm and normal heart sounds.   No murmur heard. Pulmonary/Chest: Effort normal and breath sounds normal. No respiratory distress. She has no wheezes. She has no rales.  Abdominal: Soft. Bowel sounds are normal. She exhibits no distension. There is no tenderness.  Musculoskeletal: Normal range of motion. She exhibits tenderness. She exhibits no edema.  Pain in the right shoulder/shoulder blade, neck, middle/lower back-gait instability and frequent falling.   Lymphadenopathy:    She has no cervical adenopathy.  Neurological: She is alert. She has normal reflexes. No cranial nerve deficit. She exhibits normal muscle tone. Coordination normal.  Skin: Skin is warm and dry. No rash noted. She is not diaphoretic. No erythema.  Psychiatric: Her mood appears not anxious. Her affect is inappropriate. Her affect is not angry, not blunt and not labile. Her speech is delayed. Her speech is not rapid and/or pressured, not tangential and not slurred. She is slowed. She is not agitated, not aggressive, not hyperactive, not withdrawn and not actively  hallucinating. Thought content is not paranoid and not delusional. Cognition and memory are impaired. She expresses impulsivity and inappropriate judgment. She does not exhibit a depressed mood. She is communicative. She exhibits abnormal recent memory. She exhibits normal remote memory. She is attentive.    Filed Vitals:   03/07/14 1551  BP: 127/75  Pulse: 66  Temp: 98.8 F (37.1 C)  TempSrc: Tympanic  Resp: 16      Labs reviewed: Basic Metabolic Panel:  Recent Labs  09/32/35 08/15/13 09/27/13 10/05/13  NA 136* 134* 136*  --   K 4.1 3.5 3.7  --   BUN 21 25* 18  --   CREATININE 0.7 0.7 0.7  --   TSH  --   --   --  1.12    CBC:     Component Value Date/Time   WBC 5.8 10/05/2013   WBC 6.0 05/28/2012 1205   RBC 4.38 05/28/2012 1205   HGB 12.4 10/05/2013   HCT 37 10/05/2013   PLT 152 10/05/2013   MCV 90.9 05/28/2012 1205   MCH 29.9 05/28/2012 1205   MCHC 32.9 05/28/2012 1205   RDW 12.9 05/28/2012 1205   LYMPHSABS 1.4 05/28/2012 1205   MONOABS 0.6 05/28/2012 1205   EOSABS 0.1 05/28/2012 1205   BASOSABS 0.0 05/28/2012 1205   Assessment/Plan Anemia Off Fe. Hgb 12.4 10/05/14  Constipation Stable, takes Colace bid and MiraLax prn.   DEMENTIA Progressing and frequent falling, now in SNF for care. Continue Aricept and Namenda.   Depression with anxiety Stable, takes Mirtazapine 7.5mg  nightly, weights stable.     Gait disorder Ambulates with walker with SBA   GERD (gastroesophageal reflux disease) Stable on Prilosec 20mg  daily.     Hypertension Controlled on Amlodipine 5mg  and Metoprolol 50mg  bid.    OSTEOARTHROSIS, GENERALIZED, MULTIPLE SITES c/o  pain in her right shoulder and shoulder blade(no decreased ROM of the right shoulder) and lower back today.  Hx of middle of her spine vertebral body got out of alignment from falling on staircase in 1950--chronic on and off pain since the event. Tylenol 650 q6hr and Celebrex 200mg  daily for pain, Prilosec 20mg  daily for GI  protection--pain is better controlled.  OVERACTIVE BLADDER C/o frequent bathroom trips that interfere  her night-better since on  Myrbetriq started-no change since therapeutic exchanged to Oxybutynin, but seems more confusion than prior-dc'd Oxybutynin-no change in her symptoms.   Unspecified constipation MiraLax prn and Colace 100mg  bid-stable.       Family/ Staff Communication: observe the patient  Goals of Care: SNF  Labs/tests ordered: none

## 2014-03-09 NOTE — Assessment & Plan Note (Signed)
Progressing and frequent falling, now in SNF for care. Continue Aricept and Namenda.

## 2014-03-09 NOTE — Assessment & Plan Note (Signed)
Ambulates with walker with SBA 

## 2014-03-09 NOTE — Assessment & Plan Note (Signed)
MiraLax prn and Colace 100mg bid-stable.     

## 2014-03-09 NOTE — Assessment & Plan Note (Signed)
C/o frequent bathroom trips that interfere her night-better since on  Myrbetriq started-no change since therapeutic exchanged to Oxybutynin, but seems more confusion than prior-dc'd Oxybutynin-no change in her symptoms.

## 2014-03-09 NOTE — Assessment & Plan Note (Signed)
Controlled on Amlodipine 5mg and Metoprolol 50mg bid.     

## 2014-03-09 NOTE — Assessment & Plan Note (Signed)
Stable on Prilosec 20 mg daily. 

## 2014-03-09 NOTE — Assessment & Plan Note (Signed)
Stable, takes Colace bid and MiraLax prn.   

## 2014-03-22 ENCOUNTER — Encounter: Payer: Self-pay | Admitting: Nurse Practitioner

## 2014-03-22 ENCOUNTER — Non-Acute Institutional Stay (SKILLED_NURSING_FACILITY): Payer: Medicare Other | Admitting: Nurse Practitioner

## 2014-03-22 DIAGNOSIS — F068 Other specified mental disorders due to known physiological condition: Secondary | ICD-10-CM

## 2014-03-22 DIAGNOSIS — N390 Urinary tract infection, site not specified: Secondary | ICD-10-CM | POA: Diagnosis not present

## 2014-03-22 DIAGNOSIS — K219 Gastro-esophageal reflux disease without esophagitis: Secondary | ICD-10-CM | POA: Diagnosis not present

## 2014-03-22 DIAGNOSIS — M159 Polyosteoarthritis, unspecified: Secondary | ICD-10-CM | POA: Diagnosis not present

## 2014-03-22 DIAGNOSIS — F341 Dysthymic disorder: Secondary | ICD-10-CM

## 2014-03-22 DIAGNOSIS — F418 Other specified anxiety disorders: Secondary | ICD-10-CM

## 2014-03-22 DIAGNOSIS — W19XXXA Unspecified fall, initial encounter: Secondary | ICD-10-CM

## 2014-03-22 DIAGNOSIS — I10 Essential (primary) hypertension: Secondary | ICD-10-CM | POA: Diagnosis not present

## 2014-03-22 DIAGNOSIS — K5900 Constipation, unspecified: Secondary | ICD-10-CM

## 2014-03-22 NOTE — Assessment & Plan Note (Signed)
Controlled on Amlodipine 5mg and Metoprolol 50mg bid.     

## 2014-03-22 NOTE — Progress Notes (Signed)
Patient ID: Jamie [name], female   DOB: May 24, 1920, 80 y.o.   MRN: [id number]   Code Status: DNR  Allergies  Allergen Reactions  . Alendronate Sodium     REACTION: GI    Chief Complaint  Patient presents with  . Medical Management of Chronic Issues  . Dementia  . Acute Visit    worsened back pain and aches allover.     HPI: Patient is a 80 y.o. female seen in the SNF at [facility] today for evaluation of worsened lower back pain/aches allover and  chronic medical conditions.  Problem List Items Addressed This Visit   DEMENTIA     Progressing and frequent falling, now in SNF for care. Continue Aricept and Namenda.     Depression with anxiety     Stable, takes Mirtazapine 7.5mg  nightly, weights stable.       Fall     Not clear of the mechanism of her falling 03/22/14. Staff reported the patient crawled out in hallway on floor w/o apparent injury. The patient is lack of safety awareness and doesn't always remember to use call system to request for help. Intensive supervision needed for safety.     GERD (gastroesophageal reflux disease)     Stable on Prilosec 20mg  daily.      Hypertension     Controlled on Amlodipine 5mg  and Metoprolol 50mg  bid.     OSTEOARTHROSIS, GENERALIZED, MULTIPLE SITES - Primary     Worsened lower back pain and c/o aches allover. Will obtain UA C/S, CBC, CMP. Also adding Hydrocodone/acetaminophen 5/325 q4h prn. Continue Celebrex 200mg  daily and Tylenol 650mg  tid. Observe the patient.     Relevant Medications      HYDROcodone-acetaminophen (NORCO/VICODIN) 5-325 MG per tablet   Unspecified constipation     Stable, takes Colace bid and MiraLax prn.         Review of Systems:  Review of Systems  Constitutional: Negative for fever, chills, weight loss, malaise/fatigue and diaphoresis.  HENT: Positive for hearing loss. Negative for congestion, ear pain and sore throat.   Eyes: Negative for pain, discharge and redness.  Respiratory:  Negative for cough, sputum production, shortness of breath and wheezing.   Cardiovascular: Negative for chest pain, orthopnea, claudication, leg swelling and PND.  Gastrointestinal: Negative for heartburn, nausea, vomiting, abdominal pain, diarrhea, constipation and blood in stool.  Genitourinary: Positive for frequency. Negative for dysuria, urgency, hematuria and flank pain.  Musculoskeletal: Positive for back pain, falls and joint pain. Negative for myalgias and neck pain.       Gait instability. Worsened lower back pain. C/o aches allover-takes Celebrex and Tylenol routinely-asked for prn Tylenol frequently. Staff reported the patient crawled out in hallway on floor 03/22/14 w/o apparent injury.   Skin: Negative for itching and rash.  Neurological: Positive for weakness (generalized. ). Negative for dizziness, tingling, tremors, sensory change, speech change, focal weakness, seizures, loss of consciousness and headaches.  Endo/Heme/Allergies: Negative for environmental allergies and polydipsia. Does not bruise/bleed easily.  Psychiatric/Behavioral: Positive for memory loss. Negative for depression and hallucinations. The patient is not nervous/anxious and does not have insomnia.      Past Medical History  Diagnosis Date  . Osteoporosis   . Allergic rhinitis   . Dementia   . Osteoarthritis     hands  . IBS (irritable bowel syndrome)   . GERD (gastroesophageal reflux disease)   . Hypertension   . Palpitation     a. PAC's & PVC's by Holter -  2011  . Tortuous colon 2002  . Descending thoracic aortic dissection 04/24/2010    a. MRA 2011 - begins distal to left subclavian and extends throughout the full length of the Ao.  Marland Kitchen. Aortic insufficiency 04/16/2010    a. Echo 2011 - mild to moderate AI, EF 60%.  . Anemia, unspecified 01/12/2013  . Other abnormal blood chemistry 01/12/2013  . Lumbago 10/06/2012  . Personal history of fall 10/06/2012  . Hyposmolality and/or hyponatremia 09/29/2012  .  Otitis externa 09/29/2012  . Impacted cerumen 09/29/2012  . Unspecified conductive hearing loss 09/29/2012  . External hemorrhoids without mention of complication 09/29/2012  . Unspecified venous (peripheral) insufficiency 09/29/2012  . Unspecified constipation 09/29/2012  . Abnormality of gait 09/29/2012  . Congestive heart failure, unspecified 06/30/2003   Past Surgical History  Procedure Laterality Date  . Dilation and curettage of uterus  1960  . Cataract extraction w/ intraocular lens implant     Social History:   reports that she has never smoked. She has never used smokeless tobacco. She reports that she does not drink alcohol or use illicit drugs.  Family History  Problem Relation Age of Onset  . Diabetes Mother   . Coronary artery disease Father   . Hypertension Father   . Diabetes      sisters    Medications: Patient's Medications  New Prescriptions   No medications on file  Previous Medications   ACETAMINOPHEN (TYLENOL) 325 MG TABLET    Take 325 mg by mouth. Take two tablets every 6 hours   AMLODIPINE (NORVASC) 5 MG TABLET    Take 1 tablet (5 mg total) by mouth daily.   ARTIFICIAL TEAR SOLUTION (TEARS NATURALE II OP)    Apply 1 drop to eye daily. Both eyes twice daily   AZELASTINE (ASTELIN) 137 MCG/SPRAY NASAL SPRAY    Place 2 sprays into the nose. At bedtime   CALCIUM CITRATE-VITAMIN D (CITRACAL+D) 315-200 MG-UNIT PER TABLET    Take 2 tablets by mouth. Take twice daily   CELECOXIB (CELEBREX) 200 MG CAPSULE    Take 200 mg by mouth daily.   CHOLECALCIFEROL (VITAMIN D) 2000 UNITS CAPS    Take by mouth daily.     DOCUSATE SODIUM (COLACE) 100 MG CAPSULE    Take 100 mg by mouth 2 (two) times daily.   DONEPEZIL (ARICEPT) 10 MG TABLET    Take 1 tablet (10 mg total) by mouth daily.   HYDROCODONE-ACETAMINOPHEN (NORCO/VICODIN) 5-325 MG PER TABLET    Take 1 tablet by mouth every 4 (four) hours as needed for moderate pain.   MEMANTINE (NAMENDA) 10 MG TABLET    Take 28 mg by mouth  daily.    METOPROLOL (LOPRESSOR) 50 MG TABLET    Take 1 tablet (50 mg total) by mouth 2 (two) times daily.   MIRTAZAPINE (REMERON) 7.5 MG TABLET    Take 7.5 mg by mouth at bedtime.   MULTIPLE VITAMINS-MINERALS (CENTRUM PO)    Take 1 tablet by mouth 1 day or 1 dose.   OMEPRAZOLE (PRILOSEC) 20 MG CAPSULE    Take 20 mg by mouth daily.   POLYETHYLENE GLYCOL (MIRALAX / GLYCOLAX) PACKET    Take 17 g by mouth. Fill cap to 17 gm mark, mix with 4 oz of fluid and take by mouth every other day.  Modified Medications   No medications on file  Discontinued Medications   No medications on file     Physical Exam: Physical Exam  Constitutional: She appears  well-developed and well-nourished. No distress.  HENT:  Head: Normocephalic and atraumatic.  Nose: Nose normal.  Mouth/Throat: Oropharynx is clear and moist. No oropharyngeal exudate.  Eyes: Conjunctivae and EOM are normal. Pupils are equal, round, and reactive to light. Right eye exhibits no discharge. Left eye exhibits no discharge. No scleral icterus.  Neck: Normal range of motion. Neck supple. No JVD present. No thyromegaly present.  Cardiovascular: Normal rate, regular rhythm and normal heart sounds.   No murmur heard. Pulmonary/Chest: Effort normal and breath sounds normal. No respiratory distress. She has no wheezes. She has no rales.  Abdominal: Soft. Bowel sounds are normal. She exhibits no distension. There is no tenderness.  Musculoskeletal: Normal range of motion. She exhibits tenderness. She exhibits no edema.  Pain in the right shoulder/shoulder blade, neck, middle/lower back-gait instability and frequent falling. C/o aches allover.   Lymphadenopathy:    She has no cervical adenopathy.  Neurological: She is alert. She has normal reflexes. No cranial nerve deficit. She exhibits normal muscle tone. Coordination normal.  Skin: Skin is warm and dry. No rash noted. She is not diaphoretic. No erythema.  Psychiatric: Her mood appears not  anxious. Her affect is inappropriate. Her affect is not angry, not blunt and not labile. Her speech is delayed. Her speech is not rapid and/or pressured, not tangential and not slurred. She is slowed. She is not agitated, not aggressive, not hyperactive, not withdrawn and not actively hallucinating. Thought content is not paranoid and not delusional. Cognition and memory are impaired. She expresses impulsivity and inappropriate judgment. She does not exhibit a depressed mood. She is communicative. She exhibits abnormal recent memory. She exhibits normal remote memory. She is attentive.    Filed Vitals:   03/22/14 0936  BP: 130/74  Pulse: 74  Temp: 98.6 F (37 C)  TempSrc: Tympanic  Resp: 18      Labs reviewed: Basic Metabolic Panel:  Recent Labs  16/10/96 08/15/13 09/27/13 10/05/13  NA 136* 134* 136*  --   K 4.1 3.5 3.7  --   BUN 21 25* 18  --   CREATININE 0.7 0.7 0.7  --   TSH  --   --   --  1.12    CBC:     Component Value Date/Time   WBC 5.8 10/05/2013   WBC 6.0 05/28/2012 1205   RBC 4.38 05/28/2012 1205   HGB 12.4 10/05/2013   HCT 37 10/05/2013   PLT 152 10/05/2013   MCV 90.9 05/28/2012 1205   MCH 29.9 05/28/2012 1205   MCHC 32.9 05/28/2012 1205   RDW 12.9 05/28/2012 1205   LYMPHSABS 1.4 05/28/2012 1205   MONOABS 0.6 05/28/2012 1205   EOSABS 0.1 05/28/2012 1205   BASOSABS 0.0 05/28/2012 1205   Assessment/Plan OSTEOARTHROSIS, GENERALIZED, MULTIPLE SITES Worsened lower back pain and c/o aches allover. Will obtain UA C/S, CBC, CMP. Also adding Hydrocodone/acetaminophen 5/325 q4h prn. Continue Celebrex 200mg  daily and Tylenol 650mg  tid. Observe the patient.   Hypertension Controlled on Amlodipine 5mg  and Metoprolol 50mg  bid.   DEMENTIA Progressing and frequent falling, now in SNF for care. Continue Aricept and Namenda.   GERD (gastroesophageal reflux disease) Stable on Prilosec 20mg  daily.    Depression with anxiety Stable, takes Mirtazapine 7.5mg  nightly, weights stable.      Unspecified constipation Stable, takes Colace bid and MiraLax prn.    Fall Not clear of the mechanism of her falling 03/22/14. Staff reported the patient crawled out in hallway on floor w/o apparent injury. The  patient is lack of safety awareness and doesn't always remember to use call system to request for help. Intensive supervision needed for safety.     Family/ Staff Communication: observe the patient  Goals of Care: SNF  Labs/tests ordered: Cath UA C/S pending. CBC and CMP in am.

## 2014-03-22 NOTE — Assessment & Plan Note (Signed)
Not clear of the mechanism of her falling 03/22/14. Staff reported the patient crawled out in hallway on floor w/o apparent injury. The patient is lack of safety awareness and doesn't always remember to use call system to request for help. Intensive supervision needed for safety.

## 2014-03-22 NOTE — Assessment & Plan Note (Signed)
Stable, takes Mirtazapine 7.5mg nightly, weights stable.    

## 2014-03-22 NOTE — Assessment & Plan Note (Signed)
Worsened lower back pain and c/o aches allover. Will obtain UA C/S, CBC, CMP. Also adding Hydrocodone/acetaminophen 5/325 q4h prn. Continue Celebrex 200mg  daily and Tylenol 650mg  tid. Observe the patient.

## 2014-03-22 NOTE — Assessment & Plan Note (Signed)
Stable, takes Colace bid and MiraLax prn.

## 2014-03-22 NOTE — Assessment & Plan Note (Signed)
Progressing and frequent falling, now in SNF for care. Continue Aricept and Namenda.  

## 2014-03-22 NOTE — Assessment & Plan Note (Signed)
Stable on Prilosec 20 mg daily. 

## 2014-03-23 DIAGNOSIS — D649 Anemia, unspecified: Secondary | ICD-10-CM | POA: Diagnosis not present

## 2014-03-23 DIAGNOSIS — I10 Essential (primary) hypertension: Secondary | ICD-10-CM | POA: Diagnosis not present

## 2014-03-23 LAB — BASIC METABOLIC PANEL
BUN: 33 mg/dL — AB (ref 4–21)
Creatinine: 0.8 mg/dL (ref 0.5–1.1)
GLUCOSE: 89 mg/dL
POTASSIUM: 4.5 mmol/L (ref 3.4–5.3)
SODIUM: 138 mmol/L (ref 137–147)

## 2014-03-23 LAB — HEPATIC FUNCTION PANEL
ALT: 9 U/L (ref 7–35)
AST: 15 U/L (ref 13–35)
Alkaline Phosphatase: 42 U/L (ref 25–125)
Bilirubin, Total: 0.5 mg/dL

## 2014-03-23 LAB — CBC AND DIFFERENTIAL
HEMATOCRIT: 39 % (ref 36–46)
HEMOGLOBIN: 12.7 g/dL (ref 12.0–16.0)
Platelets: 123 10*3/uL — AB (ref 150–399)
WBC: 5.8 10^3/mL

## 2014-04-09 ENCOUNTER — Non-Acute Institutional Stay (SKILLED_NURSING_FACILITY): Payer: Medicare Other | Admitting: Nurse Practitioner

## 2014-04-09 ENCOUNTER — Encounter: Payer: Self-pay | Admitting: Nurse Practitioner

## 2014-04-09 DIAGNOSIS — W19XXXA Unspecified fall, initial encounter: Secondary | ICD-10-CM | POA: Diagnosis not present

## 2014-04-09 DIAGNOSIS — I10 Essential (primary) hypertension: Secondary | ICD-10-CM

## 2014-04-09 DIAGNOSIS — K219 Gastro-esophageal reflux disease without esophagitis: Secondary | ICD-10-CM | POA: Diagnosis not present

## 2014-04-09 DIAGNOSIS — M546 Pain in thoracic spine: Secondary | ICD-10-CM | POA: Diagnosis not present

## 2014-04-09 DIAGNOSIS — F418 Other specified anxiety disorders: Secondary | ICD-10-CM

## 2014-04-09 DIAGNOSIS — K5900 Constipation, unspecified: Secondary | ICD-10-CM

## 2014-04-09 DIAGNOSIS — F341 Dysthymic disorder: Secondary | ICD-10-CM

## 2014-04-09 DIAGNOSIS — F068 Other specified mental disorders due to known physiological condition: Secondary | ICD-10-CM | POA: Diagnosis not present

## 2014-04-09 NOTE — Assessment & Plan Note (Signed)
The patient is lack of safety awareness and doesn't always remember to use call system to request for help. Intensive supervision needed for safety. Medication reduction: dc Celebrex and reduce Norco to daily and Tylenol to prn.

## 2014-04-09 NOTE — Assessment & Plan Note (Signed)
ontrolled on Amlodipine 5mg  and Metoprolol 50mg  bid.

## 2014-04-09 NOTE — Assessment & Plan Note (Signed)
C/o pain after eats. Dc Celebrex and continue Prilosec 20mg  daily. Observe the patient.

## 2014-04-09 NOTE — Assessment & Plan Note (Signed)
Stable, takes Mirtazapine 7.5mg nightly, weights stable.    

## 2014-04-09 NOTE — Progress Notes (Signed)
Patient ID: Jamie [name], female   DOB: 08/09/1920, 80 y.o.   MRN: [id number]   Code Status: DNR  Allergies  Allergen Reactions  . Alendronate Sodium     REACTION: GI    Chief Complaint  Patient presents with  . Medical Management of Chronic Issues  . Acute Visit    drowsiness, stomach pain after eating, back pain, s/p fall.     HPI: Patient is a 80 y.o. female [facility] for evaluation of chronic lower back pain/aches allover, drowsiness, s/p fall, stomach pain after eats,  and  chronic medical conditions.  Problem List Items Addressed This Visit   DEMENTIA     rogressing and frequent falling, now in SNF for care. Continue Aricept and Namenda.     Depression with anxiety     Stable, takes Mirtazapine 7.5mg  nightly, weights stable.      Fall     The patient is lack of safety awareness and doesn't always remember to use call system to request for help. Intensive supervision needed for safety. Medication reduction: dc Celebrex and reduce Norco to daily and Tylenol to prn.      GERD (gastroesophageal reflux disease)     C/o pain after eats. Dc Celebrex and continue Prilosec 20mg  daily. Observe the patient.       Hypertension     ontrolled on Amlodipine 5mg  and Metoprolol 50mg  bid.      Thoracic back pain - Primary     c/o  pain in her right shoulder and shoulder blade(no decreased ROM of the right shoulder) and lower back  Hx of middle of her spine vertebral body got out of alignment from falling on staircase in 1950--chronic on and off pain since the event. Failed  Tylenol 650mg  qid-changed to prn now and Celebrex 200mg  daily-dc'd 04/09/14 due to inadequate pain control and c/o stomach pain after eats. Prilosec 20mg  daily for GI protection. Persisted chronic pain is managed with Norco bid started 04/04/14-apparent the patient is too sedating while taking bid-will reduce to daily in am. Prn Norco is still available to her.         Unspecified constipation     MiraLax prn and Colace 100mg  bid-stable.          Review of Systems:  Review of Systems  Constitutional: Positive for malaise/fatigue. Negative for fever, chills, weight loss and diaphoresis.  HENT: Positive for hearing loss. Negative for congestion, ear pain and sore throat.   Eyes: Negative for pain, discharge and redness.  Respiratory: Negative for cough, sputum production, shortness of breath and wheezing.   Cardiovascular: Negative for chest pain, orthopnea, claudication, leg swelling and PND.  Gastrointestinal: Positive for abdominal pain. Negative for heartburn, nausea, vomiting, diarrhea, constipation and blood in stool.       C/o stomach pain after eats  Genitourinary: Positive for frequency. Negative for dysuria, urgency, hematuria and flank pain.  Musculoskeletal: Positive for back pain, falls and joint pain. Negative for myalgias and neck pain.       Gait instability. Worsened lower back pain. C/o aches allover-takes Celebrex and Tylenol routinely-asked for prn Tylenol frequently. Staff reported the patient crawled out in hallway on floor 03/22/14 w/o apparent injury.   Skin: Negative for itching and rash.  Neurological: Positive for weakness (generalized. ). Negative for dizziness, tingling, tremors, sensory change, speech change, focal weakness, seizures, loss of consciousness and headaches.  Endo/Heme/Allergies: Negative for environmental allergies and polydipsia. Does not bruise/bleed  easily.  Psychiatric/Behavioral: Positive for memory loss. Negative for depression and hallucinations. The patient is not nervous/anxious and does not have insomnia.      Past Medical History  Diagnosis Date  . Osteoporosis   . Allergic rhinitis   . Dementia   . Osteoarthritis     hands  . IBS (irritable bowel syndrome)   . GERD (gastroesophageal reflux disease)   . Hypertension   . Palpitation     a. PAC's & PVC's by Holter - 2011  . Tortuous colon 2002   . Descending thoracic aortic dissection 04/24/2010    a. MRA 2011 - begins distal to left subclavian and extends throughout the full length of the Ao.  Marland Kitchen Aortic insufficiency 04/16/2010    a. Echo 2011 - mild to moderate AI, EF 60%.  . Anemia, unspecified 01/12/2013  . Other abnormal blood chemistry 01/12/2013  . Lumbago 10/06/2012  . Personal history of fall 10/06/2012  . Hyposmolality and/or hyponatremia 09/29/2012  . Otitis externa 09/29/2012  . Impacted cerumen 09/29/2012  . Unspecified conductive hearing loss 09/29/2012  . External hemorrhoids without mention of complication 09/29/2012  . Unspecified venous (peripheral) insufficiency 09/29/2012  . Unspecified constipation 09/29/2012  . Abnormality of gait 09/29/2012  . Congestive heart failure, unspecified 06/30/2003   Past Surgical History  Procedure Laterality Date  . Dilation and curettage of uterus  1960  . Cataract extraction w/ intraocular lens implant     Social History:   reports that she has never smoked. She has never used smokeless tobacco. She reports that she does not drink alcohol or use illicit drugs.  Family History  Problem Relation Age of Onset  . Diabetes Mother   . Coronary artery disease Father   . Hypertension Father   . Diabetes      sisters    Medications: Patient's Medications  New Prescriptions   No medications on file  Previous Medications   ACETAMINOPHEN (TYLENOL) 325 MG TABLET    Take 650 mg by mouth every 6 (six) hours as needed.    AMLODIPINE (NORVASC) 5 MG TABLET    Take 1 tablet (5 mg total) by mouth daily.   ARTIFICIAL TEAR SOLUTION (TEARS NATURALE II OP)    Apply 1 drop to eye daily. Both eyes twice daily   AZELASTINE (ASTELIN) 137 MCG/SPRAY NASAL SPRAY    Place 2 sprays into the nose. At bedtime   CALCIUM CITRATE-VITAMIN D (CITRACAL+D) 315-200 MG-UNIT PER TABLET    Take 2 tablets by mouth. Take twice daily   CHOLECALCIFEROL (VITAMIN D) 2000 UNITS CAPS    Take by mouth daily.     DOCUSATE  SODIUM (COLACE) 100 MG CAPSULE    Take 100 mg by mouth 2 (two) times daily.   DONEPEZIL (ARICEPT) 10 MG TABLET    Take 1 tablet (10 mg total) by mouth daily.   HYDROCODONE-ACETAMINOPHEN (NORCO/VICODIN) 5-325 MG PER TABLET    Take 1 tablet by mouth every 4 (four) hours as needed for moderate pain. One tablet qam   MEMANTINE (NAMENDA) 10 MG TABLET    Take 28 mg by mouth daily.    METOPROLOL (LOPRESSOR) 50 MG TABLET    Take 1 tablet (50 mg total) by mouth 2 (two) times daily.   MIRTAZAPINE (REMERON) 7.5 MG TABLET    Take 7.5 mg by mouth at bedtime.   MULTIPLE VITAMINS-MINERALS (CENTRUM PO)    Take 1 tablet by mouth 1 day or 1 dose.   OMEPRAZOLE (PRILOSEC) 20 MG CAPSULE  Take 20 mg by mouth daily.   POLYETHYLENE GLYCOL (MIRALAX / GLYCOLAX) PACKET    Take 17 g by mouth. Fill cap to 17 gm mark, mix with 4 oz of fluid and take by mouth every other day.  Modified Medications   No medications on file  Discontinued Medications   CELECOXIB (CELEBREX) 200 MG CAPSULE    Take 200 mg by mouth daily.     Physical Exam: Physical Exam  Constitutional: She appears well-developed and well-nourished. No distress.  HENT:  Head: Normocephalic and atraumatic.  Nose: Nose normal.  Mouth/Throat: Oropharynx is clear and moist. No oropharyngeal exudate.  Eyes: Conjunctivae and EOM are normal. Pupils are equal, round, and reactive to light. Right eye exhibits no discharge. Left eye exhibits no discharge. No scleral icterus.  Neck: Normal range of motion. Neck supple. No JVD present. No thyromegaly present.  Cardiovascular: Normal rate, regular rhythm and normal heart sounds.   No murmur heard. Pulmonary/Chest: Effort normal and breath sounds normal. No respiratory distress. She has no wheezes. She has no rales.  Abdominal: Soft. Bowel sounds are normal. She exhibits no distension. There is no tenderness.  Musculoskeletal: Normal range of motion. She exhibits tenderness. She exhibits no edema.  Pain in the right  shoulder/shoulder blade, neck, middle/lower back-gait instability and frequent falling. C/o aches allover.   Lymphadenopathy:    She has no cervical adenopathy.  Neurological: She is alert. She has normal reflexes. No cranial nerve deficit. She exhibits normal muscle tone. Coordination normal.  Skin: Skin is warm and dry. No rash noted. She is not diaphoretic. No erythema.  Psychiatric: Her mood appears not anxious. Her affect is inappropriate. Her affect is not angry, not blunt and not labile. Her speech is delayed. Her speech is not rapid and/or pressured, not tangential and not slurred. She is slowed. She is not agitated, not aggressive, not hyperactive, not withdrawn and not actively hallucinating. Thought content is not paranoid and not delusional. Cognition and memory are impaired. She expresses impulsivity and inappropriate judgment. She does not exhibit a depressed mood. She is communicative. She exhibits abnormal recent memory. She exhibits normal remote memory. She is attentive.    Filed Vitals:   04/09/14 1005  BP: 160/90  Pulse: 78  Temp: 97.2 F (36.2 C)  TempSrc: Tympanic  Resp: 20      Labs reviewed: Basic Metabolic Panel:  Recent Labs  91/47/8209/23/14 09/27/13 10/05/13 03/23/14  NA 134* 136*  --  138  K 3.5 3.7  --  4.5  BUN 25* 18  --  33*  CREATININE 0.7 0.7  --  0.8  TSH  --   --  1.12  --     CBC:     Component Value Date/Time   WBC 5.8 03/23/2014   WBC 6.0 05/28/2012 1205   RBC 4.38 05/28/2012 1205   HGB 12.7 03/23/2014   HCT 39 03/23/2014   PLT 123* 03/23/2014   MCV 90.9 05/28/2012 1205   MCH 29.9 05/28/2012 1205   MCHC 32.9 05/28/2012 1205   RDW 12.9 05/28/2012 1205   LYMPHSABS 1.4 05/28/2012 1205   MONOABS 0.6 05/28/2012 1205   EOSABS 0.1 05/28/2012 1205   BASOSABS 0.0 05/28/2012 1205   Assessment/Plan Thoracic back pain c/o  pain in her right shoulder and shoulder blade(no decreased ROM of the right shoulder) and lower back  Hx of middle of her spine vertebral body got out  of alignment from falling on staircase in 1950--chronic on and off pain  since the event. Failed  Tylenol 650mg  qid-changed to prn now and Celebrex 200mg  daily-dc'd 04/09/14 due to inadequate pain control and c/o stomach pain after eats. Prilosec 20mg  daily for GI protection. Persisted chronic pain is managed with Norco bid started 04/04/14-apparent the patient is too sedating while taking bid-will reduce to daily in am. Prn Norco is still available to her.       Fall The patient is lack of safety awareness and doesn't always remember to use call system to request for help. Intensive supervision needed for safety. Medication reduction: dc Celebrex and reduce Norco to daily and Tylenol to prn.    GERD (gastroesophageal reflux disease) C/o pain after eats. Dc Celebrex and continue Prilosec 20mg  daily. Observe the patient.     DEMENTIA rogressing and frequent falling, now in SNF for care. Continue Aricept and Namenda.   Depression with anxiety Stable, takes Mirtazapine 7.5mg  nightly, weights stable.    Hypertension ontrolled on Amlodipine 5mg  and Metoprolol 50mg  bid.    Unspecified constipation MiraLax prn and Colace 100mg  bid-stable.       Family/ Staff Communication: observe the patient  Goals of Care: SNF  Labs/tests ordered: none

## 2014-04-09 NOTE — Assessment & Plan Note (Signed)
c/o  pain in her right shoulder and shoulder blade(no decreased ROM of the right shoulder) and lower back  Hx of middle of her spine vertebral body got out of alignment from falling on staircase in 1950--chronic on and off pain since the event. Failed  Tylenol 650mg  qid-changed to prn now and Celebrex 200mg  daily-dc'd 04/09/14 due to inadequate pain control and c/o stomach pain after eats. Prilosec 20mg  daily for GI protection. Persisted chronic pain is managed with Norco bid started 04/04/14-apparent the patient is too sedating while taking bid-will reduce to daily in am. Prn Norco is still available to her.

## 2014-04-09 NOTE — Assessment & Plan Note (Signed)
MiraLax prn and Colace 100mg bid-stable.     

## 2014-04-09 NOTE — Assessment & Plan Note (Signed)
rogressing and frequent falling, now in SNF for care. Continue Aricept and Namenda.

## 2014-05-07 ENCOUNTER — Non-Acute Institutional Stay (SKILLED_NURSING_FACILITY): Payer: Medicare Other | Admitting: Nurse Practitioner

## 2014-05-07 ENCOUNTER — Encounter: Payer: Self-pay | Admitting: Nurse Practitioner

## 2014-05-07 DIAGNOSIS — F068 Other specified mental disorders due to known physiological condition: Secondary | ICD-10-CM

## 2014-05-07 DIAGNOSIS — F341 Dysthymic disorder: Secondary | ICD-10-CM

## 2014-05-07 DIAGNOSIS — M546 Pain in thoracic spine: Secondary | ICD-10-CM | POA: Diagnosis not present

## 2014-05-07 DIAGNOSIS — I10 Essential (primary) hypertension: Secondary | ICD-10-CM | POA: Diagnosis not present

## 2014-05-07 DIAGNOSIS — F418 Other specified anxiety disorders: Secondary | ICD-10-CM

## 2014-05-07 DIAGNOSIS — K5900 Constipation, unspecified: Secondary | ICD-10-CM

## 2014-05-07 NOTE — Assessment & Plan Note (Signed)
MiraLax prn and Colace 100mg bid-stable.     

## 2014-05-07 NOTE — Assessment & Plan Note (Signed)
c/o  pain in her right shoulder and shoulder blade(no decreased ROM of the right shoulder) and lower back  Hx of middle of her spine vertebral body got out of alignment from falling on staircase in 1950--chronic on and off pain since the event. Chronic pain is managed with Norco bid started 04/04/14. Prn Norco is still available to her.

## 2014-05-07 NOTE — Assessment & Plan Note (Signed)
Controlled on Amlodipine 5mg and Metoprolol 50mg bid.     

## 2014-05-07 NOTE — Progress Notes (Signed)
Patient ID: Jamie [name], female   DOB: Dec 29, 1919, 80 y.o.   MRN: [id number]   Code Status: DNR  Allergies  Allergen Reactions  . Alendronate Sodium     REACTION: GI    Chief Complaint  Patient presents with  . Medical Management of Chronic Issues    HPI: Patient is a 80 y.o. female seen in the SNF at [facility] today for evaluation of chronic medical conditions.  Problem List Items Addressed This Visit   Thoracic back pain - Primary     c/o  pain in her right shoulder and shoulder blade(no decreased ROM of the right shoulder) and lower back  Hx of middle of her spine vertebral body got out of alignment from falling on staircase in 1950--chronic on and off pain since the event. Chronic pain is managed with Norco bid started 04/04/14. Prn Norco is still available to her.         Hypertension     Controlled on Amlodipine 5mg  and Metoprolol 50mg  bid.     Depression with anxiety     Stable, takes Mirtazapine 7.5mg  nightly, weights stable.       DEMENTIA     in SNF for care. Continue Aricept and Namenda.     Constipation     MiraLax prn and Colace 100mg  bid-stable.          Review of Systems:  Review of Systems  Constitutional: Positive for malaise/fatigue. Negative for fever, chills, weight loss and diaphoresis.  HENT: Positive for hearing loss. Negative for congestion, ear pain and sore throat.   Eyes: Negative for pain, discharge and redness.  Respiratory: Negative for cough, sputum production, shortness of breath and wheezing.   Cardiovascular: Negative for chest pain, orthopnea, claudication, leg swelling and PND.  Gastrointestinal: Positive for abdominal pain. Negative for heartburn, nausea, vomiting, diarrhea, constipation and blood in stool.       C/o stomach pain after eats  Genitourinary: Positive for frequency. Negative for dysuria, urgency, hematuria and flank pain.  Musculoskeletal: Positive for back pain, falls and joint pain. Negative for  myalgias and neck pain.       Gait instability. Worsened lower back pain. C/o aches allover-takes Celebrex and Tylenol routinely-asked for prn Tylenol frequently. Staff reported the patient crawled out in hallway on floor 03/22/14 w/o apparent injury.   Skin: Negative for itching and rash.  Neurological: Positive for weakness (generalized. ). Negative for dizziness, tingling, tremors, sensory change, speech change, focal weakness, seizures, loss of consciousness and headaches.  Endo/Heme/Allergies: Negative for environmental allergies and polydipsia. Does not bruise/bleed easily.  Psychiatric/Behavioral: Positive for memory loss. Negative for depression and hallucinations. The patient is not nervous/anxious and does not have insomnia.      Past Medical History  Diagnosis Date  . Osteoporosis   . Allergic rhinitis   . Dementia   . Osteoarthritis     hands  . IBS (irritable bowel syndrome)   . GERD (gastroesophageal reflux disease)   . Hypertension   . Palpitation     a. PAC's & PVC's by Holter - 2011  . Tortuous colon 2002  . Descending thoracic aortic dissection 04/24/2010    a. MRA 2011 - begins distal to left subclavian and extends throughout the full length of the Ao.  Marland Kitchen Aortic insufficiency 04/16/2010    a. Echo 2011 - mild to moderate AI, EF 60%.  . Anemia, unspecified 01/12/2013  . Other abnormal blood chemistry 01/12/2013  . Lumbago 10/06/2012  .  Personal history of fall 10/06/2012  . Hyposmolality and/or hyponatremia 09/29/2012  . Otitis externa 09/29/2012  . Impacted cerumen 09/29/2012  . Unspecified conductive hearing loss 09/29/2012  . External hemorrhoids without mention of complication 09/29/2012  . Unspecified venous (peripheral) insufficiency 09/29/2012  . Unspecified constipation 09/29/2012  . Abnormality of gait 09/29/2012  . Congestive heart failure, unspecified 06/30/2003   Past Surgical History  Procedure Laterality Date  . Dilation and curettage of uterus  1960  .  Cataract extraction w/ intraocular lens implant     Social History:   reports that she has never smoked. She has never used smokeless tobacco. She reports that she does not drink alcohol or use illicit drugs.  Family History  Problem Relation Age of Onset  . Diabetes Mother   . Coronary artery disease Father   . Hypertension Father   . Diabetes      sisters    Medications: Patient's Medications  New Prescriptions   No medications on file  Previous Medications   ACETAMINOPHEN (TYLENOL) 325 MG TABLET    Take 650 mg by mouth every 6 (six) hours as needed.    AMLODIPINE (NORVASC) 5 MG TABLET    Take 1 tablet (5 mg total) by mouth daily.   ARTIFICIAL TEAR SOLUTION (TEARS NATURALE II OP)    Apply 1 drop to eye daily. Both eyes twice daily   AZELASTINE (ASTELIN) 137 MCG/SPRAY NASAL SPRAY    Place 2 sprays into the nose. At bedtime   CALCIUM CITRATE-VITAMIN D (CITRACAL+D) 315-200 MG-UNIT PER TABLET    Take 2 tablets by mouth. Take twice daily   CHOLECALCIFEROL (VITAMIN D) 2000 UNITS CAPS    Take by mouth daily.     DOCUSATE SODIUM (COLACE) 100 MG CAPSULE    Take 100 mg by mouth 2 (two) times daily.   DONEPEZIL (ARICEPT) 10 MG TABLET    Take 1 tablet (10 mg total) by mouth daily.   HYDROCODONE-ACETAMINOPHEN (NORCO/VICODIN) 5-325 MG PER TABLET    Take 1 tablet by mouth every 4 (four) hours as needed for moderate pain. One tablet bid   MEMANTINE (NAMENDA) 10 MG TABLET    Take 28 mg by mouth daily.    METOPROLOL (LOPRESSOR) 50 MG TABLET    Take 1 tablet (50 mg total) by mouth 2 (two) times daily.   MIRTAZAPINE (REMERON) 7.5 MG TABLET    Take 7.5 mg by mouth at bedtime.   MULTIPLE VITAMINS-MINERALS (CENTRUM PO)    Take 1 tablet by mouth 1 day or 1 dose.   OMEPRAZOLE (PRILOSEC) 20 MG CAPSULE    Take 20 mg by mouth daily.   POLYETHYLENE GLYCOL (MIRALAX / GLYCOLAX) PACKET    Take 17 g by mouth. Fill cap to 17 gm mark, mix with 4 oz of fluid and take by mouth every other day.  Modified Medications     No medications on file  Discontinued Medications   No medications on file     Physical Exam: Physical Exam  Constitutional: She appears well-developed and well-nourished. No distress.  HENT:  Head: Normocephalic and atraumatic.  Nose: Nose normal.  Mouth/Throat: Oropharynx is clear and moist. No oropharyngeal exudate.  Eyes: Conjunctivae and EOM are normal. Pupils are equal, round, and reactive to light. Right eye exhibits no discharge. Left eye exhibits no discharge. No scleral icterus.  Neck: Normal range of motion. Neck supple. No JVD present. No thyromegaly present.  Cardiovascular: Normal rate, regular rhythm and normal heart sounds.   No  murmur heard. Pulmonary/Chest: Effort normal and breath sounds normal. No respiratory distress. She has no wheezes. She has no rales.  Abdominal: Soft. Bowel sounds are normal. She exhibits no distension. There is no tenderness.  Musculoskeletal: Normal range of motion. She exhibits tenderness. She exhibits no edema.  Pain in the right shoulder/shoulder blade, neck, middle/lower back-gait instability and frequent falling. C/o aches allover.   Lymphadenopathy:    She has no cervical adenopathy.  Neurological: She is alert. She has normal reflexes. No cranial nerve deficit. She exhibits normal muscle tone. Coordination normal.  Skin: Skin is warm and dry. No rash noted. She is not diaphoretic. No erythema.  Psychiatric: Her mood appears not anxious. Her affect is inappropriate. Her affect is not angry, not blunt and not labile. Her speech is delayed. Her speech is not rapid and/or pressured, not tangential and not slurred. She is slowed. She is not agitated, not aggressive, not hyperactive, not withdrawn and not actively hallucinating. Thought content is not paranoid and not delusional. Cognition and memory are impaired. She expresses impulsivity and inappropriate judgment. She does not exhibit a depressed mood. She is communicative. She exhibits  abnormal recent memory. She exhibits normal remote memory. She is attentive.    Filed Vitals:   05/07/14 1028  BP: 106/88  Pulse: 92  Temp: 98.3 F (36.8 C)  TempSrc: Tympanic  Resp: 18      Labs reviewed: Basic Metabolic Panel:  Recent Labs  16/08/9608/23/14 09/27/13 10/05/13 03/23/14  NA 134* 136*  --  138  K 3.5 3.7  --  4.5  BUN 25* 18  --  33*  CREATININE 0.7 0.7  --  0.8  TSH  --   --  1.12  --     CBC:     Component Value Date/Time   WBC 5.8 03/23/2014   WBC 6.0 05/28/2012 1205   RBC 4.38 05/28/2012 1205   HGB 12.7 03/23/2014   HCT 39 03/23/2014   PLT 123* 03/23/2014   MCV 90.9 05/28/2012 1205   MCH 29.9 05/28/2012 1205   MCHC 32.9 05/28/2012 1205   RDW 12.9 05/28/2012 1205   LYMPHSABS 1.4 05/28/2012 1205   MONOABS 0.6 05/28/2012 1205   EOSABS 0.1 05/28/2012 1205   BASOSABS 0.0 05/28/2012 1205   Assessment/Plan Thoracic back pain c/o  pain in her right shoulder and shoulder blade(no decreased ROM of the right shoulder) and lower back  Hx of middle of her spine vertebral body got out of alignment from falling on staircase in 1950--chronic on and off pain since the event. Chronic pain is managed with Norco bid started 04/04/14. Prn Norco is still available to her.       Hypertension Controlled on Amlodipine 5mg  and Metoprolol 50mg  bid.   Depression with anxiety Stable, takes Mirtazapine 7.5mg  nightly, weights stable.     DEMENTIA in SNF for care. Continue Aricept and Namenda.   Constipation MiraLax prn and Colace 100mg  bid-stable.       Family/ Staff Communication: observe the patient  Goals of Care: SNF  Labs/tests ordered: none

## 2014-05-07 NOTE — Assessment & Plan Note (Signed)
in SNF for care. Continue Aricept and Namenda.

## 2014-05-07 NOTE — Assessment & Plan Note (Signed)
Stable, takes Mirtazapine 7.5mg  nightly, weights stable.

## 2014-05-26 DIAGNOSIS — N390 Urinary tract infection, site not specified: Secondary | ICD-10-CM | POA: Diagnosis not present

## 2014-05-30 ENCOUNTER — Non-Acute Institutional Stay (SKILLED_NURSING_FACILITY): Payer: Medicare Other | Admitting: Nurse Practitioner

## 2014-05-30 ENCOUNTER — Encounter: Payer: Self-pay | Admitting: Nurse Practitioner

## 2014-05-30 DIAGNOSIS — I10 Essential (primary) hypertension: Secondary | ICD-10-CM | POA: Diagnosis not present

## 2014-05-30 DIAGNOSIS — K219 Gastro-esophageal reflux disease without esophagitis: Secondary | ICD-10-CM

## 2014-05-30 DIAGNOSIS — M546 Pain in thoracic spine: Secondary | ICD-10-CM

## 2014-05-30 DIAGNOSIS — F068 Other specified mental disorders due to known physiological condition: Secondary | ICD-10-CM

## 2014-05-30 DIAGNOSIS — N390 Urinary tract infection, site not specified: Secondary | ICD-10-CM | POA: Diagnosis not present

## 2014-05-30 NOTE — Assessment & Plan Note (Signed)
(  Hx of c/o  pain in her right shoulder and shoulder blade(no decreased ROM of the right shoulder) and lower back  Hx of middle of her spine vertebral body got out of alignment from falling on staircase in 1950--chronic on and off pain since the event. Chronic pain is managed with Norco bid started 04/04/14. Prn Norco is still available to her.

## 2014-05-30 NOTE — Assessment & Plan Note (Signed)
C/o pain after eats. Dc Celebrex and continue Prilosec 20mg daily. Observe the patient.    

## 2014-05-30 NOTE — Progress Notes (Signed)
Patient ID: JHADA RISK, female   DOB: [birth year], 80 y.o.   MRN: [id number]   Code Status: DNR  Allergies  Allergen Reactions  . Alendronate Sodium     REACTION: GI    Chief Complaint  Patient presents with  . Medical Management of Chronic Issues  . Acute Visit    UTI    HPI: Patient is a 80 y.o. female seen in the SNF at [facility] today for evaluation of UTI and chronic medical conditions.  Problem List Items Addressed This Visit   DEMENTIA     in SNF for care. Continue Aricept and Namenda.      Thoracic back pain     (Hx of c/o  pain in her right shoulder and shoulder blade(no decreased ROM of the right shoulder) and lower back  Hx of middle of her spine vertebral body got out of alignment from falling on staircase in 1950--chronic on and off pain since the event. Chronic pain is managed with Norco bid started 04/04/14. Prn Norco is still available to her.        Hypertension     Controlled on Amlodipine 5mg  and Metoprolol 50mg  bid.      Urinary tract infection, site not specified - Primary     05/29/14 urine culture E. Coli and S. Coag neg 50,000c/ml, treated with total 7 day course of Septra DS bid.    GERD (gastroesophageal reflux disease)     C/o pain after eats. Dc Celebrex and continue Prilosec 20mg  daily. Observe the patient.          Review of Systems:  Review of Systems  Constitutional: Positive for malaise/fatigue. Negative for fever, chills, weight loss and diaphoresis.  HENT: Positive for hearing loss. Negative for congestion, ear pain and sore throat.   Eyes: Negative for pain, discharge and redness.  Respiratory: Negative for cough, sputum production, shortness of breath and wheezing.   Cardiovascular: Negative for chest pain, orthopnea, claudication, leg swelling and PND.  Gastrointestinal: Positive for abdominal pain. Negative for heartburn, nausea, vomiting, diarrhea, constipation and blood in stool.       Suprapubic discomfort.     Genitourinary: Positive for frequency. Negative for dysuria, urgency, hematuria and flank pain.  Musculoskeletal: Positive for back pain, falls and joint pain. Negative for myalgias and neck pain.       Gait instability. Worsened lower back pain. C/o aches allover-takes Celebrex and Tylenol routinely-asked for prn Tylenol frequently. Staff reported the patient crawled out in hallway on floor 03/22/14 w/o apparent injury.   Skin: Negative for itching and rash.  Neurological: Positive for weakness (generalized. ). Negative for dizziness, tingling, tremors, sensory change, speech change, focal weakness, seizures, loss of consciousness and headaches.  Endo/Heme/Allergies: Negative for environmental allergies and polydipsia. Does not bruise/bleed easily.  Psychiatric/Behavioral: Positive for memory loss. Negative for depression and hallucinations. The patient is not nervous/anxious and does not have insomnia.      Past Medical History  Diagnosis Date  . Osteoporosis   . Allergic rhinitis   . Dementia   . Osteoarthritis     hands  . IBS (irritable bowel syndrome)   . GERD (gastroesophageal reflux disease)   . Hypertension   . Palpitation     a. PAC's & PVC's by Holter - 2011  . Tortuous colon 2002  . Descending thoracic aortic dissection 04/24/2010    a. MRA 2011 - begins distal to left subclavian and extends throughout the full length of the Ao.  Marland Kitchen  Aortic insufficiency 04/16/2010    a. Echo 2011 - mild to moderate AI, EF 60%.  . Anemia, unspecified 01/12/2013  . Other abnormal blood chemistry 01/12/2013  . Lumbago 10/06/2012  . Personal history of fall 10/06/2012  . Hyposmolality and/or hyponatremia 09/29/2012  . Otitis externa 09/29/2012  . Impacted cerumen 09/29/2012  . Unspecified conductive hearing loss 09/29/2012  . External hemorrhoids without mention of complication 09/29/2012  . Unspecified venous (peripheral) insufficiency 09/29/2012  . Unspecified constipation 09/29/2012  .  Abnormality of gait 09/29/2012  . Congestive heart failure, unspecified 06/30/2003   Past Surgical History  Procedure Laterality Date  . Dilation and curettage of uterus  1960  . Cataract extraction w/ intraocular lens implant     Social History:   reports that she has never smoked. She has never used smokeless tobacco. She reports that she does not drink alcohol or use illicit drugs.  Family History  Problem Relation Age of Onset  . Diabetes Mother   . Coronary artery disease Father   . Hypertension Father   . Diabetes      sisters    Medications: Patient's Medications  New Prescriptions   No medications on file  Previous Medications   ACETAMINOPHEN (TYLENOL) 325 MG TABLET    Take 650 mg by mouth every 6 (six) hours as needed.    AMLODIPINE (NORVASC) 5 MG TABLET    Take 1 tablet (5 mg total) by mouth daily.   ARTIFICIAL TEAR SOLUTION (TEARS NATURALE II OP)    Apply 1 drop to eye daily. Both eyes twice daily   AZELASTINE (ASTELIN) 137 MCG/SPRAY NASAL SPRAY    Place 2 sprays into the nose. At bedtime   CALCIUM CITRATE-VITAMIN D (CITRACAL+D) 315-200 MG-UNIT PER TABLET    Take 2 tablets by mouth. Take twice daily   CHOLECALCIFEROL (VITAMIN D) 2000 UNITS CAPS    Take by mouth daily.     DOCUSATE SODIUM (COLACE) 100 MG CAPSULE    Take 100 mg by mouth 2 (two) times daily.   DONEPEZIL (ARICEPT) 10 MG TABLET    Take 1 tablet (10 mg total) by mouth daily.   HYDROCODONE-ACETAMINOPHEN (NORCO/VICODIN) 5-325 MG PER TABLET    Take 1 tablet by mouth every 4 (four) hours as needed for moderate pain. One tablet bid   MEMANTINE (NAMENDA) 10 MG TABLET    Take 28 mg by mouth daily.    METOPROLOL (LOPRESSOR) 50 MG TABLET    Take 1 tablet (50 mg total) by mouth 2 (two) times daily.   MIRTAZAPINE (REMERON) 7.5 MG TABLET    Take 7.5 mg by mouth at bedtime.   MULTIPLE VITAMINS-MINERALS (CENTRUM PO)    Take 1 tablet by mouth 1 day or 1 dose.   NUTRITIONAL SUPPLEMENTS (RESOURCE PO)    Take 120 mLs by mouth 2  (two) times daily. With med pass.   OMEPRAZOLE (PRILOSEC) 20 MG CAPSULE    Take 20 mg by mouth daily.   POLYETHYLENE GLYCOL (MIRALAX / GLYCOLAX) PACKET    Take 17 g by mouth. Fill cap to 17 gm mark, mix with 4 oz of fluid and take by mouth every other day.  Modified Medications   No medications on file  Discontinued Medications   No medications on file     Physical Exam: Physical Exam  Constitutional: She appears well-developed and well-nourished. No distress.  HENT:  Head: Normocephalic and atraumatic.  Nose: Nose normal.  Mouth/Throat: Oropharynx is clear and moist. No oropharyngeal exudate.  Eyes: Conjunctivae and EOM are normal. Pupils are equal, round, and reactive to light. Right eye exhibits no discharge. Left eye exhibits no discharge. No scleral icterus.  Neck: Normal range of motion. Neck supple. No JVD present. No thyromegaly present.  Cardiovascular: Normal rate, regular rhythm and normal heart sounds.   No murmur heard. Pulmonary/Chest: Effort normal and breath sounds normal. No respiratory distress. She has no wheezes. She has no rales.  Abdominal: Soft. Bowel sounds are normal. She exhibits no distension. There is tenderness.  Suprapubic discomfort.   Musculoskeletal: Normal range of motion. She exhibits tenderness. She exhibits no edema.  Pain in the right shoulder/shoulder blade, neck, middle/lower back-gait instability and frequent falling. C/o aches allover.   Lymphadenopathy:    She has no cervical adenopathy.  Neurological: She is alert. She has normal reflexes. No cranial nerve deficit. She exhibits normal muscle tone. Coordination normal.  Skin: Skin is warm and dry. No rash noted. She is not diaphoretic. No erythema.  Stage II pressure ulcer right buttock.   Psychiatric: Her mood appears not anxious. Her affect is inappropriate. Her affect is not angry, not blunt and not labile. Her speech is delayed. Her speech is not rapid and/or pressured, not tangential and  not slurred. She is slowed. She is not agitated, not aggressive, not hyperactive, not withdrawn and not actively hallucinating. Thought content is not paranoid and not delusional. Cognition and memory are impaired. She expresses impulsivity and inappropriate judgment. She does not exhibit a depressed mood. She is communicative. She exhibits abnormal recent memory. She exhibits normal remote memory. She is attentive.    Filed Vitals:   05/30/14 1230  BP: 116/80  Pulse: 72  Temp: 98.2 F (36.8 C)  TempSrc: Tympanic  Resp: 18      Labs reviewed: Basic Metabolic Panel:  Recent Labs  16/10/96 09/27/13 10/05/13 03/23/14  NA 134* 136*  --  138  K 3.5 3.7  --  4.5  BUN 25* 18  --  33*  CREATININE 0.7 0.7  --  0.8  TSH  --   --  1.12  --     CBC:     Component Value Date/Time   WBC 5.8 03/23/2014   WBC 6.0 05/28/2012 1205   RBC 4.38 05/28/2012 1205   HGB 12.7 03/23/2014   HCT 39 03/23/2014   PLT 123* 03/23/2014   MCV 90.9 05/28/2012 1205   MCH 29.9 05/28/2012 1205   MCHC 32.9 05/28/2012 1205   RDW 12.9 05/28/2012 1205   LYMPHSABS 1.4 05/28/2012 1205   MONOABS 0.6 05/28/2012 1205   EOSABS 0.1 05/28/2012 1205   BASOSABS 0.0 05/28/2012 1205   Assessment/Plan Urinary tract infection, site not specified 05/29/14 urine culture E. Coli and S. Coag neg 50,000c/ml, treated with total 7 day course of Septra DS bid.  Hypertension Controlled on Amlodipine 5mg  and Metoprolol 50mg  bid.    GERD (gastroesophageal reflux disease) C/o pain after eats. Dc Celebrex and continue Prilosec 20mg  daily. Observe the patient.     DEMENTIA in SNF for care. Continue Aricept and Namenda.    Thoracic back pain (Hx of c/o  pain in her right shoulder and shoulder blade(no decreased ROM of the right shoulder) and lower back  Hx of middle of her spine vertebral body got out of alignment from falling on staircase in 1950--chronic on and off pain since the event. Chronic pain is managed with Norco bid started 04/04/14. Prn  Norco is still available to her.  Family/ Staff Communication: observe the patient  Goals of Care: SNF  Labs/tests ordered: urine culture done 05/29/14

## 2014-05-30 NOTE — Assessment & Plan Note (Signed)
05/29/14 urine culture E. Coli and S. Coag neg 50,000c/ml, treated with total 7 day course of Septra DS bid.

## 2014-05-30 NOTE — Assessment & Plan Note (Signed)
Controlled on Amlodipine 5mg and Metoprolol 50mg bid.     

## 2014-05-30 NOTE — Assessment & Plan Note (Signed)
in SNF for care. Continue Aricept and Namenda.

## 2014-06-01 DIAGNOSIS — M25559 Pain in unspecified hip: Secondary | ICD-10-CM | POA: Diagnosis not present

## 2014-06-04 ENCOUNTER — Encounter: Payer: Self-pay | Admitting: Nurse Practitioner

## 2014-06-04 DIAGNOSIS — M25552 Pain in left hip: Secondary | ICD-10-CM | POA: Insufficient documentation

## 2014-06-28 ENCOUNTER — Encounter: Payer: Self-pay | Admitting: Nurse Practitioner

## 2014-06-28 ENCOUNTER — Non-Acute Institutional Stay (SKILLED_NURSING_FACILITY): Payer: Medicare Other | Admitting: Nurse Practitioner

## 2014-06-28 DIAGNOSIS — M546 Pain in thoracic spine: Secondary | ICD-10-CM | POA: Diagnosis not present

## 2014-06-28 DIAGNOSIS — I10 Essential (primary) hypertension: Secondary | ICD-10-CM | POA: Diagnosis not present

## 2014-06-28 DIAGNOSIS — F341 Dysthymic disorder: Secondary | ICD-10-CM

## 2014-06-28 DIAGNOSIS — F418 Other specified anxiety disorders: Secondary | ICD-10-CM

## 2014-06-28 DIAGNOSIS — F068 Other specified mental disorders due to known physiological condition: Secondary | ICD-10-CM | POA: Diagnosis not present

## 2014-06-28 DIAGNOSIS — K2100 Gastro-esophageal reflux disease with esophagitis, without bleeding: Secondary | ICD-10-CM | POA: Diagnosis not present

## 2014-06-28 DIAGNOSIS — K5900 Constipation, unspecified: Secondary | ICD-10-CM

## 2014-06-28 NOTE — Assessment & Plan Note (Signed)
(  Hx of c/o  pain in her right shoulder and shoulder blade(no decreased ROM of the right shoulder) and lower back  Hx of middle of her spine vertebral body got out of alignment from falling on staircase in 1950--chronic on and off pain since the event. Chronic pain is managed with Norco bid started 04/04/14. Prn Norco is still available to her.     

## 2014-06-28 NOTE — Assessment & Plan Note (Signed)
Stable, takes Mirtazapine 7.5mg nightly, weights stable.    

## 2014-06-28 NOTE — Assessment & Plan Note (Signed)
MiraLax prn and Colace 100mg bid-stable.     

## 2014-06-28 NOTE — Assessment & Plan Note (Signed)
in SNF for care. Continue Aricept and Namenda.  

## 2014-06-28 NOTE — Assessment & Plan Note (Signed)
Controlled on Amlodipine 5mg and Metoprolol 50mg bid.     

## 2014-06-28 NOTE — Assessment & Plan Note (Signed)
No longer C/o pain after eats. Dc Celebrex and continue Prilosec 20mg  daily. Observe the patient.

## 2014-06-28 NOTE — Progress Notes (Signed)
Patient ID: Jamie [name], female   DOB: 12/22/1919, 80 y.o.   MRN: [id number]   Code Status: DNR  Allergies  Allergen Reactions  . Alendronate Sodium     REACTION: GI    Chief Complaint  Patient presents with  . Medical Management of Chronic Issues    HPI: Patient is a 80 y.o. female [facility] today for evaluation of chronic medical conditions.  Problem List Items Addressed This Visit   DEMENTIA     in SNF for care. Continue Aricept and Namenda.      Constipation     MiraLax prn and Colace 100mg  bid-stable.       Thoracic back pain     (Hx of c/o  pain in her right shoulder and shoulder blade(no decreased ROM of the right shoulder) and lower back  Hx of middle of her spine vertebral body got out of alignment from falling on staircase in 1950--chronic on and off pain since the event. Chronic pain is managed with Norco bid started 04/04/14. Prn Norco is still available to her.       Hypertension - Primary     Controlled on Amlodipine 5mg  and Metoprolol 50mg  bid.       GERD (gastroesophageal reflux disease)     No longer C/o pain after eats. Dc Celebrex and continue Prilosec 20mg  daily. Observe the patient.        Depression with anxiety     Stable, takes Mirtazapine 7.5mg  nightly, weights stable.          Review of Systems:  Review of Systems  Constitutional: Negative for fever, chills, weight loss, malaise/fatigue and diaphoresis.  HENT: Positive for hearing loss. Negative for congestion, ear pain and sore throat.   Eyes: Negative for pain, discharge and redness.  Respiratory: Negative for cough, sputum production, shortness of breath and wheezing.   Cardiovascular: Negative for chest pain, orthopnea, claudication, leg swelling and PND.  Gastrointestinal: Positive for abdominal pain. Negative for heartburn, nausea, vomiting, diarrhea, constipation and blood in stool.       Suprapubic discomfort.   Genitourinary: Positive for  frequency. Negative for dysuria, urgency, hematuria and flank pain.  Musculoskeletal: Positive for back pain, falls and joint pain. Negative for myalgias and neck pain.       Gait instability. Worsened lower back pain. C/o aches allover-takes Celebrex and Tylenol routinely-asked for prn Tylenol frequently. Staff reported the patient crawled out in hallway on floor 03/22/14 w/o apparent injury.   Skin: Negative for itching and rash.  Neurological: Positive for weakness (generalized. ). Negative for dizziness, tingling, tremors, sensory change, speech change, focal weakness, seizures, loss of consciousness and headaches.  Endo/Heme/Allergies: Negative for environmental allergies and polydipsia. Does not bruise/bleed easily.  Psychiatric/Behavioral: Positive for memory loss. Negative for depression and hallucinations. The patient is not nervous/anxious and does not have insomnia.      Past Medical History  Diagnosis Date  . Osteoporosis   . Allergic rhinitis   . Dementia   . Osteoarthritis     hands  . IBS (irritable bowel syndrome)   . GERD (gastroesophageal reflux disease)   . Hypertension   . Palpitation     a. PAC's & PVC's by Holter - 2011  . Tortuous colon 2002  . Descending thoracic aortic dissection 04/24/2010    a. MRA 2011 - begins distal to left subclavian and extends throughout the full length of the Ao.  Marland Kitchen. Aortic insufficiency 04/16/2010  a. Echo 2011 - mild to moderate AI, EF 60%.  . Anemia, unspecified 01/12/2013  . Other abnormal blood chemistry 01/12/2013  . Lumbago 10/06/2012  . Personal history of fall 10/06/2012  . Hyposmolality and/or hyponatremia 09/29/2012  . Otitis externa 09/29/2012  . Impacted cerumen 09/29/2012  . Unspecified conductive hearing loss 09/29/2012  . External hemorrhoids without mention of complication 09/29/2012  . Unspecified venous (peripheral) insufficiency 09/29/2012  . Unspecified constipation 09/29/2012  . Abnormality of gait 09/29/2012  .  Congestive heart failure, unspecified 06/30/2003   Past Surgical History  Procedure Laterality Date  . Dilation and curettage of uterus  1960  . Cataract extraction w/ intraocular lens implant     Social History:   reports that she has never smoked. She has never used smokeless tobacco. She reports that she does not drink alcohol or use illicit drugs.  Family History  Problem Relation Age of Onset  . Diabetes Mother   . Coronary artery disease Father   . Hypertension Father   . Diabetes      sisters    Medications: Patient's Medications  New Prescriptions   No medications on file  Previous Medications   ACETAMINOPHEN (TYLENOL) 325 MG TABLET    Take 650 mg by mouth every 6 (six) hours as needed.    AMLODIPINE (NORVASC) 5 MG TABLET    Take 1 tablet (5 mg total) by mouth daily.   ARTIFICIAL TEAR SOLUTION (TEARS NATURALE II OP)    Apply 1 drop to eye daily. Both eyes twice daily   AZELASTINE (ASTELIN) 137 MCG/SPRAY NASAL SPRAY    Place 2 sprays into the nose. At bedtime   CALCIUM CITRATE-VITAMIN D (CITRACAL+D) 315-200 MG-UNIT PER TABLET    Take 2 tablets by mouth. Take twice daily   CHOLECALCIFEROL (VITAMIN D) 2000 UNITS CAPS    Take by mouth daily.     DOCUSATE SODIUM (COLACE) 100 MG CAPSULE    Take 100 mg by mouth 2 (two) times daily.   DONEPEZIL (ARICEPT) 10 MG TABLET    Take 1 tablet (10 mg total) by mouth daily.   HYDROCODONE-ACETAMINOPHEN (NORCO/VICODIN) 5-325 MG PER TABLET    Take 1 tablet by mouth every 4 (four) hours as needed for moderate pain. One tablet bid   MEMANTINE (NAMENDA) 10 MG TABLET    Take 28 mg by mouth daily.    METOPROLOL (LOPRESSOR) 50 MG TABLET    Take 1 tablet (50 mg total) by mouth 2 (two) times daily.   MIRTAZAPINE (REMERON) 7.5 MG TABLET    Take 7.5 mg by mouth at bedtime.   MULTIPLE VITAMINS-MINERALS (CENTRUM PO)    Take 1 tablet by mouth 1 day or 1 dose.   NUTRITIONAL SUPPLEMENTS (RESOURCE PO)    Take 120 mLs by mouth 2 (two) times daily. With med pass.    OMEPRAZOLE (PRILOSEC) 20 MG CAPSULE    Take 20 mg by mouth daily.   POLYETHYLENE GLYCOL (MIRALAX / GLYCOLAX) PACKET    Take 17 g by mouth. Fill cap to 17 gm mark, mix with 4 oz of fluid and take by mouth every other day.  Modified Medications   No medications on file  Discontinued Medications   No medications on file     Physical Exam: Physical Exam  Constitutional: She appears well-developed and well-nourished. No distress.  HENT:  Head: Normocephalic and atraumatic.  Nose: Nose normal.  Mouth/Throat: Oropharynx is clear and moist. No oropharyngeal exudate.  Eyes: Conjunctivae and EOM are normal.  Pupils are equal, round, and reactive to light. Right eye exhibits no discharge. Left eye exhibits no discharge. No scleral icterus.  Neck: Normal range of motion. Neck supple. No JVD present. No thyromegaly present.  Cardiovascular: Normal rate, regular rhythm and normal heart sounds.   No murmur heard. Pulmonary/Chest: Effort normal and breath sounds normal. No respiratory distress. She has no wheezes. She has no rales.  Abdominal: Soft. Bowel sounds are normal. She exhibits no distension. There is tenderness.  Suprapubic discomfort.   Musculoskeletal: Normal range of motion. She exhibits tenderness. She exhibits no edema.  Pain in the right shoulder/shoulder blade, neck, middle/lower back-gait instability and frequent falling. C/o aches allover.   Lymphadenopathy:    She has no cervical adenopathy.  Neurological: She is alert. She has normal reflexes. No cranial nerve deficit. She exhibits normal muscle tone. Coordination normal.  Skin: Skin is warm and dry. No rash noted. She is not diaphoretic. No erythema.  Stage II pressure ulcer right buttock.   Psychiatric: Her mood appears not anxious. Her affect is inappropriate. Her affect is not angry, not blunt and not labile. Her speech is delayed. Her speech is not rapid and/or pressured, not tangential and not slurred. She is slowed. She is  not agitated, not aggressive, not hyperactive, not withdrawn and not actively hallucinating. Thought content is not paranoid and not delusional. Cognition and memory are impaired. She expresses impulsivity and inappropriate judgment. She does not exhibit a depressed mood. She is communicative. She exhibits abnormal recent memory. She exhibits normal remote memory. She is attentive.    Filed Vitals:   06/28/14 1637  BP: 118/72  Pulse: 64  Temp: 97.9 F (36.6 C)  TempSrc: Tympanic  Resp: 16      Labs reviewed: Basic Metabolic Panel:  Recent Labs  16/10/96 09/27/13 10/05/13 03/23/14  NA 134* 136*  --  138  K 3.5 3.7  --  4.5  BUN 25* 18  --  33*  CREATININE 0.7 0.7  --  0.8  TSH  --   --  1.12  --     CBC:     Component Value Date/Time   WBC 5.8 03/23/2014   WBC 6.0 05/28/2012 1205   RBC 4.38 05/28/2012 1205   HGB 12.7 03/23/2014   HCT 39 03/23/2014   PLT 123* 03/23/2014   MCV 90.9 05/28/2012 1205   MCH 29.9 05/28/2012 1205   MCHC 32.9 05/28/2012 1205   RDW 12.9 05/28/2012 1205   LYMPHSABS 1.4 05/28/2012 1205   MONOABS 0.6 05/28/2012 1205   EOSABS 0.1 05/28/2012 1205   BASOSABS 0.0 05/28/2012 1205   Assessment/Plan Hypertension Controlled on Amlodipine 5mg  and Metoprolol 50mg  bid.     Thoracic back pain (Hx of c/o  pain in her right shoulder and shoulder blade(no decreased ROM of the right shoulder) and lower back  Hx of middle of her spine vertebral body got out of alignment from falling on staircase in 1950--chronic on and off pain since the event. Chronic pain is managed with Norco bid started 04/04/14. Prn Norco is still available to her.     DEMENTIA in SNF for care. Continue Aricept and Namenda.    GERD (gastroesophageal reflux disease) No longer C/o pain after eats. Dc Celebrex and continue Prilosec 20mg  daily. Observe the patient.      Depression with anxiety Stable, takes Mirtazapine 7.5mg  nightly, weights stable.     Constipation MiraLax prn and Colace 100mg   bid-stable.       Family/  Staff Communication: observe the patient  Goals of Care: SNF  Labs/tests ordered: none

## 2014-07-02 ENCOUNTER — Non-Acute Institutional Stay (SKILLED_NURSING_FACILITY): Payer: Medicare Other | Admitting: Nurse Practitioner

## 2014-07-02 ENCOUNTER — Encounter: Payer: Self-pay | Admitting: Nurse Practitioner

## 2014-07-02 DIAGNOSIS — N63 Unspecified lump in unspecified breast: Secondary | ICD-10-CM | POA: Diagnosis not present

## 2014-07-02 DIAGNOSIS — I7101 Dissection of thoracic aorta: Secondary | ICD-10-CM

## 2014-07-02 DIAGNOSIS — F341 Dysthymic disorder: Secondary | ICD-10-CM

## 2014-07-02 DIAGNOSIS — I71019 Dissection of thoracic aorta, unspecified: Secondary | ICD-10-CM

## 2014-07-02 DIAGNOSIS — N6324 Unspecified lump in the left breast, lower inner quadrant: Secondary | ICD-10-CM

## 2014-07-02 DIAGNOSIS — F418 Other specified anxiety disorders: Secondary | ICD-10-CM

## 2014-07-02 DIAGNOSIS — I71012 Dissection of descending thoracic aorta: Secondary | ICD-10-CM

## 2014-07-02 DIAGNOSIS — I10 Essential (primary) hypertension: Secondary | ICD-10-CM | POA: Diagnosis not present

## 2014-07-02 DIAGNOSIS — K219 Gastro-esophageal reflux disease without esophagitis: Secondary | ICD-10-CM

## 2014-07-02 DIAGNOSIS — F068 Other specified mental disorders due to known physiological condition: Secondary | ICD-10-CM

## 2014-07-02 DIAGNOSIS — K5900 Constipation, unspecified: Secondary | ICD-10-CM

## 2014-07-02 DIAGNOSIS — M546 Pain in thoracic spine: Secondary | ICD-10-CM

## 2014-07-02 HISTORY — DX: Unspecified lump in the left breast, lower inner quadrant: N63.24

## 2014-07-02 NOTE — Assessment & Plan Note (Signed)
Controlled on Amlodipine 5mg and Metoprolol 50mg bid.     

## 2014-07-02 NOTE — Assessment & Plan Note (Signed)
MiraLax prn and Colace 100mg bid-stable.     

## 2014-07-02 NOTE — Progress Notes (Signed)
Patient ID: Jamie [name], female   DOB: 08/08/1920, 80 y.o.   MRN: [id number]   Code Status: DNR  Allergies  Allergen Reactions  . Alendronate Sodium     REACTION: GI    Chief Complaint  Patient presents with  . Medical Management of Chronic Issues  . Acute Visit    left breast lump, c/o black stools/stomach aches.     HPI: Patient is a 80 y.o. female seen in the SNF [facility] today for evaluation of left breast lump, black stools, stomachaches when eats too much, chronic medical conditions.  Problem List Items Addressed This Visit   DEMENTIA     In SNF for care. Continue Aricept and Namenda.       Constipation     MiraLax prn and Colace 100mg  bid-stable.      Thoracic back pain     Hx of c/o  pain in her right shoulder and shoulder blade(no decreased ROM of the right shoulder) and lower back  Hx of middle of her spine vertebral body got out of alignment from falling on staircase in 1950--chronic on and off pain since the event. Chronic pain is managed with Norco bid started 04/04/14. Prn Norco is still available to her.       Descending thoracic aortic dissection     C/o back pain between shoulder blades and stomachaches if eats too much. May consider MRA to re-evaluate the  Thoracic aortic dissection.     Hypertension     Controlled on Amlodipine 5mg  and Metoprolol 50mg  bid.       GERD (gastroesophageal reflux disease)     C/o stomachaches when eats too much. off Celebrex and continue Prilosec 20mg  daily. Also the patient stated she is having black stools. Will check CBC, BMP, Guaiac stoolsx3 to r/o GI bleed.        Depression with anxiety     Stable, takes Mirtazapine 7.5mg  nightly, weights stable.        Breast lump on left side at 7 o'clock position - Primary     Mobile, smooth, a large marble size, non-tender at left breast 7 o'clock-will schedule Mammogram to evaluate further.        Review of Systems:  Review of Systems    Constitutional: Negative for fever, chills, weight loss, malaise/fatigue and diaphoresis.  HENT: Positive for hearing loss. Negative for congestion, ear pain and sore throat.   Eyes: Negative for pain, discharge and redness.  Respiratory: Negative for cough, sputum production, shortness of breath and wheezing.   Cardiovascular: Negative for chest pain, orthopnea, claudication, leg swelling and PND.  Gastrointestinal: Positive for abdominal pain. Negative for heartburn, nausea, vomiting, diarrhea, constipation and blood in stool.       C/o stomachaches when eats too much and having black stools.   Genitourinary: Positive for frequency. Negative for dysuria, urgency, hematuria and flank pain.  Musculoskeletal: Positive for back pain, falls and joint pain. Negative for myalgias and neck pain.       Gait instability. Worsened lower back pain. C/o aches allover-takes Celebrex and Tylenol routinely-asked for prn Tylenol frequently. Staff reported the patient crawled out in hallway on floor 03/22/14 w/o apparent injury.   Skin: Negative for itching and rash.       Mobile, smooth, a large marble size, non-tender at left breast 7 o'clock  Neurological: Positive for weakness (generalized. ). Negative for dizziness, tingling, tremors, sensory change, speech change, focal weakness, seizures, loss of consciousness and headaches.  Endo/Heme/Allergies: Negative for environmental allergies and polydipsia. Does not bruise/bleed easily.  Psychiatric/Behavioral: Positive for memory loss. Negative for depression and hallucinations. The patient is not nervous/anxious and does not have insomnia.      Past Medical History  Diagnosis Date  . Osteoporosis   . Allergic rhinitis   . Dementia   . Osteoarthritis     hands  . IBS (irritable bowel syndrome)   . GERD (gastroesophageal reflux disease)   . Hypertension   . Palpitation     a. PAC's & PVC's by Holter - 2011  . Tortuous colon 2002  . Descending thoracic  aortic dissection 04/24/2010    a. MRA 2011 - begins distal to left subclavian and extends throughout the full length of the Ao.  Marland Kitchen Aortic insufficiency 04/16/2010    a. Echo 2011 - mild to moderate AI, EF 60%.  . Anemia, unspecified 01/12/2013  . Other abnormal blood chemistry 01/12/2013  . Lumbago 10/06/2012  . Personal history of fall 10/06/2012  . Hyposmolality and/or hyponatremia 09/29/2012  . Otitis externa 09/29/2012  . Impacted cerumen 09/29/2012  . Unspecified conductive hearing loss 09/29/2012  . External hemorrhoids without mention of complication 09/29/2012  . Unspecified venous (peripheral) insufficiency 09/29/2012  . Unspecified constipation 09/29/2012  . Abnormality of gait 09/29/2012  . Congestive heart failure, unspecified 06/30/2003   Past Surgical History  Procedure Laterality Date  . Dilation and curettage of uterus  1960  . Cataract extraction w/ intraocular lens implant     Social History:   reports that she has never smoked. She has never used smokeless tobacco. She reports that she does not drink alcohol or use illicit drugs.  Family History  Problem Relation Age of Onset  . Diabetes Mother   . Coronary artery disease Father   . Hypertension Father   . Diabetes      sisters    Medications: Patient's Medications  New Prescriptions   No medications on file  Previous Medications   ACETAMINOPHEN (TYLENOL) 325 MG TABLET    Take 650 mg by mouth every 6 (six) hours as needed.    AMLODIPINE (NORVASC) 5 MG TABLET    Take 1 tablet (5 mg total) by mouth daily.   ARTIFICIAL TEAR SOLUTION (TEARS NATURALE II OP)    Apply 1 drop to eye daily. Both eyes twice daily   AZELASTINE (ASTELIN) 137 MCG/SPRAY NASAL SPRAY    Place 2 sprays into the nose. At bedtime   CALCIUM CITRATE-VITAMIN D (CITRACAL+D) 315-200 MG-UNIT PER TABLET    Take 2 tablets by mouth. Take twice daily   CHOLECALCIFEROL (VITAMIN D) 2000 UNITS CAPS    Take by mouth daily.     DOCUSATE SODIUM (COLACE) 100 MG  CAPSULE    Take 100 mg by mouth 2 (two) times daily.   DONEPEZIL (ARICEPT) 10 MG TABLET    Take 1 tablet (10 mg total) by mouth daily.   HYDROCODONE-ACETAMINOPHEN (NORCO/VICODIN) 5-325 MG PER TABLET    Take 1 tablet by mouth every 4 (four) hours as needed for moderate pain. One tablet bid   MEMANTINE (NAMENDA) 10 MG TABLET    Take 28 mg by mouth daily.    METOPROLOL (LOPRESSOR) 50 MG TABLET    Take 1 tablet (50 mg total) by mouth 2 (two) times daily.   MIRTAZAPINE (REMERON) 7.5 MG TABLET    Take 7.5 mg by mouth at bedtime.   MULTIPLE VITAMINS-MINERALS (CENTRUM PO)    Take 1 tablet by mouth 1 day  or 1 dose.   NUTRITIONAL SUPPLEMENTS (RESOURCE PO)    Take 120 mLs by mouth 2 (two) times daily. With med pass.   OMEPRAZOLE (PRILOSEC) 20 MG CAPSULE    Take 20 mg by mouth daily.   POLYETHYLENE GLYCOL (MIRALAX / GLYCOLAX) PACKET    Take 17 g by mouth. Fill cap to 17 gm mark, mix with 4 oz of fluid and take by mouth every other day.  Modified Medications   No medications on file  Discontinued Medications   No medications on file     Physical Exam: Physical Exam  Constitutional: She appears well-developed and well-nourished. No distress.  HENT:  Head: Normocephalic and atraumatic.  Nose: Nose normal.  Mouth/Throat: Oropharynx is clear and moist. No oropharyngeal exudate.  Eyes: Conjunctivae and EOM are normal. Pupils are equal, round, and reactive to light. Right eye exhibits no discharge. Left eye exhibits no discharge. No scleral icterus.  Neck: Normal range of motion. Neck supple. No JVD present. No thyromegaly present.  Cardiovascular: Normal rate, regular rhythm and normal heart sounds.   No murmur heard. Pulmonary/Chest: Effort normal and breath sounds normal. No respiratory distress. She has no wheezes. She has no rales.  Abdominal: Soft. Bowel sounds are normal. She exhibits no distension. There is tenderness.  C/o stomachaches when eats too much and having black stools.      Musculoskeletal: Normal range of motion. She exhibits tenderness. She exhibits no edema.  Pain in the right shoulder/shoulder blade, neck, middle/lower back-gait instability and frequent falling. C/o aches allover.   Lymphadenopathy:    She has no cervical adenopathy.  Neurological: She is alert. She has normal reflexes. No cranial nerve deficit. She exhibits normal muscle tone. Coordination normal.  Skin: Skin is warm and dry. No rash noted. She is not diaphoretic. No erythema.  Stage II pressure ulcer right buttock. Mobile, smooth, a large marble size, non-tender at left breast 7 o'clock  Psychiatric: Her mood appears not anxious. Her affect is inappropriate. Her affect is not angry, not blunt and not labile. Her speech is delayed. Her speech is not rapid and/or pressured, not tangential and not slurred. She is slowed. She is not agitated, not aggressive, not hyperactive, not withdrawn and not actively hallucinating. Thought content is not paranoid and not delusional. Cognition and memory are impaired. She expresses impulsivity and inappropriate judgment. She does not exhibit a depressed mood. She is communicative. She exhibits abnormal recent memory. She exhibits normal remote memory. She is attentive.    Filed Vitals:   07/02/14 1432  BP: 118/72  Pulse: 64  Temp: 97.9 F (36.6 C)  TempSrc: Tympanic  Resp: 16      Labs reviewed: Basic Metabolic Panel:  Recent Labs  16/10/96 09/27/13 10/05/13 03/23/14  NA 134* 136*  --  138  K 3.5 3.7  --  4.5  BUN 25* 18  --  33*  CREATININE 0.7 0.7  --  0.8  TSH  --   --  1.12  --     CBC:     Component Value Date/Time   WBC 5.8 03/23/2014   WBC 6.0 05/28/2012 1205   RBC 4.38 05/28/2012 1205   HGB 12.7 03/23/2014   HCT 39 03/23/2014   PLT 123* 03/23/2014   MCV 90.9 05/28/2012 1205   MCH 29.9 05/28/2012 1205   MCHC 32.9 05/28/2012 1205   RDW 12.9 05/28/2012 1205   LYMPHSABS 1.4 05/28/2012 1205   MONOABS 0.6 05/28/2012 1205   EOSABS 0.1 05/28/2012  1205    BASOSABS 0.0 05/28/2012 1205   Assessment/Plan Breast lump on left side at 7 o'clock position Mobile, smooth, a large marble size, non-tender at left breast 7 o'clock-will schedule Mammogram to evaluate further.   GERD (gastroesophageal reflux disease) C/o stomachaches when eats too much. off Celebrex and continue Prilosec 20mg  daily. Also the patient stated she is having black stools. Will check CBC, BMP, Guaiac stoolsx3 to r/o GI bleed.      Hypertension Controlled on Amlodipine 5mg  and Metoprolol 50mg  bid.     Depression with anxiety Stable, takes Mirtazapine 7.5mg  nightly, weights stable.      Thoracic back pain Hx of c/o  pain in her right shoulder and shoulder blade(no decreased ROM of the right shoulder) and lower back  Hx of middle of her spine vertebral body got out of alignment from falling on staircase in 1950--chronic on and off pain since the event. Chronic pain is managed with Norco bid started 04/04/14. Prn Norco is still available to her.     DEMENTIA In SNF for care. Continue Aricept and Namenda.     Constipation MiraLax prn and Colace 100mg  bid-stable.    Descending thoracic aortic dissection C/o back pain between shoulder blades and stomachaches if eats too much. May consider MRA to re-evaluate the  Thoracic aortic dissection.     Family/ Staff Communication: observe the patient  Goals of Care: SNF  Labs/tests ordered: Mammogram, CBC, BMP, Guaiac stool x3. MRA for hx aortic dissected aneurysm.

## 2014-07-02 NOTE — Assessment & Plan Note (Signed)
C/o stomachaches when eats too much. off Celebrex and continue Prilosec 20mg  daily. Also the patient stated she is having black stools. Will check CBC, BMP, Guaiac stoolsx3 to r/o GI bleed.

## 2014-07-02 NOTE — Assessment & Plan Note (Signed)
(

## 2014-07-02 NOTE — Assessment & Plan Note (Signed)
In SNF for care. Continue Aricept and Namenda.    

## 2014-07-02 NOTE — Assessment & Plan Note (Signed)
Stable, takes Mirtazapine 7.5mg nightly, weights stable.    

## 2014-07-02 NOTE — Assessment & Plan Note (Signed)
C/o back pain between shoulder blades and stomachaches if eats too much. May consider MRA to re-evaluate the  Thoracic aortic dissection.

## 2014-07-02 NOTE — Assessment & Plan Note (Signed)
Mobile, smooth, a large marble size, non-tender at left breast 7 o'clock-will schedule Mammogram to evaluate further.

## 2014-07-03 DIAGNOSIS — D649 Anemia, unspecified: Secondary | ICD-10-CM | POA: Diagnosis not present

## 2014-07-03 LAB — CBC AND DIFFERENTIAL
HEMATOCRIT: 37 % (ref 36–46)
Hemoglobin: 12 g/dL (ref 12.0–16.0)
PLATELETS: 112 10*3/uL — AB (ref 150–399)
WBC: 5.7 10*3/mL

## 2014-07-03 LAB — BASIC METABOLIC PANEL
BUN: 26 mg/dL — AB (ref 4–21)
Creatinine: 0.7 mg/dL (ref 0.5–1.1)
Glucose: 78 mg/dL
Potassium: 3.7 mmol/L (ref 3.4–5.3)
Sodium: 144 mmol/L (ref 137–147)

## 2014-07-04 ENCOUNTER — Other Ambulatory Visit: Payer: Self-pay | Admitting: Nurse Practitioner

## 2014-07-09 ENCOUNTER — Encounter: Payer: Self-pay | Admitting: Nurse Practitioner

## 2014-07-26 ENCOUNTER — Encounter: Payer: Self-pay | Admitting: Nurse Practitioner

## 2014-07-26 ENCOUNTER — Non-Acute Institutional Stay (SKILLED_NURSING_FACILITY): Payer: Medicare Other | Admitting: Nurse Practitioner

## 2014-07-26 DIAGNOSIS — I71019 Dissection of thoracic aorta, unspecified: Secondary | ICD-10-CM | POA: Diagnosis not present

## 2014-07-26 DIAGNOSIS — K219 Gastro-esophageal reflux disease without esophagitis: Secondary | ICD-10-CM

## 2014-07-26 DIAGNOSIS — F341 Dysthymic disorder: Secondary | ICD-10-CM

## 2014-07-26 DIAGNOSIS — F418 Other specified anxiety disorders: Secondary | ICD-10-CM

## 2014-07-26 DIAGNOSIS — M546 Pain in thoracic spine: Secondary | ICD-10-CM | POA: Diagnosis not present

## 2014-07-26 DIAGNOSIS — N63 Unspecified lump in unspecified breast: Secondary | ICD-10-CM

## 2014-07-26 DIAGNOSIS — N6324 Unspecified lump in the left breast, lower inner quadrant: Secondary | ICD-10-CM

## 2014-07-26 DIAGNOSIS — I71012 Dissection of descending thoracic aorta: Secondary | ICD-10-CM

## 2014-07-26 DIAGNOSIS — F068 Other specified mental disorders due to known physiological condition: Secondary | ICD-10-CM

## 2014-07-26 DIAGNOSIS — I7101 Dissection of thoracic aorta: Secondary | ICD-10-CM | POA: Diagnosis not present

## 2014-07-26 DIAGNOSIS — K5900 Constipation, unspecified: Secondary | ICD-10-CM | POA: Diagnosis not present

## 2014-07-26 DIAGNOSIS — I10 Essential (primary) hypertension: Secondary | ICD-10-CM

## 2014-07-26 NOTE — Assessment & Plan Note (Signed)
C/o stomachaches when eats too much. off Celebrex and continue Prilosec  daily.

## 2014-07-26 NOTE — Assessment & Plan Note (Signed)
(

## 2014-07-26 NOTE — Assessment & Plan Note (Signed)
Stable, takes Mirtazapine 7.5mg nightly, weights stable.    

## 2014-07-26 NOTE — Assessment & Plan Note (Signed)
In SNF for care. Continue Aricept and Namenda.    

## 2014-07-26 NOTE — Assessment & Plan Note (Signed)
Mobile, smooth, a large marble size, non-tender at left breast 7 o'clock-will schedule Mammogram to evaluate further.  07/05/14 POA declined Mammogram. Desires no IVF. ABT for comfort measures only.   

## 2014-07-26 NOTE — Assessment & Plan Note (Signed)
C/o back pain between shoulder blades and stomachaches if eats too much. May consider MRA to re-evaluate the  Thoracic aortic dissection.

## 2014-07-26 NOTE — Assessment & Plan Note (Signed)
Controlled on Amlodipine 5mg and Metoprolol 50mg bid.     

## 2014-07-26 NOTE — Progress Notes (Signed)
Patient ID: Jamie [name], female   DOB: Nov 13, 1920, 80 y.o.   MRN: [id number]   Code Status: DNR  Allergies  Allergen Reactions  . Alendronate Sodium     REACTION: GI    Chief Complaint  Patient presents with  . Medical Management of Chronic Issues    HPI: Patient is a 80 y.o. female seen in the SNF at [facility] today for evaluation of chronic medical conditions.  Problem List Items Addressed This Visit   DEMENTIA - Primary     In SNF for care. Continue Aricept and Namenda.      Constipation     MiraLax prn and Colace  bid-stable.       Thoracic back pain     Hx of c/o  pain in her right shoulder and shoulder blade(no decreased ROM of the right shoulder) and lower back  Hx of middle of her spine vertebral body got out of alignment from falling on staircase in 1950--chronic on and off pain since the event. Chronic pain is managed with Norco bid started 04/04/14. Prn Norco is still available to her.        Descending thoracic aortic dissection     C/o back pain between shoulder blades and stomachaches if eats too much. May consider MRA to re-evaluate the  Thoracic aortic dissection.     Hypertension     Controlled on Amlodipine  and Metoprolol  bid.        GERD (gastroesophageal reflux disease)     C/o stomachaches when eats too much. off Celebrex and continue Prilosec  daily.         Depression with anxiety     Stable, takes Mirtazapine 7.5mg  nightly, weights stable.      Breast lump on left side at 7 o'clock position     Mobile, smooth, a large marble size, non-tender at left breast 7 o'clock-will schedule Mammogram to evaluate further.  07/05/14 POA declined Mammogram. Desires no IVF. ABT for comfort measures only.         Review of Systems:  Review of Systems  Constitutional: Negative for fever, chills, weight loss, malaise/fatigue and diaphoresis.  HENT: Positive for hearing loss. Negative for congestion, ear pain and sore  throat.   Eyes: Negative for pain, discharge and redness.  Respiratory: Negative for cough, sputum production, shortness of breath and wheezing.   Cardiovascular: Negative for chest pain, orthopnea, claudication, leg swelling and PND.  Gastrointestinal: Positive for abdominal pain. Negative for heartburn, nausea, vomiting, diarrhea, constipation and blood in stool.       C/o stomachaches when eats too much and having black stools.   Genitourinary: Positive for frequency. Negative for dysuria, urgency, hematuria and flank pain.  Musculoskeletal: Positive for back pain, falls and joint pain. Negative for myalgias and neck pain.       Gait instability. Worsened lower back pain. C/o aches allover-takes Celebrex and Tylenol routinely-asked for prn Tylenol frequently. Staff reported the patient crawled out in hallway on floor 03/22/14 w/o apparent injury.   Skin: Negative for itching and rash.       Mobile, smooth, a large marble size, non-tender at left breast 7 o'clock  Neurological: Positive for weakness (generalized. ). Negative for dizziness, tingling, tremors, sensory change, speech change, focal weakness, seizures, loss of consciousness and headaches.  Endo/Heme/Allergies: Negative for environmental allergies and polydipsia. Does not bruise/bleed easily.  Psychiatric/Behavioral: Positive for memory loss. Negative for depression and hallucinations. The patient is not nervous/anxious and does  not have insomnia.      Past Medical History  Diagnosis Date  . Osteoporosis   . Allergic rhinitis   . Dementia   . Osteoarthritis     hands  . IBS (irritable bowel syndrome)   . GERD (gastroesophageal reflux disease)   . Hypertension   . Palpitation     a. PAC's & PVC's by Holter - 2011  . Tortuous colon 2002  . Descending thoracic aortic dissection 04/24/2010    a. MRA 2011 - begins distal to left subclavian and extends throughout the full length of the Ao.  Marland Kitchen Aortic insufficiency 04/16/2010    a.  Echo 2011 - mild to moderate AI, EF 60%.  . Anemia, unspecified 01/12/2013  . Other abnormal blood chemistry 01/12/2013  . Lumbago 10/06/2012  . Personal history of fall 10/06/2012  . Hyposmolality and/or hyponatremia 09/29/2012  . Otitis externa 09/29/2012  . Impacted cerumen 09/29/2012  . Unspecified conductive hearing loss 09/29/2012  . External hemorrhoids without mention of complication 09/29/2012  . Unspecified venous (peripheral) insufficiency 09/29/2012  . Unspecified constipation 09/29/2012  . Abnormality of gait 09/29/2012  . Congestive heart failure, unspecified 06/30/2003   Past Surgical History  Procedure Laterality Date  . Dilation and curettage of uterus  1960  . Cataract extraction w/ intraocular lens implant     Social History:   reports that she has never smoked. She has never used smokeless tobacco. She reports that she does not drink alcohol or use illicit drugs.  Family History  Problem Relation Age of Onset  . Diabetes Mother   . Coronary artery disease Father   . Hypertension Father   . Diabetes      sisters    Medications: Patient's Medications  New Prescriptions   No medications on file  Previous Medications   ACETAMINOPHEN (TYLENOL) 325 MG TABLET    Take 650 mg by mouth every 6 (six) hours as needed.    AMLODIPINE (NORVASC) 5 MG TABLET    Take 1 tablet (5 mg total) by mouth daily.   ARTIFICIAL TEAR SOLUTION (TEARS NATURALE II OP)    Apply 1 drop to eye daily. Both eyes twice daily   AZELASTINE (ASTELIN) 137 MCG/SPRAY NASAL SPRAY    Place 2 sprays into the nose. At bedtime   CALCIUM CITRATE-VITAMIN D (CITRACAL+D) 315-200 MG-UNIT PER TABLET    Take 2 tablets by mouth. Take twice daily   CHOLECALCIFEROL (VITAMIN D) 2000 UNITS CAPS    Take by mouth daily.     DOCUSATE SODIUM (COLACE) 100 MG CAPSULE    Take 100 mg by mouth 2 (two) times daily.   DONEPEZIL (ARICEPT) 10 MG TABLET    Take 1 tablet (10 mg total) by mouth daily.   HYDROCODONE-ACETAMINOPHEN  (NORCO/VICODIN) 5-325 MG PER TABLET    Take 1 tablet by mouth every 4 (four) hours as needed for moderate pain. One tablet bid   MEMANTINE (NAMENDA) 10 MG TABLET    Take 28 mg by mouth daily.    METOPROLOL (LOPRESSOR) 50 MG TABLET    Take 1 tablet (50 mg total) by mouth 2 (two) times daily.   MIRTAZAPINE (REMERON) 7.5 MG TABLET    Take 7.5 mg by mouth at bedtime.   MULTIPLE VITAMINS-MINERALS (CENTRUM PO)    Take 1 tablet by mouth 1 day or 1 dose.   NUTRITIONAL SUPPLEMENTS (RESOURCE PO)    Take 120 mLs by mouth 2 (two) times daily. With med pass.   OMEPRAZOLE (PRILOSEC) 20  MG CAPSULE    Take 20 mg by mouth daily.   POLYETHYLENE GLYCOL (MIRALAX / GLYCOLAX) PACKET    Take 17 g by mouth. Fill cap to 17 gm mark, mix with 4 oz of fluid and take by mouth every other day.  Modified Medications   No medications on file  Discontinued Medications   No medications on file     Physical Exam: Physical Exam  Constitutional: She appears well-developed and well-nourished. No distress.  HENT:  Head: Normocephalic and atraumatic.  Nose: Nose normal.  Mouth/Throat: Oropharynx is clear and moist. No oropharyngeal exudate.  Eyes: Conjunctivae and EOM are normal. Pupils are equal, round, and reactive to light. Right eye exhibits no discharge. Left eye exhibits no discharge. No scleral icterus.  Neck: Normal range of motion. Neck supple. No JVD present. No thyromegaly present.  Cardiovascular: Normal rate, regular rhythm and normal heart sounds.   No murmur heard. Pulmonary/Chest: Effort normal and breath sounds normal. No respiratory distress. She has no wheezes. She has no rales.  Abdominal: Soft. Bowel sounds are normal. She exhibits no distension. There is tenderness.  C/o stomachaches when eats too much and having black stools.    Musculoskeletal: Normal range of motion. She exhibits tenderness. She exhibits no edema.  Pain in the right shoulder/shoulder blade, neck, middle/lower back-gait instability  and frequent falling. C/o aches allover.   Lymphadenopathy:    She has no cervical adenopathy.  Neurological: She is alert. She has normal reflexes. No cranial nerve deficit. She exhibits normal muscle tone. Coordination normal.  Skin: Skin is warm and dry. No rash noted. She is not diaphoretic. No erythema.  Stage II pressure ulcer right buttock. Mobile, smooth, a large marble size, non-tender at left breast 7 o'clock  Psychiatric: Her mood appears not anxious. Her affect is inappropriate. Her affect is not angry, not blunt and not labile. Her speech is delayed. Her speech is not rapid and/or pressured, not tangential and not slurred. She is slowed. She is not agitated, not aggressive, not hyperactive, not withdrawn and not actively hallucinating. Thought content is not paranoid and not delusional. Cognition and memory are impaired. She expresses impulsivity and inappropriate judgment. She does not exhibit a depressed mood. She is communicative. She exhibits abnormal recent memory. She exhibits normal remote memory. She is attentive.    Filed Vitals:   07/26/14 1354  BP: 140/70  Pulse: 72  Temp: 97 F (36.1 C)  TempSrc: Tympanic  Resp: 18      Labs reviewed: Basic Metabolic Panel:  Recent Labs  16/10/96 10/05/13 03/23/14 07/03/14  NA 136*  --  138 144  K 3.7  --  4.5 3.7  BUN 18  --  33* 26*  CREATININE 0.7  --  0.8 0.7  TSH  --  1.12  --   --     CBC:     Component Value Date/Time   WBC 5.7 07/03/2014   WBC 6.0 05/28/2012 1205   RBC 4.38 05/28/2012 1205   HGB 12.0 07/03/2014   HCT 37 07/03/2014   PLT 112* 07/03/2014   MCV 90.9 05/28/2012 1205   MCH 29.9 05/28/2012 1205   MCHC 32.9 05/28/2012 1205   RDW 12.9 05/28/2012 1205   LYMPHSABS 1.4 05/28/2012 1205   MONOABS 0.6 05/28/2012 1205   EOSABS 0.1 05/28/2012 1205   BASOSABS 0.0 05/28/2012 1205   Assessment/Plan DEMENTIA In SNF for care. Continue Aricept and Namenda.    Constipation MiraLax prn and Colace  bid-stable.  Thoracic back pain Hx of c/o  pain in her right shoulder and shoulder blade(no decreased ROM of the right shoulder) and lower back  Hx of middle of her spine vertebral body got out of alignment from falling on staircase in 1950--chronic on and off pain since the event. Chronic pain is managed with Norco bid started 04/04/14. Prn Norco is still available to her.      Descending thoracic aortic dissection C/o back pain between shoulder blades and stomachaches if eats too much. May consider MRA to re-evaluate the  Thoracic aortic dissection.   Hypertension Controlled on Amlodipine  and Metoprolol  bid.      GERD (gastroesophageal reflux disease) C/o stomachaches when eats too much. off Celebrex and continue Prilosec  daily.       Depression with anxiety Stable, takes Mirtazapine 7.5mg  nightly, weights stable.    Breast lump on left side at 7 o'clock position Mobile, smooth, a large marble size, non-tender at left breast 7 o'clock-will schedule Mammogram to evaluate further.  07/05/14 POA declined Mammogram. Desires no IVF. ABT for comfort measures only.      Family/ Staff Communication: observe the patient  Goals of Care: SNF  Labs/tests ordered: Mammogram(POA declined) MRA for hx aortic dissected aneurysm(POA declined)

## 2014-07-26 NOTE — Progress Notes (Signed)
Patient ID: Jamie [name], female   DOB: 1920/02/21, 79 y.o.   MRN: [id number] Patient ID: Jamie [name], female   DOB: 1920/06/01, 80 y.o.   MRN: [id number]   Code Status: DNR  Allergies  Allergen Reactions  . Alendronate Sodium     REACTION: GI    Chief Complaint  Patient presents with  . Medical Management of Chronic Issues    HPI: Patient is a 80 y.o. female seen in the SNF at [facility] today for evaluation of left breast lump, black stools, stomachaches when eats too much, chronic medical conditions.  Problem List Items Addressed This Visit   DEMENTIA     In SNF for care. Continue Aricept and Namenda.      Constipation     MiraLax prn and Colace  bid-stable.       Thoracic back pain - Primary     Hx of c/o  pain in her right shoulder and shoulder blade(no decreased ROM of the right shoulder) and lower back  Hx of middle of her spine vertebral body got out of alignment from falling on staircase in 1950--chronic on and off pain since the event. Chronic pain is managed with Norco bid started 04/04/14. Prn Norco is still available to her.        Descending thoracic aortic dissection     C/o back pain between shoulder blades and stomachaches if eats too much. May consider MRA to re-evaluate the  Thoracic aortic dissection.     Hypertension     Controlled on Amlodipine  and Metoprolol  bid.        GERD (gastroesophageal reflux disease)     C/o stomachaches when eats too much. off Celebrex and continue Prilosec  daily.         Depression with anxiety     Stable, takes Mirtazapine 7.5mg  nightly, weights stable.      Breast lump on left side at 7 o'clock position     Mobile, smooth, a large marble size, non-tender at left breast 7 o'clock-will schedule Mammogram to evaluate further.  07/05/14 POA declined Mammogram. Desires no IVF. ABT for comfort measures only.         Review of Systems:  Review of Systems  Constitutional: Negative for  fever, chills, weight loss, malaise/fatigue and diaphoresis.  HENT: Positive for hearing loss. Negative for congestion, ear pain and sore throat.   Eyes: Negative for pain, discharge and redness.  Respiratory: Negative for cough, sputum production, shortness of breath and wheezing.   Cardiovascular: Negative for chest pain, orthopnea, claudication, leg swelling and PND.  Gastrointestinal: Positive for abdominal pain. Negative for heartburn, nausea, vomiting, diarrhea, constipation and blood in stool.       C/o stomachaches when eats too much and having black stools.   Genitourinary: Positive for frequency. Negative for dysuria, urgency, hematuria and flank pain.  Musculoskeletal: Positive for back pain, falls and joint pain. Negative for myalgias and neck pain.       Gait instability. Worsened lower back pain. C/o aches allover-takes Celebrex and Tylenol routinely-asked for prn Tylenol frequently. Staff reported the patient crawled out in hallway on floor 03/22/14 w/o apparent injury.   Skin: Negative for itching and rash.       Mobile, smooth, a large marble size, non-tender at left breast 7 o'clock  Neurological: Positive for weakness (generalized. ). Negative for dizziness, tingling, tremors, sensory change, speech change, focal weakness, seizures, loss of consciousness and headaches.  Endo/Heme/Allergies: Negative for  environmental allergies and polydipsia. Does not bruise/bleed easily.  Psychiatric/Behavioral: Positive for memory loss. Negative for depression and hallucinations. The patient is not nervous/anxious and does not have insomnia.      Past Medical History  Diagnosis Date  . Osteoporosis   . Allergic rhinitis   . Dementia   . Osteoarthritis     hands  . IBS (irritable bowel syndrome)   . GERD (gastroesophageal reflux disease)   . Hypertension   . Palpitation     a. PAC's & PVC's by Holter - 2011  . Tortuous colon 2002  . Descending thoracic aortic dissection 04/24/2010     a. MRA 2011 - begins distal to left subclavian and extends throughout the full length of the Ao.  Marland Kitchen Aortic insufficiency 04/16/2010    a. Echo 2011 - mild to moderate AI, EF 60%.  . Anemia, unspecified 01/12/2013  . Other abnormal blood chemistry 01/12/2013  . Lumbago 10/06/2012  . Personal history of fall 10/06/2012  . Hyposmolality and/or hyponatremia 09/29/2012  . Otitis externa 09/29/2012  . Impacted cerumen 09/29/2012  . Unspecified conductive hearing loss 09/29/2012  . External hemorrhoids without mention of complication 09/29/2012  . Unspecified venous (peripheral) insufficiency 09/29/2012  . Unspecified constipation 09/29/2012  . Abnormality of gait 09/29/2012  . Congestive heart failure, unspecified 06/30/2003   Past Surgical History  Procedure Laterality Date  . Dilation and curettage of uterus  1960  . Cataract extraction w/ intraocular lens implant     Social History:   reports that she has never smoked. She has never used smokeless tobacco. She reports that she does not drink alcohol or use illicit drugs.  Family History  Problem Relation Age of Onset  . Diabetes Mother   . Coronary artery disease Father   . Hypertension Father   . Diabetes      sisters    Medications: Patient's Medications  New Prescriptions   No medications on file  Previous Medications   ACETAMINOPHEN (TYLENOL) 325 MG TABLET    Take 650 mg by mouth every 6 (six) hours as needed.    AMLODIPINE (NORVASC) 5 MG TABLET    Take 1 tablet (5 mg total) by mouth daily.   ARTIFICIAL TEAR SOLUTION (TEARS NATURALE II OP)    Apply 1 drop to eye daily. Both eyes twice daily   AZELASTINE (ASTELIN) 137 MCG/SPRAY NASAL SPRAY    Place 2 sprays into the nose. At bedtime   CALCIUM CITRATE-VITAMIN D (CITRACAL+D) 315-200 MG-UNIT PER TABLET    Take 2 tablets by mouth. Take twice daily   CHOLECALCIFEROL (VITAMIN D) 2000 UNITS CAPS    Take by mouth daily.     DOCUSATE SODIUM (COLACE) 100 MG CAPSULE    Take 100 mg by mouth 2  (two) times daily.   DONEPEZIL (ARICEPT) 10 MG TABLET    Take 1 tablet (10 mg total) by mouth daily.   HYDROCODONE-ACETAMINOPHEN (NORCO/VICODIN) 5-325 MG PER TABLET    Take 1 tablet by mouth every 4 (four) hours as needed for moderate pain. One tablet bid   MEMANTINE (NAMENDA) 10 MG TABLET    Take 28 mg by mouth daily.    METOPROLOL (LOPRESSOR) 50 MG TABLET    Take 1 tablet (50 mg total) by mouth 2 (two) times daily.   MIRTAZAPINE (REMERON) 7.5 MG TABLET    Take 7.5 mg by mouth at bedtime.   MULTIPLE VITAMINS-MINERALS (CENTRUM PO)    Take 1 tablet by mouth 1 day or 1 dose.  NUTRITIONAL SUPPLEMENTS (RESOURCE PO)    Take 120 mLs by mouth 2 (two) times daily. With med pass.   OMEPRAZOLE (PRILOSEC) 20 MG CAPSULE    Take 20 mg by mouth daily.   POLYETHYLENE GLYCOL (MIRALAX / GLYCOLAX) PACKET    Take 17 g by mouth. Fill cap to 17 gm mark, mix with 4 oz of fluid and take by mouth every other day.  Modified Medications   No medications on file  Discontinued Medications   No medications on file     Physical Exam: Physical Exam  Constitutional: She appears well-developed and well-nourished. No distress.  HENT:  Head: Normocephalic and atraumatic.  Nose: Nose normal.  Mouth/Throat: Oropharynx is clear and moist. No oropharyngeal exudate.  Eyes: Conjunctivae and EOM are normal. Pupils are equal, round, and reactive to light. Right eye exhibits no discharge. Left eye exhibits no discharge. No scleral icterus.  Neck: Normal range of motion. Neck supple. No JVD present. No thyromegaly present.  Cardiovascular: Normal rate and regular rhythm.   Murmur heard. Systolic murmur appreciated at the left sternal border 2/6  Pulmonary/Chest: Effort normal and breath sounds normal. No respiratory distress. She has no wheezes. She has no rales.  Abdominal: Soft. Bowel sounds are normal. She exhibits no distension. There is tenderness.  C/o stomachaches when eats too much and having black stools.      Musculoskeletal: Normal range of motion. She exhibits tenderness. She exhibits no edema.  Pain in the right shoulder/shoulder blade, neck, middle/lower back-gait instability and frequent falling. C/o aches allover.   Lymphadenopathy:    She has no cervical adenopathy.  Neurological: She is alert. She has normal reflexes. No cranial nerve deficit. She exhibits normal muscle tone. Coordination normal.  Skin: Skin is warm and dry. No rash noted. She is not diaphoretic. No erythema.  Stage II pressure ulcer right buttock. Mobile, smooth, a large marble size, non-tender at left breast 7 o'clock  Psychiatric: Her mood appears not anxious. Her affect is inappropriate. Her affect is not angry, not blunt and not labile. Her speech is delayed. Her speech is not rapid and/or pressured, not tangential and not slurred. She is slowed. She is not agitated, not aggressive, not hyperactive, not withdrawn and not actively hallucinating. Thought content is not paranoid and not delusional. Cognition and memory are impaired. She expresses impulsivity and inappropriate judgment. She does not exhibit a depressed mood. She is communicative. She exhibits abnormal recent memory. She exhibits normal remote memory. She is attentive.    Filed Vitals:   07/26/14 1354  BP: 140/70  Pulse: 72  Temp: 97 F (36.1 C)  TempSrc: Tympanic  Resp: 18      Labs reviewed: Basic Metabolic Panel:  Recent Labs  09/81/19 10/05/13 03/23/14 07/03/14  NA 136*  --  138 144  K 3.7  --  4.5 3.7  BUN 18  --  33* 26*  CREATININE 0.7  --  0.8 0.7  TSH  --  1.12  --   --     CBC:     Component Value Date/Time   WBC 5.7 07/03/2014   WBC 6.0 05/28/2012 1205   RBC 4.38 05/28/2012 1205   HGB 12.0 07/03/2014   HCT 37 07/03/2014   PLT 112* 07/03/2014   MCV 90.9 05/28/2012 1205   MCH 29.9 05/28/2012 1205   MCHC 32.9 05/28/2012 1205   RDW 12.9 05/28/2012 1205   LYMPHSABS 1.4 05/28/2012 1205   MONOABS 0.6 05/28/2012 1205   EOSABS 0.1  05/28/2012 1205    BASOSABS 0.0 05/28/2012 1205   Assessment/Plan DEMENTIA In SNF for care. Continue Aricept and Namenda.    Constipation MiraLax prn and Colace  bid-stable.     Thoracic back pain Hx of c/o  pain in her right shoulder and shoulder blade(no decreased ROM of the right shoulder) and lower back  Hx of middle of her spine vertebral body got out of alignment from falling on staircase in 1950--chronic on and off pain since the event. Chronic pain is managed with Norco bid started 04/04/14. Prn Norco is still available to her.      Descending thoracic aortic dissection C/o back pain between shoulder blades and stomachaches if eats too much. May consider MRA to re-evaluate the  Thoracic aortic dissection.   Hypertension Controlled on Amlodipine  and Metoprolol  bid.      GERD (gastroesophageal reflux disease) C/o stomachaches when eats too much. off Celebrex and continue Prilosec  daily.       Depression with anxiety Stable, takes Mirtazapine 7.5mg  nightly, weights stable.    Breast lump on left side at 7 o'clock position Mobile, smooth, a large marble size, non-tender at left breast 7 o'clock-will schedule Mammogram to evaluate further.  07/05/14 POA declined Mammogram. Desires no IVF. ABT for comfort measures only.      Family/ Staff Communication: observe the patient  Goals of Care: SNF  Labs/tests ordered: Mammogram, CBC, BMP, Guaiac stool x3. MRA for hx aortic dissected aneurysm.

## 2014-07-26 NOTE — Assessment & Plan Note (Signed)
MiraLax prn and Colace 100mg bid-stable.     

## 2014-08-11 DIAGNOSIS — N390 Urinary tract infection, site not specified: Secondary | ICD-10-CM | POA: Diagnosis not present

## 2014-08-29 ENCOUNTER — Other Ambulatory Visit: Payer: Self-pay | Admitting: *Deleted

## 2014-08-29 MED ORDER — HYDROCODONE-ACETAMINOPHEN 5-325 MG PO TABS: ORAL_TABLET | ORAL | Status: DC

## 2014-08-29 NOTE — Telephone Encounter (Signed)
[facility] Fax Order 

## 2014-09-12 ENCOUNTER — Encounter: Payer: Self-pay | Admitting: Nurse Practitioner

## 2014-09-12 ENCOUNTER — Non-Acute Institutional Stay (SKILLED_NURSING_FACILITY): Payer: Medicare Other | Admitting: Nurse Practitioner

## 2014-09-12 DIAGNOSIS — F0150 Vascular dementia without behavioral disturbance: Secondary | ICD-10-CM

## 2014-09-12 DIAGNOSIS — M546 Pain in thoracic spine: Secondary | ICD-10-CM

## 2014-09-12 DIAGNOSIS — K5900 Constipation, unspecified: Secondary | ICD-10-CM

## 2014-09-12 DIAGNOSIS — N6324 Unspecified lump in the left breast, lower inner quadrant: Secondary | ICD-10-CM

## 2014-09-12 DIAGNOSIS — I7101 Dissection of thoracic aorta: Secondary | ICD-10-CM

## 2014-09-12 DIAGNOSIS — I10 Essential (primary) hypertension: Secondary | ICD-10-CM | POA: Diagnosis not present

## 2014-09-12 DIAGNOSIS — K219 Gastro-esophageal reflux disease without esophagitis: Secondary | ICD-10-CM | POA: Diagnosis not present

## 2014-09-12 DIAGNOSIS — N63 Unspecified lump in breast: Secondary | ICD-10-CM

## 2014-09-12 DIAGNOSIS — F418 Other specified anxiety disorders: Secondary | ICD-10-CM

## 2014-09-12 DIAGNOSIS — I71012 Dissection of descending thoracic aorta: Secondary | ICD-10-CM

## 2014-09-12 NOTE — Assessment & Plan Note (Signed)
Mobile, smooth, a large marble size, non-tender at left breast 7 o'clock-will schedule Mammogram to evaluate further.  07/05/14 POA declined Mammogram. Desires no IVF. ABT for comfort measures only.

## 2014-09-12 NOTE — Assessment & Plan Note (Signed)
C/o stomachaches when eats too much. off Celebrex and continue Prilosec 20mg  daily. 07/07/14 Guaiac stools x3 negative.

## 2014-09-12 NOTE — Assessment & Plan Note (Signed)
a. MRA 2011 - begins distal to left subclavian and extends throughout the full length of the Ao. C/o back pain between shoulder blades and stomachaches if eats too much. May consider MRA to re-evaluate the  Thoracic aortic dissection.(POA declined)

## 2014-09-12 NOTE — Assessment & Plan Note (Signed)
In SNF for care. Continue Aricept and Namenda.

## 2014-09-12 NOTE — Assessment & Plan Note (Signed)
MiraLax prn and Colace 100mg  bid-stable.

## 2014-09-12 NOTE — Assessment & Plan Note (Signed)
Stable, takes Mirtazapine 7.5mg nightly, weights stable.    

## 2014-09-12 NOTE — Assessment & Plan Note (Signed)
(

## 2014-09-12 NOTE — Assessment & Plan Note (Signed)
Controlled on Amlodipine 5mg and Metoprolol 50mg bid.     

## 2014-09-12 NOTE — Progress Notes (Signed)
Patient ID: Jamie [name], female   DOB: 04/19/1920, 80 y.o.   MRN: [id number]   Code Status: DNR  Allergies  Allergen Reactions  . Alendronate Sodium     REACTION: GI    Chief Complaint  Patient presents with  . Medical Management of Chronic Issues    HPI: Patient is a 80 y.o. female [facility] today for evaluation of chronic medical conditions.  Problem List Items Addressed This Visit   Thoracic back pain     Hx of c/o  pain in her right shoulder and shoulder blade(no decreased ROM of the right shoulder) and lower back  Hx of middle of her spine vertebral body got out of alignment from falling on staircase in 1950--chronic on and off pain since the event. Chronic pain is managed with Norco bid started 04/04/14. Prn Norco is still available to her.        Hypertension - Primary     Controlled on Amlodipine 5mg  and Metoprolol 50mg  bid.       GERD (gastroesophageal reflux disease)     C/o stomachaches when eats too much. off Celebrex and continue Prilosec 20mg  daily. 07/07/14 Guaiac stools x3 negative.          Descending thoracic aortic dissection     a. MRA 2011 - begins distal to left subclavian and extends throughout the full length of the Ao. C/o back pain between shoulder blades and stomachaches if eats too much. May consider MRA to re-evaluate the  Thoracic aortic dissection.(POA declined)    Depression with anxiety     Stable, takes Mirtazapine 7.5mg  nightly, weights stable.       Dementia, vascular     In SNF for care. Continue Aricept and Namenda.       Constipation     MiraLax prn and Colace 100mg  bid-stable.        Breast lump on left side at 7 o'clock position     Mobile, smooth, a large marble size, non-tender at left breast 7 o'clock-will schedule Mammogram to evaluate further.  07/05/14 POA declined Mammogram. Desires no IVF. ABT for comfort measures only.         Review of Systems:  Review of Systems    Constitutional: Negative for fever, chills, weight loss, malaise/fatigue and diaphoresis.  HENT: Positive for hearing loss. Negative for congestion, ear pain and sore throat.   Eyes: Negative for pain, discharge and redness.  Respiratory: Negative for cough, sputum production, shortness of breath and wheezing.   Cardiovascular: Negative for chest pain, orthopnea, claudication, leg swelling and PND.  Gastrointestinal: Positive for abdominal pain. Negative for heartburn, nausea, vomiting, diarrhea, constipation and blood in stool.       C/o stomachaches when eats too much and having black stools.   Genitourinary: Positive for frequency. Negative for dysuria, urgency, hematuria and flank pain.  Musculoskeletal: Positive for back pain, falls and joint pain. Negative for myalgias and neck pain.       Gait instability. Worsened lower back pain. C/o aches allover-takes Celebrex and Tylenol routinely-asked for prn Tylenol frequently. Staff reported the patient crawled out in hallway on floor 03/22/14 w/o apparent injury.   Skin: Negative for itching and rash.       Mobile, smooth, a large marble size, non-tender at left breast 7 o'clock  Neurological: Positive for weakness (generalized. ). Negative for dizziness, tingling, tremors, sensory change, speech change, focal weakness, seizures, loss of consciousness and headaches.  Endo/Heme/Allergies:  Negative for environmental allergies and polydipsia. Does not bruise/bleed easily.  Psychiatric/Behavioral: Positive for memory loss. Negative for depression and hallucinations. The patient is not nervous/anxious and does not have insomnia.      Past Medical History  Diagnosis Date  . Osteoporosis   . Allergic rhinitis   . Dementia   . Osteoarthritis     hands  . IBS (irritable bowel syndrome)   . GERD (gastroesophageal reflux disease)   . Hypertension   . Palpitation     a. PAC's & PVC's by Holter - 2011  . Tortuous colon 2002  . Descending thoracic  aortic dissection 04/24/2010    a. MRA 2011 - begins distal to left subclavian and extends throughout the full length of the Ao.  Marland Kitchen. Aortic insufficiency 04/16/2010    a. Echo 2011 - mild to moderate AI, EF 60%.  . Anemia, unspecified 01/12/2013  . Other abnormal blood chemistry 01/12/2013  . Lumbago 10/06/2012  . Personal history of fall 10/06/2012  . Hyposmolality and/or hyponatremia 09/29/2012  . Otitis externa 09/29/2012  . Impacted cerumen 09/29/2012  . Unspecified conductive hearing loss 09/29/2012  . External hemorrhoids without mention of complication 09/29/2012  . Unspecified venous (peripheral) insufficiency 09/29/2012  . Unspecified constipation 09/29/2012  . Abnormality of gait 09/29/2012  . Congestive heart failure, unspecified 06/30/2003   Past Surgical History  Procedure Laterality Date  . Dilation and curettage of uterus  1960  . Cataract extraction w/ intraocular lens implant     Social History:   reports that she has never smoked. She has never used smokeless tobacco. She reports that she does not drink alcohol or use illicit drugs.  Family History  Problem Relation Age of Onset  . Diabetes Mother   . Coronary artery disease Father   . Hypertension Father   . Diabetes      sisters    Medications: Patient's Medications  New Prescriptions   No medications on file  Previous Medications   ACETAMINOPHEN (TYLENOL) 325 MG TABLET    Take 650 mg by mouth every 6 (six) hours as needed.    AMLODIPINE (NORVASC) 5 MG TABLET    Take 1 tablet (5 mg total) by mouth daily.   ARTIFICIAL TEAR SOLUTION (TEARS NATURALE II OP)    Apply 1 drop to eye daily. Both eyes twice daily   AZELASTINE (ASTELIN) 137 MCG/SPRAY NASAL SPRAY    Place 2 sprays into the nose. At bedtime   CALCIUM CITRATE-VITAMIN D (CITRACAL+D) 315-200 MG-UNIT PER TABLET    Take 2 tablets by mouth. Take twice daily   CHOLECALCIFEROL (VITAMIN D) 2000 UNITS CAPS    Take by mouth daily.     DOCUSATE SODIUM (COLACE) 100 MG  CAPSULE    Take 100 mg by mouth 2 (two) times daily.   DONEPEZIL (ARICEPT) 10 MG TABLET    Take 1 tablet (10 mg total) by mouth daily.   HYDROCODONE-ACETAMINOPHEN (NORCO/VICODIN) 5-325 MG PER TABLET    Take one tablet by mouth once daily for pain   MEMANTINE (NAMENDA) 10 MG TABLET    Take 28 mg by mouth daily.    METOPROLOL (LOPRESSOR) 50 MG TABLET    Take 1 tablet (50 mg total) by mouth 2 (two) times daily.   MIRTAZAPINE (REMERON) 7.5 MG TABLET    Take 7.5 mg by mouth at bedtime.   MULTIPLE VITAMINS-MINERALS (CENTRUM PO)    Take 1 tablet by mouth 1 day or 1 dose.   NUTRITIONAL SUPPLEMENTS (RESOURCE PO)  Take 120 mLs by mouth 2 (two) times daily. With med pass.   OMEPRAZOLE (PRILOSEC) 20 MG CAPSULE    Take 20 mg by mouth daily.   POLYETHYLENE GLYCOL (MIRALAX / GLYCOLAX) PACKET    Take 17 g by mouth. Fill cap to 17 gm mark, mix with 4 oz of fluid and take by mouth every other day.  Modified Medications   No medications on file  Discontinued Medications   No medications on file     Physical Exam: Physical Exam  Constitutional: She appears well-developed and well-nourished. No distress.  HENT:  Head: Normocephalic and atraumatic.  Nose: Nose normal.  Mouth/Throat: Oropharynx is clear and moist. No oropharyngeal exudate.  Eyes: Conjunctivae and EOM are normal. Pupils are equal, round, and reactive to light. Right eye exhibits no discharge. Left eye exhibits no discharge. No scleral icterus.  Neck: Normal range of motion. Neck supple. No JVD present. No thyromegaly present.  Cardiovascular: Normal rate and regular rhythm.   Murmur heard. Systolic murmur appreciated at the left sternal border 2/6  Pulmonary/Chest: Effort normal and breath sounds normal. No respiratory distress. She has no wheezes. She has no rales.  Abdominal: Soft. Bowel sounds are normal. She exhibits no distension. There is tenderness.  C/o stomachaches when eats too much and having black stools.    Musculoskeletal:  Normal range of motion. She exhibits tenderness. She exhibits no edema.  Pain in the right shoulder/shoulder blade, neck, middle/lower back-gait instability and frequent falling. C/o aches allover.   Lymphadenopathy:    She has no cervical adenopathy.  Neurological: She is alert. She has normal reflexes. No cranial nerve deficit. She exhibits normal muscle tone. Coordination normal.  Skin: Skin is warm and dry. No rash noted. She is not diaphoretic. No erythema.  Stage II pressure ulcer right buttock. Mobile, smooth, a large marble size, non-tender at left breast 7 o'clock  Psychiatric: Her mood appears not anxious. Her affect is inappropriate. Her affect is not angry, not blunt and not labile. Her speech is delayed. Her speech is not rapid and/or pressured, not tangential and not slurred. She is slowed. She is not agitated, not aggressive, not hyperactive, not withdrawn and not actively hallucinating. Thought content is not paranoid and not delusional. Cognition and memory are impaired. She expresses impulsivity and inappropriate judgment. She does not exhibit a depressed mood. She is communicative. She exhibits abnormal recent memory. She exhibits normal remote memory. She is attentive.    Filed Vitals:   09/12/14 1049  BP: 122/68  Pulse: 88  Temp: 98.4 F (36.9 C)  TempSrc: Tympanic  Resp: 18      Labs reviewed: Basic Metabolic Panel:  Recent Labs  16/10/96 10/05/13 03/23/14 07/03/14  NA 136*  --  138 144  K 3.7  --  4.5 3.7  BUN 18  --  33* 26*  CREATININE 0.7  --  0.8 0.7  TSH  --  1.12  --   --     CBC:     Component Value Date/Time   WBC 5.7 07/03/2014   WBC 6.0 05/28/2012 1205   RBC 4.38 05/28/2012 1205   HGB 12.0 07/03/2014   HCT 37 07/03/2014   PLT 112* 07/03/2014   MCV 90.9 05/28/2012 1205   MCH 29.9 05/28/2012 1205   MCHC 32.9 05/28/2012 1205   RDW 12.9 05/28/2012 1205   LYMPHSABS 1.4 05/28/2012 1205   MONOABS 0.6 05/28/2012 1205   EOSABS 0.1 05/28/2012 1205   BASOSABS 0.0  05/28/2012  1205   Assessment/Plan Thoracic back pain Hx of c/o  pain in her right shoulder and shoulder blade(no decreased ROM of the right shoulder) and lower back  Hx of middle of her spine vertebral body got out of alignment from falling on staircase in 1950--chronic on and off pain since the event. Chronic pain is managed with Norco bid started 04/04/14. Prn Norco is still available to her.      Hypertension Controlled on Amlodipine 5mg  and Metoprolol 50mg  bid.     GERD (gastroesophageal reflux disease) C/o stomachaches when eats too much. off Celebrex and continue Prilosec 20mg  daily. 07/07/14 Guaiac stools x3 negative.        Descending thoracic aortic dissection a. MRA 2011 - begins distal to left subclavian and extends throughout the full length of the Ao. C/o back pain between shoulder blades and stomachaches if eats too much. May consider MRA to re-evaluate the  Thoracic aortic dissection.(POA declined)  Depression with anxiety Stable, takes Mirtazapine 7.5mg  nightly, weights stable.     Dementia, vascular In SNF for care. Continue Aricept and Namenda.     Constipation MiraLax prn and Colace 100mg  bid-stable.      Breast lump on left side at 7 o'clock position Mobile, smooth, a large marble size, non-tender at left breast 7 o'clock-will schedule Mammogram to evaluate further.  07/05/14 POA declined Mammogram. Desires no IVF. ABT for comfort measures only.      Family/ Staff Communication: observe the patient  Goals of Care: SNF  Labs/tests ordered: POA declined Mammogram+MRA for hx aortic dissected aneurysm.

## 2014-10-01 ENCOUNTER — Encounter: Payer: Self-pay | Admitting: Nurse Practitioner

## 2014-10-01 ENCOUNTER — Non-Acute Institutional Stay (SKILLED_NURSING_FACILITY): Payer: Medicare Other | Admitting: Nurse Practitioner

## 2014-10-01 DIAGNOSIS — R634 Abnormal weight loss: Secondary | ICD-10-CM

## 2014-10-01 DIAGNOSIS — K5900 Constipation, unspecified: Secondary | ICD-10-CM | POA: Diagnosis not present

## 2014-10-01 DIAGNOSIS — I10 Essential (primary) hypertension: Secondary | ICD-10-CM

## 2014-10-01 DIAGNOSIS — F0150 Vascular dementia without behavioral disturbance: Secondary | ICD-10-CM

## 2014-10-01 DIAGNOSIS — K219 Gastro-esophageal reflux disease without esophagitis: Secondary | ICD-10-CM

## 2014-10-01 DIAGNOSIS — M546 Pain in thoracic spine: Secondary | ICD-10-CM | POA: Diagnosis not present

## 2014-10-01 DIAGNOSIS — F418 Other specified anxiety disorders: Secondary | ICD-10-CM | POA: Diagnosis not present

## 2014-10-01 NOTE — Assessment & Plan Note (Signed)
(

## 2014-10-01 NOTE — Assessment & Plan Note (Signed)
10/01/14 #11Ibs in the past 6 months. Regardless Mirtazapine. Possible contributory factors: ? Left breast cancer, progression of dementia, and loose stools. Will update CBC, CMP, TSH.

## 2014-10-01 NOTE — Progress Notes (Signed)
Patient ID: Jamie [name], female   DOB: 03/09/1920, 80 y.o.   MRN: [id number]   Code Status: DNR  Allergies  Allergen Reactions  . Alendronate Sodium     REACTION: GI    Chief Complaint  Patient presents with  . Medical Management of Chronic Issues  . Acute Visit    frequent loose stools.     HPI: Patient is a 80 y.o. female [facility] today for evaluation of loose stools, weight loss, and chronic medical conditions.  Problem List Items Addressed This Visit    Thoracic back pain    Hx of c/o  pain in her right shoulder and shoulder blade(no decreased ROM of the right shoulder) and lower back  Hx of middle of her spine vertebral body got out of alignment from falling on staircase in 1950--chronic on and off pain since the event. Chronic pain is managed with Norco bid started 04/04/14. Prn Norco is still available to her.         Loss of weight    10/01/14 #11Ibs in the past 6 months. Regardless Mirtazapine. Possible contributory factors: ? Left breast cancer, progression of dementia, and loose stools. Will update CBC, CMP, TSH.      Hypertension    Controlled on Amlodipine 5mg  and Metoprolol 50mg  bid.        GERD (gastroesophageal reflux disease)    Better since off Celebrex and continue Prilosec 20mg  daily. 07/07/14 Guaiac stools x3 negative.        Depression with anxiety    Stable, takes Mirtazapine 7.5mg  nightly, weights lost #11/6 mos.       Dementia, vascular    n SNF for care. Continue Aricept and Namenda.       Constipation - Primary    MiraLax prn and off Colace 100mg  bid 08/22/14. C/o frequent loose stools-Questran tid for now. Update CBC, CMP, and TSH          Review of Systems:  Review of Systems  Constitutional: Positive for weight loss. Negative for fever, chills, malaise/fatigue and diaphoresis.       # 11Ibs/6 months.   HENT: Positive for hearing loss. Negative for congestion, ear pain and sore throat.   Eyes:  Negative for pain, discharge and redness.  Respiratory: Negative for cough, sputum production, shortness of breath and wheezing.   Cardiovascular: Negative for chest pain, orthopnea, claudication, leg swelling and PND.  Gastrointestinal: Negative for heartburn, nausea, vomiting, abdominal pain, diarrhea, constipation and blood in stool.       Loose stools.   Genitourinary: Positive for frequency. Negative for dysuria, urgency, hematuria and flank pain.  Musculoskeletal: Positive for back pain, joint pain and falls. Negative for myalgias and neck pain.       Gait instability. Worsened lower back pain. C/o aches allover-takes Celebrex and Tylenol routinely-asked for prn Tylenol frequently. Staff reported the patient crawled out in hallway on floor 03/22/14 w/o apparent injury.   Skin: Negative for itching and rash.       Mobile, smooth, a large marble size, non-tender at left breast 7 o'clock  Neurological: Positive for weakness (generalized. ). Negative for dizziness, tingling, tremors, sensory change, speech change, focal weakness, seizures, loss of consciousness and headaches.  Endo/Heme/Allergies: Negative for environmental allergies and polydipsia. Does not bruise/bleed easily.  Psychiatric/Behavioral: Positive for memory loss. Negative for depression and hallucinations. The patient is not nervous/anxious and does not have insomnia.      Past Medical History  Diagnosis Date  . Osteoporosis   . Allergic rhinitis   . Dementia   . Osteoarthritis     hands  . IBS (irritable bowel syndrome)   . GERD (gastroesophageal reflux disease)   . Hypertension   . Palpitation     a. PAC's & PVC's by Holter - 2011  . Tortuous colon 2002  . Descending thoracic aortic dissection 04/24/2010    a. MRA 2011 - begins distal to left subclavian and extends throughout the full length of the Ao.  Marland Kitchen Aortic insufficiency 04/16/2010    a. Echo 2011 - mild to moderate AI, EF 60%.  . Anemia, unspecified 01/12/2013    . Other abnormal blood chemistry 01/12/2013  . Lumbago 10/06/2012  . Personal history of fall 10/06/2012  . Hyposmolality and/or hyponatremia 09/29/2012  . Otitis externa 09/29/2012  . Impacted cerumen 09/29/2012  . Unspecified conductive hearing loss 09/29/2012  . External hemorrhoids without mention of complication 09/29/2012  . Unspecified venous (peripheral) insufficiency 09/29/2012  . Unspecified constipation 09/29/2012  . Abnormality of gait 09/29/2012  . Congestive heart failure, unspecified 06/30/2003   Past Surgical History  Procedure Laterality Date  . Dilation and curettage of uterus  1960  . Cataract extraction w/ intraocular lens implant     Social History:   reports that she has never smoked. She has never used smokeless tobacco. She reports that she does not drink alcohol or use illicit drugs.  Family History  Problem Relation Age of Onset  . Diabetes Mother   . Coronary artery disease Father   . Hypertension Father   . Diabetes      sisters    Medications: Patient's Medications  New Prescriptions   No medications on file  Previous Medications   ACETAMINOPHEN (TYLENOL) 325 MG TABLET    Take 650 mg by mouth every 6 (six) hours as needed.    AMLODIPINE (NORVASC) 5 MG TABLET    Take 1 tablet (5 mg total) by mouth daily.   ARTIFICIAL TEAR SOLUTION (TEARS NATURALE II OP)    Apply 1 drop to eye daily. Both eyes twice daily   AZELASTINE (ASTELIN) 137 MCG/SPRAY NASAL SPRAY    Place 2 sprays into the nose. At bedtime   CALCIUM CITRATE-VITAMIN D (CITRACAL+D) 315-200 MG-UNIT PER TABLET    Take 2 tablets by mouth. Take twice daily   CHOLECALCIFEROL (VITAMIN D) 2000 UNITS CAPS    Take by mouth daily.     DOCUSATE SODIUM (COLACE) 100 MG CAPSULE    Take 100 mg by mouth 2 (two) times daily.   DONEPEZIL (ARICEPT) 10 MG TABLET    Take 1 tablet (10 mg total) by mouth daily.   HYDROCODONE-ACETAMINOPHEN (NORCO/VICODIN) 5-325 MG PER TABLET    Take one tablet by mouth once daily for pain    MEMANTINE (NAMENDA) 10 MG TABLET    Take 28 mg by mouth daily.    METOPROLOL (LOPRESSOR) 50 MG TABLET    Take 1 tablet (50 mg total) by mouth 2 (two) times daily.   MIRTAZAPINE (REMERON) 7.5 MG TABLET    Take 7.5 mg by mouth at bedtime.   MULTIPLE VITAMINS-MINERALS (CENTRUM PO)    Take 1 tablet by mouth 1 day or 1 dose.   NUTRITIONAL SUPPLEMENTS (RESOURCE PO)    Take 120 mLs by mouth 2 (two) times daily. With med pass.   OMEPRAZOLE (PRILOSEC) 20 MG CAPSULE    Take 20 mg by mouth daily.   POLYETHYLENE GLYCOL (MIRALAX / GLYCOLAX) PACKET  Take 17 g by mouth. Fill cap to 17 gm mark, mix with 4 oz of fluid and take by mouth every other day.  Modified Medications   No medications on file  Discontinued Medications   No medications on file     Physical Exam: Physical Exam  Constitutional: She appears well-developed and well-nourished. No distress.  HENT:  Head: Normocephalic and atraumatic.  Nose: Nose normal.  Mouth/Throat: Oropharynx is clear and moist. No oropharyngeal exudate.  Eyes: Conjunctivae and EOM are normal. Pupils are equal, round, and reactive to light. Right eye exhibits no discharge. Left eye exhibits no discharge. No scleral icterus.  Neck: Normal range of motion. Neck supple. No JVD present. No thyromegaly present.  Cardiovascular: Normal rate and regular rhythm.   Murmur heard. Systolic murmur appreciated at the left sternal border 2/6  Pulmonary/Chest: Effort normal and breath sounds normal. No respiratory distress. She has no wheezes. She has no rales.  Abdominal: Soft. Bowel sounds are normal. She exhibits no distension. There is no tenderness.     Musculoskeletal: Normal range of motion. She exhibits tenderness. She exhibits no edema.  Pain in the right shoulder/shoulder blade, neck, middle/lower back-gait instability and frequent falling. C/o aches allover.   Lymphadenopathy:    She has no cervical adenopathy.  Neurological: She is alert. She has normal reflexes.  No cranial nerve deficit. She exhibits normal muscle tone. Coordination normal.  Skin: Skin is warm and dry. No rash noted. She is not diaphoretic. No erythema.  Stage II pressure ulcer right buttock-healed 07/12/14. Mobile, smooth, a large marble size, non-tender at left breast 7 o'clock  Psychiatric: Her mood appears not anxious. Her affect is inappropriate. Her affect is not angry, not blunt and not labile. Her speech is delayed. Her speech is not rapid and/or pressured, not tangential and not slurred. She is slowed. She is not agitated, not aggressive, not hyperactive, not withdrawn and not actively hallucinating. Thought content is not paranoid and not delusional. Cognition and memory are impaired. She expresses impulsivity and inappropriate judgment. She does not exhibit a depressed mood. She is communicative. She exhibits abnormal recent memory. She exhibits normal remote memory. She is attentive.    Filed Vitals:   10/01/14 1456  BP: 140/88  Pulse: 66  Temp: 97.6 F (36.4 C)  TempSrc: Tympanic  Resp: 16      Labs reviewed: Basic Metabolic Panel:  Recent Labs  16/10/96 03/23/14 07/03/14  NA  --  138 144  K  --  4.5 3.7  BUN  --  33* 26*  CREATININE  --  0.8 0.7  TSH 1.12  --   --     CBC:     Component Value Date/Time   WBC 5.7 07/03/2014   WBC 6.0 05/28/2012 1205   RBC 4.38 05/28/2012 1205   HGB 12.0 07/03/2014   HCT 37 07/03/2014   PLT 112* 07/03/2014   MCV 90.9 05/28/2012 1205   MCH 29.9 05/28/2012 1205   MCHC 32.9 05/28/2012 1205   RDW 12.9 05/28/2012 1205   LYMPHSABS 1.4 05/28/2012 1205   MONOABS 0.6 05/28/2012 1205   EOSABS 0.1 05/28/2012 1205   BASOSABS 0.0 05/28/2012 1205   Assessment/Plan Unspecified constipation Hx of IBS. MiraLax prn and off Colace 100mg  bid 08/22/14. C/o frequent stools, sometimes loose, sometimes just very soft, stated she has BM each time she goes to [facility]. May consider Questran 4gm tid  Dementia, vascular n SNF for care. Continue  Aricept and Namenda.  Depression with anxiety Stable, takes Mirtazapine 7.5mg  nightly, weights lost #11/6 mos.     GERD (gastroesophageal reflux disease) Better since off Celebrex and continue Prilosec 20mg  daily. 07/07/14 Guaiac stools x3 negative.      Hypertension Controlled on Amlodipine 5mg  and Metoprolol 50mg  bid.      Thoracic back pain Hx of c/o  pain in her right shoulder and shoulder blade(no decreased ROM of the right shoulder) and lower back  Hx of middle of her spine vertebral body got out of alignment from falling on staircase in 1950--chronic on and off pain since the event. Chronic pain is managed with Norco bid started 04/04/14. Prn Norco is still available to her.       Loss of weight 10/01/14 #11Ibs in the past 6 months. Regardless Mirtazapine. Possible contributory factors: ? Left breast cancer, progression of dementia, and loose stools. Will update CBC, CMP, TSH.    Constipation MiraLax prn and off Colace 100mg  bid 08/22/14. C/o frequent loose stools-Questran tid for now. Update CBC, CMP, and TSH       Family/ Staff Communication: observe the patient  Goals of Care: SNF  Labs/tests ordered: POA declined Mammogram+MRA for hx aortic dissected aneurysm. CBC, CMP, and TSH

## 2014-10-01 NOTE — Assessment & Plan Note (Signed)
n SNF for care. Continue Aricept and Namenda.

## 2014-10-01 NOTE — Assessment & Plan Note (Signed)
MiraLax prn and off Colace 100mg  bid 08/22/14. C/o frequent loose stools-Questran tid for now. Update CBC, CMP, and TSH

## 2014-10-01 NOTE — Assessment & Plan Note (Signed)
Hx of IBS. MiraLax prn and off Colace 100mg  bid 08/22/14. C/o frequent stools, sometimes loose, sometimes just very soft, stated she has BM each time she goes to [facility]. May consider Questran 4gm tid

## 2014-10-01 NOTE — Assessment & Plan Note (Signed)
Controlled on Amlodipine 5mg  and Metoprolol 50mg  bid.

## 2014-10-01 NOTE — Assessment & Plan Note (Signed)
Stable, takes Mirtazapine 7.5mg  nightly, weights lost #11/6 mos.

## 2014-10-01 NOTE — Assessment & Plan Note (Signed)
Better since off Celebrex and continue Prilosec 20mg  daily. 07/07/14 Guaiac stools x3 negative.

## 2014-10-02 DIAGNOSIS — R634 Abnormal weight loss: Secondary | ICD-10-CM | POA: Diagnosis not present

## 2014-10-02 LAB — CBC AND DIFFERENTIAL
HCT: 37 % (ref 36–46)
Hemoglobin: 11.9 g/dL — AB (ref 12.0–16.0)
Platelets: 130 10*3/uL — AB (ref 150–399)
WBC: 6.1 10*3/mL

## 2014-10-02 LAB — HEPATIC FUNCTION PANEL
ALT: 8 U/L (ref 7–35)
AST: 12 U/L — AB (ref 13–35)
Alkaline Phosphatase: 40 U/L (ref 25–125)
Bilirubin, Total: 0.3 mg/dL

## 2014-10-02 LAB — BASIC METABOLIC PANEL
BUN: 29 mg/dL — AB (ref 4–21)
Creatinine: 0.8 mg/dL (ref 0.5–1.1)
GLUCOSE: 92 mg/dL
Potassium: 3.9 mmol/L (ref 3.4–5.3)
SODIUM: 141 mmol/L (ref 137–147)

## 2014-10-02 LAB — TSH: TSH: 0.9 u[IU]/mL (ref 0.41–5.90)

## 2014-10-03 ENCOUNTER — Other Ambulatory Visit: Payer: Self-pay | Admitting: Nurse Practitioner

## 2014-10-29 ENCOUNTER — Encounter: Payer: Self-pay | Admitting: Nurse Practitioner

## 2014-10-29 ENCOUNTER — Non-Acute Institutional Stay (SKILLED_NURSING_FACILITY): Payer: Medicare Other | Admitting: Nurse Practitioner

## 2014-10-29 DIAGNOSIS — M25552 Pain in left hip: Secondary | ICD-10-CM | POA: Diagnosis not present

## 2014-10-29 DIAGNOSIS — M159 Polyosteoarthritis, unspecified: Secondary | ICD-10-CM | POA: Diagnosis not present

## 2014-10-29 DIAGNOSIS — K5900 Constipation, unspecified: Secondary | ICD-10-CM

## 2014-10-29 DIAGNOSIS — N6324 Unspecified lump in the left breast, lower inner quadrant: Secondary | ICD-10-CM

## 2014-10-29 DIAGNOSIS — K219 Gastro-esophageal reflux disease without esophagitis: Secondary | ICD-10-CM

## 2014-10-29 DIAGNOSIS — W19XXXD Unspecified fall, subsequent encounter: Secondary | ICD-10-CM

## 2014-10-29 DIAGNOSIS — F418 Other specified anxiety disorders: Secondary | ICD-10-CM | POA: Diagnosis not present

## 2014-10-29 DIAGNOSIS — N63 Unspecified lump in breast: Secondary | ICD-10-CM

## 2014-10-29 DIAGNOSIS — I10 Essential (primary) hypertension: Secondary | ICD-10-CM

## 2014-10-29 DIAGNOSIS — F0150 Vascular dementia without behavioral disturbance: Secondary | ICD-10-CM

## 2014-10-29 NOTE — Assessment & Plan Note (Signed)
Controlled on Amlodipine 5mg and Metoprolol 50mg bid.     

## 2014-10-29 NOTE — Progress Notes (Signed)
Patient ID: Jamie [name], female   DOB: 08-31-1920, 80 y.o.   MRN: [id number]   Code Status: DNR  Allergies  Allergen Reactions  . Alendronate Sodium     REACTION: GI    Chief Complaint  Patient presents with  . Medical Management of Chronic Issues  . Acute Visit    fall 10/27/14    HPI: Patient is a 80 y.o. female seen in the SNF at [facility] today for evaluation of fall 10/27/14 and chronic medical conditions.  Problem List Items Addressed This Visit    OSTEOARTHROSIS, GENERALIZED, MULTIPLE SITES    Pain is reasonably controlled especially left hip, ambulates with walker on unit, takes Norco 5/325mg  qam and q4h prn, and Tylenol 650mg  q6 prn.    Left hip pain    Sustained from fall: 06/01/14 X-ray L hip & pelvis: no acute osseous abnormality.  Worsened left hip pain sustained from falling 10/27/14-pending X-ray     Hypertension - Primary    Controlled on Amlodipine 5mg  and Metoprolol 50mg  bid.        GERD (gastroesophageal reflux disease)    Better since off Celebrex and continue Prilosec 20mg  daily. 07/07/14 Guaiac stools x3 negative.       Fall    Fall: 03/22/14. 04/09/14 10/08/14 fell on her knee-no apparent injury.  10/27/14 fell-c/o left hip pain.      Depression with anxiety    Stable, takes Mirtazapine 7.5mg  nightly, mood is stable. Breast lumps may contribute to her gradual weight loss.       Dementia, vascular    SNF for care. Continue Aricept and Namenda. MMSE 22/30 10/26/14     Constipation    MiraLax prn and off Colace 100mg  bid 08/22/14. C/o frequent loose stools-Questran tid for now. 10/29/14 stable.     Breast lump on left side at 7 o'clock position    Mobile, smooth, a large marble size, non-tender at left breast 7 o'clock-will schedule Mammogram to evaluate further.  07/05/14 POA declined Mammogram. Desires no IVF. ABT for comfort measures only.          Review of Systems:  Review of Systems  Constitutional: Positive for weight  loss. Negative for fever, chills, malaise/fatigue and diaphoresis.       # 11Ibs/6 months.   HENT: Positive for hearing loss. Negative for congestion, ear pain and sore throat.   Eyes: Negative for pain, discharge and redness.  Respiratory: Negative for cough, sputum production, shortness of breath and wheezing.   Cardiovascular: Negative for chest pain, orthopnea, claudication, leg swelling and PND.  Gastrointestinal: Negative for heartburn, nausea, vomiting, abdominal pain, diarrhea, constipation and blood in stool.       Loose stools.   Genitourinary: Positive for frequency. Negative for dysuria, urgency, hematuria and flank pain.  Musculoskeletal: Positive for back pain, joint pain and falls. Negative for myalgias and neck pain.       Gait instability. Worsened lower back pain. C/o aches allover-takes Celebrex and Tylenol routinely-asked for prn Tylenol frequently. Staff reported the patient crawled out in hallway on floor 03/22/14 w/o apparent injury.  10/27/14 fall: beside her recliner-no injury  Skin: Negative for itching and rash.       Mobile, smooth, a large marble size, non-tender at left breast 7 o'clock  Neurological: Positive for weakness (generalized. ). Negative for dizziness, tingling, tremors, sensory change, speech change, focal weakness, seizures, loss of consciousness and headaches.  Endo/Heme/Allergies: Negative for environmental allergies and polydipsia. Does not bruise/bleed easily.  Psychiatric/Behavioral: Positive for memory loss. Negative for depression and hallucinations. The patient is not nervous/anxious and does not have insomnia.      Past Medical History  Diagnosis Date  . Osteoporosis   . Allergic rhinitis   . Dementia   . Osteoarthritis     hands  . IBS (irritable bowel syndrome)   . GERD (gastroesophageal reflux disease)   . Hypertension   . Palpitation     a. PAC's & PVC's by Holter - 2011  . Tortuous colon 2002  . Descending thoracic aortic  dissection 04/24/2010    a. MRA 2011 - begins distal to left subclavian and extends throughout the full length of the Ao.  Marland Kitchen Aortic insufficiency 04/16/2010    a. Echo 2011 - mild to moderate AI, EF 60%.  . Anemia, unspecified 01/12/2013  . Other abnormal blood chemistry 01/12/2013  . Lumbago 10/06/2012  . Personal history of fall 10/06/2012  . Hyposmolality and/or hyponatremia 09/29/2012  . Otitis externa 09/29/2012  . Impacted cerumen 09/29/2012  . Unspecified conductive hearing loss 09/29/2012  . External hemorrhoids without mention of complication 09/29/2012  . Unspecified venous (peripheral) insufficiency 09/29/2012  . Unspecified constipation 09/29/2012  . Abnormality of gait 09/29/2012  . Congestive heart failure, unspecified 06/30/2003   Past Surgical History  Procedure Laterality Date  . Dilation and curettage of uterus  1960  . Cataract extraction w/ intraocular lens implant     Social History:   reports that she has never smoked. She has never used smokeless tobacco. She reports that she does not drink alcohol or use illicit drugs.  Family History  Problem Relation Age of Onset  . Diabetes Mother   . Coronary artery disease Father   . Hypertension Father   . Diabetes      sisters    Medications: Patient's Medications  New Prescriptions   No medications on file  Previous Medications   ACETAMINOPHEN (TYLENOL) 325 MG TABLET    Take 650 mg by mouth every 6 (six) hours as needed.    AMLODIPINE (NORVASC) 5 MG TABLET    Take 1 tablet (5 mg total) by mouth daily.   ARTIFICIAL TEAR SOLUTION (TEARS NATURALE II OP)    Apply 1 drop to eye daily. Both eyes twice daily   AZELASTINE (ASTELIN) 137 MCG/SPRAY NASAL SPRAY    Place 2 sprays into the nose. At bedtime   CALCIUM CITRATE-VITAMIN D (CITRACAL+D) 315-200 MG-UNIT PER TABLET    Take 2 tablets by mouth. Take twice daily   CHOLECALCIFEROL (VITAMIN D) 2000 UNITS CAPS    Take by mouth daily.     DOCUSATE SODIUM (COLACE) 100 MG CAPSULE     Take 100 mg by mouth 2 (two) times daily.   DONEPEZIL (ARICEPT) 10 MG TABLET    Take 1 tablet (10 mg total) by mouth daily.   HYDROCODONE-ACETAMINOPHEN (NORCO/VICODIN) 5-325 MG PER TABLET    Take one tablet by mouth once daily for pain   MEMANTINE (NAMENDA) 10 MG TABLET    Take 28 mg by mouth daily.    METOPROLOL (LOPRESSOR) 50 MG TABLET    Take 1 tablet (50 mg total) by mouth 2 (two) times daily.   MIRTAZAPINE (REMERON) 7.5 MG TABLET    Take 7.5 mg by mouth at bedtime.   MULTIPLE VITAMINS-MINERALS (CENTRUM PO)    Take 1 tablet by mouth 1 day or 1 dose.   NUTRITIONAL SUPPLEMENTS (RESOURCE PO)    Take 120 mLs by mouth 2 (two) times  daily. With med pass.   OMEPRAZOLE (PRILOSEC) 20 MG CAPSULE    Take 20 mg by mouth daily.   POLYETHYLENE GLYCOL (MIRALAX / GLYCOLAX) PACKET    Take 17 g by mouth. Fill cap to 17 gm mark, mix with 4 oz of fluid and take by mouth every other day.  Modified Medications   No medications on file  Discontinued Medications   No medications on file     Physical Exam: Physical Exam  Constitutional: She appears well-developed and well-nourished. No distress.  HENT:  Head: Normocephalic and atraumatic.  Nose: Nose normal.  Mouth/Throat: Oropharynx is clear and moist. No oropharyngeal exudate.  Eyes: Conjunctivae and EOM are normal. Pupils are equal, round, and reactive to light. Right eye exhibits no discharge. Left eye exhibits no discharge. No scleral icterus.  Neck: Normal range of motion. Neck supple. No JVD present. No thyromegaly present.  Cardiovascular: Normal rate and regular rhythm.   Murmur heard. Systolic murmur appreciated at the left sternal border 2/6  Pulmonary/Chest: Effort normal and breath sounds normal. No respiratory distress. She has no wheezes. She has no rales.  Abdominal: Soft. Bowel sounds are normal. She exhibits no distension. There is no tenderness.     Musculoskeletal: Normal range of motion. She exhibits tenderness. She exhibits no  edema.  Pain in the right shoulder/shoulder blade, neck, middle/lower back-gait instability and frequent falling. C/o aches allover.   Lymphadenopathy:    She has no cervical adenopathy.  Neurological: She is alert. She has normal reflexes. No cranial nerve deficit. She exhibits normal muscle tone. Coordination normal.  Skin: Skin is warm and dry. No rash noted. She is not diaphoretic. No erythema.  Stage II pressure ulcer right buttock-healed 07/12/14. Mobile, smooth, a large marble size, non-tender at left breast 7 o'clock  Psychiatric: Her mood appears not anxious. Her affect is inappropriate. Her affect is not angry, not blunt and not labile. Her speech is delayed. Her speech is not rapid and/or pressured, not tangential and not slurred. She is slowed. She is not agitated, not aggressive, not hyperactive, not withdrawn and not actively hallucinating. Thought content is not paranoid and not delusional. Cognition and memory are impaired. She expresses impulsivity and inappropriate judgment. She does not exhibit a depressed mood. She is communicative. She exhibits abnormal recent memory. She exhibits normal remote memory. She is attentive.    Filed Vitals:   10/29/14 1101  BP: 130/70  Pulse: 82  Temp: 98.2 F (36.8 C)  TempSrc: Tympanic  Resp: 20      Labs reviewed: Basic Metabolic Panel:  Recent Labs  69/62/9503/12/08 07/03/14 10/02/14  NA 138 144 141  K 4.5 3.7 3.9  BUN 33* 26* 29*  CREATININE 0.8 0.7 0.8  TSH  --   --  0.90    CBC:     Component Value Date/Time   WBC 6.1 10/02/2014   WBC 6.0 05/28/2012 1205   RBC 4.38 05/28/2012 1205   HGB 11.9* 10/02/2014   HCT 37 10/02/2014   PLT 130* 10/02/2014   MCV 90.9 05/28/2012 1205   MCH 29.9 05/28/2012 1205   MCHC 32.9 05/28/2012 1205   RDW 12.9 05/28/2012 1205   LYMPHSABS 1.4 05/28/2012 1205   MONOABS 0.6 05/28/2012 1205   EOSABS 0.1 05/28/2012 1205   BASOSABS 0.0 05/28/2012 1205   Assessment/Plan OSTEOARTHROSIS,  GENERALIZED, MULTIPLE SITES Pain is reasonably controlled especially left hip, ambulates with walker on unit, takes Norco 5/325mg  qam and q4h prn, and Tylenol 650mg  q6 prn.  Hypertension Controlled on Amlodipine 5mg  and Metoprolol 50mg  bid.      Dementia, vascular SNF for care. Continue Aricept and Namenda. MMSE 22/30 10/26/14   GERD (gastroesophageal reflux disease) Better since off Celebrex and continue Prilosec 20mg  daily. 07/07/14 Guaiac stools x3 negative.     Constipation MiraLax prn and off Colace 100mg  bid 08/22/14. C/o frequent loose stools-Questran tid for now. 10/29/14 stable.   Depression with anxiety Stable, takes Mirtazapine 7.5mg  nightly, mood is stable. Breast lumps may contribute to her gradual weight loss.     Breast lump on left side at 7 o'clock position Mobile, smooth, a large marble size, non-tender at left breast 7 o'clock-will schedule Mammogram to evaluate further.  07/05/14 POA declined Mammogram. Desires no IVF. ABT for comfort measures only.     Fall Fall: 03/22/14. 04/09/14 10/08/14 fell on her knee-no apparent injury.  10/27/14 fell-c/o left hip pain.    Left hip pain Sustained from fall: 06/01/14 X-ray L hip & pelvis: no acute osseous abnormality.  Worsened left hip pain sustained from falling 10/27/14-pending X-ray     Family/ Staff Communication: observe the patient  Goals of Care: SNF  Labs/tests ordered: POA declined Mammogram+MRA for hx aortic dissected aneurysm. Xray left hip

## 2014-10-29 NOTE — Assessment & Plan Note (Signed)
Stable, takes Mirtazapine 7.5mg  nightly, mood is stable. Breast lumps may contribute to her gradual weight loss.

## 2014-10-29 NOTE — Assessment & Plan Note (Signed)
Pain is reasonably controlled especially left hip, ambulates with walker on unit, takes Norco 5/325mg  qam and q4h prn, and Tylenol 650mg  q6 prn.

## 2014-10-29 NOTE — Assessment & Plan Note (Signed)
MiraLax prn and off Colace 100mg  bid 08/22/14. C/o frequent loose stools-Questran tid for now. 10/29/14 stable.

## 2014-10-29 NOTE — Assessment & Plan Note (Signed)
Better since off Celebrex and continue Prilosec 20mg daily. 07/07/14 Guaiac stools x3 negative.     

## 2014-10-29 NOTE — Assessment & Plan Note (Signed)
Mobile, smooth, a large marble size, non-tender at left breast 7 o'clock-will schedule Mammogram to evaluate further.  07/05/14 POA declined Mammogram. Desires no IVF. ABT for comfort measures only.   

## 2014-10-29 NOTE — Assessment & Plan Note (Signed)
SNF for care. Continue Aricept and Namenda. MMSE 22/30 10/26/14

## 2014-10-30 DIAGNOSIS — M25559 Pain in unspecified hip: Secondary | ICD-10-CM | POA: Diagnosis not present

## 2014-10-30 NOTE — Assessment & Plan Note (Signed)
Sustained from fall: 06/01/14 X-ray L hip & pelvis: no acute osseous abnormality.  Worsened left hip pain sustained from falling 10/27/14-pending X-ray

## 2014-10-30 NOTE — Assessment & Plan Note (Signed)
Fall: 03/22/14. 04/09/14 10/08/14 fell on her knee-no apparent injury.  10/27/14 fell-c/o left hip pain.

## 2014-11-05 DIAGNOSIS — M6281 Muscle weakness (generalized): Secondary | ICD-10-CM | POA: Diagnosis not present

## 2014-11-05 DIAGNOSIS — R296 Repeated falls: Secondary | ICD-10-CM | POA: Diagnosis not present

## 2014-11-05 DIAGNOSIS — E871 Hypo-osmolality and hyponatremia: Secondary | ICD-10-CM | POA: Diagnosis not present

## 2014-11-05 DIAGNOSIS — H902 Conductive hearing loss, unspecified: Secondary | ICD-10-CM | POA: Diagnosis not present

## 2014-11-05 DIAGNOSIS — F0390 Unspecified dementia without behavioral disturbance: Secondary | ICD-10-CM | POA: Diagnosis not present

## 2014-11-05 DIAGNOSIS — R269 Unspecified abnormalities of gait and mobility: Secondary | ICD-10-CM | POA: Diagnosis not present

## 2014-11-05 DIAGNOSIS — I10 Essential (primary) hypertension: Secondary | ICD-10-CM | POA: Diagnosis not present

## 2014-11-06 DIAGNOSIS — M6281 Muscle weakness (generalized): Secondary | ICD-10-CM | POA: Diagnosis not present

## 2014-11-06 DIAGNOSIS — F0390 Unspecified dementia without behavioral disturbance: Secondary | ICD-10-CM | POA: Diagnosis not present

## 2014-11-06 DIAGNOSIS — R269 Unspecified abnormalities of gait and mobility: Secondary | ICD-10-CM | POA: Diagnosis not present

## 2014-11-06 DIAGNOSIS — H902 Conductive hearing loss, unspecified: Secondary | ICD-10-CM | POA: Diagnosis not present

## 2014-11-06 DIAGNOSIS — I10 Essential (primary) hypertension: Secondary | ICD-10-CM | POA: Diagnosis not present

## 2014-11-06 DIAGNOSIS — E871 Hypo-osmolality and hyponatremia: Secondary | ICD-10-CM | POA: Diagnosis not present

## 2014-11-08 DIAGNOSIS — R269 Unspecified abnormalities of gait and mobility: Secondary | ICD-10-CM | POA: Diagnosis not present

## 2014-11-08 DIAGNOSIS — M6281 Muscle weakness (generalized): Secondary | ICD-10-CM | POA: Diagnosis not present

## 2014-11-08 DIAGNOSIS — E871 Hypo-osmolality and hyponatremia: Secondary | ICD-10-CM | POA: Diagnosis not present

## 2014-11-08 DIAGNOSIS — F0390 Unspecified dementia without behavioral disturbance: Secondary | ICD-10-CM | POA: Diagnosis not present

## 2014-11-08 DIAGNOSIS — H902 Conductive hearing loss, unspecified: Secondary | ICD-10-CM | POA: Diagnosis not present

## 2014-11-08 DIAGNOSIS — I10 Essential (primary) hypertension: Secondary | ICD-10-CM | POA: Diagnosis not present

## 2014-11-11 DIAGNOSIS — R269 Unspecified abnormalities of gait and mobility: Secondary | ICD-10-CM | POA: Diagnosis not present

## 2014-11-11 DIAGNOSIS — E871 Hypo-osmolality and hyponatremia: Secondary | ICD-10-CM | POA: Diagnosis not present

## 2014-11-11 DIAGNOSIS — H902 Conductive hearing loss, unspecified: Secondary | ICD-10-CM | POA: Diagnosis not present

## 2014-11-11 DIAGNOSIS — M6281 Muscle weakness (generalized): Secondary | ICD-10-CM | POA: Diagnosis not present

## 2014-11-11 DIAGNOSIS — I10 Essential (primary) hypertension: Secondary | ICD-10-CM | POA: Diagnosis not present

## 2014-11-11 DIAGNOSIS — F0390 Unspecified dementia without behavioral disturbance: Secondary | ICD-10-CM | POA: Diagnosis not present

## 2014-11-12 DIAGNOSIS — I10 Essential (primary) hypertension: Secondary | ICD-10-CM | POA: Diagnosis not present

## 2014-11-12 DIAGNOSIS — E871 Hypo-osmolality and hyponatremia: Secondary | ICD-10-CM | POA: Diagnosis not present

## 2014-11-12 DIAGNOSIS — M6281 Muscle weakness (generalized): Secondary | ICD-10-CM | POA: Diagnosis not present

## 2014-11-12 DIAGNOSIS — H902 Conductive hearing loss, unspecified: Secondary | ICD-10-CM | POA: Diagnosis not present

## 2014-11-12 DIAGNOSIS — R269 Unspecified abnormalities of gait and mobility: Secondary | ICD-10-CM | POA: Diagnosis not present

## 2014-11-12 DIAGNOSIS — F0390 Unspecified dementia without behavioral disturbance: Secondary | ICD-10-CM | POA: Diagnosis not present

## 2014-11-13 DIAGNOSIS — I10 Essential (primary) hypertension: Secondary | ICD-10-CM | POA: Diagnosis not present

## 2014-11-13 DIAGNOSIS — H902 Conductive hearing loss, unspecified: Secondary | ICD-10-CM | POA: Diagnosis not present

## 2014-11-13 DIAGNOSIS — R269 Unspecified abnormalities of gait and mobility: Secondary | ICD-10-CM | POA: Diagnosis not present

## 2014-11-13 DIAGNOSIS — F0390 Unspecified dementia without behavioral disturbance: Secondary | ICD-10-CM | POA: Diagnosis not present

## 2014-11-13 DIAGNOSIS — M6281 Muscle weakness (generalized): Secondary | ICD-10-CM | POA: Diagnosis not present

## 2014-11-13 DIAGNOSIS — E871 Hypo-osmolality and hyponatremia: Secondary | ICD-10-CM | POA: Diagnosis not present

## 2014-11-17 DIAGNOSIS — I10 Essential (primary) hypertension: Secondary | ICD-10-CM | POA: Diagnosis not present

## 2014-11-17 DIAGNOSIS — R269 Unspecified abnormalities of gait and mobility: Secondary | ICD-10-CM | POA: Diagnosis not present

## 2014-11-17 DIAGNOSIS — M6281 Muscle weakness (generalized): Secondary | ICD-10-CM | POA: Diagnosis not present

## 2014-11-17 DIAGNOSIS — F0390 Unspecified dementia without behavioral disturbance: Secondary | ICD-10-CM | POA: Diagnosis not present

## 2014-11-17 DIAGNOSIS — H902 Conductive hearing loss, unspecified: Secondary | ICD-10-CM | POA: Diagnosis not present

## 2014-11-17 DIAGNOSIS — E871 Hypo-osmolality and hyponatremia: Secondary | ICD-10-CM | POA: Diagnosis not present

## 2014-11-19 DIAGNOSIS — H902 Conductive hearing loss, unspecified: Secondary | ICD-10-CM | POA: Diagnosis not present

## 2014-11-19 DIAGNOSIS — I10 Essential (primary) hypertension: Secondary | ICD-10-CM | POA: Diagnosis not present

## 2014-11-19 DIAGNOSIS — R269 Unspecified abnormalities of gait and mobility: Secondary | ICD-10-CM | POA: Diagnosis not present

## 2014-11-19 DIAGNOSIS — E871 Hypo-osmolality and hyponatremia: Secondary | ICD-10-CM | POA: Diagnosis not present

## 2014-11-19 DIAGNOSIS — M6281 Muscle weakness (generalized): Secondary | ICD-10-CM | POA: Diagnosis not present

## 2014-11-19 DIAGNOSIS — F0390 Unspecified dementia without behavioral disturbance: Secondary | ICD-10-CM | POA: Diagnosis not present

## 2014-11-20 DIAGNOSIS — F0390 Unspecified dementia without behavioral disturbance: Secondary | ICD-10-CM | POA: Diagnosis not present

## 2014-11-20 DIAGNOSIS — R269 Unspecified abnormalities of gait and mobility: Secondary | ICD-10-CM | POA: Diagnosis not present

## 2014-11-20 DIAGNOSIS — I10 Essential (primary) hypertension: Secondary | ICD-10-CM | POA: Diagnosis not present

## 2014-11-20 DIAGNOSIS — E871 Hypo-osmolality and hyponatremia: Secondary | ICD-10-CM | POA: Diagnosis not present

## 2014-11-20 DIAGNOSIS — H902 Conductive hearing loss, unspecified: Secondary | ICD-10-CM | POA: Diagnosis not present

## 2014-11-20 DIAGNOSIS — M6281 Muscle weakness (generalized): Secondary | ICD-10-CM | POA: Diagnosis not present

## 2014-11-21 DIAGNOSIS — E871 Hypo-osmolality and hyponatremia: Secondary | ICD-10-CM | POA: Diagnosis not present

## 2014-11-21 DIAGNOSIS — R269 Unspecified abnormalities of gait and mobility: Secondary | ICD-10-CM | POA: Diagnosis not present

## 2014-11-21 DIAGNOSIS — I10 Essential (primary) hypertension: Secondary | ICD-10-CM | POA: Diagnosis not present

## 2014-11-21 DIAGNOSIS — F0390 Unspecified dementia without behavioral disturbance: Secondary | ICD-10-CM | POA: Diagnosis not present

## 2014-11-21 DIAGNOSIS — H902 Conductive hearing loss, unspecified: Secondary | ICD-10-CM | POA: Diagnosis not present

## 2014-11-21 DIAGNOSIS — M6281 Muscle weakness (generalized): Secondary | ICD-10-CM | POA: Diagnosis not present

## 2014-11-22 DIAGNOSIS — I10 Essential (primary) hypertension: Secondary | ICD-10-CM | POA: Diagnosis not present

## 2014-11-22 DIAGNOSIS — R269 Unspecified abnormalities of gait and mobility: Secondary | ICD-10-CM | POA: Diagnosis not present

## 2014-11-22 DIAGNOSIS — F0390 Unspecified dementia without behavioral disturbance: Secondary | ICD-10-CM | POA: Diagnosis not present

## 2014-11-22 DIAGNOSIS — M6281 Muscle weakness (generalized): Secondary | ICD-10-CM | POA: Diagnosis not present

## 2014-11-22 DIAGNOSIS — H902 Conductive hearing loss, unspecified: Secondary | ICD-10-CM | POA: Diagnosis not present

## 2014-11-22 DIAGNOSIS — E871 Hypo-osmolality and hyponatremia: Secondary | ICD-10-CM | POA: Diagnosis not present

## 2014-11-23 DIAGNOSIS — M6281 Muscle weakness (generalized): Secondary | ICD-10-CM | POA: Diagnosis not present

## 2014-11-23 DIAGNOSIS — I10 Essential (primary) hypertension: Secondary | ICD-10-CM | POA: Diagnosis not present

## 2014-11-23 DIAGNOSIS — R2681 Unsteadiness on feet: Secondary | ICD-10-CM | POA: Diagnosis not present

## 2014-11-23 DIAGNOSIS — R296 Repeated falls: Secondary | ICD-10-CM | POA: Diagnosis not present

## 2014-11-26 DIAGNOSIS — R2681 Unsteadiness on feet: Secondary | ICD-10-CM | POA: Diagnosis not present

## 2014-11-26 DIAGNOSIS — R296 Repeated falls: Secondary | ICD-10-CM | POA: Diagnosis not present

## 2014-11-26 DIAGNOSIS — I10 Essential (primary) hypertension: Secondary | ICD-10-CM | POA: Diagnosis not present

## 2014-11-26 DIAGNOSIS — M6281 Muscle weakness (generalized): Secondary | ICD-10-CM | POA: Diagnosis not present

## 2014-11-27 DIAGNOSIS — M6281 Muscle weakness (generalized): Secondary | ICD-10-CM | POA: Diagnosis not present

## 2014-11-27 DIAGNOSIS — R2681 Unsteadiness on feet: Secondary | ICD-10-CM | POA: Diagnosis not present

## 2014-11-27 DIAGNOSIS — I10 Essential (primary) hypertension: Secondary | ICD-10-CM | POA: Diagnosis not present

## 2014-11-27 DIAGNOSIS — R296 Repeated falls: Secondary | ICD-10-CM | POA: Diagnosis not present

## 2014-11-28 ENCOUNTER — Non-Acute Institutional Stay (SKILLED_NURSING_FACILITY): Payer: Medicare Other | Admitting: Nurse Practitioner

## 2014-11-28 ENCOUNTER — Encounter: Payer: Self-pay | Admitting: Nurse Practitioner

## 2014-11-28 DIAGNOSIS — N63 Unspecified lump in breast: Secondary | ICD-10-CM | POA: Diagnosis not present

## 2014-11-28 DIAGNOSIS — I10 Essential (primary) hypertension: Secondary | ICD-10-CM | POA: Diagnosis not present

## 2014-11-28 DIAGNOSIS — K5900 Constipation, unspecified: Secondary | ICD-10-CM | POA: Diagnosis not present

## 2014-11-28 DIAGNOSIS — M6281 Muscle weakness (generalized): Secondary | ICD-10-CM | POA: Diagnosis not present

## 2014-11-28 DIAGNOSIS — F0150 Vascular dementia without behavioral disturbance: Secondary | ICD-10-CM | POA: Diagnosis not present

## 2014-11-28 DIAGNOSIS — K219 Gastro-esophageal reflux disease without esophagitis: Secondary | ICD-10-CM

## 2014-11-28 DIAGNOSIS — R296 Repeated falls: Secondary | ICD-10-CM | POA: Diagnosis not present

## 2014-11-28 DIAGNOSIS — F418 Other specified anxiety disorders: Secondary | ICD-10-CM

## 2014-11-28 DIAGNOSIS — R2681 Unsteadiness on feet: Secondary | ICD-10-CM | POA: Diagnosis not present

## 2014-11-28 DIAGNOSIS — M159 Polyosteoarthritis, unspecified: Secondary | ICD-10-CM

## 2014-11-28 DIAGNOSIS — M25552 Pain in left hip: Secondary | ICD-10-CM

## 2014-11-28 DIAGNOSIS — N6324 Unspecified lump in the left breast, lower inner quadrant: Secondary | ICD-10-CM

## 2014-11-28 NOTE — Assessment & Plan Note (Signed)
Controlled on Amlodipine 5mg and Metoprolol 50mg bid.     

## 2014-11-28 NOTE — Assessment & Plan Note (Signed)
Mobile, smooth, a large marble size, non-tender at left breast 7 o'clock-will schedule Mammogram to evaluate further.  07/05/14 POA declined Mammogram. Desires no IVF. ABT for comfort measures only. 11/28/14 seems larger in size, still mobile and non tender.

## 2014-11-28 NOTE — Assessment & Plan Note (Signed)
MiraLax prn and off Colace 100mg  bid 08/22/14. C/o frequent loose stools-Questran tid for now. 10/29/14 stable.  11/28/14 stable.

## 2014-11-28 NOTE — Assessment & Plan Note (Signed)
Sustained from fall: 06/01/14 X-ray L hip & pelvis: no acute osseous abnormality.  Worsened left hip pain sustained from falling 10/27/14-pending X-ray 11/28/14 better pain controlled.

## 2014-11-28 NOTE — Progress Notes (Signed)
Patient ID: Jamie [name], female   DOB: 1920-10-03, 81 y.o.   MRN: [id number]   Code Status: DNR  Allergies  Allergen Reactions  . Alendronate Sodium     REACTION: GI    Chief Complaint  Patient presents with  . Medical Management of Chronic Issues    HPI: Patient is a 81 y.o. female seen in the SNF at [facility] today for evaluation of aches allover and chronic medical conditions.  Problem List Items Addressed This Visit    OSTEOARTHROSIS, GENERALIZED, MULTIPLE SITES - Primary    Pain is reasonably controlled especially left hip-but still c/o aches allover, ambulates with walker on unit since Norco qam, increase Norco 5/325mg  to yif and q4h prn, and Tylenol  q6 prn. Observe.     Left hip pain    Sustained from fall: 06/01/14 X-ray L hip & pelvis: no acute osseous abnormality.  Worsened left hip pain sustained from falling 10/27/14-pending X-ray 11/28/14 better pain controlled.       Hypertension    Controlled on Amlodipine  and Metoprolol  bid.        GERD (gastroesophageal reflux disease)    Better since off Celebrex and continue Prilosec  daily. 07/07/14 Guaiac stools x3 negative.        Depression with anxiety    Stable, takes Mirtazapine 7.5mg  nightly, mood is stable. Breast lumps may contribute to her gradual weight loss.       Dementia, vascular    SNF for care. Continue Aricept and Namenda. MMSE 22/30 10/26/14      Constipation    MiraLax prn and off Colace  bid 08/22/14. C/o frequent loose stools-Questran tid for now. 10/29/14 stable.  11/28/14 stable.     Breast lump on left side at 7 o'clock position    Mobile, smooth, a large marble size, non-tender at left breast 7 o'clock-will schedule Mammogram to evaluate further.  07/05/14 POA declined Mammogram. Desires no IVF. ABT for comfort measures only. 11/28/14 seems larger in size, still mobile and non tender.        Review of Systems:  Review of Systems  Constitutional:  Positive for weight loss. Negative for fever, chills, malaise/fatigue and diaphoresis.  HENT: Positive for hearing loss. Negative for congestion, ear pain and sore throat.   Eyes: Negative for pain, discharge and redness.  Respiratory: Negative for cough, sputum production, shortness of breath and wheezing.   Cardiovascular: Negative for chest pain, orthopnea, claudication, leg swelling and PND.  Gastrointestinal: Negative for heartburn, nausea, vomiting, abdominal pain, diarrhea, constipation and blood in stool.  Genitourinary: Positive for frequency. Negative for dysuria, urgency, hematuria and flank pain.  Musculoskeletal: Positive for back pain, joint pain and falls. Negative for myalgias and neck pain.       Gait instability. Worsened lower back pain. C/o aches allover. Staff reported the patient crawled out in hallway on floor 03/22/14 w/o apparent injury.  10/27/14 fall: beside her recliner-no injury   Skin: Negative for itching and rash.       Mobile, smooth, a large marble size, non-tender at left breast 7 o'clock  Neurological: Positive for weakness (generalized. ). Negative for dizziness, tingling, tremors, sensory change, speech change, focal weakness, seizures, loss of consciousness and headaches.  Endo/Heme/Allergies: Negative for environmental allergies and polydipsia. Does not bruise/bleed easily.  Psychiatric/Behavioral: Positive for memory loss. Negative for depression and hallucinations. The patient is not nervous/anxious and does not have insomnia.      Past Medical History  Diagnosis  Date  . Osteoporosis   . Allergic rhinitis   . Dementia   . Osteoarthritis     hands  . IBS (irritable bowel syndrome)   . GERD (gastroesophageal reflux disease)   . Hypertension   . Palpitation     a. PAC's & PVC's by Holter - 2011  . Tortuous colon 2002  . Descending thoracic aortic dissection 04/24/2010    a. MRA 2011 - begins distal to left subclavian and extends throughout the full  length of the Ao.  Marland Kitchen Aortic insufficiency 04/16/2010    a. Echo 2011 - mild to moderate AI, EF 60%.  . Anemia, unspecified 01/12/2013  . Other abnormal blood chemistry 01/12/2013  . Lumbago 10/06/2012  . Personal history of fall 10/06/2012  . Hyposmolality and/or hyponatremia 09/29/2012  . Otitis externa 09/29/2012  . Impacted cerumen 09/29/2012  . Unspecified conductive hearing loss 09/29/2012  . External hemorrhoids without mention of complication 09/29/2012  . Unspecified venous (peripheral) insufficiency 09/29/2012  . Unspecified constipation 09/29/2012  . Abnormality of gait 09/29/2012  . Congestive heart failure, unspecified 06/30/2003   Past Surgical History  Procedure Laterality Date  . Dilation and curettage of uterus  1960  . Cataract extraction w/ intraocular lens implant     Social History:   reports that she has never smoked. She has never used smokeless tobacco. She reports that she does not drink alcohol or use illicit drugs.  Family History  Problem Relation Age of Onset  . Diabetes Mother   . Coronary artery disease Father   . Hypertension Father   . Diabetes      sisters    Medications: Patient's Medications  New Prescriptions   No medications on file  Previous Medications   ACETAMINOPHEN (TYLENOL) 325 MG TABLET    Take 650 mg by mouth every 6 (six) hours as needed.    AMLODIPINE (NORVASC) 5 MG TABLET    Take 1 tablet (5 mg total) by mouth daily.   ARTIFICIAL TEAR SOLUTION (TEARS NATURALE II OP)    Apply 1 drop to eye daily. Both eyes twice daily   AZELASTINE (ASTELIN) 137 MCG/SPRAY NASAL SPRAY    Place 2 sprays into the nose. At bedtime   CALCIUM CITRATE-VITAMIN D (CITRACAL+D) 315-200 MG-UNIT PER TABLET    Take 2 tablets by mouth. Take twice daily   CHOLECALCIFEROL (VITAMIN D) 2000 UNITS CAPS    Take by mouth daily.     DOCUSATE SODIUM (COLACE) 100 MG CAPSULE    Take 100 mg by mouth 2 (two) times daily.   DONEPEZIL (ARICEPT) 10 MG TABLET    Take 1 tablet (10 mg  total) by mouth daily.   HYDROCODONE-ACETAMINOPHEN (NORCO/VICODIN) 5-325 MG PER TABLET    Take one tablet by mouth once daily for pain   MEMANTINE (NAMENDA) 10 MG TABLET    Take 28 mg by mouth daily.    METOPROLOL (LOPRESSOR) 50 MG TABLET    Take 1 tablet (50 mg total) by mouth 2 (two) times daily.   MIRTAZAPINE (REMERON) 7.5 MG TABLET    Take 7.5 mg by mouth at bedtime.   MULTIPLE VITAMINS-MINERALS (CENTRUM PO)    Take 1 tablet by mouth 1 day or 1 dose.   NUTRITIONAL SUPPLEMENTS (RESOURCE PO)    Take 120 mLs by mouth 2 (two) times daily. With med pass.   OMEPRAZOLE (PRILOSEC) 20 MG CAPSULE    Take 20 mg by mouth daily.   POLYETHYLENE GLYCOL (MIRALAX / GLYCOLAX) PACKET  Take 17 g by mouth. Fill cap to 17 gm mark, mix with 4 oz of fluid and take by mouth every other day.  Modified Medications   No medications on file  Discontinued Medications   No medications on file     Physical Exam: Physical Exam  Constitutional: She appears well-developed and well-nourished. No distress.  HENT:  Head: Normocephalic and atraumatic.  Nose: Nose normal.  Mouth/Throat: Oropharynx is clear and moist. No oropharyngeal exudate.  Eyes: Conjunctivae and EOM are normal. Pupils are equal, round, and reactive to light. Right eye exhibits no discharge. Left eye exhibits no discharge. No scleral icterus.  Neck: Normal range of motion. Neck supple. No JVD present. No thyromegaly present.  Cardiovascular: Normal rate and regular rhythm.   Murmur heard. Systolic murmur appreciated at the left sternal border 2/6  Pulmonary/Chest: Effort normal and breath sounds normal. No respiratory distress. She has no wheezes. She has no rales.  Abdominal: Soft. Bowel sounds are normal. She exhibits no distension. There is no tenderness.     Musculoskeletal: Normal range of motion. She exhibits tenderness. She exhibits no edema.  Pain in the right shoulder/shoulder blade, neck, middle/lower back-gait instability and frequent  falling. C/o aches allover.   Lymphadenopathy:    She has no cervical adenopathy.  Neurological: She is alert. She has normal reflexes. No cranial nerve deficit. She exhibits normal muscle tone. Coordination normal.  Skin: Skin is warm and dry. No rash noted. She is not diaphoretic. No erythema.  Stage II pressure ulcer right buttock-healed 07/12/14. Mobile, smooth, a large marble size, non-tender at left breast 7 o'clock  Psychiatric: Her mood appears not anxious. Her affect is inappropriate. Her affect is not angry, not blunt and not labile. Her speech is delayed. Her speech is not rapid and/or pressured, not tangential and not slurred. She is slowed. She is not agitated, not aggressive, not hyperactive, not withdrawn and not actively hallucinating. Thought content is not paranoid and not delusional. Cognition and memory are impaired. She expresses impulsivity and inappropriate judgment. She does not exhibit a depressed mood. She is communicative. She exhibits abnormal recent memory. She exhibits normal remote memory. She is attentive.    Filed Vitals:   11/28/14 1612  BP: 130/70  Pulse: 82  Temp: 97.2 F (36.2 C)  TempSrc: Tympanic  Resp: 20      Labs reviewed: Basic Metabolic Panel:  Recent Labs  16/10/96 07/03/14 10/02/14  NA 138 144 141  K 4.5 3.7 3.9  BUN 33* 26* 29*  CREATININE 0.8 0.7 0.8  TSH  --   --  0.90    CBC:     Component Value Date/Time   WBC 6.1 10/02/2014   WBC 6.0 05/28/2012 1205   RBC 4.38 05/28/2012 1205   HGB 11.9* 10/02/2014   HCT 37 10/02/2014   PLT 130* 10/02/2014   MCV 90.9 05/28/2012 1205   MCH 29.9 05/28/2012 1205   MCHC 32.9 05/28/2012 1205   RDW 12.9 05/28/2012 1205   LYMPHSABS 1.4 05/28/2012 1205   MONOABS 0.6 05/28/2012 1205   EOSABS 0.1 05/28/2012 1205   BASOSABS 0.0 05/28/2012 1205   Assessment/Plan OSTEOARTHROSIS, GENERALIZED, MULTIPLE SITES Pain is reasonably controlled especially left hip-but still c/o aches allover,  ambulates with walker on unit since Norco qam, increase Norco 5/325mg  to yif and q4h prn, and Tylenol  q6 prn. Observe.   Left hip pain Sustained from fall: 06/01/14 X-ray L hip & pelvis: no acute osseous abnormality.  Worsened left hip  pain sustained from falling 10/27/14-pending X-ray 11/28/14 better pain controlled.     Hypertension Controlled on Amlodipine 5mg  and Metoprolol 50mg  bid.      GERD (gastroesophageal reflux disease) Better since off Celebrex and continue Prilosec 20mg  daily. 07/07/14 Guaiac stools x3 negative.      Depression with anxiety Stable, takes Mirtazapine 7.5mg  nightly, mood is stable. Breast lumps may contribute to her gradual weight loss.     Dementia, vascular SNF for care. Continue Aricept and Namenda. MMSE 22/30 10/26/14    Constipation MiraLax prn and off Colace 100mg  bid 08/22/14. C/o frequent loose stools-Questran tid for now. 10/29/14 stable.  11/28/14 stable.   Breast lump on left side at 7 o'clock position Mobile, smooth, a large marble size, non-tender at left breast 7 o'clock-will schedule Mammogram to evaluate further.  07/05/14 POA declined Mammogram. Desires no IVF. ABT for comfort measures only. 11/28/14 seems larger in size, still mobile and non tender.     Family/ Staff Communication: observe the patient  Goals of Care: SNF  Labs/tests ordered: POA declined Mammogram+MRA for hx aortic dissected aneurysm. Xray left hip

## 2014-11-28 NOTE — Assessment & Plan Note (Signed)
Pain is reasonably controlled especially left hip-but still c/o aches allover, ambulates with walker on unit since Norco qam, increase Norco 5/325mg  to yif and q4h prn, and Tylenol 650mg  q6 prn. Observe.

## 2014-11-28 NOTE — Assessment & Plan Note (Signed)
Better since off Celebrex and continue Prilosec 20mg daily. 07/07/14 Guaiac stools x3 negative.     

## 2014-11-28 NOTE — Assessment & Plan Note (Signed)
SNF for care. Continue Aricept and Namenda. MMSE 22/30 10/26/14  

## 2014-11-28 NOTE — Assessment & Plan Note (Signed)
Stable, takes Mirtazapine 7.5mg nightly, mood is stable. Breast lumps may contribute to her gradual weight loss.    

## 2014-11-29 DIAGNOSIS — I10 Essential (primary) hypertension: Secondary | ICD-10-CM | POA: Diagnosis not present

## 2014-11-29 DIAGNOSIS — R2681 Unsteadiness on feet: Secondary | ICD-10-CM | POA: Diagnosis not present

## 2014-11-29 DIAGNOSIS — R296 Repeated falls: Secondary | ICD-10-CM | POA: Diagnosis not present

## 2014-11-29 DIAGNOSIS — M6281 Muscle weakness (generalized): Secondary | ICD-10-CM | POA: Diagnosis not present

## 2014-11-30 DIAGNOSIS — I10 Essential (primary) hypertension: Secondary | ICD-10-CM | POA: Diagnosis not present

## 2014-11-30 DIAGNOSIS — R2681 Unsteadiness on feet: Secondary | ICD-10-CM | POA: Diagnosis not present

## 2014-11-30 DIAGNOSIS — M6281 Muscle weakness (generalized): Secondary | ICD-10-CM | POA: Diagnosis not present

## 2014-11-30 DIAGNOSIS — R296 Repeated falls: Secondary | ICD-10-CM | POA: Diagnosis not present

## 2014-12-03 DIAGNOSIS — M6281 Muscle weakness (generalized): Secondary | ICD-10-CM | POA: Diagnosis not present

## 2014-12-03 DIAGNOSIS — R2681 Unsteadiness on feet: Secondary | ICD-10-CM | POA: Diagnosis not present

## 2014-12-03 DIAGNOSIS — R296 Repeated falls: Secondary | ICD-10-CM | POA: Diagnosis not present

## 2014-12-03 DIAGNOSIS — I10 Essential (primary) hypertension: Secondary | ICD-10-CM | POA: Diagnosis not present

## 2014-12-04 DIAGNOSIS — R2681 Unsteadiness on feet: Secondary | ICD-10-CM | POA: Diagnosis not present

## 2014-12-04 DIAGNOSIS — M6281 Muscle weakness (generalized): Secondary | ICD-10-CM | POA: Diagnosis not present

## 2014-12-04 DIAGNOSIS — R296 Repeated falls: Secondary | ICD-10-CM | POA: Diagnosis not present

## 2014-12-04 DIAGNOSIS — I10 Essential (primary) hypertension: Secondary | ICD-10-CM | POA: Diagnosis not present

## 2014-12-05 DIAGNOSIS — M6281 Muscle weakness (generalized): Secondary | ICD-10-CM | POA: Diagnosis not present

## 2014-12-05 DIAGNOSIS — R296 Repeated falls: Secondary | ICD-10-CM | POA: Diagnosis not present

## 2014-12-05 DIAGNOSIS — R2681 Unsteadiness on feet: Secondary | ICD-10-CM | POA: Diagnosis not present

## 2014-12-05 DIAGNOSIS — I10 Essential (primary) hypertension: Secondary | ICD-10-CM | POA: Diagnosis not present

## 2014-12-07 DIAGNOSIS — R2681 Unsteadiness on feet: Secondary | ICD-10-CM | POA: Diagnosis not present

## 2014-12-07 DIAGNOSIS — M6281 Muscle weakness (generalized): Secondary | ICD-10-CM | POA: Diagnosis not present

## 2014-12-07 DIAGNOSIS — I10 Essential (primary) hypertension: Secondary | ICD-10-CM | POA: Diagnosis not present

## 2014-12-07 DIAGNOSIS — R296 Repeated falls: Secondary | ICD-10-CM | POA: Diagnosis not present

## 2014-12-10 DIAGNOSIS — R296 Repeated falls: Secondary | ICD-10-CM | POA: Diagnosis not present

## 2014-12-10 DIAGNOSIS — R2681 Unsteadiness on feet: Secondary | ICD-10-CM | POA: Diagnosis not present

## 2014-12-10 DIAGNOSIS — I10 Essential (primary) hypertension: Secondary | ICD-10-CM | POA: Diagnosis not present

## 2014-12-10 DIAGNOSIS — M6281 Muscle weakness (generalized): Secondary | ICD-10-CM | POA: Diagnosis not present

## 2014-12-12 DIAGNOSIS — R2681 Unsteadiness on feet: Secondary | ICD-10-CM | POA: Diagnosis not present

## 2014-12-12 DIAGNOSIS — M6281 Muscle weakness (generalized): Secondary | ICD-10-CM | POA: Diagnosis not present

## 2014-12-12 DIAGNOSIS — R296 Repeated falls: Secondary | ICD-10-CM | POA: Diagnosis not present

## 2014-12-12 DIAGNOSIS — I10 Essential (primary) hypertension: Secondary | ICD-10-CM | POA: Diagnosis not present

## 2014-12-13 DIAGNOSIS — I10 Essential (primary) hypertension: Secondary | ICD-10-CM | POA: Diagnosis not present

## 2014-12-13 DIAGNOSIS — M6281 Muscle weakness (generalized): Secondary | ICD-10-CM | POA: Diagnosis not present

## 2014-12-13 DIAGNOSIS — R2681 Unsteadiness on feet: Secondary | ICD-10-CM | POA: Diagnosis not present

## 2014-12-13 DIAGNOSIS — R296 Repeated falls: Secondary | ICD-10-CM | POA: Diagnosis not present

## 2014-12-17 DIAGNOSIS — R296 Repeated falls: Secondary | ICD-10-CM | POA: Diagnosis not present

## 2014-12-17 DIAGNOSIS — I10 Essential (primary) hypertension: Secondary | ICD-10-CM | POA: Diagnosis not present

## 2014-12-17 DIAGNOSIS — M6281 Muscle weakness (generalized): Secondary | ICD-10-CM | POA: Diagnosis not present

## 2014-12-17 DIAGNOSIS — R2681 Unsteadiness on feet: Secondary | ICD-10-CM | POA: Diagnosis not present

## 2014-12-26 ENCOUNTER — Non-Acute Institutional Stay (SKILLED_NURSING_FACILITY): Payer: Medicare Other | Admitting: Nurse Practitioner

## 2014-12-26 DIAGNOSIS — F418 Other specified anxiety disorders: Secondary | ICD-10-CM

## 2014-12-26 DIAGNOSIS — N63 Unspecified lump in breast: Secondary | ICD-10-CM | POA: Diagnosis not present

## 2014-12-26 DIAGNOSIS — K219 Gastro-esophageal reflux disease without esophagitis: Secondary | ICD-10-CM | POA: Diagnosis not present

## 2014-12-26 DIAGNOSIS — F0150 Vascular dementia without behavioral disturbance: Secondary | ICD-10-CM

## 2014-12-26 DIAGNOSIS — I10 Essential (primary) hypertension: Secondary | ICD-10-CM | POA: Diagnosis not present

## 2014-12-26 DIAGNOSIS — N6324 Unspecified lump in the left breast, lower inner quadrant: Secondary | ICD-10-CM

## 2014-12-26 DIAGNOSIS — K589 Irritable bowel syndrome without diarrhea: Secondary | ICD-10-CM | POA: Diagnosis not present

## 2014-12-26 DIAGNOSIS — M546 Pain in thoracic spine: Secondary | ICD-10-CM

## 2014-12-26 NOTE — Assessment & Plan Note (Signed)
Better since off Celebrex and continue Prilosec 20mg daily. 07/07/14 Guaiac stools x3 negative.     

## 2014-12-26 NOTE — Assessment & Plan Note (Signed)
SNF for care. Continue Aricept and Namenda. MMSE 22/30 10/26/14  

## 2014-12-26 NOTE — Assessment & Plan Note (Signed)
Prn MiraLax is adequate.  

## 2014-12-26 NOTE — Assessment & Plan Note (Signed)
Controlled on Amlodipine 5mg and Metoprolol 50mg bid.     

## 2014-12-26 NOTE — Assessment & Plan Note (Signed)
Managed with Questran tid.

## 2014-12-26 NOTE — Assessment & Plan Note (Signed)
Stable, takes Mirtazapine 7.5mg nightly, mood is stable. Breast lumps may contribute to her gradual weight loss.    

## 2014-12-26 NOTE — Assessment & Plan Note (Signed)
Mobile, smooth, a large marble size, non-tender at left breast 7 o'clock-will schedule Mammogram to evaluate further.  07/05/14 POA declined Mammogram. Desires no IVF. ABT for comfort measures only. --still mobile and non tender.    

## 2014-12-26 NOTE — Progress Notes (Signed)
Patient ID: Jamie [name], female   DOB: 1920/06/04, 81 y.o.   MRN: [id number]   Code Status: DNR  Allergies  Allergen Reactions  . Alendronate Sodium     REACTION: GI    Chief Complaint  Patient presents with  . Medical Management of Chronic Issues    HPI: Patient is a 81 y.o. female seen in the SNF at [facility] today for evaluation of chronic medical conditions.  Problem List Items Addressed This Visit    Thoracic back pain    Hx of c/o  pain in her right shoulder and shoulder blade(no decreased ROM of the right shoulder) and lower back  Hx of middle of her spine vertebral body got out of alignment from falling on staircase in 1950--chronic on and off pain since the event. Chronic pain is managed with Norco bid started 04/04/14. Prn Norco is still available to her.  12/26/14 better controlled with Norco tid.          IRRITABLE BOWEL SYNDROME    Managed with Questran tid.       Hypertension - Primary    Controlled on Amlodipine  and Metoprolol  bid.         GERD (gastroesophageal reflux disease)    Better since off Celebrex and continue Prilosec  daily. 07/07/14 Guaiac stools x3 negative.           Depression with anxiety    Stable, takes Mirtazapine 7.5mg  nightly, mood is stable. Breast lumps may contribute to her gradual weight loss.          Dementia, vascular    SNF for care. Continue Aricept and Namenda. MMSE 22/30 10/26/14        Breast lump on left side at 7 o'clock position    Mobile, smooth, a large marble size, non-tender at left breast 7 o'clock-will schedule Mammogram to evaluate further.  07/05/14 POA declined Mammogram. Desires no IVF. ABT for comfort measures only. --still mobile and non tender.           Review of Systems:  Review of Systems  Constitutional: Positive for weight loss. Negative for fever, chills, malaise/fatigue and diaphoresis.  HENT: Positive for hearing loss. Negative for congestion, ear discharge,  ear pain, nosebleeds, sore throat and tinnitus.   Eyes: Negative for blurred vision, double vision, photophobia, pain, discharge and redness.  Respiratory: Negative for cough, hemoptysis, sputum production, shortness of breath, wheezing and stridor.   Cardiovascular: Negative for chest pain, palpitations, orthopnea, claudication, leg swelling and PND.  Gastrointestinal: Negative for heartburn, nausea, vomiting, abdominal pain, diarrhea, constipation, blood in stool and melena.  Genitourinary: Positive for frequency. Negative for dysuria, urgency, hematuria and flank pain.       Left breast nodule.   Musculoskeletal: Positive for back pain and joint pain. Negative for myalgias, falls and neck pain.  Skin: Negative for itching and rash.  Neurological: Negative for dizziness, tingling, tremors, sensory change, speech change, focal weakness, seizures, loss of consciousness, weakness and headaches.  Endo/Heme/Allergies: Negative for environmental allergies and polydipsia. Does not bruise/bleed easily.  Psychiatric/Behavioral: Positive for depression and memory loss. Negative for suicidal ideas, hallucinations and substance abuse. The patient is not nervous/anxious and does not have insomnia.      Past Medical History  Diagnosis Date  . Osteoporosis   . Allergic rhinitis   . Dementia   . Osteoarthritis     hands  . IBS (irritable bowel syndrome)   . GERD (gastroesophageal reflux disease)   .  Hypertension   . Palpitation     a. PAC's & PVC's by Holter - 2011  . Tortuous colon 2002  . Descending thoracic aortic dissection 04/24/2010    a. MRA 2011 - begins distal to left subclavian and extends throughout the full length of the Ao.  Marland Kitchen Aortic insufficiency 04/16/2010    a. Echo 2011 - mild to moderate AI, EF 60%.  . Anemia, unspecified 01/12/2013  . Other abnormal blood chemistry 01/12/2013  . Lumbago 10/06/2012  . Personal history of fall 10/06/2012  . Hyposmolality and/or hyponatremia  09/29/2012  . Otitis externa 09/29/2012  . Impacted cerumen 09/29/2012  . Unspecified conductive hearing loss 09/29/2012  . External hemorrhoids without mention of complication 09/29/2012  . Unspecified venous (peripheral) insufficiency 09/29/2012  . Unspecified constipation 09/29/2012  . Abnormality of gait 09/29/2012  . Congestive heart failure, unspecified 06/30/2003   Past Surgical History  Procedure Laterality Date  . Dilation and curettage of uterus  1960  . Cataract extraction w/ intraocular lens implant     Social History:   reports that she has never smoked. She has never used smokeless tobacco. She reports that she does not drink alcohol or use illicit drugs.  Family History  Problem Relation Age of Onset  . Diabetes Mother   . Coronary artery disease Father   . Hypertension Father   . Diabetes      sisters    Medications: Patient's Medications  New Prescriptions   No medications on file  Previous Medications   ACETAMINOPHEN (TYLENOL) 325 MG TABLET    Take 650 mg by mouth every 6 (six) hours as needed.    AMLODIPINE (NORVASC) 5 MG TABLET    Take 1 tablet (5 mg total) by mouth daily.   ARTIFICIAL TEAR SOLUTION (TEARS NATURALE II OP)    Apply 1 drop to eye daily. Both eyes twice daily   AZELASTINE (ASTELIN) 137 MCG/SPRAY NASAL SPRAY    Place 2 sprays into the nose. At bedtime   CALCIUM CITRATE-VITAMIN D (CITRACAL+D) 315-200 MG-UNIT PER TABLET    Take 2 tablets by mouth. Take twice daily   CHOLECALCIFEROL (VITAMIN D) 2000 UNITS CAPS    Take by mouth daily.     DOCUSATE SODIUM (COLACE) 100 MG CAPSULE    Take 100 mg by mouth 2 (two) times daily.   DONEPEZIL (ARICEPT) 10 MG TABLET    Take 1 tablet (10 mg total) by mouth daily.   HYDROCODONE-ACETAMINOPHEN (NORCO/VICODIN) 5-325 MG PER TABLET    Take one tablet by mouth once daily for pain   MEMANTINE (NAMENDA) 10 MG TABLET    Take 28 mg by mouth daily.    METOPROLOL (LOPRESSOR) 50 MG TABLET    Take 1 tablet (50 mg total) by mouth 2  (two) times daily.   MIRTAZAPINE (REMERON) 7.5 MG TABLET    Take 7.5 mg by mouth at bedtime.   MULTIPLE VITAMINS-MINERALS (CENTRUM PO)    Take 1 tablet by mouth 1 day or 1 dose.   NUTRITIONAL SUPPLEMENTS (RESOURCE PO)    Take 120 mLs by mouth 2 (two) times daily. With med pass.   OMEPRAZOLE (PRILOSEC) 20 MG CAPSULE    Take 20 mg by mouth daily.   POLYETHYLENE GLYCOL (MIRALAX / GLYCOLAX) PACKET    Take 17 g by mouth. Fill cap to 17 gm mark, mix with 4 oz of fluid and take by mouth every other day.  Modified Medications   No medications on file  Discontinued Medications  No medications on file     Physical Exam: Physical Exam  Constitutional: She appears well-developed and well-nourished. No distress.  HENT:  Head: Normocephalic and atraumatic.  Nose: Nose normal.  Mouth/Throat: Oropharynx is clear and moist. No oropharyngeal exudate.  Eyes: Conjunctivae and EOM are normal. Pupils are equal, round, and reactive to light. Right eye exhibits no discharge. Left eye exhibits no discharge. No scleral icterus.  Neck: Normal range of motion. Neck supple. No JVD present. No thyromegaly present.  Cardiovascular: Normal rate and regular rhythm.   Murmur heard. Systolic murmur appreciated at the left sternal border 2/6  Pulmonary/Chest: Effort normal and breath sounds normal. No respiratory distress. She has no wheezes. She has no rales.  Abdominal: Soft. Bowel sounds are normal. She exhibits no distension. There is no tenderness.     Musculoskeletal: Normal range of motion. She exhibits tenderness. She exhibits no edema.  Pain in the right shoulder/shoulder blade, neck, middle/lower back-gait instability and frequent falling. C/o aches allover-better since Norco tid. Scoliokyphosis.   Lymphadenopathy:    She has no cervical adenopathy.  Neurological: She is alert. She has normal reflexes. No cranial nerve deficit. She exhibits normal muscle tone. Coordination normal.  Skin: Skin is warm and  dry. No rash noted. She is not diaphoretic. No erythema.  Mobile, smooth, a large marble size, non-tender at left breast 7 o'clock  Psychiatric: Her mood appears not anxious. Her affect is inappropriate. Her affect is not angry, not blunt and not labile. Her speech is delayed. Her speech is not rapid and/or pressured, not tangential and not slurred. She is slowed. She is not agitated, not aggressive, not hyperactive, not withdrawn and not actively hallucinating. Thought content is not paranoid and not delusional. Cognition and memory are impaired. She expresses impulsivity and inappropriate judgment. She does not exhibit a depressed mood. She is communicative. She exhibits abnormal recent memory. She exhibits normal remote memory. She is attentive.    Filed Vitals:   12/27/14 1342  BP: 118/74  Pulse: 66  Temp: 97.9 F (36.6 C)  TempSrc: Tympanic  Resp: 16      Labs reviewed: Basic Metabolic Panel:  Recent Labs  16/10/96 07/03/14 10/02/14  NA 138 144 141  K 4.5 3.7 3.9  BUN 33* 26* 29*  CREATININE 0.8 0.7 0.8  TSH  --   --  0.90    CBC:     Component Value Date/Time   WBC 6.1 10/02/2014   WBC 6.0 05/28/2012 1205   RBC 4.38 05/28/2012 1205   HGB 11.9* 10/02/2014   HCT 37 10/02/2014   PLT 130* 10/02/2014   MCV 90.9 05/28/2012 1205   MCH 29.9 05/28/2012 1205   MCHC 32.9 05/28/2012 1205   RDW 12.9 05/28/2012 1205   LYMPHSABS 1.4 05/28/2012 1205   MONOABS 0.6 05/28/2012 1205   EOSABS 0.1 05/28/2012 1205   BASOSABS 0.0 05/28/2012 1205   Assessment/Plan Dementia, vascular SNF for care. Continue Aricept and Namenda. MMSE 22/30 10/26/14     Hypertension Controlled on Amlodipine  and Metoprolol  bid.      GERD (gastroesophageal reflux disease) Better since off Celebrex and continue Prilosec  daily. 07/07/14 Guaiac stools x3 negative.        IRRITABLE BOWEL SYNDROME Managed with Questran tid.    Thoracic back pain Hx of c/o  pain in her right  shoulder and shoulder blade(no decreased ROM of the right shoulder) and lower back  Hx of middle of her spine vertebral body got out  of alignment from falling on staircase in 1950--chronic on and off pain since the event. Chronic pain is managed with Norco bid started 04/04/14. Prn Norco is still available to her.  12/26/14 better controlled with Norco tid.       Depression with anxiety Stable, takes Mirtazapine 7.5mg  nightly, mood is stable. Breast lumps may contribute to her gradual weight loss.       Breast lump on left side at 7 o'clock position Mobile, smooth, a large marble size, non-tender at left breast 7 o'clock-will schedule Mammogram to evaluate further.  07/05/14 POA declined Mammogram. Desires no IVF. ABT for comfort measures only. --still mobile and non tender.     Unspecified constipation Prn MiraLax is adequate.      Family/ Staff Communication: observe the patient  Goals of Care: SNF  Labs/tests ordered: none

## 2014-12-26 NOTE — Assessment & Plan Note (Signed)
Hx of c/o  pain in her right shoulder and shoulder blade(no decreased ROM of the right shoulder) and lower back  Hx of middle of her spine vertebral body got out of alignment from falling on staircase in 1950--chronic on and off pain since the event. Chronic pain is managed with Norco bid started 04/04/14. Prn Norco is still available to her.  12/26/14 better controlled with Norco tid.

## 2015-01-24 ENCOUNTER — Encounter: Payer: Self-pay | Admitting: Nurse Practitioner

## 2015-01-24 ENCOUNTER — Non-Acute Institutional Stay (SKILLED_NURSING_FACILITY): Payer: Medicare Other | Admitting: Nurse Practitioner

## 2015-01-24 DIAGNOSIS — M546 Pain in thoracic spine: Secondary | ICD-10-CM

## 2015-01-24 DIAGNOSIS — F418 Other specified anxiety disorders: Secondary | ICD-10-CM | POA: Diagnosis not present

## 2015-01-24 DIAGNOSIS — K219 Gastro-esophageal reflux disease without esophagitis: Secondary | ICD-10-CM

## 2015-01-24 DIAGNOSIS — F0150 Vascular dementia without behavioral disturbance: Secondary | ICD-10-CM

## 2015-01-24 DIAGNOSIS — K5900 Constipation, unspecified: Secondary | ICD-10-CM

## 2015-01-24 DIAGNOSIS — R634 Abnormal weight loss: Secondary | ICD-10-CM

## 2015-01-24 DIAGNOSIS — I10 Essential (primary) hypertension: Secondary | ICD-10-CM | POA: Diagnosis not present

## 2015-01-24 NOTE — Progress Notes (Signed)
This encounter was created in error - please disregard.

## 2015-01-24 NOTE — Assessment & Plan Note (Signed)
Stabilized Continue Mirtazapine 

## 2015-01-24 NOTE — Assessment & Plan Note (Signed)
Better since off Celebrex and continue Prilosec 20mg daily. 07/07/14 Guaiac stools x3 negative.     

## 2015-01-24 NOTE — Progress Notes (Signed)
Patient ID: DACI STUBBE, female   DOB: [birth year], 81 y.o.   MRN: [id number]   Code Status: DNR  Allergies  Allergen Reactions  . Alendronate Sodium     REACTION: GI    Chief Complaint  Patient presents with  . Medical Management of Chronic Issues    HPI: Patient is a 81 y.o. female seen in the SNF at [facility] today for evaluation of chronic medical conditions.  Problem List Items Addressed This Visit    Thoracic back pain    Hx of c/o  pain in her right shoulder and shoulder blade(no decreased ROM of the right shoulder) and lower back  Hx of middle of her spine vertebral body got out of alignment from falling on staircase in 1950--chronic on and off pain since the event. Chronic pain is managed with Norco bid started 04/04/14. Prn Norco is still available to her.  --better controlled with Norco tid.         Loss of weight    Stabilized. Continue Mirtazapine.       Hypertension - Primary    Controlled on Amlodipine 5mg  and Metoprolol 50mg  bid.        GERD (gastroesophageal reflux disease)    Better since off Celebrex and continue Prilosec 20mg  daily. 07/07/14 Guaiac stools x3 negative.         Depression with anxiety    Stable, takes Mirtazapine 7.5mg  nightly, mood is stable. Breast lumps may contribute to her gradual weight loss.         Dementia, vascular    SNF for care. Continue Aricept and Namenda. MMSE 22/30 10/26/14        Constipation    MiraLax prn and off Colace 100mg  bid 08/22/14. C/o frequent loose stools-Questran tid for now. Hx of IBS --stable          Review of Systems:  Review of Systems  Constitutional: Negative for fever, chills, weight loss, malaise/fatigue and diaphoresis.  HENT: Positive for hearing loss. Negative for congestion, ear discharge, ear pain, nosebleeds, sore throat and tinnitus.   Eyes: Negative for blurred vision, double vision, photophobia, pain, discharge and redness.  Respiratory: Negative for cough,  hemoptysis, sputum production, shortness of breath, wheezing and stridor.   Cardiovascular: Negative for chest pain, palpitations, orthopnea, claudication, leg swelling and PND.  Gastrointestinal: Negative for heartburn, nausea, vomiting, abdominal pain, diarrhea, constipation, blood in stool and melena.  Genitourinary: Positive for frequency. Negative for dysuria, urgency, hematuria and flank pain.  Musculoskeletal: Positive for back pain and joint pain. Negative for myalgias, falls and neck pain.  Skin: Negative for itching and rash.       Left breast mass  Neurological: Negative for dizziness, tingling, tremors, sensory change, speech change, focal weakness, seizures, loss of consciousness, weakness and headaches.  Endo/Heme/Allergies: Negative for environmental allergies and polydipsia. Does not bruise/bleed easily.  Psychiatric/Behavioral: Positive for memory loss. Negative for depression, suicidal ideas, hallucinations and substance abuse. The patient is not nervous/anxious and does not have insomnia.      Past Medical History  Diagnosis Date  . Osteoporosis   . Allergic rhinitis   . Dementia   . Osteoarthritis     hands  . IBS (irritable bowel syndrome)   . GERD (gastroesophageal reflux disease)   . Hypertension   . Palpitation     a. PAC's & PVC's by Holter - 2011  . Tortuous colon 2002  . Descending thoracic aortic dissection 04/24/2010    a. MRA 2011 -  begins distal to left subclavian and extends throughout the full length of the Ao.  Marland Kitchen. Aortic insufficiency 04/16/2010    a. Echo 2011 - mild to moderate AI, EF 60%.  . Anemia, unspecified 01/12/2013  . Other abnormal blood chemistry 01/12/2013  . Lumbago 10/06/2012  . Personal history of fall 10/06/2012  . Hyposmolality and/or hyponatremia 09/29/2012  . Otitis externa 09/29/2012  . Impacted cerumen 09/29/2012  . Unspecified conductive hearing loss 09/29/2012  . External hemorrhoids without mention of complication 09/29/2012  .  Unspecified venous (peripheral) insufficiency 09/29/2012  . Unspecified constipation 09/29/2012  . Abnormality of gait 09/29/2012  . Congestive heart failure, unspecified 06/30/2003   Past Surgical History  Procedure Laterality Date  . Dilation and curettage of uterus  1960  . Cataract extraction w/ intraocular lens implant     Social History:   reports that she has never smoked. She has never used smokeless tobacco. She reports that she does not drink alcohol or use illicit drugs.  Family History  Problem Relation Age of Onset  . Diabetes Mother   . Coronary artery disease Father   . Hypertension Father   . Diabetes      sisters    Medications: Patient's Medications  New Prescriptions   No medications on file  Previous Medications   ACETAMINOPHEN (TYLENOL) 325 MG TABLET    Take 650 mg by mouth every 6 (six) hours as needed.    AMLODIPINE (NORVASC) 5 MG TABLET    Take 1 tablet (5 mg total) by mouth daily.   ARTIFICIAL TEAR SOLUTION (TEARS NATURALE II OP)    Apply 1 drop to eye daily. Both eyes twice daily   AZELASTINE (ASTELIN) 137 MCG/SPRAY NASAL SPRAY    Place 2 sprays into the nose. At bedtime   CALCIUM CITRATE-VITAMIN D (CITRACAL+D) 315-200 MG-UNIT PER TABLET    Take 2 tablets by mouth. Take twice daily   CHOLECALCIFEROL (VITAMIN D) 2000 UNITS CAPS    Take by mouth daily.     DOCUSATE SODIUM (COLACE) 100 MG CAPSULE    Take 100 mg by mouth 2 (two) times daily.   DONEPEZIL (ARICEPT) 10 MG TABLET    Take 1 tablet (10 mg total) by mouth daily.   HYDROCODONE-ACETAMINOPHEN (NORCO/VICODIN) 5-325 MG PER TABLET    Take one tablet by mouth once daily for pain   MEMANTINE (NAMENDA) 10 MG TABLET    Take 28 mg by mouth daily.    METOPROLOL (LOPRESSOR) 50 MG TABLET    Take 1 tablet (50 mg total) by mouth 2 (two) times daily.   MIRTAZAPINE (REMERON) 7.5 MG TABLET    Take 7.5 mg by mouth at bedtime.   MULTIPLE VITAMINS-MINERALS (CENTRUM PO)    Take 1 tablet by mouth 1 day or 1 dose.    NUTRITIONAL SUPPLEMENTS (RESOURCE PO)    Take 120 mLs by mouth 2 (two) times daily. With med pass.   OMEPRAZOLE (PRILOSEC) 20 MG CAPSULE    Take 20 mg by mouth daily.   POLYETHYLENE GLYCOL (MIRALAX / GLYCOLAX) PACKET    Take 17 g by mouth. Fill cap to 17 gm mark, mix with 4 oz of fluid and take by mouth every other day.  Modified Medications   No medications on file  Discontinued Medications   No medications on file     Physical Exam: Physical Exam  Constitutional: She appears well-developed and well-nourished. No distress.  HENT:  Head: Normocephalic and atraumatic.  Nose: Nose normal.  Mouth/Throat: Oropharynx  is clear and moist. No oropharyngeal exudate.  Eyes: Conjunctivae and EOM are normal. Pupils are equal, round, and reactive to light. Right eye exhibits no discharge. Left eye exhibits no discharge. No scleral icterus.  Neck: Normal range of motion. Neck supple. No JVD present. No thyromegaly present.  Cardiovascular: Normal rate and regular rhythm.   Murmur heard. Systolic murmur appreciated at the left sternal border 2/6  Pulmonary/Chest: Effort normal and breath sounds normal. No respiratory distress. She has no wheezes. She has no rales.  Abdominal: Soft. Bowel sounds are normal. She exhibits no distension. There is no tenderness.     Musculoskeletal: Normal range of motion. She exhibits tenderness. She exhibits no edema.  Pain in the right shoulder/shoulder blade, neck, middle/lower back-gait instability and frequent falling. C/o aches allover-better since Norco tid. Scoliokyphosis.   Lymphadenopathy:    She has no cervical adenopathy.  Neurological: She is alert. She has normal reflexes. No cranial nerve deficit. She exhibits normal muscle tone. Coordination normal.  Skin: Skin is warm and dry. No rash noted. She is not diaphoretic. No erythema.  Mobile, smooth, a large marble size, non-tender at left breast 7 o'clock  Psychiatric: Her mood appears not anxious. Her  affect is inappropriate. Her affect is not angry, not blunt and not labile. Her speech is delayed. Her speech is not rapid and/or pressured, not tangential and not slurred. She is slowed. She is not agitated, not aggressive, not hyperactive, not withdrawn and not actively hallucinating. Thought content is not paranoid and not delusional. Cognition and memory are impaired. She expresses impulsivity and inappropriate judgment. She does not exhibit a depressed mood. She is communicative. She exhibits abnormal recent memory. She exhibits normal remote memory. She is attentive.    Filed Vitals:   01/24/15 1514  BP: 130/80  Pulse: 78  Temp: 98 F (36.7 C)  TempSrc: Tympanic  Resp: 20      Labs reviewed: Basic Metabolic Panel:  Recent Labs  16/10/96 07/03/14 10/02/14  NA 138 144 141  K 4.5 3.7 3.9  BUN 33* 26* 29*  CREATININE 0.8 0.7 0.8  TSH  --   --  0.90    CBC:     Component Value Date/Time   WBC 6.1 10/02/2014   WBC 6.0 05/28/2012 1205   RBC 4.38 05/28/2012 1205   HGB 11.9* 10/02/2014   HCT 37 10/02/2014   PLT 130* 10/02/2014   MCV 90.9 05/28/2012 1205   MCH 29.9 05/28/2012 1205   MCHC 32.9 05/28/2012 1205   RDW 12.9 05/28/2012 1205   LYMPHSABS 1.4 05/28/2012 1205   MONOABS 0.6 05/28/2012 1205   EOSABS 0.1 05/28/2012 1205   BASOSABS 0.0 05/28/2012 1205   Assessment/Plan Hypertension Controlled on Amlodipine  and Metoprolol  bid.     GERD (gastroesophageal reflux disease) Better since off Celebrex and continue Prilosec  daily. 07/07/14 Guaiac stools x3 negative.      Dementia, vascular SNF for care. Continue Aricept and Namenda. MMSE 22/30 10/26/14     Constipation MiraLax prn and off Colace  bid 08/22/14. C/o frequent loose stools-Questran tid for now. Hx of IBS --stable    Thoracic back pain Hx of c/o  pain in her right shoulder and shoulder blade(no decreased ROM of the right shoulder) and lower back  Hx of middle of her spine  vertebral body got out of alignment from falling on staircase in 1950--chronic on and off pain since the event. Chronic pain is managed with Norco bid started 04/04/14. Prn  Norco is still available to her.  --better controlled with Norco tid.      Loss of weight Stabilized. Continue Mirtazapine.    Depression with anxiety Stable, takes Mirtazapine 7.5mg  nightly, mood is stable. Breast lumps may contribute to her gradual weight loss.        Family/ Staff Communication: observe the patient  Goals of Care: SNF  Labs/tests ordered: none

## 2015-01-24 NOTE — Assessment & Plan Note (Signed)
SNF for care. Continue Aricept and Namenda. MMSE 22/30 10/26/14  

## 2015-01-24 NOTE — Assessment & Plan Note (Signed)
Hx of c/o  pain in her right shoulder and shoulder blade(no decreased ROM of the right shoulder) and lower back  Hx of middle of her spine vertebral body got out of alignment from falling on staircase in 1950--chronic on and off pain since the event. Chronic pain is managed with Norco bid started 04/04/14. Prn Norco is still available to her.  --better controlled with Norco tid.  

## 2015-01-24 NOTE — Assessment & Plan Note (Signed)
Controlled on Amlodipine 5mg and Metoprolol 50mg bid.     

## 2015-01-24 NOTE — Assessment & Plan Note (Signed)
Stable, takes Mirtazapine 7.5mg nightly, mood is stable. Breast lumps may contribute to her gradual weight loss.    

## 2015-01-24 NOTE — Assessment & Plan Note (Addendum)
MiraLax prn and off Colace 100mg bid 08/22/14. C/o frequent loose stools-Questran tid for now. Hx of IBS --stable  

## 2015-02-25 ENCOUNTER — Encounter: Payer: Self-pay | Admitting: Nurse Practitioner

## 2015-02-25 ENCOUNTER — Non-Acute Institutional Stay (SKILLED_NURSING_FACILITY): Payer: Medicare Other | Admitting: Nurse Practitioner

## 2015-02-25 DIAGNOSIS — I10 Essential (primary) hypertension: Secondary | ICD-10-CM | POA: Diagnosis not present

## 2015-02-25 DIAGNOSIS — R634 Abnormal weight loss: Secondary | ICD-10-CM

## 2015-02-25 DIAGNOSIS — M159 Polyosteoarthritis, unspecified: Secondary | ICD-10-CM

## 2015-02-25 DIAGNOSIS — K219 Gastro-esophageal reflux disease without esophagitis: Secondary | ICD-10-CM

## 2015-02-25 DIAGNOSIS — N6324 Unspecified lump in the left breast, lower inner quadrant: Secondary | ICD-10-CM

## 2015-02-25 DIAGNOSIS — K5900 Constipation, unspecified: Secondary | ICD-10-CM | POA: Diagnosis not present

## 2015-02-25 DIAGNOSIS — M546 Pain in thoracic spine: Secondary | ICD-10-CM

## 2015-02-25 DIAGNOSIS — F418 Other specified anxiety disorders: Secondary | ICD-10-CM | POA: Diagnosis not present

## 2015-02-25 DIAGNOSIS — N63 Unspecified lump in breast: Secondary | ICD-10-CM

## 2015-02-25 DIAGNOSIS — F0150 Vascular dementia without behavioral disturbance: Secondary | ICD-10-CM

## 2015-02-25 NOTE — Assessment & Plan Note (Signed)
Hx of c/o  pain in her right shoulder and shoulder blade(no decreased ROM of the right shoulder) and lower back  Hx of middle of her spine vertebral body got out of alignment from falling on staircase in 1950--chronic on and off pain since the event. Chronic pain is managed with Norco bid started 04/04/14. Prn Norco is still available to her.  --better controlled with Norco tid.  

## 2015-02-25 NOTE — Assessment & Plan Note (Signed)
Gradual weight loss

## 2015-02-25 NOTE — Assessment & Plan Note (Signed)
Stable, takes Mirtazapine 7.5mg nightly, mood is stable. Breast lumps may contribute to her gradual weight loss.    

## 2015-02-25 NOTE — Assessment & Plan Note (Signed)
Better since off Celebrex and continue Prilosec 20mg daily. 07/07/14 Guaiac stools x3 negative.     

## 2015-02-25 NOTE — Progress Notes (Signed)
Patient ID: Jamie [name], female   DOB: [birth year], 81 y.o.   MRN: [id number]   Code Status: DNR  Allergies  Allergen Reactions  . Alendronate Sodium     REACTION: GI    Chief Complaint  Patient presents with  . Medical Management of Chronic Issues    HPI: Patient is a 81 y.o. female seen in the SNF at [facility] today for evaluation of chronic medical conditions.  Problem List Items Addressed This Visit    Dementia, vascular - Primary    SNF for care. Continue Aricept and Namenda. MMSE 22/30 10/26/14         OSTEOARTHROSIS, GENERALIZED, MULTIPLE SITES    Hx of c/o  pain in her right shoulder and shoulder blade(no decreased ROM of the right shoulder) and lower back  Hx of middle of her spine vertebral body got out of alignment from falling on staircase in 1950--chronic on and off pain since the event. Chronic pain is managed with Norco bid started 04/04/14. Prn Norco is still available to her.  --better controlled with Norco tid.         Constipation    MiraLax prn and off Colace 100mg  bid 08/22/14. C/o frequent loose stools-Questran tid for now. Hx of IBS --stable        Thoracic back pain    Hx of c/o  pain in her right shoulder and shoulder blade(no decreased ROM of the right shoulder) and lower back  Hx of middle of her spine vertebral body got out of alignment from falling on staircase in 1950--chronic on and off pain since the event. Chronic pain is managed with Norco bid started 04/04/14. Prn Norco is still available to her.  --better controlled with Norco tid.       Hypertension    Controlled on Amlodipine 5mg  and Metoprolol 50mg  bid.         GERD (gastroesophageal reflux disease)    Better since off Celebrex and continue Prilosec 20mg  daily. 07/07/14 Guaiac stools x3 negative.         Depression with anxiety    Stable, takes Mirtazapine 7.5mg  nightly, mood is stable. Breast lumps may contribute to her gradual weight loss.         Breast lump  on left side at 7 o'clock position    Mobile, smooth, a large marble size, non-tender at left breast 7 o'clock-will schedule Mammogram to evaluate further.  07/05/14 POA declined Mammogram. Desires no IVF. ABT for comfort measures only. --still mobile and non tender.        Loss of weight    Gradual weight loss         Review of Systems:  Review of Systems  Constitutional: Negative for fever, chills and diaphoresis.  HENT: Positive for hearing loss. Negative for congestion, ear discharge, ear pain, nosebleeds, sore throat and tinnitus.   Eyes: Negative for photophobia, pain, discharge and redness.  Respiratory: Negative for cough, shortness of breath, wheezing and stridor.   Cardiovascular: Negative for chest pain, palpitations and leg swelling.  Gastrointestinal: Negative for nausea, vomiting, abdominal pain, diarrhea, constipation and blood in stool.  Endocrine: Negative for polydipsia.  Genitourinary: Positive for frequency. Negative for dysuria, urgency, hematuria and flank pain.  Musculoskeletal: Positive for back pain. Negative for myalgias and neck pain.  Skin: Negative for rash.       Left breast mass  Allergic/Immunologic: Negative for environmental allergies.  Neurological: Negative for dizziness, tremors, seizures, weakness and headaches.  Hematological:  Does not bruise/bleed easily.  Psychiatric/Behavioral: Negative for suicidal ideas and hallucinations. The patient is not nervous/anxious.    Patient ID: Jamie [name], female   DOB: 12/28/1919, 81 y.o.   MRN: [id number]   Code Status: DNR  Allergies  Allergen Reactions  . Alendronate Sodium     REACTION: GI    Chief Complaint  Patient presents with  . Medical Management of Chronic Issues    HPI: Patient is a 81 y.o. female seen in the SNF at [facility] today for evaluation of chronic medical conditions.  Problem List Items Addressed This Visit    Dementia, vascular - Primary    SNF for care.  Continue Aricept and Namenda. MMSE 22/30 10/26/14         OSTEOARTHROSIS, GENERALIZED, MULTIPLE SITES    Hx of c/o  pain in her right shoulder and shoulder blade(no decreased ROM of the right shoulder) and lower back  Hx of middle of her spine vertebral body got out of alignment from falling on staircase in 1950--chronic on and off pain since the event. Chronic pain is managed with Norco bid started 04/04/14. Prn Norco is still available to her.  --better controlled with Norco tid.         Constipation    MiraLax prn and off Colace 100mg  bid 08/22/14. C/o frequent loose stools-Questran tid for now. Hx of IBS --stable        Thoracic back pain    Hx of c/o  pain in her right shoulder and shoulder blade(no decreased ROM of the right shoulder) and lower back  Hx of middle of her spine vertebral body got out of alignment from falling on staircase in 1950--chronic on and off pain since the event. Chronic pain is managed with Norco bid started 04/04/14. Prn Norco is still available to her.  --better controlled with Norco tid.       Hypertension    Controlled on Amlodipine 5mg  and Metoprolol 50mg  bid.         GERD (gastroesophageal reflux disease)    Better since off Celebrex and continue Prilosec 20mg  daily. 07/07/14 Guaiac stools x3 negative.         Depression with anxiety    Stable, takes Mirtazapine 7.5mg  nightly, mood is stable. Breast lumps may contribute to her gradual weight loss.         Breast lump on left side at 7 o'clock position    Mobile, smooth, a large marble size, non-tender at left breast 7 o'clock-will schedule Mammogram to evaluate further.  07/05/14 POA declined Mammogram. Desires no IVF. ABT for comfort measures only. --still mobile and non tender.        Loss of weight    Gradual weight loss         Review of Systems:  Review of Systems  Constitutional: Negative for fever, chills, weight loss, malaise/fatigue and diaphoresis.  HENT: Positive for  hearing loss. Negative for congestion, ear discharge, ear pain, nosebleeds, sore throat and tinnitus.   Eyes: Negative for blurred vision, double vision, photophobia, pain, discharge and redness.  Respiratory: Negative for cough, hemoptysis, sputum production, shortness of breath, wheezing and stridor.   Cardiovascular: Negative for chest pain, palpitations, orthopnea, claudication, leg swelling and PND.  Gastrointestinal: Negative for heartburn, nausea, vomiting, abdominal pain, diarrhea, constipation, blood in stool and melena.  Genitourinary: Positive for frequency. Negative for dysuria, urgency, hematuria and flank pain.  Musculoskeletal: Positive for back pain and joint pain. Negative for myalgias, falls  and neck pain.  Skin: Negative for itching and rash.       Left breast mass  Neurological: Negative for dizziness, tingling, tremors, sensory change, speech change, focal weakness, seizures, loss of consciousness, weakness and headaches.  Endo/Heme/Allergies: Negative for environmental allergies and polydipsia. Does not bruise/bleed easily.  Psychiatric/Behavioral: Positive for memory loss. Negative for depression, suicidal ideas, hallucinations and substance abuse. The patient is not nervous/anxious and does not have insomnia.      Past Medical History  Diagnosis Date  . Osteoporosis   . Allergic rhinitis   . Dementia   . Osteoarthritis     hands  . IBS (irritable bowel syndrome)   . GERD (gastroesophageal reflux disease)   . Hypertension   . Palpitation     a. PAC's & PVC's by Holter - 2011  . Tortuous colon 2002  . Descending thoracic aortic dissection 04/24/2010    a. MRA 2011 - begins distal to left subclavian and extends throughout the full length of the Ao.  Marland Kitchen Aortic insufficiency 04/16/2010    a. Echo 2011 - mild to moderate AI, EF 60%.  . Anemia, unspecified 01/12/2013  . Other abnormal blood chemistry 01/12/2013  . Lumbago 10/06/2012  . Personal history of fall 10/06/2012   . Hyposmolality and/or hyponatremia 09/29/2012  . Otitis externa 09/29/2012  . Impacted cerumen 09/29/2012  . Unspecified conductive hearing loss 09/29/2012  . External hemorrhoids without mention of complication 09/29/2012  . Unspecified venous (peripheral) insufficiency 09/29/2012  . Unspecified constipation 09/29/2012  . Abnormality of gait 09/29/2012  . Congestive heart failure, unspecified 06/30/2003   Past Surgical History  Procedure Laterality Date  . Dilation and curettage of uterus  1960  . Cataract extraction w/ intraocular lens implant     Social History:   reports that she has never smoked. She has never used smokeless tobacco. She reports that she does not drink alcohol or use illicit drugs.  Family History  Problem Relation Age of Onset  . Diabetes Mother   . Coronary artery disease Father   . Hypertension Father   . Diabetes      sisters    Medications: Patient's Medications  New Prescriptions   No medications on file  Previous Medications   ACETAMINOPHEN (TYLENOL) 325 MG TABLET    Take 650 mg by mouth every 6 (six) hours as needed.    AMLODIPINE (NORVASC) 5 MG TABLET    Take 1 tablet (5 mg total) by mouth daily.   ARTIFICIAL TEAR SOLUTION (TEARS NATURALE II OP)    Apply 1 drop to eye daily. Both eyes twice daily   AZELASTINE (ASTELIN) 137 MCG/SPRAY NASAL SPRAY    Place 2 sprays into the nose. At bedtime   CALCIUM CITRATE-VITAMIN D (CITRACAL+D) 315-200 MG-UNIT PER TABLET    Take 2 tablets by mouth. Take twice daily   CHOLECALCIFEROL (VITAMIN D) 2000 UNITS CAPS    Take by mouth daily.     DOCUSATE SODIUM (COLACE) 100 MG CAPSULE    Take 100 mg by mouth 2 (two) times daily.   DONEPEZIL (ARICEPT) 10 MG TABLET    Take 1 tablet (10 mg total) by mouth daily.   HYDROCODONE-ACETAMINOPHEN (NORCO/VICODIN) 5-325 MG PER TABLET    Take one tablet by mouth once daily for pain   MEMANTINE (NAMENDA) 10 MG TABLET    Take 28 mg by mouth daily.    METOPROLOL (LOPRESSOR) 50 MG TABLET     Take 1 tablet (50 mg total) by mouth 2 (  two) times daily.   MIRTAZAPINE (REMERON) 7.5 MG TABLET    Take 7.5 mg by mouth at bedtime.   MULTIPLE VITAMINS-MINERALS (CENTRUM PO)    Take 1 tablet by mouth 1 day or 1 dose.   NUTRITIONAL SUPPLEMENTS (RESOURCE PO)    Take 120 mLs by mouth 2 (two) times daily. With med pass.   OMEPRAZOLE (PRILOSEC) 20 MG CAPSULE    Take 20 mg by mouth daily.   POLYETHYLENE GLYCOL (MIRALAX / GLYCOLAX) PACKET    Take 17 g by mouth. Fill cap to 17 gm mark, mix with 4 oz of fluid and take by mouth every other day.  Modified Medications   No medications on file  Discontinued Medications   No medications on file     Physical Exam: Physical Exam  Constitutional: She appears well-developed and well-nourished. No distress.  HENT:  Head: Normocephalic and atraumatic.  Nose: Nose normal.  Mouth/Throat: Oropharynx is clear and moist. No oropharyngeal exudate.  Eyes: Conjunctivae and EOM are normal. Pupils are equal, round, and reactive to light. Right eye exhibits no discharge. Left eye exhibits no discharge. No scleral icterus.  Neck: Normal range of motion. Neck supple. No JVD present. No thyromegaly present.  Cardiovascular: Normal rate and regular rhythm.   Murmur heard. Systolic murmur appreciated at the left sternal border 2/6  Pulmonary/Chest: Effort normal and breath sounds normal. No respiratory distress. She has no wheezes. She has no rales.  Abdominal: Soft. Bowel sounds are normal. She exhibits no distension. There is no tenderness.     Musculoskeletal: Normal range of motion. She exhibits tenderness. She exhibits no edema.  Pain in the right shoulder/shoulder blade, neck, middle/lower back-gait instability and frequent falling. C/o aches allover-better since Norco tid. Scoliokyphosis.   Lymphadenopathy:    She has no cervical adenopathy.  Neurological: She is alert. She has normal reflexes. No cranial nerve deficit. She exhibits normal muscle tone.  Coordination normal.  Skin: Skin is warm and dry. No rash noted. She is not diaphoretic. No erythema.  Mobile, smooth, a large marble size, non-tender at left breast 7 o'clock  Psychiatric: Her mood appears not anxious. Her affect is inappropriate. Her affect is not angry, not blunt and not labile. Her speech is delayed. Her speech is not rapid and/or pressured, not tangential and not slurred. She is slowed. She is not agitated, not aggressive, not hyperactive, not withdrawn and not actively hallucinating. Thought content is not paranoid and not delusional. Cognition and memory are impaired. She expresses impulsivity and inappropriate judgment. She does not exhibit a depressed mood. She is communicative. She exhibits abnormal recent memory. She exhibits normal remote memory. She is attentive.    Filed Vitals:   02/25/15 1612  BP: 130/70  Pulse: 70  Temp: 97.6 F (36.4 C)  TempSrc: Tympanic  Resp: 20      Labs reviewed: Basic Metabolic Panel:  Recent Labs  40/98/1103/12/08 07/03/14 10/02/14  NA 138 144 141  K 4.5 3.7 3.9  BUN 33* 26* 29*  CREATININE 0.8 0.7 0.8  TSH  --   --  0.90    CBC:     Component Value Date/Time   WBC 6.1 10/02/2014   WBC 6.0 05/28/2012 1205   RBC 4.38 05/28/2012 1205   HGB 11.9* 10/02/2014   HCT 37 10/02/2014   PLT 130* 10/02/2014   MCV 90.9 05/28/2012 1205   MCH 29.9 05/28/2012 1205   MCHC 32.9 05/28/2012 1205   RDW 12.9 05/28/2012 1205   LYMPHSABS  1.4 05/28/2012 1205   MONOABS 0.6 05/28/2012 1205   EOSABS 0.1 05/28/2012 1205   BASOSABS 0.0 05/28/2012 1205   Assessment/Plan Dementia, vascular SNF for care. Continue Aricept and Namenda. MMSE 22/30 10/26/14      OSTEOARTHROSIS, GENERALIZED, MULTIPLE SITES Hx of c/o  pain in her right shoulder and shoulder blade(no decreased ROM of the right shoulder) and lower back  Hx of middle of her spine vertebral body got out of alignment from falling on staircase in 1950--chronic on and off pain since the  event. Chronic pain is managed with Norco bid started 04/04/14. Prn Norco is still available to her.  --better controlled with Norco tid.      Constipation MiraLax prn and off Colace 100mg  bid 08/22/14. C/o frequent loose stools-Questran tid for now. Hx of IBS --stable     Thoracic back pain Hx of c/o  pain in her right shoulder and shoulder blade(no decreased ROM of the right shoulder) and lower back  Hx of middle of her spine vertebral body got out of alignment from falling on staircase in 1950--chronic on and off pain since the event. Chronic pain is managed with Norco bid started 04/04/14. Prn Norco is still available to her.  --better controlled with Norco tid.    GERD (gastroesophageal reflux disease) Better since off Celebrex and continue Prilosec 20mg  daily. 07/07/14 Guaiac stools x3 negative.      Hypertension Controlled on Amlodipine 5mg  and Metoprolol 50mg  bid.      Depression with anxiety Stable, takes Mirtazapine 7.5mg  nightly, mood is stable. Breast lumps may contribute to her gradual weight loss.      Breast lump on left side at 7 o'clock position Mobile, smooth, a large marble size, non-tender at left breast 7 o'clock-will schedule Mammogram to evaluate further.  07/05/14 POA declined Mammogram. Desires no IVF. ABT for comfort measures only. --still mobile and non tender.     Loss of weight Gradual weight loss     Family/ Staff Communication: observe the patient  Goals of Care: SNF  Labs/tests ordered: none     Past Medical History  Diagnosis Date  . Osteoporosis   . Allergic rhinitis   . Dementia   . Osteoarthritis     hands  . IBS (irritable bowel syndrome)   . GERD (gastroesophageal reflux disease)   . Hypertension   . Palpitation     a. PAC's & PVC's by Holter - 2011  . Tortuous colon 2002  . Descending thoracic aortic dissection 04/24/2010    a. MRA 2011 - begins distal to left subclavian and extends throughout the full length of  the Ao.  Marland Kitchen Aortic insufficiency 04/16/2010    a. Echo 2011 - mild to moderate AI, EF 60%.  . Anemia, unspecified 01/12/2013  . Other abnormal blood chemistry 01/12/2013  . Lumbago 10/06/2012  . Personal history of fall 10/06/2012  . Hyposmolality and/or hyponatremia 09/29/2012  . Otitis externa 09/29/2012  . Impacted cerumen 09/29/2012  . Unspecified conductive hearing loss 09/29/2012  . External hemorrhoids without mention of complication 09/29/2012  . Unspecified venous (peripheral) insufficiency 09/29/2012  . Unspecified constipation 09/29/2012  . Abnormality of gait 09/29/2012  . Congestive heart failure, unspecified 06/30/2003   Past Surgical History  Procedure Laterality Date  . Dilation and curettage of uterus  1960  . Cataract extraction w/ intraocular lens implant     Social History:   reports that she has never smoked. She has never used smokeless tobacco. She reports that she  does not drink alcohol or use illicit drugs.  Family History  Problem Relation Age of Onset  . Diabetes Mother   . Coronary artery disease Father   . Hypertension Father   . Diabetes      sisters    Medications: Patient's Medications  New Prescriptions   No medications on file  Previous Medications   ACETAMINOPHEN (TYLENOL) 325 MG TABLET    Take 650 mg by mouth every 6 (six) hours as needed.    AMLODIPINE (NORVASC) 5 MG TABLET    Take 1 tablet (5 mg total) by mouth daily.   ARTIFICIAL TEAR SOLUTION (TEARS NATURALE II OP)    Apply 1 drop to eye daily. Both eyes twice daily   AZELASTINE (ASTELIN) 137 MCG/SPRAY NASAL SPRAY    Place 2 sprays into the nose. At bedtime   CALCIUM CITRATE-VITAMIN D (CITRACAL+D) 315-200 MG-UNIT PER TABLET    Take 2 tablets by mouth. Take twice daily   CHOLECALCIFEROL (VITAMIN D) 2000 UNITS CAPS    Take by mouth daily.     DOCUSATE SODIUM (COLACE) 100 MG CAPSULE    Take 100 mg by mouth 2 (two) times daily.   DONEPEZIL (ARICEPT) 10 MG TABLET    Take 1 tablet (10 mg total) by  mouth daily.   HYDROCODONE-ACETAMINOPHEN (NORCO/VICODIN) 5-325 MG PER TABLET    Take one tablet by mouth once daily for pain   MEMANTINE (NAMENDA) 10 MG TABLET    Take 28 mg by mouth daily.    METOPROLOL (LOPRESSOR) 50 MG TABLET    Take 1 tablet (50 mg total) by mouth 2 (two) times daily.   MIRTAZAPINE (REMERON) 7.5 MG TABLET    Take 7.5 mg by mouth at bedtime.   MULTIPLE VITAMINS-MINERALS (CENTRUM PO)    Take 1 tablet by mouth 1 day or 1 dose.   NUTRITIONAL SUPPLEMENTS (RESOURCE PO)    Take 120 mLs by mouth 2 (two) times daily. With med pass.   OMEPRAZOLE (PRILOSEC) 20 MG CAPSULE    Take 20 mg by mouth daily.   POLYETHYLENE GLYCOL (MIRALAX / GLYCOLAX) PACKET    Take 17 g by mouth. Fill cap to 17 gm mark, mix with 4 oz of fluid and take by mouth every other day.  Modified Medications   No medications on file  Discontinued Medications   No medications on file     Physical Exam: Physical Exam  Constitutional: She appears well-developed and well-nourished. No distress.  HENT:  Head: Normocephalic and atraumatic.  Nose: Nose normal.  Mouth/Throat: Oropharynx is clear and moist. No oropharyngeal exudate.  Eyes: Conjunctivae and EOM are normal. Pupils are equal, round, and reactive to light. Right eye exhibits no discharge. Left eye exhibits no discharge. No scleral icterus.  Neck: Normal range of motion. Neck supple. No JVD present. No thyromegaly present.  Cardiovascular: Normal rate and regular rhythm.   Murmur heard. Systolic murmur appreciated at the left sternal border 2/6  Pulmonary/Chest: Effort normal and breath sounds normal. No respiratory distress. She has no wheezes. She has no rales.  Abdominal: Soft. Bowel sounds are normal. She exhibits no distension. There is no tenderness.     Musculoskeletal: Normal range of motion. She exhibits tenderness. She exhibits no edema.  Pain in the right shoulder/shoulder blade, neck, middle/lower back-gait instability and frequent falling.  C/o aches allover-better since Norco tid. Scoliokyphosis.   Lymphadenopathy:    She has no cervical adenopathy.  Neurological: She is alert. She has normal reflexes. No cranial  nerve deficit. She exhibits normal muscle tone. Coordination normal.  Skin: Skin is warm and dry. No rash noted. She is not diaphoretic. No erythema.  Mobile, smooth, a large marble size, non-tender at left breast 7 o'clock  Psychiatric: Her mood appears not anxious. Her affect is inappropriate. Her affect is not angry, not blunt and not labile. Her speech is delayed. Her speech is not rapid and/or pressured, not tangential and not slurred. She is slowed. She is not agitated, not aggressive, not hyperactive, not withdrawn and not actively hallucinating. Thought content is not paranoid and not delusional. Cognition and memory are impaired. She expresses impulsivity and inappropriate judgment. She does not exhibit a depressed mood. She is communicative. She exhibits abnormal recent memory. She exhibits normal remote memory. She is attentive.    Filed Vitals:   02/25/15 1612  BP: 130/70  Pulse: 70  Temp: 97.6 F (36.4 C)  TempSrc: Tympanic  Resp: 20      Labs reviewed: Basic Metabolic Panel:  Recent Labs  19/14/78 07/03/14 10/02/14  NA 138 144 141  K 4.5 3.7 3.9  BUN 33* 26* 29*  CREATININE 0.8 0.7 0.8  TSH  --   --  0.90   Liver Function Tests:  Recent Labs  03/23/14 10/02/14  AST 15 12*  ALT 9 8  ALKPHOS 42 40   No results for input(s): LIPASE, AMYLASE in the last 8760 hours. No results for input(s): AMMONIA in the last 8760 hours. CBC:  Recent Labs  03/23/14 07/03/14 10/02/14  WBC 5.8 5.7 6.1  HGB 12.7 12.0 11.9*  HCT 39 37 37  PLT 123* 112* 130*   Lipid Panel: No results for input(s): CHOL, HDL, LDLCALC, TRIG, CHOLHDL, LDLDIRECT in the last 8760 hours.  Past Procedures:  None recently.   Assessment/Plan Dementia, vascular SNF for care. Continue Aricept and Namenda. MMSE 22/30  10/26/14      OSTEOARTHROSIS, GENERALIZED, MULTIPLE SITES Hx of c/o  pain in her right shoulder and shoulder blade(no decreased ROM of the right shoulder) and lower back  Hx of middle of her spine vertebral body got out of alignment from falling on staircase in 1950--chronic on and off pain since the event. Chronic pain is managed with Norco bid started 04/04/14. Prn Norco is still available to her.  --better controlled with Norco tid.      Constipation MiraLax prn and off Colace 100mg  bid 08/22/14. C/o frequent loose stools-Questran tid for now. Hx of IBS --stable     Thoracic back pain Hx of c/o  pain in her right shoulder and shoulder blade(no decreased ROM of the right shoulder) and lower back  Hx of middle of her spine vertebral body got out of alignment from falling on staircase in 1950--chronic on and off pain since the event. Chronic pain is managed with Norco bid started 04/04/14. Prn Norco is still available to her.  --better controlled with Norco tid.    GERD (gastroesophageal reflux disease) Better since off Celebrex and continue Prilosec 20mg  daily. 07/07/14 Guaiac stools x3 negative.      Hypertension Controlled on Amlodipine 5mg  and Metoprolol 50mg  bid.      Depression with anxiety Stable, takes Mirtazapine 7.5mg  nightly, mood is stable. Breast lumps may contribute to her gradual weight loss.      Breast lump on left side at 7 o'clock position Mobile, smooth, a large marble size, non-tender at left breast 7 o'clock-will schedule Mammogram to evaluate further.  07/05/14 POA declined Mammogram. Desires no IVF.  ABT for comfort measures only. --still mobile and non tender.     Loss of weight Gradual weight loss     Family/ Staff Communication: observe the patient  Goals of Care: SNF  Labs/tests ordered: none

## 2015-02-25 NOTE — Assessment & Plan Note (Signed)
MiraLax prn and off Colace 100mg bid 08/22/14. C/o frequent loose stools-Questran tid for now. Hx of IBS --stable  

## 2015-02-25 NOTE — Assessment & Plan Note (Signed)
SNF for care. Continue Aricept and Namenda. MMSE 22/30 10/26/14  

## 2015-02-25 NOTE — Assessment & Plan Note (Signed)
Mobile, smooth, a large marble size, non-tender at left breast 7 o'clock-will schedule Mammogram to evaluate further.  07/05/14 POA declined Mammogram. Desires no IVF. ABT for comfort measures only. --still mobile and non tender.    

## 2015-02-25 NOTE — Assessment & Plan Note (Signed)
Controlled on Amlodipine 5mg and Metoprolol 50mg bid.     

## 2015-03-11 ENCOUNTER — Other Ambulatory Visit: Payer: Self-pay | Admitting: Nurse Practitioner

## 2015-03-11 DIAGNOSIS — E559 Vitamin D deficiency, unspecified: Secondary | ICD-10-CM

## 2015-04-01 ENCOUNTER — Encounter: Payer: Self-pay | Admitting: Internal Medicine

## 2015-04-01 ENCOUNTER — Non-Acute Institutional Stay (SKILLED_NURSING_FACILITY): Payer: Medicare Other | Admitting: Internal Medicine

## 2015-04-01 DIAGNOSIS — W19XXXD Unspecified fall, subsequent encounter: Secondary | ICD-10-CM

## 2015-04-01 DIAGNOSIS — R269 Unspecified abnormalities of gait and mobility: Secondary | ICD-10-CM

## 2015-04-01 DIAGNOSIS — F418 Other specified anxiety disorders: Secondary | ICD-10-CM

## 2015-04-01 DIAGNOSIS — N6324 Unspecified lump in the left breast, lower inner quadrant: Secondary | ICD-10-CM

## 2015-04-01 DIAGNOSIS — R634 Abnormal weight loss: Secondary | ICD-10-CM | POA: Diagnosis not present

## 2015-04-01 DIAGNOSIS — I10 Essential (primary) hypertension: Secondary | ICD-10-CM

## 2015-04-01 DIAGNOSIS — N63 Unspecified lump in breast: Secondary | ICD-10-CM | POA: Diagnosis not present

## 2015-04-01 DIAGNOSIS — R627 Adult failure to thrive: Secondary | ICD-10-CM

## 2015-04-01 DIAGNOSIS — F0150 Vascular dementia without behavioral disturbance: Secondary | ICD-10-CM | POA: Diagnosis not present

## 2015-04-01 HISTORY — DX: Adult failure to thrive: R62.7

## 2015-04-01 NOTE — Progress Notes (Signed)
Patient ID: Jamie [name] M Dress, female   DOB: 03/09/1920, 81 y.o.   MRN: [id number]    HISTORY AND PHYSICAL  Location:  [facility] Room Number: 22 Place of Service: SNF (31)   Extended Emergency Contact Information Primary Emergency Contact: Mcneff,Charles Address: [address]          [place], [place] [address] [place] Home Phone: [phone] Relation: Son Secondary Emergency Contact: Stuart,Cleleste Address: [address]          [place], [place] [address] [place] Home Phone: [phone] Mobile Phone: [phone] Relation: Daughter  Advanced Directive information Does patient have an advance directive?: Yes, Type of Advance Directive: Living will;Healthcare Power of Attorney, Does patient want to make changes to advanced directive?: No - Patient declined  Chief Complaint  Patient presents with  . Annual Exam    HPI:   Dementia, vascular, without behavioral disturbance: Stable  Gait disorder: At risk for falls  Fall, subsequent encounter: Has fallen a couple times. No recent injury  Essential hypertension: Controlled  Loss of weight: Persistent loss of weight and loss of appetite. Weight now down to 105 pounds. In April 2015 she weighed 129 pounds. She was 117 pounds in January 2016  Depression with anxiety: Poor appetite: I hurt all over. Currently getting mirtazapine for appetite enhancement and depression.    Past Medical History  Diagnosis Date  . Osteoporosis   . Allergic rhinitis   . Dementia   . Osteoarthritis     hands  . IBS (irritable bowel syndrome)   . GERD (gastroesophageal reflux disease)   . Hypertension   . Palpitation     a. PAC's & PVC's by Holter - 2011  . Tortuous colon 2002  . Descending thoracic aortic dissection 04/24/2010    a. MRA 2011 - begins distal to left subclavian and extends throughout the full length of the Ao.  Marland Kitchen. Aortic insufficiency 04/16/2010    a. Echo 2011 - mild  to moderate AI, EF 60%.  . Anemia, unspecified 01/12/2013  . Other abnormal blood chemistry 01/12/2013  . Lumbago 10/06/2012  . Personal history of fall 10/06/2012  . Hyposmolality and/or hyponatremia 09/29/2012  . Otitis externa 09/29/2012  . Impacted cerumen 09/29/2012  . Unspecified conductive hearing loss 09/29/2012  . External hemorrhoids without mention of complication 09/29/2012  . Unspecified venous (peripheral) insufficiency 09/29/2012  . Unspecified constipation 09/29/2012  . Abnormality of gait 09/29/2012  . Congestive heart failure, unspecified 06/30/2003  . Failure to thrive in adult 04/01/2015    Past Surgical History  Procedure Laterality Date  . Dilation and curettage of uterus  1960  . Cataract extraction w/ intraocular lens implant      Patient Care Team: Kimber RelicArthur G Lemoine Goyne, MD as PCP - General (Internal Medicine) Laurey Moralealton S McLean, MD as Consulting Physician (Cardiology)  History   Social History  . Marital Status: Widowed    Spouse Name: N/A  . Number of Children: N/A  . Years of Education: N/A   Occupational History  . Not on file.   Social History Main Topics  . Smoking status: Never Smoker   . Smokeless tobacco: Never Used  . Alcohol Use: No  . Drug Use: No  . Sexual Activity: No   Other Topics Concern  . Not on file   Social History Narrative   Uses meals on wheels and [facility]     reports that  she has never smoked. She has never used smokeless tobacco. She reports that she does not drink alcohol or use illicit drugs.  Family History  Problem Relation Age of Onset  . Diabetes Mother   . Coronary artery disease Father   . Hypertension Father   . Diabetes      sisters   No family status information on file.    Immunization History  Administered Date(s) Administered  . Influenza Whole 09/02/2005, 09/05/2007, 09/28/2012  . Pneumococcal Polysaccharide-23 11/24/2003  . Td 09/29/2002    Allergies  Allergen  Reactions  . Alendronate Sodium     REACTION: GI    Medications: Patient's Medications  New Prescriptions   No medications on file  Previous Medications   ACETAMINOPHEN (TYLENOL) 325 MG TABLET    Take 650 mg by mouth every 6 (six) hours as needed.    AMLODIPINE (NORVASC) 5 MG TABLET    Take 1 tablet (5 mg total) by mouth daily.   ARTIFICIAL TEAR SOLUTION (TEARS NATURALE II OP)    Apply 1 drop to eye daily. Both eyes twice daily   AZELASTINE (ASTELIN) 137 MCG/SPRAY NASAL SPRAY    Place 2 sprays into the nose. At bedtime   DOCUSATE SODIUM (COLACE) 100 MG CAPSULE    Take 100 mg by mouth 2 (two) times daily.   DONEPEZIL (ARICEPT) 10 MG TABLET    Take 1 tablet (10 mg total) by mouth daily.   HYDROCODONE-ACETAMINOPHEN (NORCO/VICODIN) 5-325 MG PER TABLET    Take one tablet by mouth once daily for pain   MEMANTINE (NAMENDA) 10 MG TABLET    Take 28 mg by mouth daily.    METOPROLOL (LOPRESSOR) 50 MG TABLET    Take 1 tablet (50 mg total) by mouth 2 (two) times daily.   MIRTAZAPINE (REMERON) 7.5 MG TABLET    Take 7.5 mg by mouth at bedtime.   MULTIPLE VITAMINS-MINERALS (CENTRUM PO)    Take 1 tablet by mouth 1 day or 1 dose.   NUTRITIONAL SUPPLEMENTS (RESOURCE PO)    Take 120 mLs by mouth 2 (two) times daily. With med pass.   OMEPRAZOLE (PRILOSEC) 20 MG CAPSULE    Take 20 mg by mouth daily.   POLYETHYLENE GLYCOL (MIRALAX / GLYCOLAX) PACKET    Take 17 g by mouth. Fill cap to 17 gm mark, mix with 4 oz of fluid and take by mouth every other day.  Modified Medications   No medications on file  Discontinued Medications   No medications on file    Review of Systems  Constitutional: Negative for fever, chills and diaphoresis.  HENT: Positive for hearing loss. Negative for congestion, ear discharge, ear pain, nosebleeds, sore throat and tinnitus.   Eyes: Negative for photophobia, pain, discharge and redness.  Respiratory: Negative for cough, shortness of breath, wheezing and stridor.   Cardiovascular:  Negative for chest pain, palpitations and leg swelling.  Gastrointestinal: Negative for nausea, vomiting, abdominal pain, diarrhea, constipation and blood in stool.  Endocrine: Negative for polydipsia.  Genitourinary: Positive for frequency. Negative for dysuria, urgency, hematuria and flank pain.  Musculoskeletal: Positive for back pain. Negative for myalgias and neck pain.  Skin: Negative for rash.       Left breast mass  Allergic/Immunologic: Negative for environmental allergies.  Neurological: Negative for dizziness, tremors, seizures, weakness and headaches.  Hematological: Does not bruise/bleed easily.  Psychiatric/Behavioral: Negative for suicidal ideas and hallucinations. The patient is not nervous/anxious.     Filed Vitals:   04/01/15 1556  BP: 130/80  Pulse: 78  Temp: 98 F (36.7 C)  Resp: 20  Height: 5\' 5"  (1.651 m)  Weight: 105 lb (47.628 kg)   Body mass index is 17.47 kg/(m^2).  Physical Exam  Constitutional: She appears well-developed and well-nourished. No distress.  HENT:  Head: Normocephalic and atraumatic.  Nose: Nose normal.  Mouth/Throat: Oropharynx is clear and moist. No oropharyngeal exudate.  Eyes: Conjunctivae and EOM are normal. Pupils are equal, round, and reactive to light. Right eye exhibits no discharge. Left eye exhibits no discharge. No scleral icterus.  Neck: Normal range of motion. Neck supple. No JVD present. No thyromegaly present.  Cardiovascular: Normal rate and regular rhythm.   Murmur heard. Systolic murmur appreciated at the left sternal border 2/6  Pulmonary/Chest: Effort normal and breath sounds normal. No respiratory distress. She has no wheezes. She has no rales.  Left breast mass.  Abdominal: Soft. Bowel sounds are normal. She exhibits no distension. There is no tenderness.  Musculoskeletal: Normal range of motion. She exhibits tenderness. She exhibits no edema.  Pain in the right shoulder/shoulder blade, neck, middle/lower  back-gait instability and frequent falling. C/o aches allover-better since Norco tid. Scoliokyphosis.   Lymphadenopathy:    She has no cervical adenopathy.  Neurological: She is alert. She has normal reflexes. No cranial nerve deficit. She exhibits normal muscle tone. Coordination normal.  Skin: Skin is warm and dry. No rash noted. She is not diaphoretic. No erythema.  Mobile, smooth, a large marble size, non-tender at left breast 7 o'clock  Psychiatric: Her mood appears not anxious. Her affect is inappropriate. Her affect is not angry, not blunt and not labile. Her speech is delayed. Her speech is not rapid and/or pressured, not tangential and not slurred. She is slowed. She is not agitated, not aggressive, not hyperactive, not withdrawn and not actively hallucinating. Thought content is not paranoid and not delusional. Cognition and memory are impaired. She expresses impulsivity and inappropriate judgment. She does not exhibit a depressed mood. She is communicative. She exhibits abnormal recent memory. She exhibits normal remote memory. She is attentive.     Labs reviewed: No visits with results within 3 Month(s) from this visit. Latest known visit with results is:  Lab on 10/03/2014  Component Date Value Ref Range Status  . Hemoglobin 10/02/2014 11.9* 12.0 - 16.0 g/dL Final  . HCT 40/98/119111/08/2014 37  36 - 46 % Final  . Platelets 10/02/2014 130* 150 - 399 K/L Final  . WBC 10/02/2014 6.1   Final  . Glucose 10/02/2014 92   Final  . BUN 10/02/2014 29* 4 - 21 mg/dL Final  . Creatinine 47/82/956211/08/2014 0.8  0.5 - 1.1 mg/dL Final  . Potassium 13/08/657811/08/2014 3.9  3.4 - 5.3 mmol/L Final  . Sodium 10/02/2014 141  137 - 147 mmol/L Final  . Alkaline Phosphatase 10/02/2014 40  25 - 125 U/L Final  . ALT 10/02/2014 8  7 - 35 U/L Final  . AST 10/02/2014 12* 13 - 35 U/L Final  . Bilirubin, Total 10/02/2014 0.3   Final  . TSH 10/02/2014 0.90  0.41 - 5.90 uIU/mL Final     Assessment/Plan  1. Dementia, vascular,  without behavioral disturbance Unchanged  2. Gait disorder Using walker independently  3. Fall, subsequent encounter At risk for further falls  4. Essential hypertension 12  5. Loss of weight Possibly related to depression  6. Depression with anxiety DC mirtazapine Start Cymbalta 30 mg every morning  7. Breast lump on left side at  7 o'clock position Patient prefers that nothing more be done about this.

## 2015-04-15 ENCOUNTER — Non-Acute Institutional Stay (SKILLED_NURSING_FACILITY): Payer: Medicare Other | Admitting: Nurse Practitioner

## 2015-04-15 ENCOUNTER — Encounter: Payer: Self-pay | Admitting: Nurse Practitioner

## 2015-04-15 DIAGNOSIS — K5900 Constipation, unspecified: Secondary | ICD-10-CM | POA: Diagnosis not present

## 2015-04-15 DIAGNOSIS — F418 Other specified anxiety disorders: Secondary | ICD-10-CM

## 2015-04-15 DIAGNOSIS — N63 Unspecified lump in breast: Secondary | ICD-10-CM

## 2015-04-15 DIAGNOSIS — M159 Polyosteoarthritis, unspecified: Secondary | ICD-10-CM

## 2015-04-15 DIAGNOSIS — N6324 Unspecified lump in the left breast, lower inner quadrant: Secondary | ICD-10-CM

## 2015-04-15 DIAGNOSIS — F0150 Vascular dementia without behavioral disturbance: Secondary | ICD-10-CM

## 2015-04-15 DIAGNOSIS — I10 Essential (primary) hypertension: Secondary | ICD-10-CM

## 2015-04-15 DIAGNOSIS — K219 Gastro-esophageal reflux disease without esophagitis: Secondary | ICD-10-CM

## 2015-04-15 NOTE — Progress Notes (Deleted)
Patient ID: Jamie [name], female   DOB: 1920-08-07, 81 y.o.   MRN: [id number]   Code Status: DNR  Allergies  Allergen Reactions  . Alendronate Sodium     REACTION: GI    Chief Complaint  Patient presents with  . Medical Management of Chronic Issues  . Acute Visit    pain, left breast mass    HPI: Patient is a 81 y.o. female seen in the SNF at [facility] today for evaluation of chronic medical conditions.  Problem List Items Addressed This Visit    None      Review of Systems:  Review of Systems  Constitutional: Negative for fever, chills and diaphoresis.  HENT: Positive for hearing loss. Negative for congestion, ear discharge, ear pain, nosebleeds, sore throat and tinnitus.   Eyes: Negative for photophobia, pain, discharge and redness.  Respiratory: Negative for cough, shortness of breath, wheezing and stridor.   Cardiovascular: Negative for chest pain, palpitations and leg swelling.  Gastrointestinal: Negative for nausea, vomiting, abdominal pain, diarrhea, constipation and blood in stool.  Endocrine: Negative for polydipsia.  Genitourinary: Positive for frequency. Negative for dysuria, urgency, hematuria and flank pain.  Musculoskeletal: Positive for back pain. Negative for myalgias and neck pain.  Skin: Negative for rash.       Left breast mass  Allergic/Immunologic: Negative for environmental allergies.  Neurological: Negative for dizziness, tremors, seizures, weakness and headaches.  Hematological: Does not bruise/bleed easily.  Psychiatric/Behavioral: Negative for suicidal ideas and hallucinations. The patient is not nervous/anxious.    Patient ID: Jamie [name], female   DOB: Feb 10, 1920, 81 y.o.   MRN: [id number]   Code Status: DNR  Allergies  Allergen Reactions  . Alendronate Sodium     REACTION: GI    Chief Complaint  Patient presents with  . Medical Management of Chronic Issues  . Acute Visit    pain, left breast mass    HPI: Patient is a 81  y.o. female seen in the SNF at [facility] today for evaluation of chronic medical conditions.  Problem List Items Addressed This Visit    None      Review of Systems:  Review of Systems  Constitutional: Negative for fever, chills, weight loss, malaise/fatigue and diaphoresis.  HENT: Positive for hearing loss. Negative for congestion, ear discharge, ear pain, nosebleeds, sore throat and tinnitus.   Eyes: Negative for blurred vision, double vision, photophobia, pain, discharge and redness.  Respiratory: Negative for cough, hemoptysis, sputum production, shortness of breath, wheezing and stridor.   Cardiovascular: Negative for chest pain, palpitations, orthopnea, claudication, leg swelling and PND.  Gastrointestinal: Negative for heartburn, nausea, vomiting, abdominal pain, diarrhea, constipation, blood in stool and melena.  Genitourinary: Positive for frequency. Negative for dysuria, urgency, hematuria and flank pain.  Musculoskeletal: Positive for back pain and joint pain. Negative for myalgias, falls and neck pain.  Skin: Negative for itching and rash.       Left breast mass  Neurological: Negative for dizziness, tingling, tremors, sensory change, speech change, focal weakness, seizures, loss of consciousness, weakness and headaches.  Endo/Heme/Allergies: Negative for environmental allergies and polydipsia. Does not bruise/bleed easily.  Psychiatric/Behavioral: Positive for memory loss. Negative for depression, suicidal ideas, hallucinations and substance abuse. The patient is not nervous/anxious and does not have insomnia.      Past Medical History  Diagnosis Date  . Osteoporosis   . Allergic rhinitis   . Dementia   . Osteoarthritis     hands  .  IBS (irritable bowel syndrome)   . GERD (gastroesophageal reflux disease)   . Hypertension   . Palpitation     a. PAC's & PVC's by Holter - 2011  . Tortuous colon 2002  . Descending thoracic aortic dissection 04/24/2010    a.  MRA 2011 - begins distal to left subclavian and extends throughout the full length of the Ao.  Marland Kitchen Aortic insufficiency 04/16/2010    a. Echo 2011 - mild to moderate AI, EF 60%.  . Anemia, unspecified 01/12/2013  . Other abnormal blood chemistry 01/12/2013  . Lumbago 10/06/2012  . Personal history of fall 10/06/2012  . Hyposmolality and/or hyponatremia 09/29/2012  . Otitis externa 09/29/2012  . Impacted cerumen 09/29/2012  . Unspecified conductive hearing loss 09/29/2012  . External hemorrhoids without mention of complication 09/29/2012  . Unspecified venous (peripheral) insufficiency 09/29/2012  . Unspecified constipation 09/29/2012  . Abnormality of gait 09/29/2012  . Congestive heart failure, unspecified 06/30/2003  . Failure to thrive in adult 04/01/2015   Past Surgical History  Procedure Laterality Date  . Dilation and curettage of uterus  1960  . Cataract extraction w/ intraocular lens implant     Social History:   reports that she has never smoked. She has never used smokeless tobacco. She reports that she does not drink alcohol or use illicit drugs.  Family History  Problem Relation Age of Onset  . Diabetes Mother   . Coronary artery disease Father   . Hypertension Father   . Diabetes      sisters    Medications: Patient's Medications  New Prescriptions   No medications on file  Previous Medications   ACETAMINOPHEN (TYLENOL) 325 MG TABLET    Take 650 mg by mouth every 6 (six) hours as needed.    AMLODIPINE (NORVASC) 5 MG TABLET    Take 1 tablet (5 mg total) by mouth daily.   ARTIFICIAL TEAR SOLUTION (TEARS NATURALE II OP)    Apply 1 drop to eye daily. Both eyes twice daily   AZELASTINE (ASTELIN) 137 MCG/SPRAY NASAL SPRAY    Place 2 sprays into the nose. At bedtime   DOCUSATE SODIUM (COLACE) 100 MG CAPSULE    Take 100 mg by mouth 2 (two) times daily.   DONEPEZIL (ARICEPT) 10 MG TABLET    Take 1 tablet (10 mg total) by mouth daily.   HYDROCODONE-ACETAMINOPHEN (NORCO/VICODIN)  5-325 MG PER TABLET    Take one tablet by mouth once daily for pain   MEMANTINE (NAMENDA) 10 MG TABLET    Take 28 mg by mouth daily.    METOPROLOL (LOPRESSOR) 50 MG TABLET    Take 1 tablet (50 mg total) by mouth 2 (two) times daily.   MIRTAZAPINE (REMERON) 7.5 MG TABLET    Take 7.5 mg by mouth at bedtime.   MULTIPLE VITAMINS-MINERALS (CENTRUM PO)    Take 1 tablet by mouth 1 day or 1 dose.   NUTRITIONAL SUPPLEMENTS (RESOURCE PO)    Take 120 mLs by mouth 2 (two) times daily. With med pass.   OMEPRAZOLE (PRILOSEC) 20 MG CAPSULE    Take 20 mg by mouth daily.   POLYETHYLENE GLYCOL (MIRALAX / GLYCOLAX) PACKET    Take 17 g by mouth. Fill cap to 17 gm mark, mix with 4 oz of fluid and take by mouth every other day.  Modified Medications   No medications on file  Discontinued Medications   No medications on file     Physical Exam: Physical Exam  Constitutional: She  appears well-developed and well-nourished. No distress.  HENT:  Head: Normocephalic and atraumatic.  Nose: Nose normal.  Mouth/Throat: Oropharynx is clear and moist. No oropharyngeal exudate.  Eyes: Conjunctivae and EOM are normal. Pupils are equal, round, and reactive to light. Right eye exhibits no discharge. Left eye exhibits no discharge. No scleral icterus.  Neck: Normal range of motion. Neck supple. No JVD present. No thyromegaly present.  Cardiovascular: Normal rate and regular rhythm.   Murmur heard. Systolic murmur appreciated at the left sternal border 2/6  Pulmonary/Chest: Effort normal and breath sounds normal. No respiratory distress. She has no wheezes. She has no rales.  Abdominal: Soft. Bowel sounds are normal. She exhibits no distension. There is no tenderness.     Musculoskeletal: Normal range of motion. She exhibits tenderness. She exhibits no edema.  Pain in the right shoulder/shoulder blade, neck, middle/lower back-gait instability and frequent falling. C/o aches allover-better since Norco tid. Scoliokyphosis.     Lymphadenopathy:    She has no cervical adenopathy.  Neurological: She is alert. She has normal reflexes. No cranial nerve deficit. She exhibits normal muscle tone. Coordination normal.  Skin: Skin is warm and dry. No rash noted. She is not diaphoretic. No erythema.  Mobile, smooth, a large marble size, non-tender at left breast 7 o'clock  Psychiatric: Her mood appears not anxious. Her affect is inappropriate. Her affect is not angry, not blunt and not labile. Her speech is delayed. Her speech is not rapid and/or pressured, not tangential and not slurred. She is slowed. She is not agitated, not aggressive, not hyperactive, not withdrawn and not actively hallucinating. Thought content is not paranoid and not delusional. Cognition and memory are impaired. She expresses impulsivity and inappropriate judgment. She does not exhibit a depressed mood. She is communicative. She exhibits abnormal recent memory. She exhibits normal remote memory. She is attentive.    Filed Vitals:   04/15/15 1347  BP: 142/80  Pulse: 60  Temp: 98.4 F (36.9 C)  TempSrc: Tympanic  Resp: 16      Labs reviewed: Basic Metabolic Panel:  Recent Labs  16/08/9607/11/15 10/02/14  NA 144 141  K 3.7 3.9  BUN 26* 29*  CREATININE 0.7 0.8  TSH  --  0.90    CBC:     Component Value Date/Time   WBC 6.1 10/02/2014   WBC 6.0 05/28/2012 1205   RBC 4.38 05/28/2012 1205   HGB 11.9* 10/02/2014   HCT 37 10/02/2014   PLT 130* 10/02/2014   MCV 90.9 05/28/2012 1205   MCH 29.9 05/28/2012 1205   MCHC 32.9 05/28/2012 1205   RDW 12.9 05/28/2012 1205   LYMPHSABS 1.4 05/28/2012 1205   MONOABS 0.6 05/28/2012 1205   EOSABS 0.1 05/28/2012 1205   BASOSABS 0.0 05/28/2012 1205   Assessment/Plan No problem-specific assessment & plan notes found for this encounter.   Family/ Staff Communication: observe the patient  Goals of Care: SNF  Labs/tests ordered: none     Past Medical History  Diagnosis Date  . Osteoporosis   .  Allergic rhinitis   . Dementia   . Osteoarthritis     hands  . IBS (irritable bowel syndrome)   . GERD (gastroesophageal reflux disease)   . Hypertension   . Palpitation     a. PAC's & PVC's by Holter - 2011  . Tortuous colon 2002  . Descending thoracic aortic dissection 04/24/2010    a. MRA 2011 - begins distal to left subclavian and extends throughout the full length of  the Ao.  Marland Kitchen Aortic insufficiency 04/16/2010    a. Echo 2011 - mild to moderate AI, EF 60%.  . Anemia, unspecified 01/12/2013  . Other abnormal blood chemistry 01/12/2013  . Lumbago 10/06/2012  . Personal history of fall 10/06/2012  . Hyposmolality and/or hyponatremia 09/29/2012  . Otitis externa 09/29/2012  . Impacted cerumen 09/29/2012  . Unspecified conductive hearing loss 09/29/2012  . External hemorrhoids without mention of complication 09/29/2012  . Unspecified venous (peripheral) insufficiency 09/29/2012  . Unspecified constipation 09/29/2012  . Abnormality of gait 09/29/2012  . Congestive heart failure, unspecified 06/30/2003  . Failure to thrive in adult 04/01/2015   Past Surgical History  Procedure Laterality Date  . Dilation and curettage of uterus  1960  . Cataract extraction w/ intraocular lens implant     Social History:   reports that she has never smoked. She has never used smokeless tobacco. She reports that she does not drink alcohol or use illicit drugs.  Family History  Problem Relation Age of Onset  . Diabetes Mother   . Coronary artery disease Father   . Hypertension Father   . Diabetes      sisters    Medications: Patient's Medications  New Prescriptions   No medications on file  Previous Medications   ACETAMINOPHEN (TYLENOL) 325 MG TABLET    Take 650 mg by mouth every 6 (six) hours as needed.    AMLODIPINE (NORVASC) 5 MG TABLET    Take 1 tablet (5 mg total) by mouth daily.   ARTIFICIAL TEAR SOLUTION (TEARS NATURALE II OP)    Apply 1 drop to eye daily. Both eyes twice daily   AZELASTINE  (ASTELIN) 137 MCG/SPRAY NASAL SPRAY    Place 2 sprays into the nose. At bedtime   DOCUSATE SODIUM (COLACE) 100 MG CAPSULE    Take 100 mg by mouth 2 (two) times daily.   DONEPEZIL (ARICEPT) 10 MG TABLET    Take 1 tablet (10 mg total) by mouth daily.   HYDROCODONE-ACETAMINOPHEN (NORCO/VICODIN) 5-325 MG PER TABLET    Take one tablet by mouth once daily for pain   MEMANTINE (NAMENDA) 10 MG TABLET    Take 28 mg by mouth daily.    METOPROLOL (LOPRESSOR) 50 MG TABLET    Take 1 tablet (50 mg total) by mouth 2 (two) times daily.   MIRTAZAPINE (REMERON) 7.5 MG TABLET    Take 7.5 mg by mouth at bedtime.   MULTIPLE VITAMINS-MINERALS (CENTRUM PO)    Take 1 tablet by mouth 1 day or 1 dose.   NUTRITIONAL SUPPLEMENTS (RESOURCE PO)    Take 120 mLs by mouth 2 (two) times daily. With med pass.   OMEPRAZOLE (PRILOSEC) 20 MG CAPSULE    Take 20 mg by mouth daily.   POLYETHYLENE GLYCOL (MIRALAX / GLYCOLAX) PACKET    Take 17 g by mouth. Fill cap to 17 gm mark, mix with 4 oz of fluid and take by mouth every other day.  Modified Medications   No medications on file  Discontinued Medications   No medications on file     Physical Exam: Physical Exam  Constitutional: She appears well-developed and well-nourished. No distress.  HENT:  Head: Normocephalic and atraumatic.  Nose: Nose normal.  Mouth/Throat: Oropharynx is clear and moist. No oropharyngeal exudate.  Eyes: Conjunctivae and EOM are normal. Pupils are equal, round, and reactive to light. Right eye exhibits no discharge. Left eye exhibits no discharge. No scleral icterus.  Neck: Normal range of motion. Neck supple.  No JVD present. No thyromegaly present.  Cardiovascular: Normal rate and regular rhythm.   Murmur heard. Systolic murmur appreciated at the left sternal border 2/6  Pulmonary/Chest: Effort normal and breath sounds normal. No respiratory distress. She has no wheezes. She has no rales.  Abdominal: Soft. Bowel sounds are normal. She exhibits no  distension. There is no tenderness.     Musculoskeletal: Normal range of motion. She exhibits tenderness. She exhibits no edema.  Pain in the right shoulder/shoulder blade, neck, middle/lower back-gait instability and frequent falling. C/o aches allover-better since Norco tid. Scoliokyphosis.   Lymphadenopathy:    She has no cervical adenopathy.  Neurological: She is alert. She has normal reflexes. No cranial nerve deficit. She exhibits normal muscle tone. Coordination normal.  Skin: Skin is warm and dry. No rash noted. She is not diaphoretic. No erythema.  Mobile, smooth, a large marble size, non-tender at left breast 7 o'clock  Psychiatric: Her mood appears not anxious. Her affect is inappropriate. Her affect is not angry, not blunt and not labile. Her speech is delayed. Her speech is not rapid and/or pressured, not tangential and not slurred. She is slowed. She is not agitated, not aggressive, not hyperactive, not withdrawn and not actively hallucinating. Thought content is not paranoid and not delusional. Cognition and memory are impaired. She expresses impulsivity and inappropriate judgment. She does not exhibit a depressed mood. She is communicative. She exhibits abnormal recent memory. She exhibits normal remote memory. She is attentive.    Filed Vitals:   04/15/15 1347  BP: 142/80  Pulse: 60  Temp: 98.4 F (36.9 C)  TempSrc: Tympanic  Resp: 16      Labs reviewed: Basic Metabolic Panel:  Recent Labs  16/10/96 10/02/14  NA 144 141  K 3.7 3.9  BUN 26* 29*  CREATININE 0.7 0.8  TSH  --  0.90   Liver Function Tests:  Recent Labs  10/02/14  AST 12*  ALT 8  ALKPHOS 40   No results for input(s): LIPASE, AMYLASE in the last 8760 hours. No results for input(s): AMMONIA in the last 8760 hours. CBC:  Recent Labs  07/03/14 10/02/14  WBC 5.7 6.1  HGB 12.0 11.9*  HCT 37 37  PLT 112* 130*   Lipid Panel: No results for input(s): CHOL, HDL, LDLCALC, TRIG, CHOLHDL,  LDLDIRECT in the last 8760 hours.  Past Procedures:  None recently.   Assessment/Plan No problem-specific assessment & plan notes found for this encounter.   Family/ Staff Communication: observe the patient  Goals of Care: SNF  Labs/tests ordered: none

## 2015-04-17 ENCOUNTER — Encounter: Payer: Self-pay | Admitting: Nurse Practitioner

## 2015-04-17 NOTE — Assessment & Plan Note (Signed)
SNF for care. Continue Aricept and Namenda. MMSE 22/30 10/26/14

## 2015-04-17 NOTE — Assessment & Plan Note (Signed)
Controlled on Amlodipine 5mg and Metoprolol 50mg bid.     

## 2015-04-17 NOTE — Assessment & Plan Note (Signed)
Better since off Celebrex and continue Prilosec 20mg daily. 07/07/14 Guaiac stools x3 negative.     

## 2015-04-17 NOTE — Progress Notes (Signed)
Patient ID: Agustin Creedna M Chism, female   DOB: 03/05/1920, 81 y.o.   MRN: [id number]   Code Status: DNR  Allergies  Allergen Reactions  . Alendronate Sodium     REACTION: GI    Chief Complaint  Patient presents with  . Medical Management of Chronic Issues  . Acute Visit    pain, left breast mass    HPI: Patient is a 81 y.o. female seen in [facility] SNF [facility] today for evaluation of left breast mass and other chronic medical conditions.  Problem List Items Addressed This Visit    Dementia, vascular (Chronic)    SNF for care. Continue Aricept and Namenda. MMSE 22/30 10/26/14       Hypertension (Chronic)    Controlled on Amlodipine 5mg  and Metoprolol 50mg  bid.          Depression with anxiety - Primary (Chronic)    04/01/15 off Mirtazapine and started Cymbalta 30mg  in setting of aches and pains.        OSTEOARTHROSIS, GENERALIZED, MULTIPLE SITES    Hx of c/o  pain in her right shoulder and shoulder blade(no decreased ROM of the right shoulder) and lower back  Hx of middle of her spine vertebral body got out of alignment from falling on staircase in 1950--chronic on and off pain since the event. Chronic pain is managed with Norco bid started 04/04/14. Prn Norco is still available to her.  --better controlled with Norco tid. 04/01/15 started Cymbalta may help too.         Constipation    MiraLax prn and off Colace 100mg  bid 08/22/14. C/o frequent loose stools-Questran tid for now. Hx of IBS --stable       GERD (gastroesophageal reflux disease)    Better since off Celebrex and continue Prilosec 20mg  daily. 07/07/14 Guaiac stools x3 negative.         Breast lump on left side at 7 o'clock position    Mobile, smooth, a large marble size, non-tender at left breast 7 o'clock-will schedule Mammogram to evaluate further.  07/05/14 POA declined Mammogram. Desires no IVF. ABT for comfort measures only. --still mobile and non tender.            Review of Systems:    Review of Systems  Constitutional: Negative for fever, chills and diaphoresis.  HENT: Positive for hearing loss. Negative for congestion, ear discharge, ear pain, nosebleeds, sore throat and tinnitus.   Eyes: Negative for photophobia, pain, discharge and redness.  Respiratory: Negative for cough, shortness of breath, wheezing and stridor.   Cardiovascular: Negative for chest pain, palpitations and leg swelling.  Gastrointestinal: Negative for nausea, vomiting, abdominal pain, diarrhea, constipation and blood in stool.  Endocrine: Negative for polydipsia.  Genitourinary: Positive for frequency. Negative for dysuria, urgency, hematuria and flank pain.  Musculoskeletal: Positive for back pain. Negative for myalgias and neck pain.  Skin: Negative for rash.       Left breast mass  Allergic/Immunologic: Negative for environmental allergies.  Neurological: Negative for dizziness, tremors, seizures, weakness and headaches.  Hematological: Does not bruise/bleed easily.  Psychiatric/Behavioral: Negative for suicidal ideas and hallucinations. The patient is not nervous/anxious.      Past Medical History  Diagnosis Date  . Osteoporosis   . Allergic rhinitis   . Dementia   . Osteoarthritis     hands  . IBS (irritable bowel syndrome)   . GERD (gastroesophageal reflux disease)   . Hypertension   . Palpitation     a.  PAC's & PVC's by Holter - 2011  . Tortuous colon 2002  . Descending thoracic aortic dissection 04/24/2010    a. MRA 2011 - begins distal to left subclavian and extends throughout the full length of the Ao.  Marland Kitchen Aortic insufficiency 04/16/2010    a. Echo 2011 - mild to moderate AI, EF 60%.  . Anemia, unspecified 01/12/2013  . Other abnormal blood chemistry 01/12/2013  . Lumbago 10/06/2012  . Personal history of fall 10/06/2012  . Hyposmolality and/or hyponatremia 09/29/2012  . Otitis externa 09/29/2012  . Impacted cerumen 09/29/2012  . Unspecified conductive hearing loss 09/29/2012  .  External hemorrhoids without mention of complication 09/29/2012  . Unspecified venous (peripheral) insufficiency 09/29/2012  . Unspecified constipation 09/29/2012  . Abnormality of gait 09/29/2012  . Congestive heart failure, unspecified 06/30/2003  . Failure to thrive in adult 04/01/2015   Past Surgical History  Procedure Laterality Date  . Dilation and curettage of uterus  1960  . Cataract extraction w/ intraocular lens implant     Social History:   reports that she has never smoked. She has never used smokeless tobacco. She reports that she does not drink alcohol or use illicit drugs.  Family History  Problem Relation Age of Onset  . Diabetes Mother   . Coronary artery disease Father   . Hypertension Father   . Diabetes      sisters    Medications: Patient's Medications  New Prescriptions   No medications on file  Previous Medications   ACETAMINOPHEN (TYLENOL) 325 MG TABLET    Take 650 mg by mouth every 6 (six) hours as needed.    AMLODIPINE (NORVASC) 5 MG TABLET    Take 1 tablet (5 mg total) by mouth daily.   ARTIFICIAL TEAR SOLUTION (TEARS NATURALE II OP)    Apply 1 drop to eye daily. Both eyes twice daily   AZELASTINE (ASTELIN) 137 MCG/SPRAY NASAL SPRAY    Place 2 sprays into the nose. At bedtime   DOCUSATE SODIUM (COLACE) 100 MG CAPSULE    Take 100 mg by mouth 2 (two) times daily.   DONEPEZIL (ARICEPT) 10 MG TABLET    Take 1 tablet (10 mg total) by mouth daily.   HYDROCODONE-ACETAMINOPHEN (NORCO/VICODIN) 5-325 MG PER TABLET    Take one tablet by mouth once daily for pain   MEMANTINE (NAMENDA) 10 MG TABLET    Take 28 mg by mouth daily.    METOPROLOL (LOPRESSOR) 50 MG TABLET    Take 1 tablet (50 mg total) by mouth 2 (two) times daily.   MIRTAZAPINE (REMERON) 7.5 MG TABLET    Take 7.5 mg by mouth at bedtime.   MULTIPLE VITAMINS-MINERALS (CENTRUM PO)    Take 1 tablet by mouth 1 day or 1 dose.   NUTRITIONAL SUPPLEMENTS (RESOURCE PO)    Take 120 mLs by mouth 2 (two) times daily.  With med pass.   OMEPRAZOLE (PRILOSEC) 20 MG CAPSULE    Take 20 mg by mouth daily.   POLYETHYLENE GLYCOL (MIRALAX / GLYCOLAX) PACKET    Take 17 g by mouth. Fill cap to 17 gm mark, mix with 4 oz of fluid and take by mouth every other day.  Modified Medications   No medications on file  Discontinued Medications   No medications on file     Physical Exam: Physical Exam  Constitutional: She appears well-developed and well-nourished. No distress.  HENT:  Head: Normocephalic and atraumatic.  Nose: Nose normal.  Mouth/Throat: Oropharynx is clear and moist.  No oropharyngeal exudate.  Eyes: Conjunctivae and EOM are normal. Pupils are equal, round, and reactive to light. Right eye exhibits no discharge. Left eye exhibits no discharge. No scleral icterus.  Neck: Normal range of motion. Neck supple. No JVD present. No thyromegaly present.  Cardiovascular: Normal rate and regular rhythm.   Murmur heard. Systolic murmur appreciated at the left sternal border 2/6  Pulmonary/Chest: Effort normal and breath sounds normal. No respiratory distress. She has no wheezes. She has no rales.  Left breast mass.  Abdominal: Soft. Bowel sounds are normal. She exhibits no distension. There is no tenderness.  Musculoskeletal: Normal range of motion. She exhibits tenderness. She exhibits no edema.  Pain in the right shoulder/shoulder blade, neck, middle/lower back-gait instability and frequent falling. C/o aches allover-better since Norco tid. Scoliokyphosis.   Lymphadenopathy:    She has no cervical adenopathy.  Neurological: She is alert. She has normal reflexes. No cranial nerve deficit. She exhibits normal muscle tone. Coordination normal.  Skin: Skin is warm and dry. No rash noted. She is not diaphoretic. No erythema.  Mobile, smooth, a large marble size, non-tender at left breast 7 o'clock  Psychiatric: Her mood appears not anxious. Her affect is inappropriate. Her affect is not angry, not blunt and not  labile. Her speech is delayed. Her speech is not rapid and/or pressured, not tangential and not slurred. She is slowed. She is not agitated, not aggressive, not hyperactive, not withdrawn and not actively hallucinating. Thought content is not paranoid and not delusional. Cognition and memory are impaired. She expresses impulsivity and inappropriate judgment. She does not exhibit a depressed mood. She is communicative. She exhibits abnormal recent memory. She exhibits normal remote memory. She is attentive.    Filed Vitals:   04/15/15 1347  BP: 142/80  Pulse: 60  Temp: 98.4 F (36.9 C)  TempSrc: Tympanic  Resp: 16      Labs reviewed: Basic Metabolic Panel:  Recent Labs  16/10/96 10/02/14  NA 144 141  K 3.7 3.9  BUN 26* 29*  CREATININE 0.7 0.8  TSH  --  0.90   Liver Function Tests:  Recent Labs  10/02/14  AST 12*  ALT 8  ALKPHOS 40   No results for input(s): LIPASE, AMYLASE in the last 8760 hours. No results for input(s): AMMONIA in the last 8760 hours. CBC:  Recent Labs  07/03/14 10/02/14  WBC 5.7 6.1  HGB 12.0 11.9*  HCT 37 37  PLT 112* 130*   Lipid Panel: No results for input(s): CHOL, HDL, LDLCALC, TRIG, CHOLHDL, LDLDIRECT in the last 8760 hours.  Past Procedures:  N/A recent  Assessment/Plan Depression with anxiety 04/01/15 off Mirtazapine and started Cymbalta 30mg  in setting of aches and pains.     Breast lump on left side at 7 o'clock position Mobile, smooth, a large marble size, non-tender at left breast 7 o'clock-will schedule Mammogram to evaluate further.  07/05/14 POA declined Mammogram. Desires no IVF. ABT for comfort measures only. --still mobile and non tender.      Dementia, vascular SNF for care. Continue Aricept and Namenda. MMSE 22/30 10/26/14    Hypertension Controlled on Amlodipine 5mg  and Metoprolol 50mg  bid.       OSTEOARTHROSIS, GENERALIZED, MULTIPLE SITES Hx of c/o  pain in her right shoulder and shoulder blade(no  decreased ROM of the right shoulder) and lower back  Hx of middle of her spine vertebral body got out of alignment from falling on staircase in 1950--chronic on and off pain since  the event. Chronic pain is managed with Norco bid started 04/04/14. Prn Norco is still available to her.  --better controlled with Norco tid. 04/01/15 started Cymbalta may help too.      Constipation MiraLax prn and off Colace  bid 08/22/14. C/o frequent loose stools-Questran tid for now. Hx of IBS --stable    GERD (gastroesophageal reflux disease) Better since off Celebrex and continue Prilosec  daily. 07/07/14 Guaiac stools x3 negative.        Family/ Staff Communication: observe the patient  Goals of Care: SNF  Labs/tests ordered: none

## 2015-04-17 NOTE — Assessment & Plan Note (Signed)
04/01/15 off Mirtazapine and started Cymbalta 30mg  in setting of aches and pains.

## 2015-04-17 NOTE — Assessment & Plan Note (Signed)
MiraLax prn and off Colace 100mg  bid 08/22/14. C/o frequent loose stools-Questran tid for now. Hx of IBS --stable

## 2015-04-17 NOTE — Assessment & Plan Note (Signed)
Hx of c/o  pain in her right shoulder and shoulder blade(no decreased ROM of the right shoulder) and lower back  Hx of middle of her spine vertebral body got out of alignment from falling on staircase in 1950--chronic on and off pain since the event. Chronic pain is managed with Norco bid started 04/04/14. Prn Norco is still available to her.  --better controlled with Norco tid. 04/01/15 started Cymbalta may help too.

## 2015-04-17 NOTE — Assessment & Plan Note (Signed)
Mobile, smooth, a large marble size, non-tender at left breast 7 o'clock-will schedule Mammogram to evaluate further.  07/05/14 POA declined Mammogram. Desires no IVF. ABT for comfort measures only. --still mobile and non tender.

## 2015-05-13 ENCOUNTER — Non-Acute Institutional Stay (SKILLED_NURSING_FACILITY): Payer: Medicare Other | Admitting: Nurse Practitioner

## 2015-05-13 ENCOUNTER — Encounter: Payer: Self-pay | Admitting: Nurse Practitioner

## 2015-05-13 DIAGNOSIS — K219 Gastro-esophageal reflux disease without esophagitis: Secondary | ICD-10-CM

## 2015-05-13 DIAGNOSIS — F0150 Vascular dementia without behavioral disturbance: Secondary | ICD-10-CM | POA: Diagnosis not present

## 2015-05-13 DIAGNOSIS — I10 Essential (primary) hypertension: Secondary | ICD-10-CM | POA: Diagnosis not present

## 2015-05-13 DIAGNOSIS — K5900 Constipation, unspecified: Secondary | ICD-10-CM

## 2015-05-13 DIAGNOSIS — F418 Other specified anxiety disorders: Secondary | ICD-10-CM | POA: Diagnosis not present

## 2015-05-13 DIAGNOSIS — M546 Pain in thoracic spine: Secondary | ICD-10-CM

## 2015-05-13 DIAGNOSIS — N63 Unspecified lump in breast: Secondary | ICD-10-CM

## 2015-05-13 DIAGNOSIS — M159 Polyosteoarthritis, unspecified: Secondary | ICD-10-CM

## 2015-05-13 DIAGNOSIS — N6324 Unspecified lump in the left breast, lower inner quadrant: Secondary | ICD-10-CM

## 2015-05-13 NOTE — Progress Notes (Signed)
Patient ID: Jamie [name], female   DOB: [birth year], 81 y.o.   MRN: [id number]   Code Status: DNR  Allergies  Allergen Reactions  . Alendronate Sodium     REACTION: GI    Chief Complaint  Patient presents with  . Medical Management of Chronic Issues    HPI: Patient is a 81 y.o. female seen in the SNF at [facility] today for evaluation of chronic medical conditions.  Problem List Items Addressed This Visit    Dementia, vascular - Primary (Chronic)    SNF for care. Continue Aricept and Namenda. MMSE 22/30 10/26/14        Hypertension (Chronic)    Controlled on Amlodipine  and Metoprolol  bid.         Depression with anxiety (Chronic)    04/01/15 off Mirtazapine and started Cymbalta  in setting of aches and pains.  --stable.        OSTEOARTHROSIS, GENERALIZED, MULTIPLE SITES    Hx of c/o  pain in her right shoulder and shoulder blade(no decreased ROM of the right shoulder) and lower back  Hx of middle of her spine vertebral body got out of alignment from falling on staircase in 1950--chronic on and off pain since the event. Chronic pain is managed with Norco bid started 04/04/14. Prn Norco is still available to her.  --better controlled with Norco tid. 04/01/15 started Cymbalta may help too.         Constipation    MiraLax prn and off Colace  bid 08/22/14. C/o frequent loose stools-Questran tid for now. Hx of IBS --stable       Thoracic back pain    Hx of c/o  pain in her right shoulder and shoulder blade(no decreased ROM of the right shoulder) and lower back  Hx of middle of her spine vertebral body got out of alignment from falling on staircase in 1950--chronic on and off pain since the event. Chronic pain is managed with Norco bid started 04/04/14. Prn Norco is still available to her.  --better controlled with Norco tid.       GERD (gastroesophageal reflux disease)    Better since off Celebrex and continue Prilosec  daily. 07/07/14 Guaiac  stools x3 negative.          Breast lump on left side at 7 o'clock position    Mobile, smooth, a large marble size, non-tender at left breast 7 o'clock-will schedule Mammogram to evaluate further.  07/05/14 POA declined Mammogram. Desires no IVF. ABT for comfort measures only. --still mobile and non tender.            Review of Systems:  Review of Systems  Constitutional: Negative for fever, chills and diaphoresis.  HENT: Positive for hearing loss. Negative for congestion, ear discharge, ear pain, nosebleeds, sore throat and tinnitus.   Eyes: Negative for photophobia, pain, discharge and redness.  Respiratory: Negative for cough, shortness of breath, wheezing and stridor.   Cardiovascular: Negative for chest pain, palpitations and leg swelling.  Gastrointestinal: Negative for nausea, vomiting, abdominal pain, diarrhea, constipation and blood in stool.  Endocrine: Negative for polydipsia.  Genitourinary: Positive for frequency. Negative for dysuria, urgency, hematuria and flank pain.  Musculoskeletal: Positive for back pain. Negative for myalgias and neck pain.  Skin: Negative for rash.       Left breast mass  Allergic/Immunologic: Negative for environmental allergies.  Neurological: Negative for dizziness, tremors, seizures, weakness and headaches.  Hematological: Does not bruise/bleed easily.  Psychiatric/Behavioral: Negative for  suicidal ideas and hallucinations. The patient is not nervous/anxious.      Past Medical History  Diagnosis Date  . Osteoporosis   . Allergic rhinitis   . Dementia   . Osteoarthritis     hands  . IBS (irritable bowel syndrome)   . GERD (gastroesophageal reflux disease)   . Hypertension   . Palpitation     a. PAC's & PVC's by Holter - 2011  . Tortuous colon 2002  . Descending thoracic aortic dissection 04/24/2010    a. MRA 2011 - begins distal to left subclavian and extends throughout the full length of the Ao.  Marland Kitchen Aortic insufficiency  04/16/2010    a. Echo 2011 - mild to moderate AI, EF 60%.  . Anemia, unspecified 01/12/2013  . Other abnormal blood chemistry 01/12/2013  . Lumbago 10/06/2012  . Personal history of fall 10/06/2012  . Hyposmolality and/or hyponatremia 09/29/2012  . Otitis externa 09/29/2012  . Impacted cerumen 09/29/2012  . Unspecified conductive hearing loss 09/29/2012  . External hemorrhoids without mention of complication 09/29/2012  . Unspecified venous (peripheral) insufficiency 09/29/2012  . Unspecified constipation 09/29/2012  . Abnormality of gait 09/29/2012  . Congestive heart failure, unspecified 06/30/2003  . Failure to thrive in adult 04/01/2015   Past Surgical History  Procedure Laterality Date  . Dilation and curettage of uterus  1960  . Cataract extraction w/ intraocular lens implant     Social History:   reports that she has never smoked. She has never used smokeless tobacco. She reports that she does not drink alcohol or use illicit drugs.  Family History  Problem Relation Age of Onset  . Diabetes Mother   . Coronary artery disease Father   . Hypertension Father   . Diabetes      sisters    Medications: Patient's Medications  New Prescriptions   No medications on file  Previous Medications   ACETAMINOPHEN (TYLENOL) 325 MG TABLET    Take 650 mg by mouth every 6 (six) hours as needed.    AMLODIPINE (NORVASC) 5 MG TABLET    Take 1 tablet (5 mg total) by mouth daily.   ARTIFICIAL TEAR SOLUTION (TEARS NATURALE II OP)    Apply 1 drop to eye daily. Both eyes twice daily   AZELASTINE (ASTELIN) 137 MCG/SPRAY NASAL SPRAY    Place 2 sprays into the nose. At bedtime   DOCUSATE SODIUM (COLACE) 100 MG CAPSULE    Take 100 mg by mouth 2 (two) times daily.   DONEPEZIL (ARICEPT) 10 MG TABLET    Take 1 tablet (10 mg total) by mouth daily.   HYDROCODONE-ACETAMINOPHEN (NORCO/VICODIN) 5-325 MG PER TABLET    Take one tablet by mouth once daily for pain   MEMANTINE (NAMENDA) 10 MG TABLET    Take 28 mg by  mouth daily.    METOPROLOL (LOPRESSOR) 50 MG TABLET    Take 1 tablet (50 mg total) by mouth 2 (two) times daily.   MIRTAZAPINE (REMERON) 7.5 MG TABLET    Take 7.5 mg by mouth at bedtime.   MULTIPLE VITAMINS-MINERALS (CENTRUM PO)    Take 1 tablet by mouth 1 day or 1 dose.   NUTRITIONAL SUPPLEMENTS (RESOURCE PO)    Take 120 mLs by mouth 2 (two) times daily. With med pass.   OMEPRAZOLE (PRILOSEC) 20 MG CAPSULE    Take 20 mg by mouth daily.   POLYETHYLENE GLYCOL (MIRALAX / GLYCOLAX) PACKET    Take 17 g by mouth. Fill cap to 17 gm  mark, mix with 4 oz of fluid and take by mouth every other day.  Modified Medications   No medications on file  Discontinued Medications   No medications on file     Physical Exam: Physical Exam  Constitutional: She appears well-developed and well-nourished. No distress.  HENT:  Head: Normocephalic and atraumatic.  Nose: Nose normal.  Mouth/Throat: Oropharynx is clear and moist. No oropharyngeal exudate.  Eyes: Conjunctivae and EOM are normal. Pupils are equal, round, and reactive to light. Right eye exhibits no discharge. Left eye exhibits no discharge. No scleral icterus.  Neck: Normal range of motion. Neck supple. No JVD present. No thyromegaly present.  Cardiovascular: Normal rate and regular rhythm.   Murmur heard. Systolic murmur appreciated at the left sternal border 2/6  Pulmonary/Chest: Effort normal and breath sounds normal. No respiratory distress. She has no wheezes. She has no rales.  Left breast mass.  Abdominal: Soft. Bowel sounds are normal. She exhibits no distension. There is no tenderness.  Musculoskeletal: Normal range of motion. She exhibits tenderness. She exhibits no edema.  Pain in the right shoulder/shoulder blade, neck, middle/lower back-gait instability and frequent falling. C/o aches allover-better since Norco tid. Scoliokyphosis.   Lymphadenopathy:    She has no cervical adenopathy.  Neurological: She is alert. She has normal  reflexes. No cranial nerve deficit. She exhibits normal muscle tone. Coordination normal.  Skin: Skin is warm and dry. No rash noted. She is not diaphoretic. No erythema.  Mobile, smooth, a large marble size, non-tender at left breast 7 o'clock  Psychiatric: Her mood appears not anxious. Her affect is inappropriate. Her affect is not angry, not blunt and not labile. Her speech is delayed. Her speech is not rapid and/or pressured, not tangential and not slurred. She is slowed. She is not agitated, not aggressive, not hyperactive, not withdrawn and not actively hallucinating. Thought content is not paranoid and not delusional. Cognition and memory are impaired. She expresses impulsivity and inappropriate judgment. She does not exhibit a depressed mood. She is communicative. She exhibits abnormal recent memory. She exhibits normal remote memory. She is attentive.    Filed Vitals:   05/13/15 2041  BP: 130/80  Pulse: 84  Temp: 98.2 F (36.8 C)  TempSrc: Tympanic  Resp: 20      Labs reviewed: Basic Metabolic Panel:  Recent Labs  16/10/96 10/02/14  NA 144 141  K 3.7 3.9  BUN 26* 29*  CREATININE 0.7 0.8  TSH  --  0.90   Liver Function Tests:  Recent Labs  10/02/14  AST 12*  ALT 8  ALKPHOS 40   No results for input(s): LIPASE, AMYLASE in the last 8760 hours. No results for input(s): AMMONIA in the last 8760 hours. CBC:  Recent Labs  07/03/14 10/02/14  WBC 5.7 6.1  HGB 12.0 11.9*  HCT 37 37  PLT 112* 130*   Lipid Panel: No results for input(s): CHOL, HDL, LDLCALC, TRIG, CHOLHDL, LDLDIRECT in the last 8760 hours.  Past Procedures:  N/A recent  Assessment/Plan Dementia, vascular SNF for care. Continue Aricept and Namenda. MMSE 22/30 10/26/14    Hypertension Controlled on Amlodipine  and Metoprolol  bid.     Depression with anxiety 04/01/15 off Mirtazapine and started Cymbalta  in setting of aches and pains.  --stable.    OSTEOARTHROSIS, GENERALIZED,  MULTIPLE SITES Hx of c/o  pain in her right shoulder and shoulder blade(no decreased ROM of the right shoulder) and lower back  Hx of middle of her spine  vertebral body got out of alignment from falling on staircase in 1950--chronic on and off pain since the event. Chronic pain is managed with Norco bid started 04/04/14. Prn Norco is still available to her.  --better controlled with Norco tid. 04/01/15 started Cymbalta may help too.     Constipation MiraLax prn and off Colace 100mg  bid 08/22/14. C/o frequent loose stools-Questran tid for now. Hx of IBS --stable   Thoracic back pain Hx of c/o  pain in her right shoulder and shoulder blade(no decreased ROM of the right shoulder) and lower back  Hx of middle of her spine vertebral body got out of alignment from falling on staircase in 1950--chronic on and off pain since the event. Chronic pain is managed with Norco bid started 04/04/14. Prn Norco is still available to her.  --better controlled with Norco tid.   GERD (gastroesophageal reflux disease) Better since off Celebrex and continue Prilosec 20mg  daily. 07/07/14 Guaiac stools x3 negative.      Breast lump on left side at 7 o'clock position Mobile, smooth, a large marble size, non-tender at left breast 7 o'clock-will schedule Mammogram to evaluate further.  07/05/14 POA declined Mammogram. Desires no IVF. ABT for comfort measures only. --still mobile and non tender.       Family/ Staff Communication: observe the patient  Goals of Care: SNF  Labs/tests ordered: none

## 2015-05-14 ENCOUNTER — Encounter: Payer: Self-pay | Admitting: Nurse Practitioner

## 2015-05-14 NOTE — Assessment & Plan Note (Signed)
MiraLax prn and off Colace 100mg  bid 08/22/14. C/o frequent loose stools-Questran tid for now. Hx of IBS --stable

## 2015-05-14 NOTE — Assessment & Plan Note (Signed)
Hx of c/o  pain in her right shoulder and shoulder blade(no decreased ROM of the right shoulder) and lower back  Hx of middle of her spine vertebral body got out of alignment from falling on staircase in 1950--chronic on and off pain since the event. Chronic pain is managed with Norco bid started 04/04/14. Prn Norco is still available to her.  --better controlled with Norco tid.

## 2015-05-14 NOTE — Assessment & Plan Note (Signed)
Hx of c/o  pain in her right shoulder and shoulder blade(no decreased ROM of the right shoulder) and lower back  Hx of middle of her spine vertebral body got out of alignment from falling on staircase in 1950--chronic on and off pain since the event. Chronic pain is managed with Norco bid started 04/04/14. Prn Norco is still available to her.  --better controlled with Norco tid. 04/01/15 started Cymbalta may help too.

## 2015-05-14 NOTE — Assessment & Plan Note (Signed)
04/01/15 off Mirtazapine and started Cymbalta 30mg in setting of aches and pains.  --stable.  

## 2015-05-14 NOTE — Assessment & Plan Note (Signed)
SNF for care. Continue Aricept and Namenda. MMSE 22/30 10/26/14

## 2015-05-14 NOTE — Assessment & Plan Note (Signed)
Better since off Celebrex and continue Prilosec 20mg daily. 07/07/14 Guaiac stools x3 negative.     

## 2015-05-14 NOTE — Assessment & Plan Note (Signed)
Controlled on Amlodipine 5mg and Metoprolol 50mg bid.     

## 2015-05-14 NOTE — Assessment & Plan Note (Signed)
Mobile, smooth, a large marble size, non-tender at left breast 7 o'clock-will schedule Mammogram to evaluate further.  07/05/14 POA declined Mammogram. Desires no IVF. ABT for comfort measures only. --still mobile and non tender.

## 2015-06-26 ENCOUNTER — Non-Acute Institutional Stay: Payer: Medicare Other | Admitting: Adult Health

## 2015-06-26 DIAGNOSIS — G8929 Other chronic pain: Secondary | ICD-10-CM

## 2015-06-26 DIAGNOSIS — J309 Allergic rhinitis, unspecified: Secondary | ICD-10-CM

## 2015-06-26 DIAGNOSIS — K589 Irritable bowel syndrome without diarrhea: Secondary | ICD-10-CM | POA: Diagnosis not present

## 2015-06-26 DIAGNOSIS — K219 Gastro-esophageal reflux disease without esophagitis: Secondary | ICD-10-CM | POA: Diagnosis not present

## 2015-06-26 DIAGNOSIS — I10 Essential (primary) hypertension: Secondary | ICD-10-CM

## 2015-06-26 DIAGNOSIS — F0150 Vascular dementia without behavioral disturbance: Secondary | ICD-10-CM | POA: Diagnosis not present

## 2015-06-27 DIAGNOSIS — E222 Syndrome of inappropriate secretion of antidiuretic hormone: Secondary | ICD-10-CM | POA: Diagnosis not present

## 2015-07-04 ENCOUNTER — Encounter: Payer: Self-pay | Admitting: Adult Health

## 2015-07-04 MED ORDER — HYDROCODONE-ACETAMINOPHEN 5-325 MG PO TABS: 1.0000 | ORAL_TABLET | Freq: Four times a day (QID) | ORAL | Status: DC

## 2015-07-04 NOTE — Progress Notes (Signed)
Patient ID: Jamie [name], female   DOB: [birth year], 81 y.o.   MRN: [id number]   Facility: [facility]       Allergies  Allergen Reactions  . Alendronate Sodium     REACTION: GI    Chief Complaint  Patient presents with  . Medical Management of Chronic Issues    HPI:  She is a long term resident of this facility being seen for the management of her chronic illnesses. She is complaining of pain "all over". Staff was concerned that she had been confused; but this has resolved.    Past Medical History  Diagnosis Date  . Osteoporosis   . Allergic rhinitis   . Dementia   . Osteoarthritis     hands  . IBS (irritable bowel syndrome)   . GERD (gastroesophageal reflux disease)   . Hypertension   . Palpitation     a. PAC's & PVC's by Holter - 2011  . Tortuous colon 2002  . Descending thoracic aortic dissection 04/24/2010    a. MRA 2011 - begins distal to left subclavian and extends throughout the full length of the Ao.  Marland Kitchen Aortic insufficiency 04/16/2010    a. Echo 2011 - mild to moderate AI, EF 60%.  . Anemia, unspecified 01/12/2013  . Other abnormal blood chemistry 01/12/2013  . Lumbago 10/06/2012  . Personal history of fall 10/06/2012  . Hyposmolality and/or hyponatremia 09/29/2012  . Otitis externa 09/29/2012  . Impacted cerumen 09/29/2012  . Unspecified conductive hearing loss 09/29/2012  . External hemorrhoids without mention of complication 09/29/2012  . Unspecified venous (peripheral) insufficiency 09/29/2012  . Unspecified constipation 09/29/2012  . Abnormality of gait 09/29/2012  . Congestive heart failure, unspecified 06/30/2003  . Failure to thrive in adult 04/01/2015    Past Surgical History  Procedure Laterality Date  . Dilation and curettage of uterus  1960  . Cataract extraction w/ intraocular lens implant      VITAL SIGNS BP 134/72 mmHg  Pulse 78  Ht  (1.651 m)  Wt 105 lb 4.8 oz (47.764 kg)  BMI 17.52 kg/m2  Patient's Medications  New  Prescriptions   No medications on file  Previous Medications   ACETAMINOPHEN (TYLENOL) 325 MG TABLET    Take 650 mg by mouth every 6 (six) hours as needed.    ARTIFICIAL TEAR SOLUTION (TEARS NATURALE II OP)    Apply 1 drop to eye daily. Both eyes twice daily   AZELASTINE (ASTELIN) 137 MCG/SPRAY NASAL SPRAY    Place 2 sprays into the nose. At bedtime   CHOLESTYRAMINE (QUESTRAN) 4 G PACKET    Take 4 g by mouth 3 (three) times daily with meals.   DONEPEZIL (ARICEPT) 10 MG TABLET    Take 1 tablet (10 mg total) by mouth daily.   FAMOTIDINE (PEPCID) 20 MG TABLET    Take 20 mg by mouth at bedtime.   HYDROCODONE-ACETAMINOPHEN (NORCO/VICODIN) 5-325 MG PER TABLET    Take one tablet by mouth once daily for pain   MEMANTINE (NAMENDA) 10 MG TABLET    Take 10 mg by mouth 2 (two) times daily.    METOPROLOL (LOPRESSOR) 50 MG TABLET    Take 1 tablet (50 mg total) by mouth 2 (two) times daily.   NUTRITIONAL SUPPLEMENTS (RESOURCE PO)    Take 120 mLs by mouth 2 (two) times daily. With med pass.   POLYETHYLENE GLYCOL (MIRALAX / GLYCOLAX) PACKET    Take 17 g by mouth daily as needed.  Modified Medications   No medications on file  Discontinued Medications     SIGNIFICANT DIAGNOSTIC EXAMS  LABS REVIEWED:   10-02-14: wbc 6.1; hgb 11.9; hct 36.6; mcv 89.7; lt 130; glucose 92; bun 26; creat 0.75; k+3.9; na++141; liver normal albumin 3.7; tsh 0.905   Review of Systems  Constitutional: Negative for appetite change and fatigue.  Respiratory: Negative for cough, chest tightness and shortness of breath.   Cardiovascular: Negative for chest pain, palpitations and leg swelling.  Gastrointestinal: Negative for nausea, abdominal pain, diarrhea and constipation.  Musculoskeletal: Positive for myalgias and arthralgias.  Skin: Negative for pallor.  Psychiatric/Behavioral: The patient is not nervous/anxious.       Physical Exam  Constitutional: No distress.  Frail   Eyes: Conjunctivae are normal.  Neck: Neck  supple. No JVD present. No thyromegaly present.  Cardiovascular: Normal rate, regular rhythm and intact distal pulses.   Murmur heard. Murmur 2/6  Respiratory: Effort normal and breath sounds normal. No respiratory distress. She has no wheezes.  GI: Soft. Bowel sounds are normal. She exhibits no distension. There is no tenderness.  Musculoskeletal: She exhibits no edema.  Able to move all extremities  Has kyphosis and scoliosis   Lymphadenopathy:    She has no cervical adenopathy.  Neurological: She is alert.  Skin: Skin is warm and dry. She is not diaphoretic.  Psychiatric: She has a normal mood and affect.     ASSESSMENT/ PLAN:  1.  Gerd: will continue pepcid 20 mg nightly   2. Hypertension: will continue lopressor 50 mg twice daily   3. vascular dementia: no change in status; will continue aricept 10 mg nighlty and namenda 10 mg twice daily   4. Allergic rhinitis: will continue astelin nightly   5. IBS: is presently stable will continue questran 4 gm three times daily   6. Chronic pain: will increase her vicodin to 5/325 mg four times daily    Will check cbc; cmp    Time spent with patient  45  minutes >50% time spent counseling; reviewing medical record; tests; labs; and developing future plan of care     Synthia Innocent NP [facility]  Contact [phone] Monday through Friday 8am- 5pm  After hours call [phone]

## 2015-07-08 DIAGNOSIS — E871 Hypo-osmolality and hyponatremia: Secondary | ICD-10-CM | POA: Diagnosis not present

## 2015-07-08 DIAGNOSIS — I10 Essential (primary) hypertension: Secondary | ICD-10-CM | POA: Diagnosis not present

## 2015-07-08 DIAGNOSIS — R269 Unspecified abnormalities of gait and mobility: Secondary | ICD-10-CM | POA: Diagnosis not present

## 2015-07-08 DIAGNOSIS — R2681 Unsteadiness on feet: Secondary | ICD-10-CM | POA: Diagnosis not present

## 2015-07-08 DIAGNOSIS — H902 Conductive hearing loss, unspecified: Secondary | ICD-10-CM | POA: Diagnosis not present

## 2015-07-08 DIAGNOSIS — F0390 Unspecified dementia without behavioral disturbance: Secondary | ICD-10-CM | POA: Diagnosis not present

## 2015-07-08 DIAGNOSIS — R296 Repeated falls: Secondary | ICD-10-CM | POA: Diagnosis not present

## 2015-07-08 DIAGNOSIS — M6281 Muscle weakness (generalized): Secondary | ICD-10-CM | POA: Diagnosis not present

## 2015-07-11 DIAGNOSIS — F0390 Unspecified dementia without behavioral disturbance: Secondary | ICD-10-CM | POA: Diagnosis not present

## 2015-07-11 DIAGNOSIS — E871 Hypo-osmolality and hyponatremia: Secondary | ICD-10-CM | POA: Diagnosis not present

## 2015-07-11 DIAGNOSIS — R296 Repeated falls: Secondary | ICD-10-CM | POA: Diagnosis not present

## 2015-07-11 DIAGNOSIS — H902 Conductive hearing loss, unspecified: Secondary | ICD-10-CM | POA: Diagnosis not present

## 2015-07-11 DIAGNOSIS — M6281 Muscle weakness (generalized): Secondary | ICD-10-CM | POA: Diagnosis not present

## 2015-07-11 DIAGNOSIS — R2681 Unsteadiness on feet: Secondary | ICD-10-CM | POA: Diagnosis not present

## 2015-07-12 DIAGNOSIS — F0390 Unspecified dementia without behavioral disturbance: Secondary | ICD-10-CM | POA: Diagnosis not present

## 2015-07-12 DIAGNOSIS — R296 Repeated falls: Secondary | ICD-10-CM | POA: Diagnosis not present

## 2015-07-12 DIAGNOSIS — R2681 Unsteadiness on feet: Secondary | ICD-10-CM | POA: Diagnosis not present

## 2015-07-12 DIAGNOSIS — E871 Hypo-osmolality and hyponatremia: Secondary | ICD-10-CM | POA: Diagnosis not present

## 2015-07-12 DIAGNOSIS — M6281 Muscle weakness (generalized): Secondary | ICD-10-CM | POA: Diagnosis not present

## 2015-07-12 DIAGNOSIS — H902 Conductive hearing loss, unspecified: Secondary | ICD-10-CM | POA: Diagnosis not present

## 2015-07-15 DIAGNOSIS — H902 Conductive hearing loss, unspecified: Secondary | ICD-10-CM | POA: Diagnosis not present

## 2015-07-15 DIAGNOSIS — R2681 Unsteadiness on feet: Secondary | ICD-10-CM | POA: Diagnosis not present

## 2015-07-15 DIAGNOSIS — M6281 Muscle weakness (generalized): Secondary | ICD-10-CM | POA: Diagnosis not present

## 2015-07-15 DIAGNOSIS — R296 Repeated falls: Secondary | ICD-10-CM | POA: Diagnosis not present

## 2015-07-15 DIAGNOSIS — E871 Hypo-osmolality and hyponatremia: Secondary | ICD-10-CM | POA: Diagnosis not present

## 2015-07-15 DIAGNOSIS — F0390 Unspecified dementia without behavioral disturbance: Secondary | ICD-10-CM | POA: Diagnosis not present

## 2015-07-18 DIAGNOSIS — F0390 Unspecified dementia without behavioral disturbance: Secondary | ICD-10-CM | POA: Diagnosis not present

## 2015-07-18 DIAGNOSIS — H902 Conductive hearing loss, unspecified: Secondary | ICD-10-CM | POA: Diagnosis not present

## 2015-07-18 DIAGNOSIS — R296 Repeated falls: Secondary | ICD-10-CM | POA: Diagnosis not present

## 2015-07-18 DIAGNOSIS — R2681 Unsteadiness on feet: Secondary | ICD-10-CM | POA: Diagnosis not present

## 2015-07-18 DIAGNOSIS — E871 Hypo-osmolality and hyponatremia: Secondary | ICD-10-CM | POA: Diagnosis not present

## 2015-07-18 DIAGNOSIS — M6281 Muscle weakness (generalized): Secondary | ICD-10-CM | POA: Diagnosis not present

## 2015-07-19 DIAGNOSIS — E871 Hypo-osmolality and hyponatremia: Secondary | ICD-10-CM | POA: Diagnosis not present

## 2015-07-19 DIAGNOSIS — F0390 Unspecified dementia without behavioral disturbance: Secondary | ICD-10-CM | POA: Diagnosis not present

## 2015-07-19 DIAGNOSIS — R2681 Unsteadiness on feet: Secondary | ICD-10-CM | POA: Diagnosis not present

## 2015-07-19 DIAGNOSIS — H902 Conductive hearing loss, unspecified: Secondary | ICD-10-CM | POA: Diagnosis not present

## 2015-07-19 DIAGNOSIS — M6281 Muscle weakness (generalized): Secondary | ICD-10-CM | POA: Diagnosis not present

## 2015-07-19 DIAGNOSIS — R296 Repeated falls: Secondary | ICD-10-CM | POA: Diagnosis not present

## 2015-07-22 DIAGNOSIS — H902 Conductive hearing loss, unspecified: Secondary | ICD-10-CM | POA: Diagnosis not present

## 2015-07-22 DIAGNOSIS — R2681 Unsteadiness on feet: Secondary | ICD-10-CM | POA: Diagnosis not present

## 2015-07-22 DIAGNOSIS — R296 Repeated falls: Secondary | ICD-10-CM | POA: Diagnosis not present

## 2015-07-22 DIAGNOSIS — M6281 Muscle weakness (generalized): Secondary | ICD-10-CM | POA: Diagnosis not present

## 2015-07-22 DIAGNOSIS — E871 Hypo-osmolality and hyponatremia: Secondary | ICD-10-CM | POA: Diagnosis not present

## 2015-07-22 DIAGNOSIS — F0390 Unspecified dementia without behavioral disturbance: Secondary | ICD-10-CM | POA: Diagnosis not present

## 2015-07-23 DIAGNOSIS — F0390 Unspecified dementia without behavioral disturbance: Secondary | ICD-10-CM | POA: Diagnosis not present

## 2015-07-23 DIAGNOSIS — H902 Conductive hearing loss, unspecified: Secondary | ICD-10-CM | POA: Diagnosis not present

## 2015-07-23 DIAGNOSIS — R2681 Unsteadiness on feet: Secondary | ICD-10-CM | POA: Diagnosis not present

## 2015-07-23 DIAGNOSIS — E871 Hypo-osmolality and hyponatremia: Secondary | ICD-10-CM | POA: Diagnosis not present

## 2015-07-23 DIAGNOSIS — M6281 Muscle weakness (generalized): Secondary | ICD-10-CM | POA: Diagnosis not present

## 2015-07-23 DIAGNOSIS — R296 Repeated falls: Secondary | ICD-10-CM | POA: Diagnosis not present

## 2015-07-24 DIAGNOSIS — E871 Hypo-osmolality and hyponatremia: Secondary | ICD-10-CM | POA: Diagnosis not present

## 2015-07-24 DIAGNOSIS — F0390 Unspecified dementia without behavioral disturbance: Secondary | ICD-10-CM | POA: Diagnosis not present

## 2015-07-24 DIAGNOSIS — R296 Repeated falls: Secondary | ICD-10-CM | POA: Diagnosis not present

## 2015-07-24 DIAGNOSIS — R2681 Unsteadiness on feet: Secondary | ICD-10-CM | POA: Diagnosis not present

## 2015-07-24 DIAGNOSIS — H902 Conductive hearing loss, unspecified: Secondary | ICD-10-CM | POA: Diagnosis not present

## 2015-07-24 DIAGNOSIS — M6281 Muscle weakness (generalized): Secondary | ICD-10-CM | POA: Diagnosis not present

## 2015-07-29 DIAGNOSIS — R269 Unspecified abnormalities of gait and mobility: Secondary | ICD-10-CM | POA: Diagnosis not present

## 2015-07-29 DIAGNOSIS — M6281 Muscle weakness (generalized): Secondary | ICD-10-CM | POA: Diagnosis not present

## 2015-07-29 DIAGNOSIS — I10 Essential (primary) hypertension: Secondary | ICD-10-CM | POA: Diagnosis not present

## 2015-07-29 DIAGNOSIS — R2681 Unsteadiness on feet: Secondary | ICD-10-CM | POA: Diagnosis not present

## 2015-07-29 DIAGNOSIS — H902 Conductive hearing loss, unspecified: Secondary | ICD-10-CM | POA: Diagnosis not present

## 2015-07-29 DIAGNOSIS — E871 Hypo-osmolality and hyponatremia: Secondary | ICD-10-CM | POA: Diagnosis not present

## 2015-07-29 DIAGNOSIS — F0390 Unspecified dementia without behavioral disturbance: Secondary | ICD-10-CM | POA: Diagnosis not present

## 2015-07-29 DIAGNOSIS — R296 Repeated falls: Secondary | ICD-10-CM | POA: Diagnosis not present

## 2015-07-31 DIAGNOSIS — H902 Conductive hearing loss, unspecified: Secondary | ICD-10-CM | POA: Diagnosis not present

## 2015-07-31 DIAGNOSIS — R2681 Unsteadiness on feet: Secondary | ICD-10-CM | POA: Diagnosis not present

## 2015-07-31 DIAGNOSIS — F0390 Unspecified dementia without behavioral disturbance: Secondary | ICD-10-CM | POA: Diagnosis not present

## 2015-07-31 DIAGNOSIS — R296 Repeated falls: Secondary | ICD-10-CM | POA: Diagnosis not present

## 2015-07-31 DIAGNOSIS — M6281 Muscle weakness (generalized): Secondary | ICD-10-CM | POA: Diagnosis not present

## 2015-07-31 DIAGNOSIS — E871 Hypo-osmolality and hyponatremia: Secondary | ICD-10-CM | POA: Diagnosis not present

## 2015-08-01 DIAGNOSIS — H902 Conductive hearing loss, unspecified: Secondary | ICD-10-CM | POA: Diagnosis not present

## 2015-08-01 DIAGNOSIS — R2681 Unsteadiness on feet: Secondary | ICD-10-CM | POA: Diagnosis not present

## 2015-08-01 DIAGNOSIS — F0390 Unspecified dementia without behavioral disturbance: Secondary | ICD-10-CM | POA: Diagnosis not present

## 2015-08-01 DIAGNOSIS — E871 Hypo-osmolality and hyponatremia: Secondary | ICD-10-CM | POA: Diagnosis not present

## 2015-08-01 DIAGNOSIS — M6281 Muscle weakness (generalized): Secondary | ICD-10-CM | POA: Diagnosis not present

## 2015-08-01 DIAGNOSIS — R296 Repeated falls: Secondary | ICD-10-CM | POA: Diagnosis not present

## 2015-08-05 DIAGNOSIS — R2681 Unsteadiness on feet: Secondary | ICD-10-CM | POA: Diagnosis not present

## 2015-08-05 DIAGNOSIS — R5381 Other malaise: Secondary | ICD-10-CM | POA: Diagnosis not present

## 2015-08-05 DIAGNOSIS — E871 Hypo-osmolality and hyponatremia: Secondary | ICD-10-CM | POA: Diagnosis not present

## 2015-08-05 DIAGNOSIS — R296 Repeated falls: Secondary | ICD-10-CM | POA: Diagnosis not present

## 2015-08-05 DIAGNOSIS — M6281 Muscle weakness (generalized): Secondary | ICD-10-CM | POA: Diagnosis not present

## 2015-08-05 DIAGNOSIS — F0390 Unspecified dementia without behavioral disturbance: Secondary | ICD-10-CM | POA: Diagnosis not present

## 2015-08-05 DIAGNOSIS — H902 Conductive hearing loss, unspecified: Secondary | ICD-10-CM | POA: Diagnosis not present

## 2015-08-06 ENCOUNTER — Encounter: Payer: Self-pay | Admitting: Nurse Practitioner

## 2015-08-06 ENCOUNTER — Non-Acute Institutional Stay (SKILLED_NURSING_FACILITY): Payer: Medicare Other | Admitting: Nurse Practitioner

## 2015-08-06 DIAGNOSIS — M546 Pain in thoracic spine: Secondary | ICD-10-CM

## 2015-08-06 DIAGNOSIS — K219 Gastro-esophageal reflux disease without esophagitis: Secondary | ICD-10-CM | POA: Diagnosis not present

## 2015-08-06 DIAGNOSIS — F0150 Vascular dementia without behavioral disturbance: Secondary | ICD-10-CM

## 2015-08-06 DIAGNOSIS — K589 Irritable bowel syndrome without diarrhea: Secondary | ICD-10-CM | POA: Diagnosis not present

## 2015-08-06 DIAGNOSIS — F418 Other specified anxiety disorders: Secondary | ICD-10-CM

## 2015-08-06 DIAGNOSIS — I10 Essential (primary) hypertension: Secondary | ICD-10-CM | POA: Diagnosis not present

## 2015-08-06 NOTE — Assessment & Plan Note (Signed)
better controlled, continue Norco tid.

## 2015-08-06 NOTE — Progress Notes (Signed)
Patient ID: Jamie [name], female   DOB: 13-Sep-1920, 81 y.o.   MRN: [id number]  Location:  SNF [facility] Provider:  Chipper Oman NP  Code Status:  DNR Goals of care: Advanced Directive information    Chief Complaint  Patient presents with  . Medical Management of Chronic Issues  . Acute Visit    loose stools.      HPI: Patient is a 81 y.o. female seen in the SNF at [facility] today for evaluation of  Loose stools with foul odor x 3 days and other chronic medical conditions.   Review of Systems:  Review of Systems  Constitutional: Positive for weight loss. Negative for fever, chills and diaphoresis.  HENT: Positive for hearing loss. Negative for congestion, ear discharge, ear pain and tinnitus.   Eyes: Negative for photophobia, pain, discharge and redness.  Respiratory: Negative for cough, shortness of breath and wheezing.   Cardiovascular: Negative for chest pain, palpitations and leg swelling.  Gastrointestinal: Positive for diarrhea. Negative for nausea, vomiting, abdominal pain and blood in stool.  Genitourinary: Positive for frequency. Negative for dysuria, urgency and flank pain.  Musculoskeletal: Positive for back pain. Negative for myalgias and neck pain.  Skin: Negative for rash.       Left breast mass  Neurological: Negative for dizziness, tremors, seizures, weakness and headaches.  Psychiatric/Behavioral: Positive for memory loss. Negative for suicidal ideas and hallucinations. The patient is not nervous/anxious.     Past Medical History  Diagnosis Date  . Osteoporosis   . Allergic rhinitis   . Dementia   . Osteoarthritis     hands  . IBS (irritable bowel syndrome)   . GERD (gastroesophageal reflux disease)   . Hypertension   . Palpitation     a. PAC's & PVC's by Holter - 2011  . Tortuous colon 2002  . Descending thoracic aortic dissection 04/24/2010    a. MRA 2011 - begins distal to left subclavian and extends throughout the full length of the Ao.  Marland Kitchen Aortic  insufficiency 04/16/2010    a. Echo 2011 - mild to moderate AI, EF 60%.  . Anemia, unspecified 01/12/2013  . Other abnormal blood chemistry 01/12/2013  . Lumbago 10/06/2012  . Personal history of fall 10/06/2012  . Hyposmolality and/or hyponatremia 09/29/2012  . Otitis externa 09/29/2012  . Impacted cerumen 09/29/2012  . Unspecified conductive hearing loss 09/29/2012  . External hemorrhoids without mention of complication 09/29/2012  . Unspecified venous (peripheral) insufficiency 09/29/2012  . Unspecified constipation 09/29/2012  . Abnormality of gait 09/29/2012  . Congestive heart failure, unspecified 06/30/2003  . Failure to thrive in adult 04/01/2015    Patient Active Problem List   Diagnosis Date Noted  . Loss of weight 10/01/2014  . Breast lump on left side at 7 o'clock position 07/02/2014  . Left hip pain 06/04/2014  . Depression with anxiety 12/06/2013  . Gait disorder 09/29/2013  . Fall 08/31/2013  . GERD (gastroesophageal reflux disease) 07/03/2013  . Urinary tract infection, site not specified 05/29/2013  . Unspecified constipation 05/29/2013  . DNR (do not resuscitate) 04/13/2013  . Anemia 03/08/2013  . Hypertension   . Constipation 05/25/2011  . Thoracic back pain 05/25/2011  . Descending thoracic aortic dissection 04/24/2010  . Aortic insufficiency 04/16/2010  . CARDIOMEGALY 03/12/2010  . PALPITATIONS, HX OF 02/03/2010  . Vitamin D deficiency 01/07/2010  . DISTURBANCE, VISUAL NOS 06/27/2007  . OSTEOARTHROSIS, GENERALIZED, MULTIPLE SITES 06/27/2007  . Dementia, vascular 03/07/2007  . Allergic rhinitis 03/07/2007  .  IRRITABLE BOWEL SYNDROME 03/07/2007  . OVERACTIVE BLADDER 03/07/2007  . OSTEOPOROSIS 03/07/2007    Allergies  Allergen Reactions  . Alendronate Sodium     REACTION: GI    Medications: Patient's Medications  New Prescriptions   No medications on file  Previous Medications   ACETAMINOPHEN (TYLENOL) 325 MG TABLET    Take 650 mg by mouth every 6 (six)  hours as needed.    ARTIFICIAL TEAR SOLUTION (TEARS NATURALE II OP)    Apply 1 drop to eye daily. Both eyes twice daily   AZELASTINE (ASTELIN) 137 MCG/SPRAY NASAL SPRAY    Place 2 sprays into the nose. At bedtime   CHOLESTYRAMINE (QUESTRAN) 4 G PACKET    Take 4 g by mouth 3 (three) times daily with meals.   DONEPEZIL (ARICEPT) 10 MG TABLET    Take 1 tablet (10 mg total) by mouth daily.   FAMOTIDINE (PEPCID) 20 MG TABLET    Take 20 mg by mouth at bedtime.   HYDROCODONE-ACETAMINOPHEN (NORCO/VICODIN) 5-325 MG PER TABLET    Take 1 tablet by mouth 4 (four) times daily.   MEMANTINE (NAMENDA) 10 MG TABLET    Take 10 mg by mouth 2 (two) times daily.    METOPROLOL (LOPRESSOR) 50 MG TABLET    Take 1 tablet (50 mg total) by mouth 2 (two) times daily.   NUTRITIONAL SUPPLEMENTS (RESOURCE PO)    Take 120 mLs by mouth 2 (two) times daily. With med pass.   POLYETHYLENE GLYCOL (MIRALAX / GLYCOLAX) PACKET    Take 17 g by mouth daily as needed.   Modified Medications   No medications on file  Discontinued Medications   No medications on file    Physical Exam: Filed Vitals:   08/06/15 1445  BP: 128/77  Pulse: 63  Temp: 98.2 F (36.8 C)  TempSrc: Tympanic  Resp: 16   There is no weight on file to calculate BMI.  Physical Exam  Constitutional: She appears well-developed and well-nourished. No distress.  HENT:  Head: Normocephalic and atraumatic.  Nose: Nose normal.  Mouth/Throat: Oropharynx is clear and moist. No oropharyngeal exudate.  Eyes: Conjunctivae and EOM are normal. Pupils are equal, round, and reactive to light. Right eye exhibits no discharge. Left eye exhibits no discharge. No scleral icterus.  Neck: Normal range of motion. Neck supple. No JVD present. No thyromegaly present.  Cardiovascular: Normal rate and regular rhythm.   Murmur heard. Systolic murmur appreciated at the left sternal border 2/6  Pulmonary/Chest: Effort normal and breath sounds normal. No respiratory distress. She has  no wheezes. She has no rales.  Left breast mass.  Abdominal: Soft. Bowel sounds are normal. She exhibits no distension. There is no tenderness.  Musculoskeletal: Normal range of motion. She exhibits tenderness. She exhibits no edema.  Pain in the right shoulder/shoulder blade, neck, middle/lower back-gait instability and frequent falling. C/o aches allover-better since Norco tid. Scoliokyphosis.   Lymphadenopathy:    She has no cervical adenopathy.  Neurological: She is alert. She has normal reflexes. No cranial nerve deficit. She exhibits normal muscle tone. Coordination normal.  Skin: Skin is warm and dry. No rash noted. She is not diaphoretic. No erythema.  Mobile, smooth, a large marble size, non-tender at left breast 7 o'clock  Psychiatric: Her mood appears not anxious. Her affect is inappropriate. Her affect is not angry, not blunt and not labile. Her speech is delayed. Her speech is not rapid and/or pressured, not tangential and not slurred. She is slowed. She is  not agitated, not aggressive, not hyperactive, not withdrawn and not actively hallucinating. Thought content is not paranoid and not delusional. Cognition and memory are impaired. She expresses impulsivity and inappropriate judgment. She does not exhibit a depressed mood. She is communicative. She exhibits abnormal recent memory. She exhibits normal remote memory. She is attentive.    Labs reviewed: Basic Metabolic Panel:  Recent Labs  16/10/96  NA 141  K 3.9  BUN 29*  CREATININE 0.8    Liver Function Tests:  Recent Labs  10/02/14  AST 12*  ALT 8  ALKPHOS 40    CBC:  Recent Labs  10/02/14  WBC 6.1  HGB 11.9*  HCT 37  PLT 130*    Lab Results  Component Value Date   TSH 0.90 10/02/2014   Lab Results  Component Value Date   HGBA1C 5.9 01/17/2013   Lab Results  Component Value Date   CHOL 181 01/07/2010   HDL 93.60 01/07/2010   LDLCALC 78 01/07/2010   TRIG 49.0 01/07/2010   CHOLHDL 2 01/07/2010     Significant Diagnostic Results since last visit: none  Patient Care Team: Kimber Relic, MD as PCP - General (Internal Medicine) Laurey Morale, MD as Consulting Physician (Cardiology)  Assessment/Plan Problem List Items Addressed This Visit    Dementia, vascular - Primary (Chronic)    SNF for care. Continue Aricept and Namenda. MMSE 22/30 10/26/14      Hypertension (Chronic)    Controlled, continue Amlodipine 5mg  and Metoprolol 50mg  bid.      Depression with anxiety (Chronic)    04/01/15 off Mirtazapine and started Cymbalta 30mg  in setting of aches and pains.  --stable.       IRRITABLE BOWEL SYNDROME    Takes questran tid, loose stools with foul odor x 3 days, last one was 1 pm today, Imodium prn, will obtain C-diff Toxin A/B x 3, update CBC, CMP in am.       Thoracic back pain    better controlled, continue Norco tid.       GERD (gastroesophageal reflux disease)    Stable, continue Omeprazole.           Family/ staff Communication: continue to observe loose stools  Labs/tests ordered: CBC and CMP  ManXie Ike Maragh NP Geriatrics [facility] [address], [place] [address] On Call:  [phone] & follow prompts after 5pm & weekends Office Phone:  [phone] Office Fax:  [phone]

## 2015-08-06 NOTE — Assessment & Plan Note (Signed)
Takes questran tid, loose stools with foul odor x 3 days, last one was 1 pm today, Imodium prn, will obtain C-diff Toxin A/B x 3, update CBC, CMP in am.

## 2015-08-06 NOTE — Assessment & Plan Note (Signed)
Controlled, continue Amlodipine 5mg and Metoprolol 50mg bid. 

## 2015-08-06 NOTE — Assessment & Plan Note (Signed)
SNF for care. Continue Aricept and Namenda. MMSE 22/30 10/26/14  

## 2015-08-06 NOTE — Assessment & Plan Note (Signed)
04/01/15 off Mirtazapine and started Cymbalta 30mg in setting of aches and pains.  --stable.  

## 2015-08-06 NOTE — Assessment & Plan Note (Signed)
Stable, continue Omeprazole.  

## 2015-08-08 DIAGNOSIS — I10 Essential (primary) hypertension: Secondary | ICD-10-CM | POA: Diagnosis not present

## 2015-08-10 DIAGNOSIS — K589 Irritable bowel syndrome without diarrhea: Secondary | ICD-10-CM | POA: Diagnosis not present

## 2015-08-16 ENCOUNTER — Non-Acute Institutional Stay (SKILLED_NURSING_FACILITY): Payer: Medicare Other | Admitting: Internal Medicine

## 2015-08-16 ENCOUNTER — Encounter: Payer: Self-pay | Admitting: Internal Medicine

## 2015-08-16 DIAGNOSIS — R197 Diarrhea, unspecified: Secondary | ICD-10-CM

## 2015-08-16 DIAGNOSIS — K589 Irritable bowel syndrome without diarrhea: Secondary | ICD-10-CM | POA: Diagnosis not present

## 2015-08-19 DIAGNOSIS — R197 Diarrhea, unspecified: Secondary | ICD-10-CM | POA: Insufficient documentation

## 2015-08-19 NOTE — Progress Notes (Addendum)
Patient ID: Jamie [name], female   DOB: 1920/03/12, 81 y.o.   MRN: [id number]    [place] Room Number: 22  Place of Service: SNF (31)     Allergies  Allergen Reactions  . Alendronate Sodium     REACTION: GI    Chief Complaint  Patient presents with  . Acute Visit    diarrhea    HPI:  Jamie [name] patient acutely by staff for a problem of diarrhea up to 6 times per day for the last several days. Stools are green in color. She does not seem to have any abdominal discomfort. Clostridium difficile was negative 08/10/2015. Additional stool samples have been ordered but have not returned at this point. She has been using Imodium with some success. She is felt Florastor since 07/29/2015. She was treated with Septra DS twice daily between 08/08/2015 and 08/15/2015.  She has remained afebrile throughout this period of time.  Medications: Patient's Medications  New Prescriptions   No medications on file  Previous Medications   ACETAMINOPHEN (TYLENOL) 325 MG TABLET    Take 650 mg by mouth every 6 (six) hours as needed.    AMLODIPINE (NORVASC) 5 MG TABLET    Take 5 mg by mouth daily.   ARTIFICIAL TEAR SOLUTION (TEARS NATURALE II OP)    Apply 1 drop to eye daily. Both eyes twice daily   AZELASTINE (ASTELIN) 137 MCG/SPRAY NASAL SPRAY    Place 2 sprays into the nose. At bedtime   CHOLESTYRAMINE (QUESTRAN) 4 G PACKET    Take 4 g by mouth 3 (three) times daily with meals.   DONEPEZIL (ARICEPT) 10 MG TABLET    Take 1 tablet (10 mg total) by mouth daily.   DULOXETINE (CYMBALTA) 30 MG CAPSULE    Take 30 mg by mouth daily.   FAMOTIDINE (PEPCID) 20 MG TABLET    Take 20 mg by mouth at bedtime.   HYDROCODONE-ACETAMINOPHEN (NORCO/VICODIN) 5-325 MG PER TABLET    Take 1 tablet by mouth 4 (four) times daily.   MEMANTINE (NAMENDA) 10 MG TABLET    Take 10 mg by mouth 2 (two) times daily.    METOPROLOL (LOPRESSOR) 50 MG TABLET    Take 1 tablet (50 mg total) by mouth 2 (two) times  daily.   NUTRITIONAL SUPPLEMENTS (RESOURCE PO)    Take 120 mLs by mouth 2 (two) times daily. With med pass.   POLYETHYLENE GLYCOL (MIRALAX / GLYCOLAX) PACKET    Take 17 g by mouth daily as needed.   Modified Medications   No medications on file  Discontinued Medications   No medications on file     Review of Systems  Constitutional: Negative for fever, chills and diaphoresis.  HENT: Positive for hearing loss. Negative for congestion, ear discharge, ear pain and tinnitus.   Eyes: Negative for photophobia, pain, discharge and redness.  Respiratory: Negative for cough, shortness of breath and wheezing.   Cardiovascular: Negative for chest pain, palpitations and leg swelling.  Gastrointestinal: Positive for diarrhea. Negative for nausea, vomiting, abdominal pain and blood in stool.  Genitourinary: Positive for frequency. Negative for dysuria, urgency and flank pain.  Musculoskeletal: Positive for back pain. Negative for myalgias and neck pain.  Skin: Negative for rash.       Left breast mass  Neurological: Negative for dizziness, tremors, seizures, weakness and headaches.  Psychiatric/Behavioral: Negative for suicidal ideas and hallucinations. The patient is not nervous/anxious.     Filed Vitals:   08/16/15 1655  BP: 134/72  Pulse: 78  Temp: 98.1 F (36.7 C)  Resp: 20  Weight: 103 lb (46.72 kg)   Body mass index is 17.14 kg/(m^2).  Physical Exam  Constitutional: She appears well-developed and well-nourished. No distress.  HENT:  Head: Normocephalic and atraumatic.  Nose: Nose normal.  Mouth/Throat: Oropharynx is clear and moist. No oropharyngeal exudate.  Eyes: Conjunctivae and EOM are normal. Pupils are equal, round, and reactive to light. Right eye exhibits no discharge. Left eye exhibits no discharge. No scleral icterus.  Neck: Normal range of motion. Neck supple. No JVD present. No thyromegaly present.  Cardiovascular: Normal rate and regular rhythm.   Murmur  heard. Systolic murmur appreciated at the left sternal border 2/6  Pulmonary/Chest: Effort normal and breath sounds normal. No respiratory distress. She has no wheezes. She has no rales.  Left breast mass.  Abdominal: Soft. Bowel sounds are normal. She exhibits no distension and no mass. There is no tenderness. There is no rebound and no guarding.  Musculoskeletal: Normal range of motion. She exhibits tenderness. She exhibits no edema.  Pain in the right shoulder/shoulder blade, neck, middle/lower back-gait instability and frequent falling. C/o aches allover-better since Norco tid. Scoliokyphosis.   Lymphadenopathy:    She has no cervical adenopathy.  Neurological: She is alert. She has normal reflexes. No cranial nerve deficit. She exhibits normal muscle tone. Coordination normal.  Skin: Skin is warm and dry. No rash noted. She is not diaphoretic. No erythema.  Mobile, smooth, a large marble size, non-tender at left breast 7 o'clock  Psychiatric: Her mood appears not anxious. Her affect is inappropriate. Her affect is not angry, not blunt and not labile. Her speech is delayed. Her speech is not rapid and/or pressured, not tangential and not slurred. She is slowed. She is not agitated, not aggressive, not hyperactive, not withdrawn and not actively hallucinating. Thought content is not paranoid and not delusional. Cognition and memory are impaired. She expresses impulsivity and inappropriate judgment. She does not exhibit a depressed mood. She is communicative. She exhibits abnormal recent memory. She exhibits normal remote memory. She is attentive.     Labs reviewed: Lab Summary Latest Ref Rng 10/02/2014 07/03/2014 03/23/2014  Hemoglobin 12.0 - 16.0 g/dL 11.9(A) 12.0 12.7  Hematocrit 36 - 46 % 37 37 39  White count - 6.1 5.7 5.8  Platelet count 150 - 399 K/L 130(A) 112(A) 123(A)  Sodium 137 - 147 mmol/L 141 144 138  Potassium 3.4 - 5.3 mmol/L 3.9 3.7 4.5  Calcium - (None) (None) (None)   Phosphorus - (None) (None) (None)  Creatinine 0.5 - 1.1 mg/dL 0.8 0.7 0.8  AST 13 - 35 U/L 12(A) (None) 15  Alk Phos 25 - 125 U/L 40 (None) 42  Bilirubin - (None) (None) (None)  Glucose - 92 78 89  Cholesterol - (None) (None) (None)  HDL cholesterol - (None) (None) (None)  Triglycerides - (None) (None) (None)  LDL Direct - (None) (None) (None)  LDL Calc - (None) (None) (None)  Total protein - (None) (None) (None)  Albumin - (None) (None) (None)   Lab Results  Component Value Date   TSH 0.90 10/02/2014   Lab Results  Component Value Date   BUN 29* 10/02/2014   Lab Results  Component Value Date   HGBA1C 5.9 01/17/2013       Assessment/Plan 1. Diarrhea Acute visit for diarrhea over the last several days. We will continue Florastor. Since his likely be a few days prior to the return  of the C. difficile reports, metronidazole 500 mg 3 times a day 7 days was started empirically. This was on account of the previous antibiotics course of Septra DS that preceded the current problems of diarrhea.

## 2015-08-20 NOTE — Addendum Note (Signed)
Addended by: Kimber Relic on: 08/20/2015 12:35 PM   Modules accepted: Level of Service

## 2015-08-28 ENCOUNTER — Encounter: Payer: Self-pay | Admitting: Nurse Practitioner

## 2015-08-28 ENCOUNTER — Non-Acute Institutional Stay (SKILLED_NURSING_FACILITY): Payer: Medicare Other | Admitting: Nurse Practitioner

## 2015-08-28 DIAGNOSIS — I10 Essential (primary) hypertension: Secondary | ICD-10-CM

## 2015-08-28 DIAGNOSIS — F418 Other specified anxiety disorders: Secondary | ICD-10-CM

## 2015-08-28 DIAGNOSIS — K219 Gastro-esophageal reflux disease without esophagitis: Secondary | ICD-10-CM

## 2015-08-28 DIAGNOSIS — F0150 Vascular dementia without behavioral disturbance: Secondary | ICD-10-CM | POA: Diagnosis not present

## 2015-08-28 DIAGNOSIS — R338 Other retention of urine: Secondary | ICD-10-CM | POA: Diagnosis not present

## 2015-08-28 DIAGNOSIS — M159 Polyosteoarthritis, unspecified: Secondary | ICD-10-CM | POA: Diagnosis not present

## 2015-08-28 DIAGNOSIS — D649 Anemia, unspecified: Secondary | ICD-10-CM | POA: Diagnosis not present

## 2015-08-28 DIAGNOSIS — M546 Pain in thoracic spine: Secondary | ICD-10-CM | POA: Diagnosis not present

## 2015-08-28 DIAGNOSIS — R609 Edema, unspecified: Secondary | ICD-10-CM | POA: Diagnosis not present

## 2015-08-28 DIAGNOSIS — E871 Hypo-osmolality and hyponatremia: Secondary | ICD-10-CM

## 2015-08-28 DIAGNOSIS — K5900 Constipation, unspecified: Secondary | ICD-10-CM

## 2015-08-28 NOTE — Assessment & Plan Note (Signed)
SNF for care. Continue Aricept and Namenda. MMSE 22/30 10/26/14  

## 2015-08-28 NOTE — Assessment & Plan Note (Signed)
Controlled, continue Amlodipine 5mg and Metoprolol 50mg bid. 

## 2015-08-28 NOTE — Assessment & Plan Note (Signed)
04/01/15 off Mirtazapine and started Cymbalta 30mg in setting of aches and pains.  --stable.  

## 2015-08-28 NOTE — Assessment & Plan Note (Signed)
better controlled, continue Norco  

## 2015-08-28 NOTE — Assessment & Plan Note (Signed)
Stable, continue Omeprazole.  

## 2015-08-28 NOTE — Assessment & Plan Note (Signed)
MiraLax 17gm + 4oz fluid q2h until BM

## 2015-08-28 NOTE — Assessment & Plan Note (Signed)
 urine removed, I+O cath q shift for now, UA C/S, CBC, CMP

## 2015-08-28 NOTE — Progress Notes (Signed)
Patient ID: Jamie [name], female   DOB: 03/16/1920, 81 y.o.   MRN: [id number]  Location:  SNF [facility] Provider:  Chipper Oman NP  Code Status:  DNR Goals of care: Advanced Directive information    Chief Complaint  Patient presents with  . Medical Management of Chronic Issues  . Acute Visit    abdominal distension and pain      HPI: Patient is a 81 y.o. female seen in the SNF at [facility] today for evaluation of abdominal distension and pain, BS low x 4 quadrants, tympanic sound on percussion except suprapubic area, I+O catheter place, 1100cc urine removed, abd softened, rectal checked with soft stool. VS stable. The patient is alert and stated she felt better after urine retention resolved.   Review of Systems:  Review of Systems  Constitutional: Positive for weight loss and malaise/fatigue. Negative for fever, chills and diaphoresis.  HENT: Positive for hearing loss. Negative for congestion, ear discharge, ear pain and tinnitus.   Eyes: Negative for photophobia, pain, discharge and redness.  Respiratory: Negative for cough, shortness of breath and wheezing.   Cardiovascular: Negative for chest pain, palpitations and leg swelling.  Gastrointestinal: Positive for abdominal pain and diarrhea. Negative for nausea, vomiting and blood in stool.  Genitourinary: Positive for frequency. Negative for dysuria, urgency and flank pain.       Urinary retention.   Musculoskeletal: Positive for back pain. Negative for myalgias and neck pain.  Skin: Negative for rash.       Left breast mass  Neurological: Positive for weakness. Negative for dizziness, tremors, seizures and headaches.  Psychiatric/Behavioral: Positive for memory loss. Negative for suicidal ideas and hallucinations. The patient is not nervous/anxious.     Past Medical History  Diagnosis Date  . Osteoporosis   . Allergic rhinitis   . Dementia   . Osteoarthritis     hands  . IBS (irritable bowel syndrome)   . GERD  (gastroesophageal reflux disease)   . Hypertension   . Palpitation     a. PAC's & PVC's by Holter - 2011  . Tortuous colon 2002  . Descending thoracic aortic dissection (HCC) 04/24/2010    a. MRA 2011 - begins distal to left subclavian and extends throughout the full length of the Ao.  Marland Kitchen Aortic insufficiency 04/16/2010    a. Echo 2011 - mild to moderate AI, EF 60%.  . Anemia, unspecified 01/12/2013  . Other abnormal blood chemistry 01/12/2013  . Lumbago 10/06/2012  . Personal history of fall 10/06/2012  . Hyposmolality and/or hyponatremia 09/29/2012  . Otitis externa 09/29/2012  . Impacted cerumen 09/29/2012  . Unspecified conductive hearing loss 09/29/2012  . External hemorrhoids without mention of complication 09/29/2012  . Unspecified venous (peripheral) insufficiency 09/29/2012  . Unspecified constipation 09/29/2012  . Abnormality of gait 09/29/2012  . Congestive heart failure, unspecified 06/30/2003  . Failure to thrive in adult 04/01/2015    Patient Active Problem List   Diagnosis Date Noted  . Hyponatremia 09/05/2015  . Acute urinary retention 08/28/2015  . Diarrhea 08/19/2015  . Loss of weight 10/01/2014  . Breast lump on left side at 7 o'clock position 07/02/2014  . Left hip pain 06/04/2014  . Depression with anxiety 12/06/2013  . Gait disorder 09/29/2013  . Fall 08/31/2013  . GERD (gastroesophageal reflux disease) 07/03/2013  . Urinary tract infection, site not specified 05/29/2013  . Unspecified constipation 05/29/2013  . DNR (do not resuscitate) 04/13/2013  . Anemia 03/08/2013  . Hypertension   .  Constipation 05/25/2011  . Thoracic back pain 05/25/2011  . Descending thoracic aortic dissection (HCC) 04/24/2010  . Aortic insufficiency 04/16/2010  . CARDIOMEGALY 03/12/2010  . PALPITATIONS, HX OF 02/03/2010  . Vitamin D deficiency 01/07/2010  . DISTURBANCE, VISUAL NOS 06/27/2007  . OSTEOARTHROSIS, GENERALIZED, MULTIPLE SITES 06/27/2007  . Dementia, vascular 03/07/2007  .  Allergic rhinitis 03/07/2007  . IRRITABLE BOWEL SYNDROME 03/07/2007  . OVERACTIVE BLADDER 03/07/2007  . OSTEOPOROSIS 03/07/2007    Allergies  Allergen Reactions  . Alendronate Sodium     REACTION: GI    Medications: Patient's Medications  New Prescriptions   DOCUSATE SODIUM (ENEMEEZ) 283 MG ENEMA    Place 1 enema (283 mg total) rectally daily.   SENNA-DOCUSATE (SENOKOT-S) 8.6-50 MG TABLET    Take 1 tablet by mouth daily.  Previous Medications   ACETAMINOPHEN (TYLENOL) 325 MG TABLET    Take 650 mg by mouth every 6 (six) hours as needed.    AMLODIPINE (NORVASC) 5 MG TABLET    Take 5 mg by mouth daily.   ARTIFICIAL TEAR SOLUTION (TEARS NATURALE II OP)    Apply 1 drop to eye daily. Both eyes twice daily   AZELASTINE (ASTELIN) 137 MCG/SPRAY NASAL SPRAY    Place 2 sprays into the nose. At bedtime   BISACODYL (DULCOLAX) 10 MG SUPPOSITORY    Place 10 mg rectally daily as needed for moderate constipation.   CHOLESTYRAMINE (QUESTRAN) 4 G PACKET    Take 4 g by mouth 3 (three) times daily with meals.   DONEPEZIL (ARICEPT) 10 MG TABLET    Take 1 tablet (10 mg total) by mouth daily.   DULOXETINE (CYMBALTA) 30 MG CAPSULE    Take 30 mg by mouth daily.   FAMOTIDINE (PEPCID) 20 MG TABLET    Take 20 mg by mouth at bedtime.   HYDROCODONE-ACETAMINOPHEN (NORCO/VICODIN) 5-325 MG PER TABLET    Take 1 tablet by mouth 4 (four) times daily.   MEMANTINE (NAMENDA) 10 MG TABLET    Take 10 mg by mouth 2 (two) times daily.    METOPROLOL (LOPRESSOR) 50 MG TABLET    Take 1 tablet (50 mg total) by mouth 2 (two) times daily.   METRONIDAZOLE (FLAGYL) 500 MG TABLET    Take 500 mg by mouth 3 (three) times daily.   MULTIPLE VITAMINS-MINERALS (CERTAVITE/ANTIOXIDANTS PO)    Take 1 tablet by mouth daily.   NUTRITIONAL SUPPLEMENTS (RESOURCE PO)    Take 120 mLs by mouth 3 (three) times daily. With med pass.   POLYETHYLENE GLYCOL (MIRALAX / GLYCOLAX) PACKET    Take 17 g by mouth daily as needed.   Modified Medications   No  medications on file  Discontinued Medications   No medications on file    Physical Exam: Filed Vitals:   08/28/15 1003  BP: 104/84  Pulse: 66  Temp: 97.6 F (36.4 C)  TempSrc: Tympanic  Resp: 16   There is no weight on file to calculate BMI.  Physical Exam  Constitutional: She appears well-developed and well-nourished. No distress.  HENT:  Head: Normocephalic and atraumatic.  Nose: Nose normal.  Mouth/Throat: Oropharynx is clear and moist. No oropharyngeal exudate.  Eyes: Conjunctivae and EOM are normal. Pupils are equal, round, and reactive to light. Right eye exhibits no discharge. Left eye exhibits no discharge. No scleral icterus.  Neck: Normal range of motion. Neck supple. No JVD present. No thyromegaly present.  Cardiovascular: Normal rate and regular rhythm.   Murmur heard. Systolic murmur appreciated at the left  sternal border 2/6  Pulmonary/Chest: Effort normal and breath sounds normal. No respiratory distress. She has no wheezes. She has no rales.  Left breast mass.  Abdominal: She exhibits distension. There is tenderness.  Lower BS x4 quadrants, tympanic percussion, distended bladder, urine removed via I+O catheter. Rectal checked, soft stool noted  Musculoskeletal: Normal range of motion. She exhibits tenderness. She exhibits no edema.  Pain in the right shoulder/shoulder blade, neck, middle/lower back-gait instability and frequent falling. C/o aches allover-better since Norco tid. Scoliokyphosis.   Lymphadenopathy:    She has no cervical adenopathy.  Neurological: She is alert. She has normal reflexes. No cranial nerve deficit. She exhibits normal muscle tone. Coordination normal.  Skin: Skin is warm and dry. No rash noted. She is not diaphoretic. No erythema.  Mobile, smooth, a large marble size, non-tender at left breast 7 o'clock  Psychiatric: Her mood appears not anxious. Her affect is inappropriate. Her affect is not angry, not blunt and not labile. Her  speech is delayed. Her speech is not rapid and/or pressured, not tangential and not slurred. She is slowed. She is not agitated, not aggressive, not hyperactive, not withdrawn and not actively hallucinating. Thought content is not paranoid and not delusional. Cognition and memory are impaired. She expresses impulsivity and inappropriate judgment. She does not exhibit a depressed mood. She is communicative. She exhibits abnormal recent memory. She exhibits normal remote memory. She is attentive.    Labs reviewed: Basic Metabolic Panel:  Recent Labs  14/78/29 08/29/15 0930  NA 141 132*  K 3.9 3.5  CL  --  97*  CO2  --  26  GLUCOSE  --  103*  BUN 29* 18  CREATININE 0.8 0.51  CALCIUM  --  9.2    Liver Function Tests:  Recent Labs  10/02/14 08/29/15 0930  AST 12* 24  ALT 8 18  ALKPHOS 40 43  BILITOT  --  0.7  PROT  --  6.9  ALBUMIN  --  3.6    CBC:  Recent Labs  10/02/14 08/29/15 0930  WBC 6.1 7.2  NEUTROABS  --  5.8  HGB 11.9* 11.8*  HCT 37 36.4  MCV  --  92.2  PLT 130* 153    Lab Results  Component Value Date   TSH 0.90 10/02/2014   Lab Results  Component Value Date   HGBA1C 5.9 01/17/2013   Lab Results  Component Value Date   CHOL 181 01/07/2010   HDL 93.60 01/07/2010   LDLCALC 78 01/07/2010   TRIG 49.0 01/07/2010   CHOLHDL 2 01/07/2010    Significant Diagnostic Results since last visit: none  Patient Care Team: Kimber Relic, MD as PCP - General (Internal Medicine) Laurey Morale, MD as Consulting Physician (Cardiology)  Assessment/Plan Problem List Items Addressed This Visit    Dementia, vascular (Chronic)    SNF for care. Continue Aricept and Namenda. MMSE 22/30 10/26/14      Hypertension (Chronic)    Controlled, continue Amlodipine  and Metoprolol  bid.      Depression with anxiety (Chronic)    04/01/15 off Mirtazapine and started Cymbalta  in setting of aches and pains.  --stable.       Constipation    MiraLax 17gm + 4oz  fluid q2h until BM      Thoracic back pain    better controlled, continue Norco       Anemia    08/29/15 Hgb 12.6      GERD (gastroesophageal  reflux disease)    Stable, continue Omeprazole.       Acute urinary retention - Primary    urine removed, I+O cath q shift for now, UA C/S, CBC, CMP      Hyponatremia    08/29/15 Na 131, repeat BMP          Family/ staff Communication: VS q4h, may ED eval if unstable.   Labs/tests ordered: CBC, CMP, UA C/S done 08/29/15. Repeat BMP  [facility] Lilyona Richner NP Geriatrics [facility] [address] [place], [place] [address] On Call:  [phone] & follow prompts after 5pm & weekends Office Phone:  [phone] Office Fax:  [phone]

## 2015-08-29 ENCOUNTER — Emergency Department (HOSPITAL_COMMUNITY)
Admission: EM | Admit: 2015-08-29 | Discharge: 2015-08-29 | Disposition: A | Discharge status: Home / Self Care | Payer: Medicare Other | Attending: Emergency Medicine | Admitting: Emergency Medicine

## 2015-08-29 ENCOUNTER — Encounter (HOSPITAL_COMMUNITY): Payer: Self-pay | Admitting: Family Medicine

## 2015-08-29 ENCOUNTER — Emergency Department (HOSPITAL_COMMUNITY): Payer: Medicare Other

## 2015-08-29 DIAGNOSIS — R140 Abdominal distension (gaseous): Secondary | ICD-10-CM | POA: Diagnosis not present

## 2015-08-29 DIAGNOSIS — I509 Heart failure, unspecified: Secondary | ICD-10-CM | POA: Insufficient documentation

## 2015-08-29 DIAGNOSIS — R1030 Lower abdominal pain, unspecified: Secondary | ICD-10-CM | POA: Diagnosis not present

## 2015-08-29 DIAGNOSIS — I7101 Dissection of thoracic aorta: Secondary | ICD-10-CM | POA: Diagnosis not present

## 2015-08-29 DIAGNOSIS — F0390 Unspecified dementia without behavioral disturbance: Secondary | ICD-10-CM | POA: Insufficient documentation

## 2015-08-29 DIAGNOSIS — K5900 Constipation, unspecified: Secondary | ICD-10-CM | POA: Diagnosis not present

## 2015-08-29 DIAGNOSIS — H9190 Unspecified hearing loss, unspecified ear: Secondary | ICD-10-CM | POA: Diagnosis not present

## 2015-08-29 DIAGNOSIS — Q438 Other specified congenital malformations of intestine: Secondary | ICD-10-CM | POA: Diagnosis not present

## 2015-08-29 DIAGNOSIS — Z9181 History of falling: Secondary | ICD-10-CM | POA: Insufficient documentation

## 2015-08-29 DIAGNOSIS — M19041 Primary osteoarthritis, right hand: Secondary | ICD-10-CM | POA: Diagnosis not present

## 2015-08-29 DIAGNOSIS — R339 Retention of urine, unspecified: Secondary | ICD-10-CM | POA: Diagnosis present

## 2015-08-29 DIAGNOSIS — Z862 Personal history of diseases of the blood and blood-forming organs and certain disorders involving the immune mechanism: Secondary | ICD-10-CM | POA: Insufficient documentation

## 2015-08-29 DIAGNOSIS — N390 Urinary tract infection, site not specified: Secondary | ICD-10-CM | POA: Diagnosis not present

## 2015-08-29 DIAGNOSIS — I10 Essential (primary) hypertension: Secondary | ICD-10-CM | POA: Insufficient documentation

## 2015-08-29 DIAGNOSIS — R1084 Generalized abdominal pain: Secondary | ICD-10-CM | POA: Diagnosis not present

## 2015-08-29 DIAGNOSIS — Z792 Long term (current) use of antibiotics: Secondary | ICD-10-CM | POA: Insufficient documentation

## 2015-08-29 DIAGNOSIS — Z79899 Other long term (current) drug therapy: Secondary | ICD-10-CM | POA: Insufficient documentation

## 2015-08-29 DIAGNOSIS — M19042 Primary osteoarthritis, left hand: Secondary | ICD-10-CM | POA: Insufficient documentation

## 2015-08-29 DIAGNOSIS — R109 Unspecified abdominal pain: Secondary | ICD-10-CM | POA: Diagnosis not present

## 2015-08-29 LAB — COMPREHENSIVE METABOLIC PANEL
ALK PHOS: 43 U/L (ref 38–126)
ALT: 18 U/L (ref 14–54)
AST: 24 U/L (ref 15–41)
Albumin: 3.6 g/dL (ref 3.5–5.0)
Anion gap: 9 (ref 5–15)
BUN: 18 mg/dL (ref 6–20)
CALCIUM: 9.2 mg/dL (ref 8.9–10.3)
CO2: 26 mmol/L (ref 22–32)
Chloride: 97 mmol/L — ABNORMAL LOW (ref 101–111)
Creatinine, Ser: 0.51 mg/dL (ref 0.44–1.00)
GFR calc non Af Amer: >60 mL/min (ref >60)
GLUCOSE: 103 mg/dL — AB (ref 65–99)
Potassium: 3.5 mmol/L (ref 3.5–5.1)
Sodium: 132 mmol/L — ABNORMAL LOW (ref 135–145)
Total Bilirubin: 0.7 mg/dL (ref 0.3–1.2)
Total Protein: 6.9 g/dL (ref 6.5–8.1)

## 2015-08-29 LAB — URINALYSIS, ROUTINE W REFLEX MICROSCOPIC
BILIRUBIN URINE: NEGATIVE
Glucose, UA: NEGATIVE mg/dL
Ketones, ur: NEGATIVE mg/dL
Nitrite: POSITIVE — AB
PH: 5.5 (ref 5.0–8.0)
Protein, ur: 100 mg/dL — AB
SPECIFIC GRAVITY, URINE: 1.027 (ref 1.005–1.030)
Urobilinogen, UA: 0.2 mg/dL (ref 0.0–1.0)

## 2015-08-29 LAB — CBC WITH DIFFERENTIAL/PLATELET
Basophils Absolute: 0 10*3/uL (ref 0.0–0.1)
Basophils Relative: 0 %
Eosinophils Absolute: 0.1 10*3/uL (ref 0.0–0.7)
Eosinophils Relative: 2 %
HEMATOCRIT: 36.4 % (ref 36.0–46.0)
HEMOGLOBIN: 11.8 g/dL — AB (ref 12.0–15.0)
LYMPHS ABS: 0.7 10*3/uL (ref 0.7–4.0)
LYMPHS PCT: 10 %
MCH: 29.9 pg (ref 26.0–34.0)
MCHC: 32.4 g/dL (ref 30.0–36.0)
MCV: 92.2 fL (ref 78.0–100.0)
MONO ABS: 0.5 10*3/uL (ref 0.1–1.0)
MONOS PCT: 7 %
NEUTROS ABS: 5.8 10*3/uL (ref 1.7–7.7)
Neutrophils Relative %: 81 %
Platelets: 153 10*3/uL (ref 150–400)
RBC: 3.95 MIL/uL (ref 3.87–5.11)
RDW: 13.6 % (ref 11.5–15.5)
WBC: 7.2 10*3/uL (ref 4.0–10.5)

## 2015-08-29 LAB — BASIC METABOLIC PANEL
BUN: 18 mg/dL (ref 4–21)
Creatinine: 0.5 mg/dL (ref 0.5–1.1)
Glucose: 84 mg/dL
Potassium: 3.7 mmol/L (ref 3.4–5.3)
SODIUM: 131 mmol/L — AB (ref 137–147)

## 2015-08-29 LAB — CBC AND DIFFERENTIAL
HEMATOCRIT: 36 % (ref 36–46)
HEMOGLOBIN: 12.6 g/dL (ref 12.0–16.0)
PLATELETS: 144 10*3/uL — AB (ref 150–399)
WBC: 8.2 10^3/mL

## 2015-08-29 LAB — HEPATIC FUNCTION PANEL
ALT: 14 U/L (ref 7–35)
AST: 18 U/L (ref 13–35)
Alkaline Phosphatase: 37 U/L (ref 25–125)
Bilirubin, Total: 0.5 mg/dL

## 2015-08-29 LAB — LIPASE, BLOOD: Lipase: 24 U/L (ref 22–51)

## 2015-08-29 LAB — URINE MICROSCOPIC-ADD ON

## 2015-08-29 MED ORDER — MILK AND MOLASSES ENEMA
1.0000 | Freq: Once | RECTAL | Status: AC
  Administered 2015-08-29: 250 mL via RECTAL
  Filled 2015-08-29: qty 250

## 2015-08-29 MED ORDER — FENTANYL CITRATE (PF) 100 MCG/2ML IJ SOLN
12.5000 ug | Freq: Once | INTRAMUSCULAR | Status: AC
  Administered 2015-08-29: 12.5 ug via INTRAVENOUS
  Filled 2015-08-29: qty 2

## 2015-08-29 MED ORDER — IOHEXOL 300 MG/ML  SOLN: 50.0000 mL | Freq: Once | INTRAMUSCULAR | Status: DC | PRN

## 2015-08-29 MED ORDER — FOSFOMYCIN TROMETHAMINE 3 G PO PACK
3.0000 g | PACK | Freq: Once | ORAL | Status: AC
  Administered 2015-08-29: 3 g via ORAL
  Filled 2015-08-29: qty 3

## 2015-08-29 MED ORDER — DOCUSATE SODIUM 283 MG RE ENEM: 1.0000 | ENEMA | Freq: Every day | RECTAL | Status: DC

## 2015-08-29 MED ORDER — FENTANYL CITRATE (PF) 100 MCG/2ML IJ SOLN
12.5000 ug | INTRAMUSCULAR | Status: DC | PRN
  Administered 2015-08-29: 12.5 ug via INTRAVENOUS
  Filled 2015-08-29: qty 2

## 2015-08-29 MED ORDER — SENNOSIDES-DOCUSATE SODIUM 8.6-50 MG PO TABS: 1.0000 | ORAL_TABLET | Freq: Every day | ORAL | Status: DC

## 2015-08-29 NOTE — ED Notes (Addendum)
Perineal care performed x6 over the course of the patient's stay in ED- performed before patient transported via [facility]. Report given to RN at [facility] on [address]- aware patient needs to be rounded on frequently d/t increased stool from enema administration. Given medication administration details. RN aware patient received antibiotic for UTI in ED. AVS also explained to son, Lesley Atkin. No other c/c.

## 2015-08-29 NOTE — Discharge Instructions (Signed)
As discussed, with the new finding of constipation, dysuria important that you use enemas, daily for the next 5 days, then continue using the oral stool softening regimen.  Return here for concerning changes in your condition.

## 2015-08-29 NOTE — ED Notes (Signed)
Patient is from [facility]. Staff reported to [facility] that patient had not urinated and had a bowel movement for the last 12-14 hours. At 5am,staff reported a small amount of stool was passed. EMS reports her abd is tender and distended.

## 2015-08-29 NOTE — ED Provider Notes (Signed)
CSN: [id number]     Arrival date & time 08/29/15  4098 History   First MD Initiated Contact with Patient 08/29/15 [phone]     Chief Complaint  Patient presents with  . Constipation  . Urinary Retention     (Consider location/radiation/quality/duration/timing/severity/associated sxs/prior Treatment) HPI Vision presents from nursing facility with her son who assists with the history of present illness. Patient's dementia, level V caveat. According to son, the patient has chronically had alternating constipation and diarrhea for a long time. Over the past few days she has had increasing abdominal distention, with no bowel movements, and started using typical OTC medication. No report of new vomiting, fever. Patient herself cannot provide any useful details of the history of present illness.  Past Medical History  Diagnosis Date  . Osteoporosis   . Allergic rhinitis   . Dementia   . Osteoarthritis     hands  . IBS (irritable bowel syndrome)   . GERD (gastroesophageal reflux disease)   . Hypertension   . Palpitation     a. PAC's & PVC's by Holter - 2011  . Tortuous colon 2002  . Descending thoracic aortic dissection (HCC) 04/24/2010    a. MRA 2011 - begins distal to left subclavian and extends throughout the full length of the Ao.  Marland Kitchen Aortic insufficiency 04/16/2010    a. Echo 2011 - mild to moderate AI, EF 60%.  . Anemia, unspecified 01/12/2013  . Other abnormal blood chemistry 01/12/2013  . Lumbago 10/06/2012  . Personal history of fall 10/06/2012  . Hyposmolality and/or hyponatremia 09/29/2012  . Otitis externa 09/29/2012  . Impacted cerumen 09/29/2012  . Unspecified conductive hearing loss 09/29/2012  . External hemorrhoids without mention of complication 09/29/2012  . Unspecified venous (peripheral) insufficiency 09/29/2012  . Unspecified constipation 09/29/2012  . Abnormality of gait 09/29/2012  . Congestive heart failure, unspecified 06/30/2003  . Failure to thrive in adult 04/01/2015    Past Surgical History  Procedure Laterality Date  . Dilation and curettage of uterus  1960  . Cataract extraction w/ intraocular lens implant     Family History  Problem Relation Age of Onset  . Diabetes Mother   . Coronary artery disease Father   . Hypertension Father   . Diabetes      sisters   Social History  Substance Use Topics  . Smoking status: Never Smoker   . Smokeless tobacco: Never Used  . Alcohol Use: No   OB History    No data available     Review of Systems  Unable to perform ROS: Dementia      Allergies  Alendronate sodium  Home Medications   Prior to Admission medications   Medication Sig Start Date End Date Taking? Authorizing Provider  acetaminophen (TYLENOL) 325 MG tablet Take 650 mg by mouth every 6 (six) hours as needed.    Yes Historical Provider, MD  amLODipine (NORVASC) 5 MG tablet Take 5 mg by mouth daily.   Yes Historical Provider, MD  Artificial Tear Solution (TEARS NATURALE II OP) Apply 1 drop to eye daily. Both eyes twice daily   Yes Historical Provider, MD  azelastine (ASTELIN) 137 MCG/SPRAY nasal spray Place 2 sprays into the nose. At bedtime 05/25/11  Yes Sheliah Hatch, MD  bisacodyl (DULCOLAX) 10 MG suppository Place 10 mg rectally daily as needed for moderate constipation.   Yes Historical Provider, MD  cholestyramine Lanetta Inch) 4 G packet Take 4 g by mouth 3 (three) times daily with meals.  Yes Historical Provider, MD  donepezil (ARICEPT) 10 MG tablet Take 1 tablet (10 mg total) by mouth daily. 05/19/12  Yes Sheliah Hatch, MD  DULoxetine (CYMBALTA) 30 MG capsule Take 30 mg by mouth daily.   Yes Historical Provider, MD  famotidine (PEPCID) 20 MG tablet Take 20 mg by mouth at bedtime.   Yes Historical Provider, MD  HYDROcodone-acetaminophen (NORCO/VICODIN) 5-325 MG per tablet Take 1 tablet by mouth 4 (four) times daily. 07/04/15  Yes Sharee Holster, NP  memantine (NAMENDA) 10 MG tablet Take 10 mg by mouth 2 (two) times daily.     Yes Historical Provider, MD  metoprolol (LOPRESSOR) 50 MG tablet Take 1 tablet (50 mg total) by mouth 2 (two) times daily. 03/30/12  Yes Ok Anis, NP  Multiple Vitamins-Minerals (CERTAVITE/ANTIOXIDANTS PO) Take 1 tablet by mouth daily.   Yes Historical Provider, MD  Nutritional Supplements (RESOURCE PO) Take 120 mLs by mouth 3 (three) times daily. With med pass.   Yes Historical Provider, MD  polyethylene glycol (MIRALAX / GLYCOLAX) packet Take 17 g by mouth daily as needed.    Yes Historical Provider, MD  metroNIDAZOLE (FLAGYL) 500 MG tablet Take 500 mg by mouth 3 (three) times daily. 08/16/15   Historical Provider, MD   BP 126/72 mmHg  Pulse 92  Temp(Src) 97.9 F (36.6 C) (Oral)  Resp 24  SpO2 93% Physical Exam  Constitutional: She appears well-developed and well-nourished. She appears listless. No distress.  HENT:  Head: Normocephalic and atraumatic.  Eyes: Conjunctivae and EOM are normal.  Cardiovascular: Normal rate and regular rhythm.   Pulmonary/Chest: Effort normal and breath sounds normal. No stridor. No respiratory distress.  Abdominal: She exhibits no distension.    Musculoskeletal: She exhibits no edema.  Neurological: She appears listless. She displays atrophy. She displays no tremor. No cranial nerve deficit. She displays no seizure activity.  Skin: Skin is warm and dry.  Psychiatric: She is slowed and withdrawn. Cognition and memory are impaired.  Nursing note and vitals reviewed.   ED Course  Procedures (including critical care time) Labs Review Labs Reviewed  COMPREHENSIVE METABOLIC PANEL - Abnormal; Notable for the following:    Sodium 132 (*)    Chloride 97 (*)    Glucose, Bld 103 (*)    All other components within normal limits  CBC WITH DIFFERENTIAL/PLATELET - Abnormal; Notable for the following:    Hemoglobin 11.8 (*)    All other components within normal limits  URINALYSIS, ROUTINE W REFLEX MICROSCOPIC (NOT AT [facility]) - Abnormal; Notable for  the following:    Color, Urine AMBER (*)    APPearance CLOUDY (*)    Hgb urine dipstick SMALL (*)    Protein, ur 100 (*)    Nitrite POSITIVE (*)    Leukocytes, UA SMALL (*)    All other components within normal limits  URINE MICROSCOPIC-ADD ON - Abnormal; Notable for the following:    Bacteria, UA MANY (*)    All other components within normal limits  LIPASE, BLOOD    Imaging Review Ct Abdomen Pelvis Wo Contrast  08/29/2015   CLINICAL DATA:  No urination or bowel movement for the last 12-14 hours. Abdomen tender in distended. Oral contrast administration only.  EXAM: CT ABDOMEN AND PELVIS WITHOUT CONTRAST  TECHNIQUE: Multidetector CT imaging of the abdomen and pelvis was performed following the standard protocol without IV contrast.  COMPARISON:  None.  FINDINGS: There is a massive stool ball within the rectal vault, distending the rectal  vault to approximately 13 x 13 cm. An additional large amount of stool is present within the sigmoid colon and lower descending colon. More proximal colon is moderately distended with gas and fluid. Small bowel remains normal in caliber.  Liver, spleen, pancreas, gallbladder, and adrenal glands are unremarkable for a noncontrast exam. Simple-appearing cyst within the left kidney measures 4.4 by 3.5 cm. Kidneys otherwise unremarkable without stone or hydronephrosis.  There is a dissection of the abdominal aorta and lower thoracic aorta, difficult to definitively characterize without intravascular contrast, but most likely as a significant portion of the dissection flap is calcified. No obvious periaortic hemorrhage or inflammation.  No free fluid or obvious abscess collection seen within the abdomen or pelvis. No free intraperitoneal air. Mild scarring/ fibrosis noted at each lung base. Degenerative changes are seen throughout the thoracolumbar spine but no acute osseous abnormality.  IMPRESSION: 1. Massive stool ball distending the rectal vault, with additional  large amount of stool throughout the sigmoid colon and descending colon. The more proximal colon is moderately distended with fluid and gas. Small bowel remains normal in caliber. 2. Bladder is displaced anteriorly by the distended rectal vault, but bladder is not significantly distended. 3. Dissection of the abdominal aorta and lower thoracic aorta. This is difficult to definitively characterize without IV contrast. No prior studies available for comparison. I suspect that the dissection is chronic as a significant portion of the dissection flap is calcified. 4. Other chronic/incidental findings detailed above.  These results were called by telephone at the time of interpretation on 08/29/2015 at 12:45 pm to Dr. Gerhard Munch , who verbally acknowledged these results.   Electronically Signed   By: Bary Richard M.D.   On: 08/29/2015 12:51   I have personally reviewed and evaluated these images and lab results as part of my medical decision-making.  I discussed the CT imaging with the radiologist.   On re-exam, the patient appears in similar condition.  She has had intermittent atrial fibrillation on the monitor.   3:52 PM Patient has received enema, disimpaction with RN assistance.  I had a lengthy conversation with the patient's son about all findings, the need for a bowel regimen at her nursing facility.   MDM  Elderly female with multiple medical problems, most prominently substantial dementia, presents with ongoing constipation, abdominal pain. Here the patient has a stool ball, but after initiation of enema, she does produce some loose stool. Given the passage of stool, the patient's baseline poor health condition, she was returned to her nursing facility, though acknowledging that it likely take several days of attention to her bowel movements to appropriately decompress her colon.   Gerhard Munch, MD 08/29/15 [phone]

## 2015-08-29 NOTE — ED Notes (Signed)
Bed: WA06 Expected date:  Expected time:  Means of arrival:  Comments: 81 y/o F constipation x 12 hrs

## 2015-09-05 ENCOUNTER — Encounter: Payer: Self-pay | Admitting: Nurse Practitioner

## 2015-09-05 DIAGNOSIS — E871 Hypo-osmolality and hyponatremia: Secondary | ICD-10-CM | POA: Insufficient documentation

## 2015-09-05 NOTE — Assessment & Plan Note (Signed)
08/29/15 Hgb 12.6

## 2015-09-05 NOTE — Assessment & Plan Note (Signed)
08/29/15 Na 131, repeat BMP

## 2015-09-06 DIAGNOSIS — I10 Essential (primary) hypertension: Secondary | ICD-10-CM | POA: Diagnosis not present

## 2015-09-06 LAB — BASIC METABOLIC PANEL
BUN: 19 mg/dL (ref 4–21)
CREATININE: 0.7 mg/dL (ref 0.5–1.1)
Glucose: 94 mg/dL
POTASSIUM: 4.6 mmol/L (ref 3.4–5.3)
SODIUM: 131 mmol/L — AB (ref 137–147)

## 2015-09-09 ENCOUNTER — Other Ambulatory Visit: Payer: Self-pay | Admitting: Nurse Practitioner

## 2015-09-09 ENCOUNTER — Encounter: Payer: Self-pay | Admitting: Nurse Practitioner

## 2015-09-09 ENCOUNTER — Non-Acute Institutional Stay (SKILLED_NURSING_FACILITY): Payer: Medicare Other | Admitting: Nurse Practitioner

## 2015-09-09 DIAGNOSIS — K219 Gastro-esophageal reflux disease without esophagitis: Secondary | ICD-10-CM

## 2015-09-09 DIAGNOSIS — F0150 Vascular dementia without behavioral disturbance: Secondary | ICD-10-CM

## 2015-09-09 DIAGNOSIS — R338 Other retention of urine: Secondary | ICD-10-CM

## 2015-09-09 DIAGNOSIS — R197 Diarrhea, unspecified: Secondary | ICD-10-CM | POA: Diagnosis not present

## 2015-09-09 DIAGNOSIS — M159 Polyosteoarthritis, unspecified: Secondary | ICD-10-CM

## 2015-09-09 DIAGNOSIS — E871 Hypo-osmolality and hyponatremia: Secondary | ICD-10-CM

## 2015-09-09 DIAGNOSIS — I10 Essential (primary) hypertension: Secondary | ICD-10-CM

## 2015-09-09 DIAGNOSIS — F418 Other specified anxiety disorders: Secondary | ICD-10-CM

## 2015-09-09 DIAGNOSIS — N390 Urinary tract infection, site not specified: Secondary | ICD-10-CM | POA: Diagnosis not present

## 2015-09-09 NOTE — Assessment & Plan Note (Signed)
resolved 

## 2015-09-09 NOTE — Assessment & Plan Note (Signed)
08/29/15 Na 131 09/06/15 Na 131 Repeat BMP

## 2015-09-09 NOTE — Assessment & Plan Note (Signed)
Continue Senokot S nightly. Stable.

## 2015-09-09 NOTE — Assessment & Plan Note (Signed)
Fully treated, repeat UA C/S

## 2015-09-09 NOTE — Assessment & Plan Note (Signed)
Stable

## 2015-09-09 NOTE — Assessment & Plan Note (Signed)
SNF for care. Continue Aricept and Namenda. MMSE 22/30 10/26/14, gradual declining 

## 2015-09-09 NOTE — Assessment & Plan Note (Signed)
Controlled, continue Amlodipine 5mg  and Metoprolol 50mg  bid.

## 2015-09-09 NOTE — Assessment & Plan Note (Signed)
better controlled, continue Norco

## 2015-09-09 NOTE — Progress Notes (Signed)
Patient ID: Jamie [name], female   DOB: 04/04/1920, 81 y.o.   MRN: [id number]  Location:  SNF [facility] Provider:  Chipper OmanManXie Mast NP  Code Status:  DNR Goals of care: Advanced Directive information    Chief Complaint  Patient presents with  . Medical Management of Chronic Issues  . Acute Visit    f/u ED eval 08/29/15     HPI: Patient is a 81 y.o. female [facility] today for evaluation of ED eval 08/29/15 for constipation and urinary retention, BM regularly while on Enema rectally x 5, Senokot S I daily, prn MiraLax, Dulcolax prn. Completed Septra DS bid x7 days for UTI, no further urinary retention.   Review of Systems:  Review of Systems  Constitutional: Positive for weight loss. Negative for fever, chills, malaise/fatigue and diaphoresis.  HENT: Positive for hearing loss. Negative for congestion, ear discharge, ear pain and tinnitus.   Eyes: Negative for photophobia, pain, discharge and redness.  Respiratory: Negative for cough, shortness of breath and wheezing.   Cardiovascular: Negative for chest pain, palpitations and leg swelling.  Gastrointestinal: Negative for nausea, vomiting, abdominal pain, diarrhea, constipation and blood in stool.  Genitourinary: Positive for frequency. Negative for dysuria, urgency and flank pain.  Musculoskeletal: Positive for back pain. Negative for myalgias and neck pain.  Skin: Negative for rash.       Left breast mass  Neurological: Positive for weakness. Negative for dizziness, tremors, seizures and headaches.  Psychiatric/Behavioral: Positive for memory loss. Negative for suicidal ideas and hallucinations. The patient is not nervous/anxious.     Past Medical History  Diagnosis Date  . Osteoporosis   . Allergic rhinitis   . Dementia   . Osteoarthritis     hands  . IBS (irritable bowel syndrome)   . GERD (gastroesophageal reflux disease)   . Hypertension   . Palpitation     a. PAC's & PVC's by Holter - 2011  .  Tortuous colon 2002  . Descending thoracic aortic dissection (HCC) 04/24/2010    a. MRA 2011 - begins distal to left subclavian and extends throughout the full length of the Ao.  Marland Kitchen. Aortic insufficiency 04/16/2010    a. Echo 2011 - mild to moderate AI, EF 60%.  . Anemia, unspecified 01/12/2013  . Other abnormal blood chemistry 01/12/2013  . Lumbago 10/06/2012  . Personal history of fall 10/06/2012  . Hyposmolality and/or hyponatremia 09/29/2012  . Otitis externa 09/29/2012  . Impacted cerumen 09/29/2012  . Unspecified conductive hearing loss 09/29/2012  . External hemorrhoids without mention of complication 09/29/2012  . Unspecified venous (peripheral) insufficiency 09/29/2012  . Unspecified constipation 09/29/2012  . Abnormality of gait 09/29/2012  . Congestive heart failure, unspecified 06/30/2003  . Failure to thrive in adult 04/01/2015    Patient Active Problem List   Diagnosis Date Noted  . Hyponatremia 09/05/2015  . Acute urinary retention 08/28/2015  . Diarrhea 08/19/2015  . Loss of weight 10/01/2014  . Breast lump on left side at 7 o'clock position 07/02/2014  . Left hip pain 06/04/2014  . Depression with anxiety 12/06/2013  . Gait disorder 09/29/2013  . Fall 08/31/2013  . GERD (gastroesophageal reflux disease) 07/03/2013  . Urinary tract infection, site not specified 05/29/2013  . Unspecified constipation 05/29/2013  . DNR (do not resuscitate) 04/13/2013  . Anemia 03/08/2013  . Hypertension   . Constipation 05/25/2011  . Thoracic back pain 05/25/2011  . Descending thoracic aortic dissection (HCC) 04/24/2010  . Aortic insufficiency  04/16/2010  . CARDIOMEGALY 03/12/2010  . PALPITATIONS, HX OF 02/03/2010  . Vitamin D deficiency 01/07/2010  . DISTURBANCE, VISUAL NOS 06/27/2007  . OSTEOARTHROSIS, GENERALIZED, MULTIPLE SITES 06/27/2007  . Dementia, vascular 03/07/2007  . Allergic rhinitis 03/07/2007  . IRRITABLE BOWEL SYNDROME 03/07/2007  . OVERACTIVE BLADDER 03/07/2007  .  OSTEOPOROSIS 03/07/2007    Allergies  Allergen Reactions  . Alendronate Sodium     REACTION: GI    Medications: Patient's Medications  New Prescriptions   No medications on file  Previous Medications   ACETAMINOPHEN (TYLENOL) 325 MG TABLET    Take 650 mg by mouth every 6 (six) hours as needed.    AMLODIPINE (NORVASC) 5 MG TABLET    Take 5 mg by mouth daily.   ARTIFICIAL TEAR SOLUTION (TEARS NATURALE II OP)    Apply 1 drop to eye daily. Both eyes twice daily   AZELASTINE (ASTELIN) 137 MCG/SPRAY NASAL SPRAY    Place 2 sprays into the nose. At bedtime   BISACODYL (DULCOLAX) 10 MG SUPPOSITORY    Place 10 mg rectally daily as needed for moderate constipation.   CHOLESTYRAMINE (QUESTRAN) 4 G PACKET    Take 4 g by mouth 3 (three) times daily with meals.   DOCUSATE SODIUM (ENEMEEZ) 283 MG ENEMA    Place 1 enema (283 mg total) rectally daily.   DONEPEZIL (ARICEPT) 10 MG TABLET    Take 1 tablet (10 mg total) by mouth daily.   DULOXETINE (CYMBALTA) 30 MG CAPSULE    Take 30 mg by mouth daily.   FAMOTIDINE (PEPCID) 20 MG TABLET    Take 20 mg by mouth at bedtime.   HYDROCODONE-ACETAMINOPHEN (NORCO/VICODIN) 5-325 MG PER TABLET    Take 1 tablet by mouth 4 (four) times daily.   MEMANTINE (NAMENDA) 10 MG TABLET    Take 10 mg by mouth 2 (two) times daily.    METOPROLOL (LOPRESSOR) 50 MG TABLET    Take 1 tablet (50 mg total) by mouth 2 (two) times daily.   METRONIDAZOLE (FLAGYL) 500 MG TABLET    Take 500 mg by mouth 3 (three) times daily.   MULTIPLE VITAMINS-MINERALS (CERTAVITE/ANTIOXIDANTS PO)    Take 1 tablet by mouth daily.   NUTRITIONAL SUPPLEMENTS (RESOURCE PO)    Take 120 mLs by mouth 3 (three) times daily. With med pass.   POLYETHYLENE GLYCOL (MIRALAX / GLYCOLAX) PACKET    Take 17 g by mouth daily as needed.    SENNA-DOCUSATE (SENOKOT-S) 8.6-50 MG TABLET    Take 1 tablet by mouth daily.  Modified Medications   No medications on file  Discontinued Medications   No medications on file     Physical Exam: Filed Vitals:   09/09/15 1010  BP: 140/80  Pulse: 76  Temp: 98.2 F (36.8 C)  TempSrc: Tympanic  Resp: 20   There is no weight on file to calculate BMI.  Physical Exam  Constitutional: She appears well-developed and well-nourished. No distress.  HENT:  Head: Normocephalic and atraumatic.  Nose: Nose normal.  Mouth/Throat: Oropharynx is clear and moist. No oropharyngeal exudate.  Eyes: Conjunctivae and EOM are normal. Pupils are equal, round, and reactive to light. Right eye exhibits no discharge. Left eye exhibits no discharge. No scleral icterus.  Neck: Normal range of motion. Neck supple. No JVD present. No thyromegaly present.  Cardiovascular: Normal rate and regular rhythm.   Murmur heard. Systolic murmur appreciated at the left sternal border 2/6  Pulmonary/Chest: Effort normal and breath sounds normal. No respiratory distress.  She has no wheezes. She has no rales.  Left breast mass.  Abdominal: She exhibits no distension. There is no tenderness.  BS x4 quadrants  Musculoskeletal: Normal range of motion. She exhibits tenderness. She exhibits no edema.  Pain in the right shoulder/shoulder blade, neck, middle/lower back-gait instability and frequent falling. C/o aches allover-better since Norco tid. Scoliokyphosis.   Lymphadenopathy:    She has no cervical adenopathy.  Neurological: She is alert. She has normal reflexes. No cranial nerve deficit. She exhibits normal muscle tone. Coordination normal.  Skin: Skin is warm and dry. No rash noted. She is not diaphoretic. No erythema.  Mobile, smooth, a large marble size, non-tender at left breast 7 o'clock  Psychiatric: Her mood appears not anxious. Her affect is inappropriate. Her affect is not angry, not blunt and not labile. Her speech is delayed. Her speech is not rapid and/or pressured, not tangential and not slurred. She is slowed. She is not agitated, not aggressive, not hyperactive, not withdrawn and not  actively hallucinating. Thought content is not paranoid and not delusional. Cognition and memory are impaired. She expresses impulsivity and inappropriate judgment. She does not exhibit a depressed mood. She is communicative. She exhibits abnormal recent memory. She exhibits normal remote memory. She is attentive.    Labs reviewed: Basic Metabolic Panel:  Recent Labs  13/08/65 08/29/15 0930 09/06/15  NA 141 132* 131*  K 3.9 3.5 4.6  CL  --  97*  --   CO2  --  26  --   GLUCOSE  --  103*  --   BUN 29* 18 19  CREATININE 0.8 0.51 0.7  CALCIUM  --  9.2  --     Liver Function Tests:  Recent Labs  10/02/14 08/29/15 0930  AST 12* 24  ALT 8 18  ALKPHOS 40 43  BILITOT  --  0.7  PROT  --  6.9  ALBUMIN  --  3.6    CBC:  Recent Labs  10/02/14 08/29/15 0930  WBC 6.1 7.2  NEUTROABS  --  5.8  HGB 11.9* 11.8*  HCT 37 36.4  MCV  --  92.2  PLT 130* 153    Lab Results  Component Value Date   TSH 0.90 10/02/2014   Lab Results  Component Value Date   HGBA1C 5.9 01/17/2013   Lab Results  Component Value Date   CHOL 181 01/07/2010   HDL 93.60 01/07/2010   LDLCALC 78 01/07/2010   TRIG 49.0 01/07/2010   CHOLHDL 2 01/07/2010    Significant Diagnostic Results since last visit: none  Patient Care Team: Kimber Relic, MD as PCP - General (Internal Medicine) Laurey Morale, MD as Consulting Physician (Cardiology)  Assessment/Plan Problem List Items Addressed This Visit    Dementia, vascular (Chronic)    SNF for care. Continue Aricept and Namenda. MMSE 22/30 10/26/14, gradual declining.       Hypertension (Chronic)    Controlled, continue Amlodipine  and Metoprolol  bid.      Depression with anxiety (Chronic)    04/01/15 off Mirtazapine and started Cymbalta  in setting of aches and pains.  --stable.       OSTEOARTHROSIS, GENERALIZED, MULTIPLE SITES    better controlled, continue Norco       Urinary tract infection, site not specified    Fully treated,  repeat UA C/S      GERD (gastroesophageal reflux disease)    Stable      Diarrhea    resolved  Acute urinary retention    resolved      Hyponatremia - Primary    08/29/15 Na 131 09/06/15 Na 131 Repeat BMP          Family/ staff Communication: observe for constipation  Labs/tests ordered: repeat BMP and UA C/S  [facility] Mast NP Geriatrics [facility] [address], [place] [address] On Call:  [phone] & follow prompts after 5pm & weekends Office Phone:  [phone] Office Fax:  [phone]

## 2015-09-09 NOTE — Assessment & Plan Note (Signed)
04/01/15 off Mirtazapine and started Cymbalta 30mg  in setting of aches and pains.  --stable.

## 2015-09-10 DIAGNOSIS — R609 Edema, unspecified: Secondary | ICD-10-CM | POA: Diagnosis not present

## 2015-09-10 DIAGNOSIS — R5381 Other malaise: Secondary | ICD-10-CM | POA: Diagnosis not present

## 2015-09-10 DIAGNOSIS — I10 Essential (primary) hypertension: Secondary | ICD-10-CM | POA: Diagnosis not present

## 2015-09-10 DIAGNOSIS — M159 Polyosteoarthritis, unspecified: Secondary | ICD-10-CM | POA: Diagnosis not present

## 2015-09-10 LAB — BASIC METABOLIC PANEL
BUN: 25 mg/dL — AB (ref 4–21)
Creatinine: 0.6 mg/dL (ref 0.5–1.1)
GLUCOSE: 104 mg/dL
Potassium: 4.3 mmol/L (ref 3.4–5.3)
SODIUM: 133 mmol/L — AB (ref 137–147)

## 2015-09-11 ENCOUNTER — Other Ambulatory Visit: Payer: Self-pay | Admitting: Nurse Practitioner

## 2015-09-11 DIAGNOSIS — E871 Hypo-osmolality and hyponatremia: Secondary | ICD-10-CM

## 2015-09-12 ENCOUNTER — Encounter: Payer: Self-pay | Admitting: Nurse Practitioner

## 2015-09-12 ENCOUNTER — Non-Acute Institutional Stay (SKILLED_NURSING_FACILITY): Payer: Medicare Other | Admitting: Nurse Practitioner

## 2015-09-12 DIAGNOSIS — K5900 Constipation, unspecified: Secondary | ICD-10-CM | POA: Diagnosis not present

## 2015-09-12 DIAGNOSIS — I10 Essential (primary) hypertension: Secondary | ICD-10-CM

## 2015-09-12 DIAGNOSIS — N390 Urinary tract infection, site not specified: Secondary | ICD-10-CM

## 2015-09-12 DIAGNOSIS — M159 Polyosteoarthritis, unspecified: Secondary | ICD-10-CM

## 2015-09-12 DIAGNOSIS — E871 Hypo-osmolality and hyponatremia: Secondary | ICD-10-CM

## 2015-09-12 DIAGNOSIS — F418 Other specified anxiety disorders: Secondary | ICD-10-CM | POA: Diagnosis not present

## 2015-09-12 DIAGNOSIS — F0150 Vascular dementia without behavioral disturbance: Secondary | ICD-10-CM

## 2015-09-12 DIAGNOSIS — K219 Gastro-esophageal reflux disease without esophagitis: Secondary | ICD-10-CM | POA: Diagnosis not present

## 2015-09-12 NOTE — Progress Notes (Signed)
Patient ID: Jamie [name], female   DOB: 05-Mar-1920, 81 y.o.   MRN: [id number]  Location:  SNF [facility] Provider:  Chipper Oman NP  Code Status:  DNR Goals of care: Advanced Directive information    Chief Complaint  Patient presents with  . Medical Management of Chronic Issues  . Acute Visit    UTI     HPI: Patient is a 81 y.o. female seen in the SNF at [facility] today for evaluation of UTI, 09/12/15 E culture E. Coli Nitrofurantoin  bid x 10 day  Review of Systems  Constitutional: Positive for weight loss. Negative for fever, chills, malaise/fatigue and diaphoresis.  HENT: Positive for hearing loss. Negative for congestion, ear discharge, ear pain and tinnitus.   Eyes: Negative for photophobia, pain, discharge and redness.  Respiratory: Negative for cough, shortness of breath and wheezing.   Cardiovascular: Negative for chest pain, palpitations and leg swelling.  Gastrointestinal: Negative for nausea, vomiting, abdominal pain and constipation.  Genitourinary: Positive for frequency. Negative for dysuria, urgency and flank pain.  Musculoskeletal: Positive for back pain. Negative for myalgias and neck pain.  Skin: Negative for rash.       Left breast mass  Neurological: Negative for dizziness, tremors, seizures, weakness and headaches.  Psychiatric/Behavioral: Positive for memory loss. Negative for suicidal ideas and hallucinations. The patient is not nervous/anxious.     Past Medical History  Diagnosis Date  . Osteoporosis   . Allergic rhinitis   . Dementia   . Osteoarthritis     hands  . IBS (irritable bowel syndrome)   . GERD (gastroesophageal reflux disease)   . Hypertension   . Palpitation     a. PAC's & PVC's by Holter - 2011  . Tortuous colon 2002  . Descending thoracic aortic dissection (HCC) 04/24/2010    a. MRA 2011 - begins distal to left subclavian and extends throughout the full length of the Ao.  Marland Kitchen Aortic insufficiency 04/16/2010    a. Echo 2011 -  mild to moderate AI, EF 60%.  . Anemia, unspecified 01/12/2013  . Other abnormal blood chemistry 01/12/2013  . Lumbago 10/06/2012  . Personal history of fall 10/06/2012  . Hyposmolality and/or hyponatremia 09/29/2012  . Otitis externa 09/29/2012  . Impacted cerumen 09/29/2012  . Unspecified conductive hearing loss 09/29/2012  . External hemorrhoids without mention of complication 09/29/2012  . Unspecified venous (peripheral) insufficiency 09/29/2012  . Unspecified constipation 09/29/2012  . Abnormality of gait 09/29/2012  . Congestive heart failure, unspecified 06/30/2003  . Failure to thrive in adult 04/01/2015    Patient Active Problem List   Diagnosis Date Noted  . Hyponatremia 09/05/2015  . Acute urinary retention 08/28/2015  . Diarrhea 08/19/2015  . Loss of weight 10/01/2014  . Breast lump on left side at 7 o'clock position 07/02/2014  . Left hip pain 06/04/2014  . Depression with anxiety 12/06/2013  . Gait disorder 09/29/2013  . Fall 08/31/2013  . GERD (gastroesophageal reflux disease) 07/03/2013  . Urinary tract infection, site not specified 05/29/2013  . Unspecified constipation 05/29/2013  . DNR (do not resuscitate) 04/13/2013  . Anemia 03/08/2013  . Hypertension   . Constipation 05/25/2011  . Thoracic back pain 05/25/2011  . Descending thoracic aortic dissection (HCC) 04/24/2010  . Aortic insufficiency 04/16/2010  . CARDIOMEGALY 03/12/2010  . PALPITATIONS, HX OF 02/03/2010  . Vitamin D deficiency 01/07/2010  . DISTURBANCE, VISUAL NOS 06/27/2007  . OSTEOARTHROSIS, GENERALIZED, MULTIPLE SITES 06/27/2007  . Dementia, vascular 03/07/2007  . Allergic  rhinitis 03/07/2007  . IRRITABLE BOWEL SYNDROME 03/07/2007  . OVERACTIVE BLADDER 03/07/2007  . OSTEOPOROSIS 03/07/2007    Allergies  Allergen Reactions  . Alendronate Sodium     REACTION: GI    Medications: Patient's Medications  New Prescriptions   No medications on file  Previous Medications   ACETAMINOPHEN  (TYLENOL) 325 MG TABLET    Take 650 mg by mouth every 6 (six) hours as needed.    AMLODIPINE (NORVASC) 5 MG TABLET    Take 5 mg by mouth daily.   ARTIFICIAL TEAR SOLUTION (TEARS NATURALE II OP)    Apply 1 drop to eye daily. Both eyes twice daily   AZELASTINE (ASTELIN) 137 MCG/SPRAY NASAL SPRAY    Place 2 sprays into the nose. At bedtime   BISACODYL (DULCOLAX) 10 MG SUPPOSITORY    Place 10 mg rectally daily as needed for moderate constipation.   CHOLESTYRAMINE (QUESTRAN) 4 G PACKET    Take 4 g by mouth 3 (three) times daily with meals.   DOCUSATE SODIUM (ENEMEEZ) 283 MG ENEMA    Place 1 enema (283 mg total) rectally daily.   DONEPEZIL (ARICEPT) 10 MG TABLET    Take 1 tablet (10 mg total) by mouth daily.   DULOXETINE (CYMBALTA) 30 MG CAPSULE    Take 30 mg by mouth daily.   FAMOTIDINE (PEPCID) 20 MG TABLET    Take 20 mg by mouth at bedtime.   HYDROCODONE-ACETAMINOPHEN (NORCO/VICODIN) 5-325 MG PER TABLET    Take 1 tablet by mouth 4 (four) times daily.   MEMANTINE (NAMENDA) 10 MG TABLET    Take 10 mg by mouth 2 (two) times daily.    METOPROLOL (LOPRESSOR) 50 MG TABLET    Take 1 tablet (50 mg total) by mouth 2 (two) times daily.   METRONIDAZOLE (FLAGYL) 500 MG TABLET    Take 500 mg by mouth 3 (three) times daily.   MULTIPLE VITAMINS-MINERALS (CERTAVITE/ANTIOXIDANTS PO)    Take 1 tablet by mouth daily.   NUTRITIONAL SUPPLEMENTS (RESOURCE PO)    Take 120 mLs by mouth 3 (three) times daily. With med pass.   POLYETHYLENE GLYCOL (MIRALAX / GLYCOLAX) PACKET    Take 17 g by mouth daily as needed.    SENNA-DOCUSATE (SENOKOT-S) 8.6-50 MG TABLET    Take 1 tablet by mouth daily.  Modified Medications   No medications on file  Discontinued Medications   No medications on file    Physical Exam: Filed Vitals:   09/12/15 1202  BP: 112/62  Pulse: 70  Temp: 98.1 F (36.7 C)  TempSrc: Tympanic  Resp: 20   There is no weight on file to calculate BMI.  Physical Exam  Constitutional: She appears  well-developed and well-nourished. No distress.  HENT:  Head: Normocephalic and atraumatic.  Nose: Nose normal.  Mouth/Throat: Oropharynx is clear and moist. No oropharyngeal exudate.  Eyes: Conjunctivae and EOM are normal. Pupils are equal, round, and reactive to light. Right eye exhibits no discharge. Left eye exhibits no discharge. No scleral icterus.  Neck: Normal range of motion. Neck supple. No JVD present. No thyromegaly present.  Cardiovascular: Normal rate and regular rhythm.   Murmur heard. Systolic murmur appreciated at the left sternal border 2/6  Pulmonary/Chest: Effort normal and breath sounds normal. No respiratory distress. She has no wheezes. She has no rales.  Left breast mass.  Abdominal: She exhibits no distension. There is no tenderness.  BS x4 quadrants  Musculoskeletal: Normal range of motion. She exhibits tenderness. She exhibits no  edema.  Pain in the right shoulder/shoulder blade, neck, middle/lower back-gait instability and frequent falling. C/o aches allover-better since Norco tid. Scoliokyphosis.   Lymphadenopathy:    She has no cervical adenopathy.  Neurological: She is alert. She has normal reflexes. No cranial nerve deficit. She exhibits normal muscle tone. Coordination normal.  Skin: Skin is warm and dry. No rash noted. She is not diaphoretic. No erythema.  Mobile, smooth, a large marble size, non-tender at left breast 7 o'clock  Psychiatric: Her mood appears not anxious. Her affect is inappropriate. Her affect is not angry, not blunt and not labile. Her speech is delayed. Her speech is not rapid and/or pressured, not tangential and not slurred. She is slowed. She is not agitated, not aggressive, not hyperactive, not withdrawn and not actively hallucinating. Thought content is not paranoid and not delusional. Cognition and memory are impaired. She expresses impulsivity and inappropriate judgment. She does not exhibit a depressed mood. She is communicative. She  exhibits abnormal recent memory. She exhibits normal remote memory. She is attentive.    Labs reviewed: Basic Metabolic Panel:  Recent Labs  16/10/96 0930 09/06/15 09/10/15  NA 132* 131* 133*  K 3.5 4.6 4.3  CL 97*  --   --   CO2 26  --   --   GLUCOSE 103*  --   --   BUN 18 19 25*  CREATININE 0.51 0.7 0.6  CALCIUM 9.2  --   --     Liver Function Tests:  Recent Labs  10/02/14 08/29/15 08/29/15 0930  AST 12* 18 24  ALT ALKPHOS 40 37 43  BILITOT  --   --  0.7  PROT  --   --  6.9  ALBUMIN  --   --  3.6    CBC:  Recent Labs  10/02/14 08/29/15 08/29/15 0930  WBC 6.1 8.2 7.2  NEUTROABS  --   --  5.8  HGB 11.9* 12.6 11.8*  HCT 37 36 36.4  MCV  --   --  92.2  PLT 130* 144* 153    Lab Results  Component Value Date   TSH 0.90 10/02/2014   Lab Results  Component Value Date   HGBA1C 5.9 01/17/2013   Lab Results  Component Value Date   CHOL 181 01/07/2010   HDL 93.60 01/07/2010   LDLCALC 78 01/07/2010   TRIG 49.0 01/07/2010   CHOLHDL 2 01/07/2010    Significant Diagnostic Results since last visit: none  Patient Care Team: Kimber Relic, MD as PCP - General (Internal Medicine) Laurey Morale, MD as Consulting Physician (Cardiology)  Assessment/Plan Problem List Items Addressed This Visit    Constipation    Resolved, continue the same tx      Dementia, vascular (Chronic)    SNF for care. Continue Aricept and Namenda. MMSE 22/30 10/26/14, gradual declining      Depression with anxiety (Chronic)    Mood has been stabilized since off Mirtazapine and started Cymbalta  in setting of aches and pains 03/2015       GERD (gastroesophageal reflux disease)    Asymptomatic.       Hypertension (Chronic)    Controlled, continue Amlodipine  and Metoprolol  bid.      Hyponatremia    Last serum Na 133      OSTEOARTHROSIS, GENERALIZED, MULTIPLE SITES    better controlled, chronic mid back pain in thoracic spine area,  continue Norco  Urinary tract infection, site not specified - Primary    05/29/14 urine culture E. Coli and S. Coag neg 50,000c/ml, treated with total 7 day course of Septra DS bid.  08/08/15 urine culture E. Coli, 7 day course of Septra DS bid.  09/12/15 E culture E. Coli Nitrofurantoin 100mg  bid x 10 day          Family/ staff Communication: observe for constipation  Labs/tests ordered: none  ManXie Mast NP Geriatrics [facility] [address] [place] [place], [place] [address] On Call:  [phone] & follow prompts after 5pm & weekends Office Phone:  [phone] Office Fax:  [phone]

## 2015-09-12 NOTE — Assessment & Plan Note (Signed)
05/29/14 urine culture E. Coli and S. Coag neg 50,000c/ml, treated with total 7 day course of Septra DS bid.  08/08/15 urine culture E. Coli, 7 day course of Septra DS bid.  09/12/15 E culture E. Coli Nitrofurantoin 100mg  bid x 10 day

## 2015-09-16 ENCOUNTER — Non-Acute Institutional Stay (SKILLED_NURSING_FACILITY): Payer: Medicare Other | Admitting: Internal Medicine

## 2015-09-16 ENCOUNTER — Encounter: Payer: Self-pay | Admitting: Nurse Practitioner

## 2015-09-16 ENCOUNTER — Encounter: Payer: Self-pay | Admitting: Internal Medicine

## 2015-09-16 DIAGNOSIS — R634 Abnormal weight loss: Secondary | ICD-10-CM | POA: Diagnosis not present

## 2015-09-16 DIAGNOSIS — K5900 Constipation, unspecified: Secondary | ICD-10-CM | POA: Diagnosis not present

## 2015-09-16 DIAGNOSIS — F0150 Vascular dementia without behavioral disturbance: Secondary | ICD-10-CM

## 2015-09-16 DIAGNOSIS — M159 Polyosteoarthritis, unspecified: Secondary | ICD-10-CM | POA: Diagnosis not present

## 2015-09-16 NOTE — Progress Notes (Signed)
Patient ID: Jamie [name], female   DOB: [birth year], 81 y.o.   MRN: [id number]    Facility      Place of Service:       Allergies  Allergen Reactions  . Alendronate Sodium     REACTION: GI    Chief Complaint  Patient presents with  . Acute Visit    follow up abd pain and diarrhea    HPI:  Patient was having diarrhea a few weeks ago. This turns out, she may have had a fecal impaction. She was seen in the emergency room 08/29/2015. See x-ray report below. Patient has had laxatives since and abdominal pain has improved.  Although a bit grumpy today, she responds to all questions and says she is doing okay.  Dementia, vascular, without behavioral disturbance  Loss of weight - stable for the last few months  OSTEOARTHROSIS, GENERALIZED, MULTIPLE SITES - She has been using hydrocodone 4 times daily, but she can do with less.    Medications: Patient's Medications  New Prescriptions   No medications on file  Previous Medications   ACETAMINOPHEN (TYLENOL) 325 MG TABLET    Take 650 mg by mouth every 6 (six) hours as needed.    AMLODIPINE (NORVASC) 5 MG TABLET    Take 5 mg by mouth daily.   ARTIFICIAL TEAR SOLUTION (TEARS NATURALE II OP)    Apply 1 drop to eye daily. Both eyes twice daily   AZELASTINE (ASTELIN) 137 MCG/SPRAY NASAL SPRAY    Place 2 sprays into the nose. At bedtime   BISACODYL (DULCOLAX) 10 MG SUPPOSITORY    Place 10 mg rectally daily as needed for moderate constipation.   CHOLESTYRAMINE (QUESTRAN) 4 G PACKET    Take 4 g by mouth 3 (three) times daily with meals.   DOCUSATE SODIUM (ENEMEEZ) 283 MG ENEMA    Place 1 enema (283 mg total) rectally daily.   DONEPEZIL (ARICEPT) 10 MG TABLET    Take 1 tablet (10 mg total) by mouth daily.   DULOXETINE (CYMBALTA) 30 MG CAPSULE    Take 30 mg by mouth daily.   FAMOTIDINE (PEPCID) 20 MG TABLET    Take 20 mg by mouth at bedtime.   HYDROCODONE-ACETAMINOPHEN (NORCO/VICODIN) 5-325 MG PER TABLET    Take 1 tablet by mouth 4 (four)  times daily.   MEMANTINE (NAMENDA) 10 MG TABLET    Take 10 mg by mouth 2 (two) times daily.    METOPROLOL (LOPRESSOR) 50 MG TABLET    Take 1 tablet (50 mg total) by mouth 2 (two) times daily.   METRONIDAZOLE (FLAGYL) 500 MG TABLET    Take 500 mg by mouth 3 (three) times daily.   MULTIPLE VITAMINS-MINERALS (CERTAVITE/ANTIOXIDANTS PO)    Take 1 tablet by mouth daily.   NUTRITIONAL SUPPLEMENTS (RESOURCE PO)    Take 120 mLs by mouth 3 (three) times daily. With med pass.   POLYETHYLENE GLYCOL (MIRALAX / GLYCOLAX) PACKET    Take 17 g by mouth daily as needed.    SENNA-DOCUSATE (SENOKOT-S) 8.6-50 MG TABLET    Take 1 tablet by mouth daily.  Modified Medications   No medications on file  Discontinued Medications   No medications on file     Review of Systems  Constitutional: Negative for fever, chills and diaphoresis.  HENT: Positive for hearing loss. Negative for congestion, ear discharge, ear pain and tinnitus.   Eyes: Negative for photophobia, pain, discharge and redness.  Respiratory: Negative for cough, shortness of breath and wheezing.  Cardiovascular: Negative for chest pain, palpitations and leg swelling.  Gastrointestinal: Positive for constipation. Negative for nausea, vomiting, abdominal pain and diarrhea.  Genitourinary: Positive for frequency. Negative for dysuria, urgency and flank pain.  Musculoskeletal: Positive for back pain and gait problem. Negative for myalgias and neck pain.  Skin: Negative for rash.       Left breast mass  Neurological: Negative for dizziness, tremors, seizures, weakness and headaches.  Psychiatric/Behavioral: Positive for confusion. Negative for suicidal ideas and hallucinations. The patient is not nervous/anxious.     Filed Vitals:   09/16/15 1534  BP: 120/80  Pulse: 82  Temp: 98.6 F (37 C)  Resp: 20  Height: _0  (1.651 m)  Weight: 104 lb (47.174 kg)   Body mass index is 17.31 kg/(m^2).  Physical Exam  Constitutional: She appears  well-developed and well-nourished. No distress.  HENT:  Head: Normocephalic and atraumatic.  Nose: Nose normal.  Mouth/Throat: Oropharynx is clear and moist. No oropharyngeal exudate.  Eyes: Conjunctivae and EOM are normal. Pupils are equal, round, and reactive to light. Right eye exhibits no discharge. Left eye exhibits no discharge. No scleral icterus.  Neck: Normal range of motion. Neck supple. No JVD present. No thyromegaly present.  Cardiovascular: Normal rate and regular rhythm.   Murmur heard. Systolic murmur appreciated at the left sternal border 2/6  Pulmonary/Chest: Effort normal and breath sounds normal. No respiratory distress. She has no wheezes. She has no rales.  Left breast mass.  Abdominal: She exhibits no distension. There is no tenderness.  BS x4 quadrants  Musculoskeletal: Normal range of motion. She exhibits tenderness. She exhibits no edema.  Pain in the right shoulder/shoulder blade, neck, middle/lower back-gait instability and frequent falling. C/o aches allover-better since Norco tid. Scoliokyphosis.   Lymphadenopathy:    She has no cervical adenopathy.  Neurological: She is alert. She has normal reflexes. No cranial nerve deficit. She exhibits normal muscle tone. Coordination normal.  Skin: Skin is warm and dry. No rash noted. She is not diaphoretic. No erythema.  Mobile, smooth, a large marble size, non-tender at left breast 7 o'clock  Psychiatric: Her mood appears not anxious. Her affect is inappropriate. Her affect is not angry, not blunt and not labile. Her speech is delayed. Her speech is not rapid and/or pressured, not tangential and not slurred. She is slowed. She is not agitated, not aggressive, not hyperactive, not withdrawn and not actively hallucinating. Thought content is not paranoid and not delusional. Cognition and memory are impaired. She expresses impulsivity and inappropriate judgment. She does not exhibit a depressed mood. She is communicative. She  exhibits abnormal recent memory. She exhibits normal remote memory. She is attentive.     Labs reviewed: Lab Summary Latest Ref Rng 09/10/2015 09/06/2015 08/29/2015 08/29/2015  Hemoglobin 12.0 - 15.0 g/dL (None) (None) 11.8(L) 12.6  Hematocrit 36.0 - 46.0 % (None) (None) 36.4 36  White count 4.0 - 10.5 K/uL (None) (None) 7.2 8.2  Platelet count 150 - 400 K/uL (None) (None) 153 144(A)  Sodium 137 - 147 mmol/L 133(A) 131(A) 132(L) 131(A)  Potassium 3.4 - 5.3 mmol/L 4.3 4.6 3.5 3.7  Calcium 8.9 - 10.3 mg/dL (None) (None) 9.2 (None)  Phosphorus - (None) (None) (None) (None)  Creatinine 0.5 - 1.1 mg/dL 0.6 0.7 0.51 0.5  AST 15 - 41 U/L (None) (None) 24 18  Alk Phos 38 - 126 U/L (None) (None) 43 37  Bilirubin 0.3 - 1.2 mg/dL (None) (None) 0.7 (None)  Glucose - 104 94 103(H) 84  Cholesterol - (None) (None) (None) (None)  HDL cholesterol - (None) (None) (None) (None)  Triglycerides - (None) (None) (None) (None)  LDL Direct - (None) (None) (None) (None)  LDL Calc - (None) (None) (None) (None)  Total protein 6.5 - 8.1 g/dL (None) (None) 6.9 (None)  Albumin 3.5 - 5.0 g/dL (None) (None) 3.6 (None)   Lab Results  Component Value Date   TSH 0.90 10/02/2014   Lab Results  Component Value Date   BUN 25* 09/10/2015   Lab Results  Component Value Date   HGBA1C 5.9 01/17/2013    Ct Abdomen Pelvis Wo Contrast  08/29/2015  CLINICAL DATA:  No urination or bowel movement for the last 12-14 hours. Abdomen tender in distended. Oral contrast administration only. EXAM: CT ABDOMEN AND PELVIS WITHOUT CONTRAST TECHNIQUE: Multidetector CT imaging of the abdomen and pelvis was performed following the standard protocol without IV contrast. COMPARISON:  None. FINDINGS: There is a massive stool ball within the rectal vault, distending the rectal vault to approximately 13 x 13 cm. An additional large amount of stool is present within the sigmoid colon and lower descending colon. More proximal colon is moderately  distended with gas and fluid. Small bowel remains normal in caliber. Liver, spleen, pancreas, gallbladder, and adrenal glands are unremarkable for a noncontrast exam. Simple-appearing cyst within the left kidney measures 4.4 by 3.5 cm. Kidneys otherwise unremarkable without stone or hydronephrosis. There is a dissection of the abdominal aorta and lower thoracic aorta, difficult to definitively characterize without intravascular contrast, but most likely as a significant portion of the dissection flap is calcified. No obvious periaortic hemorrhage or inflammation. No free fluid or obvious abscess collection seen within the abdomen or pelvis. No free intraperitoneal air. Mild scarring/ fibrosis noted at each lung base. Degenerative changes are seen throughout the thoracolumbar spine but no acute osseous abnormality. IMPRESSION: 1. Massive stool ball distending the rectal vault, with additional large amount of stool throughout the sigmoid colon and descending colon. The more proximal colon is moderately distended with fluid and gas. Small bowel remains normal in caliber. 2. Bladder is displaced anteriorly by the distended rectal vault, but bladder is not significantly distended. 3. Dissection of the abdominal aorta and lower thoracic aorta. This is difficult to definitively characterize without IV contrast. No prior studies available for comparison. I suspect that the dissection is chronic as a significant portion of the dissection flap is calcified. 4. Other chronic/incidental findings detailed above. These results were called by telephone at the time of interpretation on 08/29/2015 at 12:45 pm to Dr. Carmin Muskrat , who verbally acknowledged these results. Electronically Signed   By: Franki Cabot M.D.   On: 08/29/2015 12:51     Assessment/Plan  1. Constipation, unspecified constipation type Discontinue cholestyramine Reduce hydrocodone/APAP twice a day  2. Dementia, vascular, without behavioral  disturbance Continue current medication. Memantine XR 28 mg was changed to memantine 10 mg twice a day due to cost issues.  3. Loss of weight Stable  4. OSTEOARTHROSIS, GENERALIZED, MULTIPLE SITES Reduce hydrocodone/APAP to twice daily

## 2015-09-16 NOTE — Assessment & Plan Note (Signed)
Resolved, continue the same tx

## 2015-09-16 NOTE — Assessment & Plan Note (Signed)
SNF for care. Continue Aricept and Namenda. MMSE 22/30 10/26/14, gradual declining

## 2015-09-16 NOTE — Assessment & Plan Note (Signed)
Controlled, continue Amlodipine 5mg and Metoprolol 50mg bid. 

## 2015-09-16 NOTE — Assessment & Plan Note (Signed)
Last serum Na 133

## 2015-09-16 NOTE — Assessment & Plan Note (Signed)
Mood has been stabilized since off Mirtazapine and started Cymbalta 30mg  in setting of aches and pains 03/2015

## 2015-09-16 NOTE — Assessment & Plan Note (Signed)
Asymptomatic. 

## 2015-09-16 NOTE — Assessment & Plan Note (Signed)
better controlled, chronic mid back pain in thoracic spine area,  continue Norco

## 2015-10-14 ENCOUNTER — Encounter: Payer: Self-pay | Admitting: Nurse Practitioner

## 2015-10-14 ENCOUNTER — Non-Acute Institutional Stay (SKILLED_NURSING_FACILITY): Payer: Medicare Other | Admitting: Nurse Practitioner

## 2015-10-14 DIAGNOSIS — E871 Hypo-osmolality and hyponatremia: Secondary | ICD-10-CM

## 2015-10-14 DIAGNOSIS — I10 Essential (primary) hypertension: Secondary | ICD-10-CM

## 2015-10-14 DIAGNOSIS — M159 Polyosteoarthritis, unspecified: Secondary | ICD-10-CM

## 2015-10-14 DIAGNOSIS — F0150 Vascular dementia without behavioral disturbance: Secondary | ICD-10-CM

## 2015-10-14 DIAGNOSIS — K219 Gastro-esophageal reflux disease without esophagitis: Secondary | ICD-10-CM

## 2015-10-14 DIAGNOSIS — K5900 Constipation, unspecified: Secondary | ICD-10-CM

## 2015-10-14 DIAGNOSIS — F418 Other specified anxiety disorders: Secondary | ICD-10-CM

## 2015-10-14 NOTE — Assessment & Plan Note (Signed)
Last serum Na 133 

## 2015-10-14 NOTE — Progress Notes (Signed)
Patient ID: Jamie [name], female   DOB: 1920-04-17, 81 y.o.   MRN: [id number]  Location:  SNF [facility] Provider:  Chipper Oman NP  Code Status:  DNR Goals of care: Advanced Directive information    Chief Complaint  Patient presents with  . Medical Management of Chronic Issues     HPI: Patient is a 81 y.o. female seen in the SNF at [facility] today for evaluation of back pain, constipation, dementia  Review of Systems  Constitutional: Positive for weight loss. Negative for fever, chills, malaise/fatigue and diaphoresis.  HENT: Positive for hearing loss. Negative for congestion, ear discharge, ear pain and tinnitus.   Eyes: Negative for photophobia, pain, discharge and redness.  Respiratory: Negative for cough, shortness of breath and wheezing.   Cardiovascular: Negative for chest pain, palpitations and leg swelling.  Gastrointestinal: Negative for nausea, vomiting, abdominal pain and constipation.  Genitourinary: Positive for frequency. Negative for dysuria, urgency and flank pain.  Musculoskeletal: Positive for back pain. Negative for myalgias and neck pain.  Skin: Negative for rash.       Left breast mass  Neurological: Negative for dizziness, tremors, seizures, weakness and headaches.  Psychiatric/Behavioral: Positive for memory loss. Negative for suicidal ideas and hallucinations. The patient is not nervous/anxious.     Past Medical History  Diagnosis Date  . Osteoporosis   . Allergic rhinitis   . Dementia   . Osteoarthritis     hands  . IBS (irritable bowel syndrome)   . GERD (gastroesophageal reflux disease)   . Hypertension   . Palpitation     a. PAC's & PVC's by Holter - 2011  . Tortuous colon 2002  . Descending thoracic aortic dissection (HCC) 04/24/2010    a. MRA 2011 - begins distal to left subclavian and extends throughout the full length of the Ao.  Marland Kitchen Aortic insufficiency 04/16/2010    a. Echo 2011 - mild to moderate AI, EF 60%.  . Anemia, unspecified  01/12/2013  . Other abnormal blood chemistry 01/12/2013  . Lumbago 10/06/2012  . Personal history of fall 10/06/2012  . Hyposmolality and/or hyponatremia 09/29/2012  . Otitis externa 09/29/2012  . Impacted cerumen 09/29/2012  . Unspecified conductive hearing loss 09/29/2012  . External hemorrhoids without mention of complication 09/29/2012  . Unspecified venous (peripheral) insufficiency 09/29/2012  . Unspecified constipation 09/29/2012  . Abnormality of gait 09/29/2012  . Congestive heart failure, unspecified 06/30/2003  . Failure to thrive in adult 04/01/2015    Patient Active Problem List   Diagnosis Date Noted  . Hyponatremia 09/05/2015  . Acute urinary retention 08/28/2015  . Diarrhea 08/19/2015  . Loss of weight 10/01/2014  . Breast lump on left side at 7 o'clock position 07/02/2014  . Left hip pain 06/04/2014  . Depression with anxiety 12/06/2013  . Gait disorder 09/29/2013  . Fall 08/31/2013  . GERD (gastroesophageal reflux disease) 07/03/2013  . Urinary tract infection, site not specified 05/29/2013  . DNR (do not resuscitate) 04/13/2013  . Anemia 03/08/2013  . Hypertension   . Constipation 05/25/2011  . Descending thoracic aortic dissection (HCC) 04/24/2010  . Aortic insufficiency 04/16/2010  . CARDIOMEGALY 03/12/2010  . PALPITATIONS, HX OF 02/03/2010  . Vitamin D deficiency 01/07/2010  . DISTURBANCE, VISUAL NOS 06/27/2007  . OSTEOARTHROSIS, GENERALIZED, MULTIPLE SITES 06/27/2007  . Dementia, vascular 03/07/2007  . Allergic rhinitis 03/07/2007  . IRRITABLE BOWEL SYNDROME 03/07/2007  . OVERACTIVE BLADDER 03/07/2007  . OSTEOPOROSIS 03/07/2007    Allergies  Allergen Reactions  . Alendronate  Sodium     REACTION: GI    Medications: Patient's Medications  New Prescriptions   No medications on file  Previous Medications   ACETAMINOPHEN (TYLENOL) 325 MG TABLET    Take 650 mg by mouth every 6 (six) hours as needed.    AMLODIPINE (NORVASC) 5 MG TABLET    Take 5 mg by  mouth daily.   ARTIFICIAL TEAR SOLUTION (TEARS NATURALE II OP)    Apply 1 drop to eye daily. Both eyes twice daily   AZELASTINE (ASTELIN) 137 MCG/SPRAY NASAL SPRAY    Place 2 sprays into the nose. At bedtime   BISACODYL (DULCOLAX) 10 MG SUPPOSITORY    Place 10 mg rectally daily as needed for moderate constipation.   CHOLESTYRAMINE (QUESTRAN) 4 G PACKET    Take 4 g by mouth 3 (three) times daily with meals.   DOCUSATE SODIUM (ENEMEEZ) 283 MG ENEMA    Place 1 enema (283 mg total) rectally daily.   DONEPEZIL (ARICEPT) 10 MG TABLET    Take 1 tablet (10 mg total) by mouth daily.   DULOXETINE (CYMBALTA) 30 MG CAPSULE    Take 30 mg by mouth daily.   FAMOTIDINE (PEPCID) 20 MG TABLET    Take 20 mg by mouth at bedtime.   HYDROCODONE-ACETAMINOPHEN (NORCO/VICODIN) 5-325 MG PER TABLET    Take 1 tablet by mouth 4 (four) times daily.   MEMANTINE (NAMENDA) 10 MG TABLET    Take 10 mg by mouth 2 (two) times daily.    METOPROLOL (LOPRESSOR) 50 MG TABLET    Take 1 tablet (50 mg total) by mouth 2 (two) times daily.   METRONIDAZOLE (FLAGYL) 500 MG TABLET    Take 500 mg by mouth 3 (three) times daily.   MULTIPLE VITAMINS-MINERALS (CERTAVITE/ANTIOXIDANTS PO)    Take 1 tablet by mouth daily.   NUTRITIONAL SUPPLEMENTS (RESOURCE PO)    Take 120 mLs by mouth 3 (three) times daily. With med pass.   POLYETHYLENE GLYCOL (MIRALAX / GLYCOLAX) PACKET    Take 17 g by mouth daily as needed.    SENNA-DOCUSATE (SENOKOT-S) 8.6-50 MG TABLET    Take 1 tablet by mouth daily.  Modified Medications   No medications on file  Discontinued Medications   No medications on file    Physical Exam: There were no vitals filed for this visit. There is no weight on file to calculate BMI.  Physical Exam  Constitutional: She appears well-developed and well-nourished. No distress.  HENT:  Head: Normocephalic and atraumatic.  Nose: Nose normal.  Mouth/Throat: Oropharynx is clear and moist. No oropharyngeal exudate.  Eyes: Conjunctivae and  EOM are normal. Pupils are equal, round, and reactive to light. Right eye exhibits no discharge. Left eye exhibits no discharge. No scleral icterus.  Neck: Normal range of motion. Neck supple. No JVD present. No thyromegaly present.  Cardiovascular: Normal rate and regular rhythm.   Murmur heard. Systolic murmur appreciated at the left sternal border 2/6  Pulmonary/Chest: Effort normal and breath sounds normal. No respiratory distress. She has no wheezes. She has no rales.  Left breast mass.  Abdominal: She exhibits no distension. There is no tenderness.  BS x4 quadrants  Musculoskeletal: Normal range of motion. She exhibits tenderness. She exhibits no edema.  Pain in the right shoulder/shoulder blade, neck, middle/lower back-gait instability and frequent falling. C/o aches allover-better since Norco tid. Scoliokyphosis.   Lymphadenopathy:    She has no cervical adenopathy.  Neurological: She is alert. She has normal reflexes. No cranial nerve  deficit. She exhibits normal muscle tone. Coordination normal.  Skin: Skin is warm and dry. No rash noted. She is not diaphoretic. No erythema.  Mobile, smooth, a large marble size, non-tender at left breast 7 o'clock  Psychiatric: Her mood appears not anxious. Her affect is inappropriate. Her affect is not angry, not blunt and not labile. Her speech is delayed. Her speech is not rapid and/or pressured, not tangential and not slurred. She is slowed. She is not agitated, not aggressive, not hyperactive, not withdrawn and not actively hallucinating. Thought content is not paranoid and not delusional. Cognition and memory are impaired. She expresses impulsivity and inappropriate judgment. She does not exhibit a depressed mood. She is communicative. She exhibits abnormal recent memory. She exhibits normal remote memory. She is attentive.    Labs reviewed: Basic Metabolic Panel:  Recent Labs  16/08/9609/06/16 0930 09/06/15 09/10/15  NA 132* 131* 133*  K 3.5 4.6  4.3  CL 97*  --   --   CO2 26  --   --   GLUCOSE 103*  --   --   BUN 18 19 25*  CREATININE 0.51 0.7 0.6  CALCIUM 9.2  --   --     Liver Function Tests:  Recent Labs  08/29/15 08/29/15 0930  AST 18 24  ALT 14 18  ALKPHOS 37 43  BILITOT  --  0.7  PROT  --  6.9  ALBUMIN  --  3.6    CBC:  Recent Labs  08/29/15 08/29/15 0930  WBC 8.2 7.2  NEUTROABS  --  5.8  HGB 12.6 11.8*  HCT 36 36.4  MCV  --  92.2  PLT 144* 153    Lab Results  Component Value Date   TSH 0.90 10/02/2014   Lab Results  Component Value Date   HGBA1C 5.9 01/17/2013   Lab Results  Component Value Date   CHOL 181 01/07/2010   HDL 93.60 01/07/2010   LDLCALC 78 01/07/2010   TRIG 49.0 01/07/2010   CHOLHDL 2 01/07/2010    Significant Diagnostic Results since last visit: none  Patient Care Team: Kimber RelicArthur G Green, MD as PCP - General (Internal Medicine) Laurey Moralealton S McLean, MD as Consulting Physician (Cardiology)  Assessment/Plan Problem List Items Addressed This Visit    Dementia, vascular - Primary (Chronic)    SNF for care. Continue Aricept and Namenda. MMSE 22/30 10/26/14, gradual declining      Constipation (Chronic)    Resolved, continue the same tx      Hypertension (Chronic)    Controlled, continue Amlodipine 5mg  and Metoprolol 50mg  bid.      Depression with anxiety (Chronic)    Mood has been stabilized since off Mirtazapine and started Cymbalta 30mg  in setting of aches and pains 03/2015      OSTEOARTHROSIS, GENERALIZED, MULTIPLE SITES    better controlled, chronic mid back pain in thoracic spine area,  continue Norco       GERD (gastroesophageal reflux disease)    Asymptomatic      Hyponatremia    Last serum Na 133          Family/ staff Communication: observe for constipation  Labs/tests ordered: none  ManXie Zilphia Kozinski NP Geriatrics [facility]  [facility] [address] [place], [place] [address] On Call:  [phone] & follow prompts after 5pm &  weekends Office Phone:  [phone] Office Fax:  [phone]

## 2015-10-14 NOTE — Assessment & Plan Note (Signed)
better controlled, chronic mid back pain in thoracic spine area,  continue Norco  

## 2015-10-14 NOTE — Assessment & Plan Note (Signed)
SNF for care. Continue Aricept and Namenda. MMSE 22/30 10/26/14, gradual declining 

## 2015-10-14 NOTE — Assessment & Plan Note (Signed)
Controlled, continue Amlodipine 5mg and Metoprolol 50mg bid. 

## 2015-10-14 NOTE — Assessment & Plan Note (Signed)
Asymptomatic. 

## 2015-10-14 NOTE — Assessment & Plan Note (Signed)
Mood has been stabilized since off Mirtazapine and started Cymbalta 30mg in setting of aches and pains 03/2015  

## 2015-10-14 NOTE — Assessment & Plan Note (Signed)
Resolved, continue the same tx 

## 2015-10-30 ENCOUNTER — Non-Acute Institutional Stay (SKILLED_NURSING_FACILITY): Payer: Medicare Other | Admitting: Nurse Practitioner

## 2015-10-30 ENCOUNTER — Encounter: Payer: Self-pay | Admitting: Nurse Practitioner

## 2015-10-30 DIAGNOSIS — K219 Gastro-esophageal reflux disease without esophagitis: Secondary | ICD-10-CM

## 2015-10-30 DIAGNOSIS — R627 Adult failure to thrive: Secondary | ICD-10-CM

## 2015-10-30 DIAGNOSIS — K5900 Constipation, unspecified: Secondary | ICD-10-CM

## 2015-10-30 DIAGNOSIS — M159 Polyosteoarthritis, unspecified: Secondary | ICD-10-CM | POA: Diagnosis not present

## 2015-10-30 DIAGNOSIS — F0150 Vascular dementia without behavioral disturbance: Secondary | ICD-10-CM | POA: Diagnosis not present

## 2015-10-30 DIAGNOSIS — F418 Other specified anxiety disorders: Secondary | ICD-10-CM | POA: Diagnosis not present

## 2015-10-30 DIAGNOSIS — I10 Essential (primary) hypertension: Secondary | ICD-10-CM

## 2015-10-30 NOTE — Assessment & Plan Note (Addendum)
Stable. Continue Senna S daily, MiraLax prn.  

## 2015-10-30 NOTE — Progress Notes (Signed)
Patient ID: Jamie [name], female   DOB: 04/20/1920, 81 y.o.   MRN: [id number]  Location:  SNF [facility] Provider:  Chipper OmanManXie Enriqueta Augusta NP  Code Status:  DNR Goals of care: Advanced Directive information    Chief Complaint  Patient presents with  . Medical Management of Chronic Issues     HPI: Patient is a 81 y.o. female [facility] today for evaluation of back pain, HTN, GERD, depression, weight loss, constipation, dementia, last MMSE 21/30 09/25/15.   Review of Systems  Constitutional: Positive for weight loss. Negative for fever, chills, malaise/fatigue and diaphoresis.  HENT: Positive for hearing loss. Negative for congestion, ear discharge, ear pain and tinnitus.   Eyes: Negative for photophobia, pain, discharge and redness.  Respiratory: Negative for cough, shortness of breath and wheezing.   Cardiovascular: Negative for chest pain, palpitations and leg swelling.  Gastrointestinal: Negative for nausea, vomiting, abdominal pain and constipation.  Genitourinary: Positive for frequency. Negative for dysuria, urgency and flank pain.  Musculoskeletal: Positive for back pain. Negative for myalgias and neck pain.  Skin: Negative for rash.       Left breast mass  Neurological: Negative for dizziness, tremors, seizures, weakness and headaches.  Psychiatric/Behavioral: Positive for memory loss. Negative for suicidal ideas and hallucinations. The patient is not nervous/anxious.        09/25/15 MMSE 21/30    Past Medical History  Diagnosis Date  . Osteoporosis   . Allergic rhinitis   . Dementia   . Osteoarthritis     hands  . IBS (irritable bowel syndrome)   . GERD (gastroesophageal reflux disease)   . Hypertension   . Palpitation     a. PAC's & PVC's by Holter - 2011  . Tortuous colon 2002  . Descending thoracic aortic dissection (HCC) 04/24/2010    a. MRA 2011 - begins distal to left subclavian and extends throughout the full length of the Ao.  Marland Kitchen. Aortic  insufficiency 04/16/2010    a. Echo 2011 - mild to moderate AI, EF 60%.  . Anemia, unspecified 01/12/2013  . Other abnormal blood chemistry 01/12/2013  . Lumbago 10/06/2012  . Personal history of fall 10/06/2012  . Hyposmolality and/or hyponatremia 09/29/2012  . Otitis externa 09/29/2012  . Impacted cerumen 09/29/2012  . Unspecified conductive hearing loss 09/29/2012  . External hemorrhoids without mention of complication 09/29/2012  . Unspecified venous (peripheral) insufficiency 09/29/2012  . Unspecified constipation 09/29/2012  . Abnormality of gait 09/29/2012  . Congestive heart failure, unspecified 06/30/2003  . Failure to thrive in adult 04/01/2015    Patient Active Problem List   Diagnosis Date Noted  . Adult failure to thrive 10/30/2015  . Hyponatremia 09/05/2015  . Acute urinary retention 08/28/2015  . Diarrhea 08/19/2015  . Loss of weight 10/01/2014  . Breast lump on left side at 7 o'clock position 07/02/2014  . Left hip pain 06/04/2014  . Depression with anxiety 12/06/2013  . Gait disorder 09/29/2013  . Fall 08/31/2013  . GERD (gastroesophageal reflux disease) 07/03/2013  . Urinary tract infection, site not specified 05/29/2013  . DNR (do not resuscitate) 04/13/2013  . Anemia 03/08/2013  . Hypertension   . Constipation 05/25/2011  . Descending thoracic aortic dissection (HCC) 04/24/2010  . Aortic insufficiency 04/16/2010  . CARDIOMEGALY 03/12/2010  . PALPITATIONS, HX OF 02/03/2010  . Vitamin D deficiency 01/07/2010  . DISTURBANCE, VISUAL NOS 06/27/2007  . OSTEOARTHROSIS, GENERALIZED, MULTIPLE SITES 06/27/2007  . Dementia, vascular 03/07/2007  . Allergic rhinitis  03/07/2007  . IRRITABLE BOWEL SYNDROME 03/07/2007  . OVERACTIVE BLADDER 03/07/2007  . OSTEOPOROSIS 03/07/2007    Allergies  Allergen Reactions  . Alendronate Sodium     REACTION: GI  . Influenza Vaccines     Per [facility]    Medications: Patient's Medications  New Prescriptions   No  medications on file  Previous Medications   ACETAMINOPHEN (TYLENOL) 325 MG TABLET    Take 650 mg by mouth every 6 (six) hours as needed.    AMLODIPINE (NORVASC) 5 MG TABLET    Take 5 mg by mouth daily.   ARTIFICIAL TEAR SOLUTION (TEARS NATURALE II OP)    Apply 1 drop to eye daily. Both eyes twice daily   AZELASTINE (ASTELIN) 137 MCG/SPRAY NASAL SPRAY    Place 2 sprays into the nose. At bedtime   BISACODYL (DULCOLAX) 10 MG SUPPOSITORY    Place 10 mg rectally daily as needed for moderate constipation.   CHOLESTYRAMINE (QUESTRAN) 4 G PACKET    Take 4 g by mouth 3 (three) times daily with meals.   DOCUSATE SODIUM (ENEMEEZ) 283 MG ENEMA    Place 1 enema (283 mg total) rectally daily.   DONEPEZIL (ARICEPT) 10 MG TABLET    Take 1 tablet (10 mg total) by mouth daily.   DULOXETINE (CYMBALTA) 30 MG CAPSULE    Take 30 mg by mouth daily.   FAMOTIDINE (PEPCID) 20 MG TABLET    Take 20 mg by mouth at bedtime.   HYDROCODONE-ACETAMINOPHEN (NORCO/VICODIN) 5-325 MG PER TABLET    Take 1 tablet by mouth 4 (four) times daily.   MEMANTINE (NAMENDA) 10 MG TABLET    Take 10 mg by mouth 2 (two) times daily.    METOPROLOL (LOPRESSOR) 50 MG TABLET    Take 1 tablet (50 mg total) by mouth 2 (two) times daily.   METRONIDAZOLE (FLAGYL) 500 MG TABLET    Take 500 mg by mouth 3 (three) times daily.   MULTIPLE VITAMINS-MINERALS (CERTAVITE/ANTIOXIDANTS PO)    Take 1 tablet by mouth daily.   NUTRITIONAL SUPPLEMENTS (RESOURCE PO)    Take 120 mLs by mouth 3 (three) times daily. With med pass.   POLYETHYLENE GLYCOL (MIRALAX / GLYCOLAX) PACKET    Take 17 g by mouth daily as needed.    SENNA-DOCUSATE (SENOKOT-S) 8.6-50 MG TABLET    Take 1 tablet by mouth daily.  Modified Medications   No medications on file  Discontinued Medications   No medications on file    Physical Exam: Filed Vitals:   10/30/15 1219  BP: 118/76  Pulse: 72  Temp: 98.2 F (36.8 C)  TempSrc: Tympanic  Resp: 18   There is no weight on file to calculate  BMI.  Physical Exam  Constitutional: She appears well-developed and well-nourished. No distress.  HENT:  Head: Normocephalic and atraumatic.  Nose: Nose normal.  Mouth/Throat: Oropharynx is clear and moist. No oropharyngeal exudate.  Eyes: Conjunctivae and EOM are normal. Pupils are equal, round, and reactive to light. Right eye exhibits no discharge. Left eye exhibits no discharge. No scleral icterus.  Neck: Normal range of motion. Neck supple. No JVD present. No thyromegaly present.  Cardiovascular: Normal rate and regular rhythm.   Murmur heard. Systolic murmur appreciated at the left sternal border 2/6  Pulmonary/Chest: Effort normal and breath sounds normal. No respiratory distress. She has no wheezes. She has no rales.  Left breast mass.  Abdominal: She exhibits no distension. There is no tenderness.  BS x4 quadrants  Musculoskeletal: Normal range of motion. She exhibits tenderness. She exhibits no edema.  Pain in the right shoulder/shoulder blade, neck, middle/lower back-gait instability and frequent falling. C/o aches allover-better since Norco tid. Scoliokyphosis.   Lymphadenopathy:    She has no cervical adenopathy.  Neurological: She is alert. She has normal reflexes. No cranial nerve deficit. She exhibits normal muscle tone. Coordination normal.  Skin: Skin is warm and dry. No rash noted. She is not diaphoretic. No erythema.  Mobile, smooth, a large marble size, non-tender at left breast 7 o'clock  Psychiatric: Her mood appears not anxious. Her affect is inappropriate. Her affect is not angry, not blunt and not labile. Her speech is delayed. Her speech is not rapid and/or pressured, not tangential and not slurred. She is slowed. She is not agitated, not aggressive, not hyperactive, not withdrawn and not actively hallucinating. Thought content is not paranoid and not delusional. Cognition and memory are impaired. She expresses impulsivity and inappropriate judgment. She does not  exhibit a depressed mood. She is communicative. She exhibits abnormal recent memory. She exhibits normal remote memory. She is attentive.    Labs reviewed: Basic Metabolic Panel:  Recent Labs  16/10/96 0930 09/06/15 09/10/15  NA 132* 131* 133*  K 3.5 4.6 4.3  CL 97*  --   --   CO2 26  --   --   GLUCOSE 103*  --   --   BUN 18 19 25*  CREATININE 0.51 0.7 0.6  CALCIUM 9.2  --   --     Liver Function Tests:  Recent Labs  08/29/15 08/29/15 0930  AST 18 24  ALT 14 18  ALKPHOS 37 43  BILITOT  --  0.7  PROT  --  6.9  ALBUMIN  --  3.6    CBC:  Recent Labs  08/29/15 08/29/15 0930  WBC 8.2 7.2  NEUTROABS  --  5.8  HGB 12.6 11.8*  HCT 36 36.4  MCV  --  92.2  PLT 144* 153    Lab Results  Component Value Date   TSH 0.90 10/02/2014   Lab Results  Component Value Date   HGBA1C 5.9 01/17/2013   Lab Results  Component Value Date   CHOL 181 01/07/2010   HDL 93.60 01/07/2010   LDLCALC 78 01/07/2010   TRIG 49.0 01/07/2010   CHOLHDL 2 01/07/2010    Significant Diagnostic Results since last visit: none  Patient Care Team: Kimber Relic, MD as PCP - General (Internal Medicine) Laurey Morale, MD as Consulting Physician (Cardiology)  Assessment/Plan Problem List Items Addressed This Visit    Dementia, vascular - Primary (Chronic)    SNF for care. Continue Aricept and Namenda. MMSE 22/30 10/26/14, 21/30 09/25/15      Constipation (Chronic)    Stable. Continue Senna S daily, MiraLax prn.       Hypertension (Chronic)    Controlled, continue Amlodipine  and Metoprolol  bid      Depression with anxiety (Chronic)    Mood has been stabilized since off Mirtazapine and started Cymbalta  in setting of aches and pains 03/2015      OSTEOARTHROSIS, GENERALIZED, MULTIPLE SITES    Pain is better controlled, chronic mid back pain in thoracic spine area,  continue Norco       GERD (gastroesophageal reflux disease)    Stable, off PPI      Adult failure to  thrive    Continue supportive care. #117 Dec 2015, #102 Dec 2016  Family/ staff Communication: observe for constipation  Labs/tests ordered: none  [facility] Clora Ohmer NP Geriatrics [facility] [address], [place] [address] On Call:  [phone] & follow prompts after 5pm & weekends Office Phone:  [phone] Office Fax:  [phone]

## 2015-10-30 NOTE — Assessment & Plan Note (Signed)
Controlled, continue Amlodipine 5mg and Metoprolol 50mg bid. 

## 2015-10-30 NOTE — Assessment & Plan Note (Signed)
SNF for care. Continue Aricept and Namenda. MMSE 22/30 10/26/14, 21/30 09/25/15 

## 2015-10-30 NOTE — Assessment & Plan Note (Signed)
Continue supportive care. #117 Dec 2015, #102 Dec 2016

## 2015-10-30 NOTE — Assessment & Plan Note (Signed)
Mood has been stabilized since off Mirtazapine and started Cymbalta 30mg in setting of aches and pains 03/2015  

## 2015-10-30 NOTE — Assessment & Plan Note (Signed)
Pain is better controlled, chronic mid back pain in thoracic spine area,  continue Norco 

## 2015-10-30 NOTE — Assessment & Plan Note (Addendum)
Stable, off PPI  

## 2015-11-13 ENCOUNTER — Non-Acute Institutional Stay (SKILLED_NURSING_FACILITY): Payer: Medicare Other | Admitting: Nurse Practitioner

## 2015-11-13 ENCOUNTER — Encounter: Payer: Self-pay | Admitting: Nurse Practitioner

## 2015-11-13 DIAGNOSIS — F0150 Vascular dementia without behavioral disturbance: Secondary | ICD-10-CM

## 2015-11-13 DIAGNOSIS — K219 Gastro-esophageal reflux disease without esophagitis: Secondary | ICD-10-CM

## 2015-11-13 DIAGNOSIS — I10 Essential (primary) hypertension: Secondary | ICD-10-CM

## 2015-11-13 DIAGNOSIS — R627 Adult failure to thrive: Secondary | ICD-10-CM | POA: Diagnosis not present

## 2015-11-13 DIAGNOSIS — F418 Other specified anxiety disorders: Secondary | ICD-10-CM

## 2015-11-13 DIAGNOSIS — K5900 Constipation, unspecified: Secondary | ICD-10-CM

## 2015-11-13 DIAGNOSIS — E871 Hypo-osmolality and hyponatremia: Secondary | ICD-10-CM | POA: Diagnosis not present

## 2015-11-13 NOTE — Assessment & Plan Note (Signed)
Stable, off PPI  

## 2015-11-13 NOTE — Assessment & Plan Note (Signed)
Mood has been stabilized since off Mirtazapine and started Cymbalta 30mg in setting of aches and pains 03/2015  

## 2015-11-13 NOTE — Assessment & Plan Note (Signed)
Continue supportive care. #117 Dec 2015, #102 Dec 2016 

## 2015-11-13 NOTE — Assessment & Plan Note (Signed)
SNF for care. Continue Aricept and Namenda. MMSE 22/30 10/26/14, 21/30 09/25/15

## 2015-11-13 NOTE — Assessment & Plan Note (Signed)
Controlled, continue Amlodipine 5mg and Metoprolol 50mg bid. 

## 2015-11-13 NOTE — Progress Notes (Signed)
Patient ID: Jamie [name], female   DOB: 04/20/1920, 81 y.o.   MRN: [id number]  Location:  SNF [facility] Provider:  Chipper OmanManXie Don Tiu NP  Code Status:  DNR Goals of care: Advanced Directive information    Chief Complaint  Patient presents with  . Medical Management of Chronic Issues     HPI: Patient is a 81 y.o. female [facility] in [facility] today for evaluation of back pain, HTN, GERD, depression, weight loss, constipation, dementia, last MMSE 21/30 09/25/15.   Review of Systems  Constitutional: Positive for weight loss. Negative for fever, chills, malaise/fatigue and diaphoresis.  HENT: Positive for hearing loss. Negative for congestion, ear discharge, ear pain and tinnitus.   Eyes: Negative for photophobia, pain, discharge and redness.  Respiratory: Negative for cough, shortness of breath and wheezing.   Cardiovascular: Negative for chest pain, palpitations and leg swelling.  Gastrointestinal: Negative for nausea, vomiting, abdominal pain and constipation.  Genitourinary: Positive for frequency. Negative for dysuria, urgency and flank pain.  Musculoskeletal: Positive for back pain. Negative for myalgias and neck pain.  Skin: Negative for rash.       Left breast mass  Neurological: Negative for dizziness, tremors, seizures, weakness and headaches.  Psychiatric/Behavioral: Positive for memory loss. Negative for suicidal ideas and hallucinations. The patient is not nervous/anxious.        09/25/15 MMSE 21/30    Past Medical History  Diagnosis Date  . Osteoporosis   . Allergic rhinitis   . Dementia   . Osteoarthritis     hands  . IBS (irritable bowel syndrome)   . GERD (gastroesophageal reflux disease)   . Hypertension   . Palpitation     a. PAC's & PVC's by Holter - 2011  . Tortuous colon 2002  . Descending thoracic aortic dissection (HCC) 04/24/2010    a. MRA 2011 - begins distal to left subclavian and extends throughout the full length of the Ao.  Marland Kitchen. Aortic  insufficiency 04/16/2010    a. Echo 2011 - mild to moderate AI, EF 60%.  . Anemia, unspecified 01/12/2013  . Other abnormal blood chemistry 01/12/2013  . Lumbago 10/06/2012  . Personal history of fall 10/06/2012  . Hyposmolality and/or hyponatremia 09/29/2012  . Otitis externa 09/29/2012  . Impacted cerumen 09/29/2012  . Unspecified conductive hearing loss 09/29/2012  . External hemorrhoids without mention of complication 09/29/2012  . Unspecified venous (peripheral) insufficiency 09/29/2012  . Unspecified constipation 09/29/2012  . Abnormality of gait 09/29/2012  . Congestive heart failure, unspecified 06/30/2003  . Failure to thrive in adult 04/01/2015    Patient Active Problem List   Diagnosis Date Noted  . Adult failure to thrive 10/30/2015  . Hyponatremia 09/05/2015  . Acute urinary retention 08/28/2015  . Diarrhea 08/19/2015  . Loss of weight 10/01/2014  . Breast lump on left side at 7 o'clock position 07/02/2014  . Left hip pain 06/04/2014  . Depression with anxiety 12/06/2013  . Gait disorder 09/29/2013  . Fall 08/31/2013  . GERD (gastroesophageal reflux disease) 07/03/2013  . Urinary tract infection, site not specified 05/29/2013  . DNR (do not resuscitate) 04/13/2013  . Anemia 03/08/2013  . Hypertension   . Constipation 05/25/2011  . Descending thoracic aortic dissection (HCC) 04/24/2010  . Aortic insufficiency 04/16/2010  . CARDIOMEGALY 03/12/2010  . PALPITATIONS, HX OF 02/03/2010  . Vitamin D deficiency 01/07/2010  . DISTURBANCE, VISUAL NOS 06/27/2007  . OSTEOARTHROSIS, GENERALIZED, MULTIPLE SITES 06/27/2007  . Dementia, vascular 03/07/2007  . Allergic rhinitis  03/07/2007  . IRRITABLE BOWEL SYNDROME 03/07/2007  . OVERACTIVE BLADDER 03/07/2007  . OSTEOPOROSIS 03/07/2007    Allergies  Allergen Reactions  . Alendronate Sodium     REACTION: GI  . Influenza Vaccines     Per [facility]    Medications: Patient's Medications  New Prescriptions   No  medications on file  Previous Medications   ACETAMINOPHEN (TYLENOL) 325 MG TABLET    Take 650 mg by mouth every 6 (six) hours as needed.    AMLODIPINE (NORVASC) 5 MG TABLET    Take 5 mg by mouth daily.   ARTIFICIAL TEAR SOLUTION (TEARS NATURALE II OP)    Apply 1 drop to eye daily. Both eyes twice daily   AZELASTINE (ASTELIN) 137 MCG/SPRAY NASAL SPRAY    Place 2 sprays into the nose. At bedtime   BISACODYL (DULCOLAX) 10 MG SUPPOSITORY    Place 10 mg rectally daily as needed for moderate constipation.   CHOLESTYRAMINE (QUESTRAN) 4 G PACKET    Take 4 g by mouth 3 (three) times daily with meals.   DOCUSATE SODIUM (ENEMEEZ) 283 MG ENEMA    Place 1 enema (283 mg total) rectally daily.   DONEPEZIL (ARICEPT) 10 MG TABLET    Take 1 tablet (10 mg total) by mouth daily.   DULOXETINE (CYMBALTA) 30 MG CAPSULE    Take 30 mg by mouth daily.   FAMOTIDINE (PEPCID) 20 MG TABLET    Take 20 mg by mouth at bedtime.   HYDROCODONE-ACETAMINOPHEN (NORCO/VICODIN) 5-325 MG PER TABLET    Take 1 tablet by mouth 4 (four) times daily.   MEMANTINE (NAMENDA) 10 MG TABLET    Take 10 mg by mouth 2 (two) times daily.    METOPROLOL (LOPRESSOR) 50 MG TABLET    Take 1 tablet (50 mg total) by mouth 2 (two) times daily.   METRONIDAZOLE (FLAGYL) 500 MG TABLET    Take 500 mg by mouth 3 (three) times daily.   MULTIPLE VITAMINS-MINERALS (CERTAVITE/ANTIOXIDANTS PO)    Take 1 tablet by mouth daily.   NUTRITIONAL SUPPLEMENTS (RESOURCE PO)    Take 120 mLs by mouth 3 (three) times daily. With med pass.   POLYETHYLENE GLYCOL (MIRALAX / GLYCOLAX) PACKET    Take 17 g by mouth daily as needed.    SENNA-DOCUSATE (SENOKOT-S) 8.6-50 MG TABLET    Take 1 tablet by mouth daily.  Modified Medications   No medications on file  Discontinued Medications   No medications on file    Physical Exam: Filed Vitals:   11/13/15 1139  BP: 120/70  Pulse: 72  Temp: 97.8 F (36.6 C)  TempSrc: Tympanic  Resp: 18   There is no weight on file to calculate  BMI.  Physical Exam  Constitutional: She appears well-developed and well-nourished. No distress.  HENT:  Head: Normocephalic and atraumatic.  Nose: Nose normal.  Mouth/Throat: Oropharynx is clear and moist. No oropharyngeal exudate.  Eyes: Conjunctivae and EOM are normal. Pupils are equal, round, and reactive to light. Right eye exhibits no discharge. Left eye exhibits no discharge. No scleral icterus.  Neck: Normal range of motion. Neck supple. No JVD present. No thyromegaly present.  Cardiovascular: Normal rate and regular rhythm.   Murmur heard. Systolic murmur appreciated at the left sternal border 2/6  Pulmonary/Chest: Effort normal and breath sounds normal. No respiratory distress. She has no wheezes. She has no rales.  Left breast mass.  Abdominal: She exhibits no distension. There is no tenderness.  BS x4 quadrants  Musculoskeletal: Normal range of motion. She exhibits tenderness. She exhibits no edema.  Pain in the right shoulder/shoulder blade, neck, middle/lower back-gait instability and frequent falling. C/o aches allover-better since Norco tid. Scoliokyphosis.   Lymphadenopathy:    She has no cervical adenopathy.  Neurological: She is alert. She has normal reflexes. No cranial nerve deficit. She exhibits normal muscle tone. Coordination normal.  Skin: Skin is warm and dry. No rash noted. She is not diaphoretic. No erythema.  Mobile, smooth, a large marble size, non-tender at left breast 7 o'clock  Psychiatric: Her mood appears not anxious. Her affect is inappropriate. Her affect is not angry, not blunt and not labile. Her speech is delayed. Her speech is not rapid and/or pressured, not tangential and not slurred. She is slowed. She is not agitated, not aggressive, not hyperactive, not withdrawn and not actively hallucinating. Thought content is not paranoid and not delusional. Cognition and memory are impaired. She expresses impulsivity and inappropriate judgment. She does not  exhibit a depressed mood. She is communicative. She exhibits abnormal recent memory. She exhibits normal remote memory. She is attentive.    Labs reviewed: Basic Metabolic Panel:  Recent Labs  16/08/9609/06/16 0930 09/06/15 09/10/15  NA 132* 131* 133*  K 3.5 4.6 4.3  CL 97*  --   --   CO2 26  --   --   GLUCOSE 103*  --   --   BUN 18 19 25*  CREATININE 0.51 0.7 0.6  CALCIUM 9.2  --   --     Liver Function Tests:  Recent Labs  08/29/15 08/29/15 0930  AST 18 24  ALT 14 18  ALKPHOS 37 43  BILITOT  --  0.7  PROT  --  6.9  ALBUMIN  --  3.6    CBC:  Recent Labs  08/29/15 08/29/15 0930  WBC 8.2 7.2  NEUTROABS  --  5.8  HGB 12.6 11.8*  HCT 36 36.4  MCV  --  92.2  PLT 144* 153    Lab Results  Component Value Date   TSH 0.90 10/02/2014   Lab Results  Component Value Date   HGBA1C 5.9 01/17/2013   Lab Results  Component Value Date   CHOL 181 01/07/2010   HDL 93.60 01/07/2010   LDLCALC 78 01/07/2010   TRIG 49.0 01/07/2010   CHOLHDL 2 01/07/2010    Significant Diagnostic Results since last visit: none  Patient Care Team: Kimber RelicArthur G Green, MD as PCP - General (Internal Medicine) Laurey Moralealton S McLean, MD as Consulting Physician (Cardiology)  Assessment/Plan Problem List Items Addressed This Visit    Adult failure to thrive - Primary    Continue supportive care. #117 Dec 2015, #102 Dec 2016      Constipation (Chronic)    Stable. Continue Senna S daily, MiraLax prn.       Dementia, vascular (Chronic)    SNF for care. Continue Aricept and Namenda. MMSE 22/30 10/26/14, 21/30 09/25/15      Depression with anxiety (Chronic)    Mood has been stabilized since off Mirtazapine and started Cymbalta 30mg  in setting of aches and pains 03/2015      GERD (gastroesophageal reflux disease)    Stable, off PPI      Hypertension (Chronic)    Controlled, continue Amlodipine 5mg  and Metoprolol 50mg  bid      Hyponatremia    Last serum Na 133 09/10/15          Family/  staff Communication: observe for constipation  Labs/tests ordered: none  ManXie Lorinda Copland NP Geriatrics [facility] [address] [place], [place] [address] On Call:  [phone] & follow prompts after 5pm & weekends Office Phone:  [phone] Office Fax:  [phone]

## 2015-11-13 NOTE — Assessment & Plan Note (Signed)
Last serum Na 133 09/10/15

## 2015-11-13 NOTE — Assessment & Plan Note (Signed)
Stable. Continue Senna S daily, MiraLax prn.

## 2015-12-09 ENCOUNTER — Non-Acute Institutional Stay (SKILLED_NURSING_FACILITY): Payer: Medicare Other | Admitting: Nurse Practitioner

## 2015-12-09 ENCOUNTER — Encounter: Payer: Self-pay | Admitting: Nurse Practitioner

## 2015-12-09 DIAGNOSIS — F0150 Vascular dementia without behavioral disturbance: Secondary | ICD-10-CM

## 2015-12-09 DIAGNOSIS — E871 Hypo-osmolality and hyponatremia: Secondary | ICD-10-CM | POA: Diagnosis not present

## 2015-12-09 DIAGNOSIS — K5900 Constipation, unspecified: Secondary | ICD-10-CM

## 2015-12-09 DIAGNOSIS — M159 Polyosteoarthritis, unspecified: Secondary | ICD-10-CM

## 2015-12-09 DIAGNOSIS — K219 Gastro-esophageal reflux disease without esophagitis: Secondary | ICD-10-CM

## 2015-12-09 DIAGNOSIS — F418 Other specified anxiety disorders: Secondary | ICD-10-CM | POA: Diagnosis not present

## 2015-12-09 DIAGNOSIS — R627 Adult failure to thrive: Secondary | ICD-10-CM

## 2015-12-09 DIAGNOSIS — I10 Essential (primary) hypertension: Secondary | ICD-10-CM

## 2015-12-09 NOTE — Assessment & Plan Note (Signed)
Controlled, continue Amlodipine 5mg and Metoprolol 50mg bid. 

## 2015-12-09 NOTE — Assessment & Plan Note (Signed)
Mood has been stabilized since off Mirtazapine and started Cymbalta 30mg in setting of aches and pains 03/2015  

## 2015-12-09 NOTE — Assessment & Plan Note (Signed)
Stable, off PPI  

## 2015-12-09 NOTE — Assessment & Plan Note (Signed)
Stable. Continue Senna S daily, MiraLax prn.  

## 2015-12-09 NOTE — Assessment & Plan Note (Signed)
Continue supportive care. #117 Dec 2015, #102 Dec 2016 

## 2015-12-09 NOTE — Assessment & Plan Note (Signed)
Pain is better controlled, chronic mid back pain in thoracic spine area,  continue Norco 

## 2015-12-09 NOTE — Assessment & Plan Note (Signed)
Last serum Na 133 09/10/15 

## 2015-12-09 NOTE — Progress Notes (Signed)
Patient ID: Jamie [name], female   DOB: 04/20/1920, 82 y.o.   MRN: [id number]  Location:  SNF [facility] Provider:  Chipper OmanManXie Jaxsyn Catalfamo NP  Code Status:  DNR Goals of care: Advanced Directive information    Chief Complaint  Patient presents with  . Medical Management of Chronic Issues     HPI: Patient is a 82 y.o. female [facility] SNF [facility] today for evaluation of back pain, HTN, GERD, depression, weight loss, constipation, dementia, last MMSE 21/30 09/25/15.   Review of Systems  Constitutional: Positive for weight loss. Negative for fever, chills, malaise/fatigue and diaphoresis.  HENT: Positive for hearing loss. Negative for congestion, ear discharge, ear pain and tinnitus.   Eyes: Negative for photophobia, pain, discharge and redness.  Respiratory: Negative for cough, shortness of breath and wheezing.   Cardiovascular: Negative for chest pain, palpitations and leg swelling.  Gastrointestinal: Negative for nausea, vomiting, abdominal pain and constipation.  Genitourinary: Positive for frequency. Negative for dysuria, urgency and flank pain.  Musculoskeletal: Positive for back pain. Negative for myalgias and neck pain.  Skin: Negative for rash.       Left breast mass  Neurological: Negative for dizziness, tremors, seizures, weakness and headaches.  Psychiatric/Behavioral: Positive for memory loss. Negative for suicidal ideas and hallucinations. The patient is not nervous/anxious.        09/25/15 MMSE 21/30    Past Medical History  Diagnosis Date  . Osteoporosis   . Allergic rhinitis   . Dementia   . Osteoarthritis     hands  . IBS (irritable bowel syndrome)   . GERD (gastroesophageal reflux disease)   . Hypertension   . Palpitation     a. PAC's & PVC's by Holter - 2011  . Tortuous colon 2002  . Descending thoracic aortic dissection (HCC) 04/24/2010    a. MRA 2011 - begins distal to left subclavian and extends throughout the full length of the Ao.  Marland Kitchen. Aortic  insufficiency 04/16/2010    a. Echo 2011 - mild to moderate AI, EF 60%.  . Anemia, unspecified 01/12/2013  . Other abnormal blood chemistry 01/12/2013  . Lumbago 10/06/2012  . Personal history of fall 10/06/2012  . Hyposmolality and/or hyponatremia 09/29/2012  . Otitis externa 09/29/2012  . Impacted cerumen 09/29/2012  . Unspecified conductive hearing loss 09/29/2012  . External hemorrhoids without mention of complication 09/29/2012  . Unspecified venous (peripheral) insufficiency 09/29/2012  . Unspecified constipation 09/29/2012  . Abnormality of gait 09/29/2012  . Congestive heart failure, unspecified 06/30/2003  . Failure to thrive in adult 04/01/2015    Patient Active Problem List   Diagnosis Date Noted  . Adult failure to thrive 10/30/2015  . Hyponatremia 09/05/2015  . Acute urinary retention 08/28/2015  . Diarrhea 08/19/2015  . Loss of weight 10/01/2014  . Breast lump on left side at 7 o'clock position 07/02/2014  . Left hip pain 06/04/2014  . Depression with anxiety 12/06/2013  . Gait disorder 09/29/2013  . Fall 08/31/2013  . GERD (gastroesophageal reflux disease) 07/03/2013  . Urinary tract infection, site not specified 05/29/2013  . DNR (do not resuscitate) 04/13/2013  . Anemia 03/08/2013  . Hypertension   . Constipation 05/25/2011  . Descending thoracic aortic dissection (HCC) 04/24/2010  . Aortic insufficiency 04/16/2010  . CARDIOMEGALY 03/12/2010  . PALPITATIONS, HX OF 02/03/2010  . Vitamin D deficiency 01/07/2010  . DISTURBANCE, VISUAL NOS 06/27/2007  . OSTEOARTHROSIS, GENERALIZED, MULTIPLE SITES 06/27/2007  . Dementia, vascular 03/07/2007  . Allergic rhinitis  03/07/2007  . IRRITABLE BOWEL SYNDROME 03/07/2007  . OVERACTIVE BLADDER 03/07/2007  . OSTEOPOROSIS 03/07/2007    Allergies  Allergen Reactions  . Alendronate Sodium     REACTION: GI  . Influenza Vaccines     Per [facility]    Medications: Patient's Medications  New Prescriptions   No  medications on file  Previous Medications   ACETAMINOPHEN (TYLENOL) 325 MG TABLET    Take 650 mg by mouth every 6 (six) hours as needed.    AMLODIPINE (NORVASC) 5 MG TABLET    Take 5 mg by mouth daily.   ARTIFICIAL TEAR SOLUTION (TEARS NATURALE II OP)    Apply 1 drop to eye daily. Both eyes twice daily   AZELASTINE (ASTELIN) 137 MCG/SPRAY NASAL SPRAY    Place 2 sprays into the nose. At bedtime   BISACODYL (DULCOLAX) 10 MG SUPPOSITORY    Place 10 mg rectally daily as needed for moderate constipation.   CHOLESTYRAMINE (QUESTRAN) 4 G PACKET    Take 4 g by mouth 3 (three) times daily with meals.   DOCUSATE SODIUM (ENEMEEZ) 283 MG ENEMA    Place 1 enema (283 mg total) rectally daily.   DONEPEZIL (ARICEPT) 10 MG TABLET    Take 1 tablet (10 mg total) by mouth daily.   DULOXETINE (CYMBALTA) 30 MG CAPSULE    Take 30 mg by mouth daily.   FAMOTIDINE (PEPCID) 20 MG TABLET    Take 20 mg by mouth at bedtime.   HYDROCODONE-ACETAMINOPHEN (NORCO/VICODIN) 5-325 MG PER TABLET    Take 1 tablet by mouth 4 (four) times daily.   MEMANTINE (NAMENDA) 10 MG TABLET    Take 10 mg by mouth 2 (two) times daily.    METOPROLOL (LOPRESSOR) 50 MG TABLET    Take 1 tablet (50 mg total) by mouth 2 (two) times daily.   METRONIDAZOLE (FLAGYL) 500 MG TABLET    Take 500 mg by mouth 3 (three) times daily.   MULTIPLE VITAMINS-MINERALS (CERTAVITE/ANTIOXIDANTS PO)    Take 1 tablet by mouth daily.   NUTRITIONAL SUPPLEMENTS (RESOURCE PO)    Take 120 mLs by mouth 3 (three) times daily. With med pass.   POLYETHYLENE GLYCOL (MIRALAX / GLYCOLAX) PACKET    Take 17 g by mouth daily as needed.    SENNA-DOCUSATE (SENOKOT-S) 8.6-50 MG TABLET    Take 1 tablet by mouth daily.  Modified Medications   No medications on file  Discontinued Medications   No medications on file    Physical Exam: Filed Vitals:   12/09/15 1611  BP: 126/62  Pulse: 68  Temp: 99 F (37.2 C)  TempSrc: Tympanic  Resp: 16   There is no weight on file to calculate  BMI.  Physical Exam  Constitutional: She appears well-developed and well-nourished. No distress.  HENT:  Head: Normocephalic and atraumatic.  Nose: Nose normal.  Mouth/Throat: Oropharynx is clear and moist. No oropharyngeal exudate.  Eyes: Conjunctivae and EOM are normal. Pupils are equal, round, and reactive to light. Right eye exhibits no discharge. Left eye exhibits no discharge. No scleral icterus.  Neck: Normal range of motion. Neck supple. No JVD present. No thyromegaly present.  Cardiovascular: Normal rate and regular rhythm.   Murmur heard. Systolic murmur appreciated at the left sternal border 2/6  Pulmonary/Chest: Effort normal and breath sounds normal. No respiratory distress. She has no wheezes. She has no rales.  Left breast mass.  Abdominal: She exhibits no distension. There is no tenderness.  BS x4 quadrants  Musculoskeletal: Normal range of motion. She exhibits tenderness. She exhibits no edema.  Pain in the right shoulder/shoulder blade, neck, middle/lower back-gait instability and frequent falling. C/o aches allover-better since Norco tid. Scoliokyphosis.   Lymphadenopathy:    She has no cervical adenopathy.  Neurological: She is alert. She has normal reflexes. No cranial nerve deficit. She exhibits normal muscle tone. Coordination normal.  Skin: Skin is warm and dry. No rash noted. She is not diaphoretic. No erythema.  Mobile, smooth, a large marble size, non-tender at left breast 7 o'clock  Psychiatric: Her mood appears not anxious. Her affect is inappropriate. Her affect is not angry, not blunt and not labile. Her speech is delayed. Her speech is not rapid and/or pressured, not tangential and not slurred. She is slowed. She is not agitated, not aggressive, not hyperactive, not withdrawn and not actively hallucinating. Thought content is not paranoid and not delusional. Cognition and memory are impaired. She expresses impulsivity and inappropriate judgment. She does not  exhibit a depressed mood. She is communicative. She exhibits abnormal recent memory. She exhibits normal remote memory. She is attentive.    Labs reviewed: Basic Metabolic Panel:  Recent Labs  96/04/54 0930 09/06/15 09/10/15  NA 132* 131* 133*  K 3.5 4.6 4.3  CL 97*  --   --   CO2 26  --   --   GLUCOSE 103*  --   --   BUN 18 19 25*  CREATININE 0.51 0.7 0.6  CALCIUM 9.2  --   --     Liver Function Tests:  Recent Labs  08/29/15 08/29/15 0930  AST 18 24  ALT 14 18  ALKPHOS 37 43  BILITOT  --  0.7  PROT  --  6.9  ALBUMIN  --  3.6    CBC:  Recent Labs  08/29/15 08/29/15 0930  WBC 8.2 7.2  NEUTROABS  --  5.8  HGB 12.6 11.8*  HCT 36 36.4  MCV  --  92.2  PLT 144* 153    Lab Results  Component Value Date   TSH 0.90 10/02/2014   Lab Results  Component Value Date   HGBA1C 5.9 01/17/2013   Lab Results  Component Value Date   CHOL 181 01/07/2010   HDL 93.60 01/07/2010   LDLCALC 78 01/07/2010   TRIG 49.0 01/07/2010   CHOLHDL 2 01/07/2010    Significant Diagnostic Results since last visit: none  Patient Care Team: Kimber Relic, MD as PCP - General (Internal Medicine) Laurey Morale, MD as Consulting Physician (Cardiology)  Assessment/Plan Problem List Items Addressed This Visit    OSTEOARTHROSIS, GENERALIZED, MULTIPLE SITES - Primary    Pain is better controlled, chronic mid back pain in thoracic spine area,  continue Norco       Hyponatremia    Last serum Na 133 09/10/15      Hypertension (Chronic)    Controlled, continue Amlodipine 5mg  and Metoprolol 50mg  bid      GERD (gastroesophageal reflux disease)    Stable, off PPI      Depression with anxiety (Chronic)    Mood has been stabilized since off Mirtazapine and started Cymbalta 30mg  in setting of aches and pains 03/2015      Dementia, vascular (Chronic)    SNF for care. Continue Aricept and Namenda. MMSE 22/30 10/26/14, 21/30 09/25/15      Constipation (Chronic)    Stable. Continue  Senna S daily, MiraLax prn.       Adult failure to thrive  Continue supportive care. #117 Dec 2015, #102 Dec 2016          Family/ staff Communication: observe for constipation  Labs/tests ordered: none  Chipper OmanManXie Jayleon Mcfarlane NP Geriatrics [facility] [address] [place], [place] [address] On Call:  [phone] & follow prompts after 5pm & weekends Office Phone:  [phone] Office Fax:  [phone]

## 2015-12-09 NOTE — Assessment & Plan Note (Signed)
SNF for care. Continue Aricept and Namenda. MMSE 22/30 10/26/14, 21/30 09/25/15 

## 2016-01-01 ENCOUNTER — Non-Acute Institutional Stay (SKILLED_NURSING_FACILITY): Payer: Medicare Other | Admitting: Nurse Practitioner

## 2016-01-01 ENCOUNTER — Encounter: Payer: Self-pay | Admitting: Nurse Practitioner

## 2016-01-01 DIAGNOSIS — K219 Gastro-esophageal reflux disease without esophagitis: Secondary | ICD-10-CM

## 2016-01-01 DIAGNOSIS — M159 Polyosteoarthritis, unspecified: Secondary | ICD-10-CM | POA: Diagnosis not present

## 2016-01-01 DIAGNOSIS — R627 Adult failure to thrive: Secondary | ICD-10-CM | POA: Diagnosis not present

## 2016-01-01 DIAGNOSIS — F0150 Vascular dementia without behavioral disturbance: Secondary | ICD-10-CM | POA: Diagnosis not present

## 2016-01-01 DIAGNOSIS — I10 Essential (primary) hypertension: Secondary | ICD-10-CM | POA: Diagnosis not present

## 2016-01-01 DIAGNOSIS — K5900 Constipation, unspecified: Secondary | ICD-10-CM | POA: Diagnosis not present

## 2016-01-01 DIAGNOSIS — F418 Other specified anxiety disorders: Secondary | ICD-10-CM

## 2016-01-01 DIAGNOSIS — E871 Hypo-osmolality and hyponatremia: Secondary | ICD-10-CM | POA: Diagnosis not present

## 2016-01-01 NOTE — Assessment & Plan Note (Signed)
Pain is better controlled, chronic mid back pain in thoracic spine area,  continue Norco 

## 2016-01-01 NOTE — Assessment & Plan Note (Signed)
Mood has been stabilized since off Mirtazapine and started Cymbalta 30mg in setting of aches and pains 03/2015  

## 2016-01-01 NOTE — Assessment & Plan Note (Signed)
Last serum Na 133 09/10/15 

## 2016-01-01 NOTE — Progress Notes (Signed)
Patient ID: Jamie [name], female   DOB: 23-May-1920, 82 y.o.   MRN: [id number]  Location:  SNF [facility] Provider:  Chipper Oman NP  Code Status:  DNR Goals of care: Advanced Directive information Does patient have an advance directive?: Yes, Type of Advance Directive: Healthcare Power of [place];Out of facility DNR (pink MOST or yellow form)  Chief Complaint  Patient presents with  . Medical Management of Chronic Issues    Routine Visit     HPI: Patient is a 82 y.o. female seen in the SNF at [facility] today for evaluation of back pain, HTN, GERD, depression, weight loss, constipation, dementia, last MMSE 21/30 09/25/15.   Review of Systems  Constitutional: Positive for weight loss. Negative for fever, chills, malaise/fatigue and diaphoresis.  HENT: Positive for hearing loss. Negative for congestion, ear discharge, ear pain and tinnitus.   Eyes: Negative for photophobia, pain, discharge and redness.  Respiratory: Negative for cough, shortness of breath and wheezing.   Cardiovascular: Negative for chest pain, palpitations and leg swelling.  Gastrointestinal: Negative for nausea, vomiting, abdominal pain and constipation.  Genitourinary: Positive for frequency. Negative for dysuria, urgency and flank pain.  Musculoskeletal: Positive for back pain. Negative for myalgias and neck pain.  Skin: Negative for rash.       Left breast mass  Neurological: Negative for dizziness, tremors, seizures, weakness and headaches.  Psychiatric/Behavioral: Positive for memory loss. Negative for suicidal ideas and hallucinations. The patient is not nervous/anxious.        09/25/15 MMSE 21/30    Past Medical History  Diagnosis Date  . Osteoporosis   . Allergic rhinitis   . Dementia   . Osteoarthritis     hands  . IBS (irritable bowel syndrome)   . GERD (gastroesophageal reflux disease)   . Hypertension   . Palpitation     a. PAC's & PVC's by Holter - 2011  . Tortuous colon 2002  . Descending  thoracic aortic dissection (HCC) 04/24/2010    a. MRA 2011 - begins distal to left subclavian and extends throughout the full length of the Ao.  Marland Kitchen Aortic insufficiency 04/16/2010    a. Echo 2011 - mild to moderate AI, EF 60%.  . Anemia, unspecified 01/12/2013  . Other abnormal blood chemistry 01/12/2013  . Lumbago 10/06/2012  . Personal history of fall 10/06/2012  . Hyposmolality and/or hyponatremia 09/29/2012  . Otitis externa 09/29/2012  . Impacted cerumen 09/29/2012  . Unspecified conductive hearing loss 09/29/2012  . External hemorrhoids without mention of complication 09/29/2012  . Unspecified venous (peripheral) insufficiency 09/29/2012  . Unspecified constipation 09/29/2012  . Abnormality of gait 09/29/2012  . Congestive heart failure, unspecified 06/30/2003  . Failure to thrive in adult 04/01/2015    Patient Active Problem List   Diagnosis Date Noted  . Adult failure to thrive 10/30/2015  . Hyponatremia 09/05/2015  . Acute urinary retention 08/28/2015  . Diarrhea 08/19/2015  . Loss of weight 10/01/2014  . Breast lump on left side at 7 o'clock position 07/02/2014  . Left hip pain 06/04/2014  . Depression with anxiety 12/06/2013  . Gait disorder 09/29/2013  . Fall 08/31/2013  . GERD (gastroesophageal reflux disease) 07/03/2013  . Urinary tract infection, site not specified 05/29/2013  . DNR (do not resuscitate) 04/13/2013  . Anemia 03/08/2013  . Hypertension   . Constipation 05/25/2011  . Descending thoracic aortic dissection (HCC) 04/24/2010  . Aortic insufficiency 04/16/2010  . CARDIOMEGALY 03/12/2010  . PALPITATIONS, HX OF 02/03/2010  .  Vitamin D deficiency 01/07/2010  . DISTURBANCE, VISUAL NOS 06/27/2007  . OSTEOARTHROSIS, GENERALIZED, MULTIPLE SITES 06/27/2007  . Dementia, vascular 03/07/2007  . Allergic rhinitis 03/07/2007  . IRRITABLE BOWEL SYNDROME 03/07/2007  . OVERACTIVE BLADDER 03/07/2007  . OSTEOPOROSIS 03/07/2007    Allergies  Allergen Reactions  .  Alendronate Sodium     REACTION: GI  . Influenza Vaccines     Per [facility]    Medications: Patient's Medications  New Prescriptions   No medications on file  Previous Medications   ACETAMINOPHEN (TYLENOL) 325 MG TABLET    Take 650 mg by mouth every 6 (six) hours as needed.    AMLODIPINE (NORVASC) 5 MG TABLET    Take 5 mg by mouth daily.   ARTIFICIAL TEAR SOLUTION (TEARS NATURALE II OP)    Apply 1 drop to eye daily. Both eyes twice daily   DONEPEZIL (ARICEPT) 10 MG TABLET    Take 1 tablet (10 mg total) by mouth daily.   DULOXETINE (CYMBALTA) 30 MG CAPSULE    Take 30 mg by mouth daily.   HYDROCODONE-ACETAMINOPHEN (NORCO/VICODIN) 5-325 MG PER TABLET    Take 1 tablet by mouth 4 (four) times daily.   MEMANTINE (NAMENDA) 10 MG TABLET    Take 10 mg by mouth 2 (two) times daily.    METOPROLOL (LOPRESSOR) 50 MG TABLET    Take 1 tablet (50 mg total) by mouth 2 (two) times daily.   MULTIPLE VITAMINS-MINERALS (CERTAVITE/ANTIOXIDANTS PO)    Take 1 tablet by mouth daily.   NUTRITIONAL SUPPLEMENTS (RESOURCE PO)    Take 120 mLs by mouth 3 (three) times daily. With med pass.   POLYETHYLENE GLYCOL (MIRALAX / GLYCOLAX) PACKET    Take 17 g by mouth daily as needed.    SENNA-DOCUSATE (SENOKOT-S) 8.6-50 MG TABLET    Take 1 tablet by mouth daily.  Modified Medications   No medications on file  Discontinued Medications   AZELASTINE (ASTELIN) 137 MCG/SPRAY NASAL SPRAY    Place 2 sprays into the nose. At bedtime   BISACODYL (DULCOLAX) 10 MG SUPPOSITORY    Place 10 mg rectally daily as needed for moderate constipation.   CHOLESTYRAMINE (QUESTRAN) 4 G PACKET    Take 4 g by mouth 3 (three) times daily with meals.   DOCUSATE SODIUM (ENEMEEZ) 283 MG ENEMA    Place 1 enema (283 mg total) rectally daily.   FAMOTIDINE (PEPCID) 20 MG TABLET    Take 20 mg by mouth at bedtime.   METRONIDAZOLE (FLAGYL) 500 MG TABLET    Take 500 mg by mouth 3 (three) times daily. Reported on 01/01/2016    Physical Exam: Filed  Vitals:   01/01/16 0828  BP: 118/70  Pulse: 82  Temp: 98 F (36.7 C)  TempSrc: Oral  Resp: 20  Height:  (1.651 m)  Weight: 104 lb 8 oz (47.401 kg)   Body mass index is 17.39 kg/(m^2).  Physical Exam  Constitutional: She appears well-developed and well-nourished. No distress.  HENT:  Head: Normocephalic and atraumatic.  Nose: Nose normal.  Mouth/Throat: Oropharynx is clear and moist. No oropharyngeal exudate.  Eyes: Conjunctivae and EOM are normal. Pupils are equal, round, and reactive to light. Right eye exhibits no discharge. Left eye exhibits no discharge. No scleral icterus.  Neck: Normal range of motion. Neck supple. No JVD present. No thyromegaly present.  Cardiovascular: Normal rate and regular rhythm.   Murmur heard. Systolic murmur appreciated at the left sternal border 2/6  Pulmonary/Chest: Effort normal  and breath sounds normal. No respiratory distress. She has no wheezes. She has no rales.  Left breast mass.  Abdominal: She exhibits no distension. There is no tenderness.  BS x4 quadrants  Musculoskeletal: Normal range of motion. She exhibits tenderness. She exhibits no edema.  Pain in the right shoulder/shoulder blade, neck, middle/lower back-gait instability and frequent falling. C/o aches allover-better since Norco tid. Scoliokyphosis.   Lymphadenopathy:    She has no cervical adenopathy.  Neurological: She is alert. She has normal reflexes. No cranial nerve deficit. She exhibits normal muscle tone. Coordination normal.  Skin: Skin is warm and dry. No rash noted. She is not diaphoretic. No erythema.  Mobile, smooth, a large marble size, non-tender at left breast 7 o'clock  Psychiatric: Her mood appears not anxious. Her affect is inappropriate. Her affect is not angry, not blunt and not labile. Her speech is delayed. Her speech is not rapid and/or pressured, not tangential and not slurred. She is slowed. She is not agitated, not aggressive, not hyperactive, not  withdrawn and not actively hallucinating. Thought content is not paranoid and not delusional. Cognition and memory are impaired. She expresses impulsivity and inappropriate judgment. She does not exhibit a depressed mood. She is communicative. She exhibits abnormal recent memory. She exhibits normal remote memory. She is attentive.    Labs reviewed: Basic Metabolic Panel:  Recent Labs  16/10/96 0930 09/06/15 09/10/15  NA 132* 131* 133*  K 3.5 4.6 4.3  CL 97*  --   --   CO2 26  --   --   GLUCOSE 103*  --   --   BUN 18 19 25*  CREATININE 0.51 0.7 0.6  CALCIUM 9.2  --   --     Liver Function Tests:  Recent Labs  08/29/15 08/29/15 0930  AST 18 24  ALT 14 18  ALKPHOS 37 43  BILITOT  --  0.7  PROT  --  6.9  ALBUMIN  --  3.6    CBC:  Recent Labs  08/29/15 08/29/15 0930  WBC 8.2 7.2  NEUTROABS  --  5.8  HGB 12.6 11.8*  HCT 36 36.4  MCV  --  92.2  PLT 144* 153    Lab Results  Component Value Date   TSH 0.90 10/02/2014   Lab Results  Component Value Date   HGBA1C 5.9 01/17/2013   Lab Results  Component Value Date   CHOL 181 01/07/2010   HDL 93.60 01/07/2010   LDLCALC 78 01/07/2010   TRIG 49.0 01/07/2010   CHOLHDL 2 01/07/2010    Significant Diagnostic Results since last visit: none  Patient Care Team: Kimber Relic, MD as PCP - General (Internal Medicine) Laurey Morale, MD as Consulting Physician (Cardiology)  Assessment/Plan Problem List Items Addressed This Visit    Dementia, vascular - Primary (Chronic)    SNF for care. Continue Aricept and Namenda. MMSE 22/30 10/26/14, 21/30 09/25/15      Constipation (Chronic)    Stable. Continue Senna S daily, MiraLax prn.       Hypertension (Chronic)    Controlled, continue Amlodipine 5mg  and Metoprolol 50mg  bid      Depression with anxiety (Chronic)    Mood has been stabilized since off Mirtazapine and started Cymbalta 30mg  in setting of aches and pains 03/2015      OSTEOARTHROSIS, GENERALIZED,  MULTIPLE SITES    Pain is better controlled, chronic mid back pain in thoracic spine area,  continue Norco  GERD (gastroesophageal reflux disease)    Stable, off PPI      Hyponatremia    Last serum Na 133 09/10/15      Adult failure to thrive    Continue supportive care. #117 Dec 2015, #102 Dec 2016          Family/ staff Communication: observe for constipation  Labs/tests ordered: none  Chipper Oman NP Geriatrics [facility] [address], [place] [address] On Call:  [phone] & follow prompts after 5pm & weekends Office Phone:  [phone] Office Fax:  [phone]

## 2016-01-01 NOTE — Assessment & Plan Note (Signed)
Stable, off PPI  

## 2016-01-01 NOTE — Assessment & Plan Note (Signed)
SNF for care. Continue Aricept and Namenda. MMSE 22/30 10/26/14, 21/30 09/25/15 

## 2016-01-01 NOTE — Assessment & Plan Note (Signed)
Stable. Continue Senna S daily, MiraLax prn.  

## 2016-01-01 NOTE — Assessment & Plan Note (Signed)
Continue supportive care. #117 Dec 2015, #102 Dec 2016 

## 2016-01-01 NOTE — Assessment & Plan Note (Signed)
Controlled, continue Amlodipine 5mg and Metoprolol 50mg bid. 

## 2016-01-27 ENCOUNTER — Encounter: Payer: Self-pay | Admitting: Nurse Practitioner

## 2016-01-27 ENCOUNTER — Non-Acute Institutional Stay (SKILLED_NURSING_FACILITY): Payer: Medicare Other | Admitting: Nurse Practitioner

## 2016-01-27 DIAGNOSIS — R627 Adult failure to thrive: Secondary | ICD-10-CM | POA: Diagnosis not present

## 2016-01-27 DIAGNOSIS — I10 Essential (primary) hypertension: Secondary | ICD-10-CM

## 2016-01-27 DIAGNOSIS — F0150 Vascular dementia without behavioral disturbance: Secondary | ICD-10-CM

## 2016-01-27 DIAGNOSIS — F418 Other specified anxiety disorders: Secondary | ICD-10-CM

## 2016-01-27 DIAGNOSIS — M159 Polyosteoarthritis, unspecified: Secondary | ICD-10-CM

## 2016-01-27 DIAGNOSIS — K5900 Constipation, unspecified: Secondary | ICD-10-CM

## 2016-01-27 DIAGNOSIS — E871 Hypo-osmolality and hyponatremia: Secondary | ICD-10-CM

## 2016-01-27 DIAGNOSIS — K219 Gastro-esophageal reflux disease without esophagitis: Secondary | ICD-10-CM | POA: Diagnosis not present

## 2016-01-27 NOTE — Assessment & Plan Note (Signed)
Stable, off PPI  

## 2016-01-27 NOTE — Assessment & Plan Note (Signed)
Last serum Na 133 09/10/15, update CMP

## 2016-01-27 NOTE — Assessment & Plan Note (Signed)
Controlled, continue Amlodipine 5mg and Metoprolol 50mg bid. 

## 2016-01-27 NOTE — Assessment & Plan Note (Signed)
Mood has been stabilized since off Mirtazapine and started Cymbalta 30mg  in setting of aches and pains 03/2015 Mood is stable.

## 2016-01-27 NOTE — Assessment & Plan Note (Signed)
Stable. Continue Senna S daily, MiraLax prn.  

## 2016-01-27 NOTE — Assessment & Plan Note (Signed)
Continue supportive care. Gradual declining. Update CBC, CMP, TSH

## 2016-01-27 NOTE — Assessment & Plan Note (Signed)
Pain is better controlled, chronic mid back pain in thoracic spine area,  continue Norco 

## 2016-01-27 NOTE — Progress Notes (Signed)
Patient ID: Jamie [name], female   DOB: [birth year], 82 y.o.   MRN: [id number]  Location:  [facility] Room Number: 22 Place of Service:  SNF (31) Provider: Arna Snipe Mast NP  GREEN, [name], MD  Patient Care Team: Kimber Relic, MD as PCP - General (Internal Medicine) Laurey Morale, MD as Consulting Physician (Cardiology)  Extended Emergency Contact Information Primary Emergency Contact: Albor,Charles Address: [address]          [place], [place] [address] Darden Amber of [place] Home Phone: [phone] Relation: Son Secondary Emergency Contact: Stuart,Cleleste Address: [address]          [place], [place] [address] D[place] Amber of [place] Home Phone: [phone] Mobile Phone: [phone] Relation: Daughter  Code Status:  DNR Goals of care: Advanced Directive information Advanced Directives 01/27/2016  Does patient have an advance directive? Yes  Type of Estate agent of [place];Out of facility DNR (pink MOST or yellow form)  Does patient want to make changes to advanced directive? No - Patient declined  Copy of advanced directive(s) in chart? Yes     Chief Complaint  Patient presents with  . Medical Management of Chronic Issues    Routine Visit    HPI:  Pt is a 82 y.o. female seen today for medical management of chronic diseases.  back pain, chronic, controlled on Norco qid and Tylenol prn, HTN controlled on Amlodipine  and Metoprolol  bid, GERD asymptomatic, off acid reducer, depression managed with Cymbalta , weight loss is gradual, constipation controlled on MiraLax prn and Senokot S daily, dementia, last MMSE 21/30 09/25/15, taking Aricept and Namenda to preserve memory, she is functioning well in SNF    Past Medical History  Diagnosis Date  . Osteoporosis   . Allergic rhinitis   . Dementia   . Osteoarthritis     hands  . IBS (irritable bowel syndrome)   . GERD (gastroesophageal reflux disease)     . Hypertension   . Palpitation     a. PAC's & PVC's by Holter - 2011  . Tortuous colon 2002  . Descending thoracic aortic dissection (HCC) 04/24/2010    a. MRA 2011 - begins distal to left subclavian and extends throughout the full length of the Ao.  Marland Kitchen Aortic insufficiency 04/16/2010    a. Echo 2011 - mild to moderate AI, EF 60%.  . Anemia, unspecified 01/12/2013  . Other abnormal blood chemistry 01/12/2013  . Lumbago 10/06/2012  . Personal history of fall 10/06/2012  . Hyposmolality and/or hyponatremia 09/29/2012  . Otitis externa 09/29/2012  . Impacted cerumen 09/29/2012  . Unspecified conductive hearing loss 09/29/2012  . External hemorrhoids without mention of complication 09/29/2012  . Unspecified venous (peripheral) insufficiency 09/29/2012  . Unspecified constipation 09/29/2012  . Abnormality of gait 09/29/2012  . Congestive heart failure, unspecified 06/30/2003  . Failure to thrive in adult 04/01/2015   Past Surgical History  Procedure Laterality Date  . Dilation and curettage of uterus  1960  . Cataract extraction w/ intraocular lens implant      Allergies  Allergen Reactions  . Alendronate Sodium     REACTION: GI  . Fosamax [Alendronate Sodium]   . Influenza Vaccines     Per [facility]      Medication List       This list is accurate as of: 01/27/16 12:25 PM.  Always use your most recent med list.  acetaminophen 325 MG tablet  Commonly known as:  TYLENOL  Take 650 mg by mouth every 6 (six) hours as needed.     amLODipine 5 MG tablet  Commonly known as:  NORVASC  Take 5 mg by mouth daily.     donepezil 10 MG tablet  Commonly known as:  ARICEPT  Take 1 tablet (10 mg total) by mouth daily.     DULoxetine 30 MG capsule  Commonly known as:  CYMBALTA  Take 30 mg by mouth daily.     HYDROcodone-acetaminophen 5-325 MG tablet  Commonly known as:  NORCO/VICODIN  Take 1 tablet by mouth 4 (four) times daily.     memantine 10 MG tablet   Commonly known as:  NAMENDA  Take 10 mg by mouth 2 (two) times daily.     metoprolol 50 MG tablet  Commonly known as:  LOPRESSOR  Take 1 tablet (50 mg total) by mouth 2 (two) times daily.     polyethylene glycol packet  Commonly known as:  MIRALAX / GLYCOLAX  Take 17 g by mouth daily as needed.     RESOURCE PO  Take 120 mLs by mouth 3 (three) times daily. With med pass.     senna-docusate 8.6-50 MG tablet  Commonly known as:  Senokot-S  Take 1 tablet by mouth daily.     TEARS NATURALE II OP  Apply 1 drop to eye daily. Both eyes twice daily        Review of Systems  Constitutional: Negative for fever, chills and diaphoresis.  HENT: Positive for hearing loss. Negative for congestion, ear discharge, ear pain and tinnitus.   Eyes: Negative for photophobia, pain, discharge and redness.  Respiratory: Negative for cough, shortness of breath and wheezing.   Cardiovascular: Negative for chest pain, palpitations and leg swelling.  Gastrointestinal: Negative for nausea, vomiting, abdominal pain and constipation.  Genitourinary: Positive for frequency. Negative for dysuria, urgency and flank pain.  Musculoskeletal: Positive for back pain. Negative for myalgias and neck pain.  Skin: Negative for rash.       Left breast mass  Neurological: Negative for dizziness, tremors, seizures, weakness and headaches.  Psychiatric/Behavioral: Negative for suicidal ideas and hallucinations. The patient is not nervous/anxious.        09/25/15 MMSE 21/30    Immunization History  Administered Date(s) Administered  . Influenza Whole 09/02/2005, 09/05/2007, 09/28/2012  . PPD Test 10/15/2013  . Pneumococcal Polysaccharide-23 11/24/2003  . Td 09/29/2002   Pertinent  Health Maintenance Due  Topic Date Due  . PNA vac Low Risk Adult (2 of 2 - PCV13) 11/23/2004  . INFLUENZA VACCINE  06/24/2015  . DEXA SCAN  Completed   Fall Risk  08/19/2015 04/01/2015 08/31/2013  Falls in the past year? No No Yes   Number falls in past yr: - - 2 or more  Risk for fall due to : - Impaired mobility;Impaired balance/gait Impaired balance/gait   Functional Status Survey:    Filed Vitals:   01/27/16 0847  BP: 126/72  Pulse: 76  Temp: 97.6 F (36.4 C)  TempSrc: Oral  Resp: 20  Height:  (1.651 m)  Weight: 104 lb 14.4 oz (47.582 kg)   Body mass index is 17.46 kg/(m^2). Physical Exam  Constitutional: She appears well-developed and well-nourished. No distress.  HENT:  Head: Normocephalic and atraumatic.  Nose: Nose normal.  Mouth/Throat: Oropharynx is clear and moist. No oropharyngeal exudate.  Eyes: Conjunctivae and EOM are normal. Pupils are equal, round, and reactive to  light. Right eye exhibits no discharge. Left eye exhibits no discharge. No scleral icterus.  Neck: Normal range of motion. Neck supple. No JVD present. No thyromegaly present.  Cardiovascular: Normal rate and regular rhythm.   Murmur heard. Systolic murmur appreciated at the left sternal border 2/6  Pulmonary/Chest: Effort normal and breath sounds normal. No respiratory distress. She has no wheezes. She has no rales.  Left breast mass.  Abdominal: She exhibits no distension. There is no tenderness.  BS x4 quadrants  Musculoskeletal: Normal range of motion. She exhibits tenderness. She exhibits no edema.  Pain in the right shoulder/shoulder blade, neck, middle/lower back-gait instability and frequent falling. C/o aches allover-better since Norco tid. Scoliokyphosis.   Lymphadenopathy:    She has no cervical adenopathy.  Neurological: She is alert. She has normal reflexes. No cranial nerve deficit. She exhibits normal muscle tone. Coordination normal.  Skin: Skin is warm and dry. No rash noted. She is not diaphoretic. No erythema.  Mobile, smooth, a large marble size, non-tender at left breast 7 o'clock  Psychiatric: Her mood appears not anxious. Her affect is inappropriate. Her affect is not angry, not blunt and not  labile. Her speech is delayed. Her speech is not rapid and/or pressured, not tangential and not slurred. She is slowed. She is not agitated, not aggressive, not hyperactive, not withdrawn and not actively hallucinating. Thought content is not paranoid and not delusional. Cognition and memory are impaired. She expresses impulsivity and inappropriate judgment. She does not exhibit a depressed mood. She is communicative. She exhibits abnormal recent memory. She exhibits normal remote memory. She is attentive.    Labs reviewed:  Recent Labs  08/29/15 0930 09/06/15 09/10/15  NA 132* 131* 133*  K 3.5 4.6 4.3  CL 97*  --   --   CO2 26  --   --   GLUCOSE 103*  --   --   BUN 18 19 25*  CREATININE 0.51 0.7 0.6  CALCIUM 9.2  --   --     Recent Labs  08/29/15 08/29/15 0930  AST 18 24  ALT 14 18  ALKPHOS 37 43  BILITOT  --  0.7  PROT  --  6.9  ALBUMIN  --  3.6    Recent Labs  08/29/15 08/29/15 0930  WBC 8.2 7.2  NEUTROABS  --  5.8  HGB 12.6 11.8*  HCT 36 36.4  MCV  --  92.2  PLT 144* 153   Lab Results  Component Value Date   TSH 0.90 10/02/2014   Lab Results  Component Value Date   HGBA1C 5.9 01/17/2013   Lab Results  Component Value Date   CHOL 181 01/07/2010   HDL 93.60 01/07/2010   LDLCALC 78 01/07/2010   TRIG 49.0 01/07/2010   CHOLHDL 2 01/07/2010    Significant Diagnostic Results in last 30 days:  No results found.  Assessment/Plan  Dementia, vascular SNF for care. Continue Aricept and Namenda. MMSE 22/30 10/26/14, 21/30 11/2/1  Constipation Stable. Continue Senna S daily, MiraLax prn.   Hypertension Controlled, continue Amlodipine 5mg  and Metoprolol 50mg  bid  Depression with anxiety Mood has been stabilized since off Mirtazapine and started Cymbalta 30mg  in setting of aches and pains 03/2015 Mood is stable.   OSTEOARTHROSIS, GENERALIZED, MULTIPLE SITES Pain is better controlled, chronic mid back pain in thoracic spine area,  continue Norco  GERD  (gastroesophageal reflux disease) Stable, off PPI  Hyponatremia Last serum Na 133 09/10/15, update CMP     Family/  staff Communication: continue SNF for care needs.   Labs/tests ordered: CBC, CMP, Hgb a1c

## 2016-01-27 NOTE — Assessment & Plan Note (Signed)
SNF for care. Continue Aricept and Namenda. MMSE 22/30 10/26/14, 21/30 11/2/1 

## 2016-01-28 DIAGNOSIS — I10 Essential (primary) hypertension: Secondary | ICD-10-CM | POA: Diagnosis not present

## 2016-01-28 DIAGNOSIS — F0150 Vascular dementia without behavioral disturbance: Secondary | ICD-10-CM | POA: Diagnosis not present

## 2016-01-28 LAB — CBC AND DIFFERENTIAL
HEMATOCRIT: 36 % (ref 36–46)
Hemoglobin: 11.6 g/dL — AB (ref 12.0–16.0)
PLATELETS: 124 10*3/uL — AB (ref 150–399)
WBC: 6 10^3/mL

## 2016-01-28 LAB — BASIC METABOLIC PANEL
BUN: 26 mg/dL — AB (ref 4–21)
CREATININE: 0.6 mg/dL (ref 0.5–1.1)
GLUCOSE: 91 mg/dL
Potassium: 3.8 mmol/L (ref 3.4–5.3)
Sodium: 140 mmol/L (ref 137–147)

## 2016-01-28 LAB — HEPATIC FUNCTION PANEL
ALT: 7 U/L (ref 7–35)
AST: 11 U/L — AB (ref 13–35)
Alkaline Phosphatase: 41 U/L (ref 25–125)
BILIRUBIN, TOTAL: 0.4 mg/dL

## 2016-01-28 LAB — TSH: TSH: 0.93 u[IU]/mL (ref 0.41–5.90)

## 2016-02-24 ENCOUNTER — Non-Acute Institutional Stay (SKILLED_NURSING_FACILITY): Payer: Medicare Other | Admitting: Nurse Practitioner

## 2016-02-24 ENCOUNTER — Encounter: Payer: Self-pay | Admitting: Nurse Practitioner

## 2016-02-24 DIAGNOSIS — E871 Hypo-osmolality and hyponatremia: Secondary | ICD-10-CM | POA: Diagnosis not present

## 2016-02-24 DIAGNOSIS — K5900 Constipation, unspecified: Secondary | ICD-10-CM

## 2016-02-24 DIAGNOSIS — F0150 Vascular dementia without behavioral disturbance: Secondary | ICD-10-CM | POA: Diagnosis not present

## 2016-02-24 DIAGNOSIS — D649 Anemia, unspecified: Secondary | ICD-10-CM | POA: Diagnosis not present

## 2016-02-24 DIAGNOSIS — K219 Gastro-esophageal reflux disease without esophagitis: Secondary | ICD-10-CM | POA: Diagnosis not present

## 2016-02-24 DIAGNOSIS — M159 Polyosteoarthritis, unspecified: Secondary | ICD-10-CM | POA: Diagnosis not present

## 2016-02-24 DIAGNOSIS — R627 Adult failure to thrive: Secondary | ICD-10-CM | POA: Diagnosis not present

## 2016-02-24 DIAGNOSIS — F418 Other specified anxiety disorders: Secondary | ICD-10-CM | POA: Diagnosis not present

## 2016-02-24 DIAGNOSIS — I10 Essential (primary) hypertension: Secondary | ICD-10-CM

## 2016-02-24 NOTE — Assessment & Plan Note (Signed)
Continue supportive care. Gradual declining. 01/28/16 albumin 3.5

## 2016-02-24 NOTE — Assessment & Plan Note (Signed)
Stable. Continue Senna S daily, MiraLax prn.  

## 2016-02-24 NOTE — Assessment & Plan Note (Signed)
Stable, off PPI  

## 2016-02-24 NOTE — Assessment & Plan Note (Signed)
Controlled, continue Amlodipine 5mg and Metoprolol 50mg bid. 

## 2016-02-24 NOTE — Assessment & Plan Note (Signed)
off Mirtazapine and started Cymbalta 30mg in setting of aches and pains 03/2015 Mood is stable.  

## 2016-02-24 NOTE — Assessment & Plan Note (Signed)
08/29/15 Na 131 09/06/15 Na 131 09/10/15 Na 133 01/28/16 Na 140

## 2016-02-24 NOTE — Assessment & Plan Note (Signed)
10/02/14 Hgb 11.9 08/29/15 Hgb 12.6 01/28/16 Hgb 11.6

## 2016-02-24 NOTE — Assessment & Plan Note (Signed)
SNF for care. Continue Aricept and Namenda. MMSE 22/30 10/26/14, 21/30 11/2/1 

## 2016-02-24 NOTE — Assessment & Plan Note (Signed)
Pain is better controlled, chronic mid back pain in thoracic spine area,  continue Norco 

## 2016-02-24 NOTE — Progress Notes (Signed)
Patient ID: Jamie [name], female   DOB: 1920-10-19, 81 y.o.   MRN: [id number]  Location:  [facility] Room Number: 22 Place of Service:  SNF (31) Provider: Arna Snipe Mast NP  [name], [name], MD  Patient Care Team: Kimber Relic, MD as PCP - General (Internal Medicine) Laurey Morale, MD as Consulting Physician (Cardiology)  Extended Emergency Contact Information Primary Emergency Contact: Zoll,Charles Address: [address]          [place], [place] [address] D[place] of [place] Home Phone: [phone] Relation: Son Secondary Emergency Contact: Stuart,Cleleste Address: [address]          [place], [place] [address] [place] of [place] Home Phone: [phone] Mobile Phone: [phone] Relation: Daughter  Code Status:  DNR Goals of care: Advanced Directive information Advanced Directives 02/24/2016  Does patient have an advance directive? Yes  Type of Estate agent of [place];Out of facility DNR (pink MOST or yellow form)  Does patient want to make changes to advanced directive? No - Patient declined  Copy of advanced directive(s) in chart? Yes     Chief Complaint  Patient presents with  . Medical Management of Chronic Issues    Routine Visit    HPI:  Pt is a 82 y.o. female seen today for medical management of chronic diseases.  Hx of back pain, chronic, controlled on Norco qid and Tylenol prn, HTN controlled on Amlodipine 5mg  and Metoprolol 50mg  bid, GERD asymptomatic, off acid reducer, depression managed with Cymbalta 30mg , weight loss is gradual, constipation controlled on MiraLax prn and Senokot S daily, dementia, last MMSE 21/30 09/25/15, taking Aricept and Namenda to preserve memory, she is functioning well in SNF    Past Medical History  Diagnosis Date  . Osteoporosis   . Allergic rhinitis   . Dementia   . Osteoarthritis     hands  . IBS (irritable bowel syndrome)   . GERD (gastroesophageal reflux  disease)   . Hypertension   . Palpitation     a. PAC's & PVC's by Holter - 2011  . Tortuous colon 2002  . Descending thoracic aortic dissection (HCC) 04/24/2010    a. MRA 2011 - begins distal to left subclavian and extends throughout the full length of the Ao.  Marland Kitchen Aortic insufficiency 04/16/2010    a. Echo 2011 - mild to moderate AI, EF 60%.  . Anemia, unspecified 01/12/2013  . Other abnormal blood chemistry 01/12/2013  . Lumbago 10/06/2012  . Personal history of fall 10/06/2012  . Hyposmolality and/or hyponatremia 09/29/2012  . Otitis externa 09/29/2012  . Impacted cerumen 09/29/2012  . Unspecified conductive hearing loss 09/29/2012  . External hemorrhoids without mention of complication 09/29/2012  . Unspecified venous (peripheral) insufficiency 09/29/2012  . Unspecified constipation 09/29/2012  . Abnormality of gait 09/29/2012  . Congestive heart failure, unspecified 06/30/2003  . Failure to thrive in adult 04/01/2015   Past Surgical History  Procedure Laterality Date  . Dilation and curettage of uterus  1960  . Cataract extraction w/ intraocular lens implant      Allergies  Allergen Reactions  . Alendronate Sodium     REACTION: GI  . Fosamax [Alendronate Sodium]   . Influenza Vaccines     Per [facility]      Medication List       This list is accurate as of: 02/24/16  1:21 PM.  Always use your most recent med list.  acetaminophen 325 MG tablet  Commonly known as:  TYLENOL  Take 650 mg by mouth every 6 (six) hours as needed.     amLODipine 5 MG tablet  Commonly known as:  NORVASC  Take 5 mg by mouth daily.     donepezil 10 MG tablet  Commonly known as:  ARICEPT  Take 1 tablet (10 mg total) by mouth daily.     DULoxetine 30 MG capsule  Commonly known as:  CYMBALTA  Take 30 mg by mouth daily.     HYDROcodone-acetaminophen 5-325 MG tablet  Commonly known as:  NORCO/VICODIN  Take 1 tablet by mouth 4 (four) times daily.     memantine 10 MG  tablet  Commonly known as:  NAMENDA  Take 10 mg by mouth 2 (two) times daily.     metoprolol 50 MG tablet  Commonly known as:  LOPRESSOR  Take 1 tablet (50 mg total) by mouth 2 (two) times daily.     polyethylene glycol packet  Commonly known as:  MIRALAX / GLYCOLAX  Take 17 g by mouth daily as needed.     RESOURCE PO  Take 120 mLs by mouth 3 (three) times daily. With med pass.     senna-docusate 8.6-50 MG tablet  Commonly known as:  Senokot-S  Take 1 tablet by mouth daily.     TEARS NATURALE II OP  Apply 1 drop to eye daily. Both eyes twice daily        Review of Systems  Constitutional: Negative for fever, chills and diaphoresis.  HENT: Positive for hearing loss. Negative for congestion, ear discharge, ear pain and tinnitus.   Eyes: Negative for photophobia, pain, discharge and redness.  Respiratory: Negative for cough, shortness of breath and wheezing.   Cardiovascular: Negative for chest pain, palpitations and leg swelling.  Gastrointestinal: Negative for nausea, vomiting, abdominal pain and constipation.  Genitourinary: Positive for frequency. Negative for dysuria, urgency and flank pain.  Musculoskeletal: Positive for back pain. Negative for myalgias and neck pain.  Skin: Negative for rash.       Left breast mass  Neurological: Negative for dizziness, tremors, seizures, weakness and headaches.  Psychiatric/Behavioral: Negative for suicidal ideas and hallucinations. The patient is not nervous/anxious.        09/25/15 MMSE 21/30    Immunization History  Administered Date(s) Administered  . Influenza Whole 09/02/2005, 09/05/2007, 09/28/2012  . PPD Test 10/15/2013  . Pneumococcal Polysaccharide-23 11/24/2003  . Td 09/29/2002   Pertinent  Health Maintenance Due  Topic Date Due  . PNA vac Low Risk Adult (2 of 2 - PCV13) 11/23/2004  . INFLUENZA VACCINE  06/23/2016  . DEXA SCAN  Completed   Fall Risk  08/19/2015 04/01/2015 08/31/2013  Falls in the past year? No No Yes    Number falls in past yr: - - 2 or more  Risk for fall due to : - Impaired mobility;Impaired balance/gait Impaired balance/gait   Functional Status Survey:    Filed Vitals:   02/24/16 1131  BP: 122/66  Pulse: 68  Temp: 97.8 F (36.6 C)  TempSrc: Oral  Resp: 20  Height:  (1.651 m)  Weight: 107 lb 9.6 oz (48.807 kg)   Body mass index is 17.91 kg/(m^2). Physical Exam  Constitutional: She appears well-developed and well-nourished. No distress.  HENT:  Head: Normocephalic and atraumatic.  Nose: Nose normal.  Mouth/Throat: Oropharynx is clear and moist. No oropharyngeal exudate.  Eyes: Conjunctivae and EOM are normal. Pupils are equal, round, and reactive  to light. Right eye exhibits no discharge. Left eye exhibits no discharge. No scleral icterus.  Neck: Normal range of motion. Neck supple. No JVD present. No thyromegaly present.  Cardiovascular: Normal rate and regular rhythm.   Murmur heard. Systolic murmur appreciated at the left sternal border 2/6  Pulmonary/Chest: Effort normal and breath sounds normal. No respiratory distress. She has no wheezes. She has no rales.  Left breast mass.  Abdominal: She exhibits no distension. There is no tenderness.  BS x4 quadrants  Musculoskeletal: Normal range of motion. She exhibits tenderness. She exhibits no edema.  Pain in the right shoulder/shoulder blade, neck, middle/lower back-gait instability and frequent falling. C/o aches allover-better since Norco tid. Scoliokyphosis.   Lymphadenopathy:    She has no cervical adenopathy.  Neurological: She is alert. She has normal reflexes. No cranial nerve deficit. She exhibits normal muscle tone. Coordination normal.  Skin: Skin is warm and dry. No rash noted. She is not diaphoretic. No erythema.  Mobile, smooth, a large marble size, non-tender at left breast 7 o'clock  Psychiatric: Her mood appears not anxious. Her affect is inappropriate. Her affect is not angry, not blunt and not labile.  Her speech is delayed. Her speech is not rapid and/or pressured, not tangential and not slurred. She is slowed. She is not agitated, not aggressive, not hyperactive, not withdrawn and not actively hallucinating. Thought content is not paranoid and not delusional. Cognition and memory are impaired. She expresses impulsivity and inappropriate judgment. She does not exhibit a depressed mood. She is communicative. She exhibits abnormal recent memory. She exhibits normal remote memory. She is attentive.    Labs reviewed:  Recent Labs  08/29/15 0930 09/06/15 09/10/15 01/28/16  NA 132* 131* 133* 140  K 3.5 4.6 4.3 3.8  CL 97*  --   --   --   CO2 26  --   --   --   GLUCOSE 103*  --   --   --   BUN 18 19 25* 26*  CREATININE 0.51 0.7 0.6 0.6  CALCIUM 9.2  --   --   --     Recent Labs  08/29/15 08/29/15 0930 01/28/16  AST 18 24 11*  ALT 14 18 7   ALKPHOS 37 43 41  BILITOT  --  0.7  --   PROT  --  6.9  --   ALBUMIN  --  3.6  --     Recent Labs  08/29/15 08/29/15 0930 01/28/16  WBC 8.2 7.2 6.0  NEUTROABS  --  5.8  --   HGB 12.6 11.8* 11.6*  HCT 36 36.4 36  MCV  --  92.2  --   PLT 144* 153 124*   Lab Results  Component Value Date   TSH 0.93 01/28/2016   Lab Results  Component Value Date   HGBA1C 5.9 01/17/2013   Lab Results  Component Value Date   CHOL 181 01/07/2010   HDL 93.60 01/07/2010   LDLCALC 78 01/07/2010   TRIG 49.0 01/07/2010   CHOLHDL 2 01/07/2010    Significant Diagnostic Results in last 30 days:  No results found.  Assessment/Plan  Adult failure to thrive Continue supportive care. Gradual declining. 01/28/16 albumin 3.5  Anemia 10/02/14 Hgb 11.9 08/29/15 Hgb 12.6 01/28/16 Hgb 11.6  Constipation Stable. Continue Senna S daily, MiraLax prn.   Dementia, vascular SNF for care. Continue Aricept and Namenda. MMSE 22/30 10/26/14, 21/30 11/2/1  Depression with anxiety off Mirtazapine and started Cymbalta 30mg  in setting of  aches and pains 03/2015 Mood is  stable.    GERD (gastroesophageal reflux disease) Stable, off PPI  Hypertension Controlled, continue Amlodipine  and Metoprolol  bid  Hyponatremia 08/29/15 Na 131 09/06/15 Na 131 09/10/15 Na 133 01/28/16 Na 140  OSTEOARTHROSIS, GENERALIZED, MULTIPLE SITES Pain is better controlled, chronic mid back pain in thoracic spine area,  continue Norco    Family/ staff Communication: continue SNF for care needs.   Labs/tests ordered: none

## 2016-03-30 ENCOUNTER — Encounter: Payer: Self-pay | Admitting: Nurse Practitioner

## 2016-03-30 ENCOUNTER — Non-Acute Institutional Stay (SKILLED_NURSING_FACILITY): Payer: Medicare Other | Admitting: Nurse Practitioner

## 2016-03-30 DIAGNOSIS — I10 Essential (primary) hypertension: Secondary | ICD-10-CM

## 2016-03-30 DIAGNOSIS — R627 Adult failure to thrive: Secondary | ICD-10-CM | POA: Diagnosis not present

## 2016-03-30 DIAGNOSIS — K219 Gastro-esophageal reflux disease without esophagitis: Secondary | ICD-10-CM | POA: Diagnosis not present

## 2016-03-30 DIAGNOSIS — D649 Anemia, unspecified: Secondary | ICD-10-CM

## 2016-03-30 DIAGNOSIS — F418 Other specified anxiety disorders: Secondary | ICD-10-CM

## 2016-03-30 DIAGNOSIS — F0150 Vascular dementia without behavioral disturbance: Secondary | ICD-10-CM | POA: Diagnosis not present

## 2016-03-30 DIAGNOSIS — K5900 Constipation, unspecified: Secondary | ICD-10-CM

## 2016-03-30 DIAGNOSIS — M159 Polyosteoarthritis, unspecified: Secondary | ICD-10-CM

## 2016-03-30 NOTE — Assessment & Plan Note (Signed)
Continue supportive care. Gradual declining. 01/28/16 albumin 3.5

## 2016-03-30 NOTE — Assessment & Plan Note (Signed)
Pain is better controlled, chronic mid back pain in thoracic spine area,  continue Norco 

## 2016-03-30 NOTE — Assessment & Plan Note (Signed)
Stable, off PPI  

## 2016-03-30 NOTE — Assessment & Plan Note (Signed)
SNF for care. Continue Aricept and Namenda. MMSE 22/30 10/26/14, 21/30 11/2/1 

## 2016-03-30 NOTE — Assessment & Plan Note (Signed)
off Mirtazapine and started Cymbalta 30mg in setting of aches and pains 03/2015 Mood is stable.  

## 2016-03-30 NOTE — Progress Notes (Signed)
Patient ID: Jamie [name], female   DOB: 05-28-1920, 82 y.o.   MRN: [id number]  Location:  [facility] Room Number: 22 Place of Service:  SNF (31) Provider: Arna Snipe Brandilynn Taormina NP  GREEN, [name], MD  Patient Care Team: Kimber Relic, MD as PCP - General (Internal Medicine) Laurey Morale, MD as Consulting Physician (Cardiology)  Extended Emergency Contact Information Primary Emergency Contact: Brunetti,Charles Address: [address]          [place], [place] [address] Darden Amber of [place] Home Phone: [phone] Relation: Son Secondary Emergency Contact: Stuart,Cleleste Address: [address]          [place], [place] [address] [place] of [place] Home Phone: [phone] Mobile Phone: [phone] Relation: Daughter  Code Status:  DNR Goals of care: Advanced Directive information Advanced Directives 03/30/2016  Does patient have an advance directive? Yes  Type of Estate agent of [place];Out of facility DNR (pink MOST or yellow form)  Does patient want to make changes to advanced directive? No - Patient declined  Copy of advanced directive(s) in chart? Yes     Chief Complaint  Patient presents with  . Medical Management of Chronic Issues    Routine Visit    HPI:  Pt is a 82 y.o. female seen today for medical management of chronic diseases.  Hx of back pain, chronic, controlled on Norco qid and Tylenol prn, HTN controlled on Amlodipine 5mg  and Metoprolol 50mg  bid, GERD asymptomatic, off acid reducer, depression managed with Cymbalta 30mg , weight loss is gradual, constipation controlled on MiraLax prn and Senokot S daily, dementia, last MMSE 21/30 09/25/15, taking Aricept and Namenda to preserve memory, she is functioning well in SNF  Past Medical History  Diagnosis Date  . Osteoporosis   . Allergic rhinitis   . Dementia   . Osteoarthritis     hands  . IBS (irritable bowel syndrome)   . GERD (gastroesophageal reflux disease)    . Hypertension   . Palpitation     a. PAC's & PVC's by Holter - 2011  . Tortuous colon 2002  . Descending thoracic aortic dissection (HCC) 04/24/2010    a. MRA 2011 - begins distal to left subclavian and extends throughout the full length of the Ao.  Marland Kitchen Aortic insufficiency 04/16/2010    a. Echo 2011 - mild to moderate AI, EF 60%.  . Anemia, unspecified 01/12/2013  . Other abnormal blood chemistry 01/12/2013  . Lumbago 10/06/2012  . Personal history of fall 10/06/2012  . Hyposmolality and/or hyponatremia 09/29/2012  . Otitis externa 09/29/2012  . Impacted cerumen 09/29/2012  . Unspecified conductive hearing loss 09/29/2012  . External hemorrhoids without mention of complication 09/29/2012  . Unspecified venous (peripheral) insufficiency 09/29/2012  . Unspecified constipation 09/29/2012  . Abnormality of gait 09/29/2012  . Congestive heart failure, unspecified 06/30/2003  . Failure to thrive in adult 04/01/2015   Past Surgical History  Procedure Laterality Date  . Dilation and curettage of uterus  1960  . Cataract extraction w/ intraocular lens implant      Allergies  Allergen Reactions  . Alendronate Sodium     REACTION: GI  . Fosamax [Alendronate Sodium]   . Influenza Vaccines     Per [facility]      Medication List       This list is accurate as of: 03/30/16 12:03 PM.  Always use your most recent med list.  acetaminophen 325 MG tablet  Commonly known as:  TYLENOL  Take 650 mg by mouth every 6 (six) hours as needed.     amLODipine 5 MG tablet  Commonly known as:  NORVASC  Take 5 mg by mouth daily.     DULoxetine 30 MG capsule  Commonly known as:  CYMBALTA  Take 30 mg by mouth daily.     HYDROcodone-acetaminophen 5-325 MG tablet  Commonly known as:  NORCO/VICODIN  Take 1 tablet by mouth 2 (two) times daily.     metoprolol 50 MG tablet  Commonly known as:  LOPRESSOR  Take 1 tablet (50 mg total) by mouth 2 (two) times daily.     NAMZARIC  28-10 MG Cp24  Generic drug:  Memantine HCl-Donepezil HCl  Take 1 capsule by mouth at bedtime.     polyethylene glycol packet  Commonly known as:  MIRALAX / GLYCOLAX  Take 17 g by mouth daily as needed.     RESOURCE PO  Take 120 mLs by mouth 3 (three) times daily. With med pass.     senna-docusate 8.6-50 MG tablet  Commonly known as:  Senokot-S  Take 1 tablet by mouth daily.     TEARS NATURALE II OP  Apply 1 drop to eye daily. Both eyes twice daily        Review of Systems  Constitutional: Negative for fever, chills and diaphoresis.  HENT: Positive for hearing loss. Negative for congestion, ear discharge, ear pain and tinnitus.   Eyes: Negative for photophobia, pain, discharge and redness.  Respiratory: Negative for cough, shortness of breath and wheezing.   Cardiovascular: Negative for chest pain, palpitations and leg swelling.  Gastrointestinal: Negative for nausea, vomiting, abdominal pain and constipation.  Genitourinary: Positive for frequency. Negative for dysuria, urgency and flank pain.  Musculoskeletal: Positive for back pain. Negative for myalgias and neck pain.  Skin: Negative for rash.       Left breast mass  Neurological: Negative for dizziness, tremors, seizures, weakness and headaches.  Psychiatric/Behavioral: Negative for suicidal ideas and hallucinations. The patient is not nervous/anxious.        09/25/15 MMSE 21/30    Immunization History  Administered Date(s) Administered  . Influenza Whole 09/02/2005, 09/05/2007, 09/28/2012  . PPD Test 10/15/2013  . Pneumococcal Polysaccharide-23 11/24/2003  . Td 09/29/2002   Pertinent  Health Maintenance Due  Topic Date Due  . PNA vac Low Risk Adult (2 of 2 - PCV13) 11/23/2004  . INFLUENZA VACCINE  06/23/2016  . DEXA SCAN  Completed   Fall Risk  08/19/2015 04/01/2015 08/31/2013  Falls in the past year? No No Yes  Number falls in past yr: - - 2 or more  Risk for fall due to : - Impaired mobility;Impaired  balance/gait Impaired balance/gait   Functional Status Survey:    Filed Vitals:   03/30/16 0855  BP: 128/68  Pulse: 74  Temp: 98.7 F (37.1 C)  TempSrc: Oral  Resp: 24  Height:  (1.651 m)  Weight: 109 lb 6.4 oz (49.624 kg)   Body mass index is 18.21 kg/(m^2). Physical Exam  Constitutional: She appears well-developed and well-nourished. No distress.  HENT:  Head: Normocephalic and atraumatic.  Nose: Nose normal.  Mouth/Throat: Oropharynx is clear and moist. No oropharyngeal exudate.  Eyes: Conjunctivae and EOM are normal. Pupils are equal, round, and reactive to light. Right eye exhibits no discharge. Left eye exhibits no discharge. No scleral icterus.  Neck: Normal range of motion. Neck supple. No JVD present.  No thyromegaly present.  Cardiovascular: Normal rate and regular rhythm.   Murmur heard. Systolic murmur appreciated at the left sternal border 2/6  Pulmonary/Chest: Effort normal and breath sounds normal. No respiratory distress. She has no wheezes. She has no rales.  Left breast mass.  Abdominal: She exhibits no distension. There is no tenderness.  BS x4 quadrants  Musculoskeletal: Normal range of motion. She exhibits tenderness. She exhibits no edema.  Pain in the right shoulder/shoulder blade, neck, middle/lower back-gait instability and frequent falling. C/o aches allover-better since Norco tid. Scoliokyphosis.   Lymphadenopathy:    She has no cervical adenopathy.  Neurological: She is alert. She has normal reflexes. No cranial nerve deficit. She exhibits normal muscle tone. Coordination normal.  Skin: Skin is warm and dry. No rash noted. She is not diaphoretic. No erythema.  Mobile, smooth, a large marble size, non-tender at left breast 7 o'clock  Psychiatric: Her mood appears not anxious. Her affect is inappropriate. Her affect is not angry, not blunt and not labile. Her speech is delayed. Her speech is not rapid and/or pressured, not tangential and not  slurred. She is slowed. She is not agitated, not aggressive, not hyperactive, not withdrawn and not actively hallucinating. Thought content is not paranoid and not delusional. Cognition and memory are impaired. She expresses impulsivity and inappropriate judgment. She does not exhibit a depressed mood. She is communicative. She exhibits abnormal recent memory. She exhibits normal remote memory. She is attentive.    Labs reviewed:  Recent Labs  08/29/15 0930 09/06/15 09/10/15 01/28/16  NA 132* 131* 133* 140  K 3.5 4.6 4.3 3.8  CL 97*  --   --   --   CO2 26  --   --   --   GLUCOSE 103*  --   --   --   BUN 18 19 25* 26*  CREATININE 0.51 0.7 0.6 0.6  CALCIUM 9.2  --   --   --     Recent Labs  08/29/15 08/29/15 0930 01/28/16  AST 18 24 11*  ALT 14 18 7   ALKPHOS 37 43 41  BILITOT  --  0.7  --   PROT  --  6.9  --   ALBUMIN  --  3.6  --     Recent Labs  08/29/15 08/29/15 0930 01/28/16  WBC 8.2 7.2 6.0  NEUTROABS  --  5.8  --   HGB 12.6 11.8* 11.6*  HCT 36 36.4 36  MCV  --  92.2  --   PLT 144* 153 124*   Lab Results  Component Value Date   TSH 0.93 01/28/2016   Lab Results  Component Value Date   HGBA1C 5.9 01/17/2013   Lab Results  Component Value Date   CHOL 181 01/07/2010   HDL 93.60 01/07/2010   LDLCALC 78 01/07/2010   TRIG 49.0 01/07/2010   CHOLHDL 2 01/07/2010    Significant Diagnostic Results in last 30 days:  No results found.  Assessment/Plan  Adult failure to thrive Continue supportive care. Gradual declining. 01/28/16 albumin 3.5   Anemia 10/02/14 Hgb 11.9 08/29/15 Hgb 12.6 01/28/16 Hgb 11.6   Constipation Stable. Continue Senna S daily, MiraLax prn.     Dementia, vascular SNF for care. Continue Aricept and Namenda. MMSE 22/30 10/26/14, 21/30 11/2/1   Depression with anxiety off Mirtazapine and started Cymbalta 30mg  in setting of aches and pains 03/2015 Mood is stable.     GERD (gastroesophageal reflux disease) Stable, off  PPI  Hypertension Controlled, continue Amlodipine 5mg  and Metoprolol 50mg  bid   OSTEOARTHROSIS, GENERALIZED, MULTIPLE SITES Pain is better controlled, chronic mid back pain in thoracic spine area,  continue Norco     Family/ staff Communication: continue SNF for care needs.   Labs/tests ordered: none

## 2016-03-30 NOTE — Assessment & Plan Note (Addendum)
Stable. Continue Senna S daily, MiraLax prn.  

## 2016-03-30 NOTE — Assessment & Plan Note (Signed)
Controlled, continue Amlodipine 5mg and Metoprolol 50mg bid. 

## 2016-03-30 NOTE — Assessment & Plan Note (Signed)
10/02/14 Hgb 11.9 08/29/15 Hgb 12.6 01/28/16 Hgb 11.6

## 2016-04-27 ENCOUNTER — Encounter: Payer: Self-pay | Admitting: Nurse Practitioner

## 2016-04-27 ENCOUNTER — Non-Acute Institutional Stay (SKILLED_NURSING_FACILITY): Payer: Medicare Other | Admitting: Nurse Practitioner

## 2016-04-27 DIAGNOSIS — K589 Irritable bowel syndrome without diarrhea: Secondary | ICD-10-CM

## 2016-04-27 DIAGNOSIS — N318 Other neuromuscular dysfunction of bladder: Secondary | ICD-10-CM | POA: Diagnosis not present

## 2016-04-27 DIAGNOSIS — F0150 Vascular dementia without behavioral disturbance: Secondary | ICD-10-CM

## 2016-04-27 DIAGNOSIS — I10 Essential (primary) hypertension: Secondary | ICD-10-CM | POA: Diagnosis not present

## 2016-04-27 DIAGNOSIS — M159 Polyosteoarthritis, unspecified: Secondary | ICD-10-CM | POA: Diagnosis not present

## 2016-04-27 DIAGNOSIS — K219 Gastro-esophageal reflux disease without esophagitis: Secondary | ICD-10-CM

## 2016-04-27 DIAGNOSIS — E871 Hypo-osmolality and hyponatremia: Secondary | ICD-10-CM

## 2016-04-27 DIAGNOSIS — J309 Allergic rhinitis, unspecified: Secondary | ICD-10-CM

## 2016-04-27 DIAGNOSIS — D649 Anemia, unspecified: Secondary | ICD-10-CM | POA: Diagnosis not present

## 2016-04-27 DIAGNOSIS — R627 Adult failure to thrive: Secondary | ICD-10-CM | POA: Diagnosis not present

## 2016-04-27 DIAGNOSIS — K5900 Constipation, unspecified: Secondary | ICD-10-CM

## 2016-04-27 DIAGNOSIS — F418 Other specified anxiety disorders: Secondary | ICD-10-CM

## 2016-04-27 NOTE — Assessment & Plan Note (Signed)
Pain is better controlled, chronic mid back pain in thoracic spine area,  continue Norco 

## 2016-04-27 NOTE — Assessment & Plan Note (Signed)
Adult brief to manage urinary incontinence 

## 2016-04-27 NOTE — Assessment & Plan Note (Signed)
10/02/14 Hgb 11.9 08/29/15 Hgb 12.6 01/28/16 Hgb 11.6

## 2016-04-27 NOTE — Assessment & Plan Note (Signed)
Stable. Continue Senna S daily, MiraLax prn.  

## 2016-04-27 NOTE — Assessment & Plan Note (Signed)
Stable, off PPI  

## 2016-04-27 NOTE — Assessment & Plan Note (Signed)
Controlled, continue Amlodipine 5mg and Metoprolol 50mg bid. 

## 2016-04-27 NOTE — Progress Notes (Signed)
Patient ID: Jamie [name], female   DOB: 05/08/1920, 82 y.o.   MRN: [id number]  Location:  [facility] Room Number: 22 Place of Service:  SNF (31) Provider: Arna Snipe Saquoia Sianez NP  GREEN, [name], MD  Patient Care Team: Kimber Relic, MD as PCP - General (Internal Medicine) Laurey Morale, MD as Consulting Physician (Cardiology)  Extended Emergency Contact Information Primary Emergency Contact: Sirek,Charles Address: [address]          [place], [place] [address] [place] of [place] Home Phone: [phone] Relation: Son Secondary Emergency Contact: Stuart,Cleleste Address: [address]          [place], [place] [address] [place] of [place] Home Phone: [phone] Mobile Phone: [phone] Relation: Daughter  Code Status:  DNR Goals of care: Advanced Directive information Advanced Directives 04/27/2016  Does patient have an advance directive? Yes  Type of Estate agent of [place];Out of facility DNR (pink MOST or yellow form)  Does patient want to make changes to advanced directive? No - Patient declined  Copy of advanced directive(s) in chart? Yes     Chief Complaint  Patient presents with  . Medical Management of Chronic Issues    HPI:  Pt is a 82 y.o. female seen today for medical management of chronic diseases.  Hx of back pain, chronic, controlled on Norco qid and Tylenol prn, HTN controlled on Amlodipine  and Metoprolol  bid, GERD asymptomatic, off acid reducer, depression managed with Cymbalta , weight loss is gradual, constipation controlled on MiraLax prn and Senokot S daily, dementia, last MMSE 21/30 09/25/15, taking Aricept and Namenda to preserve memory, she is functioning well in SNF  Past Medical History  Diagnosis Date  . Osteoporosis   . Allergic rhinitis   . Dementia   . Osteoarthritis     hands  . IBS (irritable bowel syndrome)   . GERD (gastroesophageal reflux disease)   . Hypertension    . Palpitation     a. PAC's & PVC's by Holter - 2011  . Tortuous colon 2002  . Descending thoracic aortic dissection (HCC) 04/24/2010    a. MRA 2011 - begins distal to left subclavian and extends throughout the full length of the Ao.  Marland Kitchen Aortic insufficiency 04/16/2010    a. Echo 2011 - mild to moderate AI, EF 60%.  . Anemia, unspecified 01/12/2013  . Other abnormal blood chemistry 01/12/2013  . Lumbago 10/06/2012  . Personal history of fall 10/06/2012  . Hyposmolality and/or hyponatremia 09/29/2012  . Otitis externa 09/29/2012  . Impacted cerumen 09/29/2012  . Unspecified conductive hearing loss 09/29/2012  . External hemorrhoids without mention of complication 09/29/2012  . Unspecified venous (peripheral) insufficiency 09/29/2012  . Unspecified constipation 09/29/2012  . Abnormality of gait 09/29/2012  . Congestive heart failure, unspecified 06/30/2003  . Failure to thrive in adult 04/01/2015   Past Surgical History  Procedure Laterality Date  . Dilation and curettage of uterus  1960  . Cataract extraction w/ intraocular lens implant      Allergies  Allergen Reactions  . Alendronate Sodium     REACTION: GI  . Fosamax [Alendronate Sodium]   . Influenza Vaccines     Per [facility]      Medication List       This list is accurate as of: 04/27/16  5:39 PM.  Always use your most recent med list.  acetaminophen 325 MG tablet  Commonly known as:  TYLENOL  Take 650 mg by mouth every 6 (six) hours as needed.     amLODipine 5 MG tablet  Commonly known as:  NORVASC  Take 5 mg by mouth daily.     DULoxetine 30 MG capsule  Commonly known as:  CYMBALTA  Take 30 mg by mouth daily.     HYDROcodone-acetaminophen 5-325 MG tablet  Commonly known as:  NORCO/VICODIN  Take 1 tablet by mouth 2 (two) times daily.     metoprolol 50 MG tablet  Commonly known as:  LOPRESSOR  Take 1 tablet (50 mg total) by mouth 2 (two) times daily.     NAMZARIC 28-10 MG Cp24    Generic drug:  Memantine HCl-Donepezil HCl  Take 1 capsule by mouth at bedtime.     polyethylene glycol packet  Commonly known as:  MIRALAX / GLYCOLAX  Take 17 g by mouth daily as needed.     RESOURCE PO  Take 120 mLs by mouth 3 (three) times daily. With med pass.     senna-docusate 8.6-50 MG tablet  Commonly known as:  Senokot-S  Take 1 tablet by mouth daily.     TEARS NATURALE II OP  Apply 1 drop to eye daily. Both eyes twice daily        Review of Systems  Constitutional: Negative for fever, chills and diaphoresis.  HENT: Positive for hearing loss. Negative for congestion, ear discharge, ear pain and tinnitus.   Eyes: Negative for photophobia, pain, discharge and redness.  Respiratory: Negative for cough, shortness of breath and wheezing.   Cardiovascular: Negative for chest pain, palpitations and leg swelling.  Gastrointestinal: Negative for nausea, vomiting, abdominal pain and constipation.  Genitourinary: Positive for frequency. Negative for dysuria, urgency and flank pain.  Musculoskeletal: Positive for back pain. Negative for myalgias and neck pain.  Skin: Negative for rash.       Left breast mass  Neurological: Negative for dizziness, tremors, seizures, weakness and headaches.  Psychiatric/Behavioral: Negative for suicidal ideas and hallucinations. The patient is not nervous/anxious.        09/25/15 MMSE 21/30    Immunization History  Administered Date(s) Administered  . Influenza Whole 09/02/2005, 09/05/2007, 09/28/2012  . PPD Test 10/15/2013  . Pneumococcal Polysaccharide-23 11/24/2003  . Td 09/29/2002   Pertinent  Health Maintenance Due  Topic Date Due  . PNA vac Low Risk Adult (2 of 2 - PCV13) 11/23/2004  . INFLUENZA VACCINE  06/23/2016  . DEXA SCAN  Completed   Fall Risk  08/19/2015 04/01/2015 08/31/2013  Falls in the past year? No No Yes  Number falls in past yr: - - 2 or more  Risk for fall due to : - Impaired mobility;Impaired balance/gait Impaired  balance/gait   Functional Status Survey:    Filed Vitals:   04/27/16 1136  BP: 140/92  Pulse: 55  Temp: 98.8 F (37.1 C)  TempSrc: Oral  Resp: 18  Height: 5\' 5"  (1.651 m)  Weight: 108 lb 12.8 oz (49.351 kg)   Body mass index is 18.11 kg/(m^2). Physical Exam  Constitutional: She appears well-developed and well-nourished. No distress.  HENT:  Head: Normocephalic and atraumatic.  Nose: Nose normal.  Mouth/Throat: Oropharynx is clear and moist. No oropharyngeal exudate.  Eyes: Conjunctivae and EOM are normal. Pupils are equal, round, and reactive to light. Right eye exhibits no discharge. Left eye exhibits no discharge. No scleral icterus.  Neck: Normal range of motion. Neck supple. No JVD  present. No thyromegaly present.  Cardiovascular: Normal rate and regular rhythm.   Murmur heard. Systolic murmur appreciated at the left sternal border 2/6  Pulmonary/Chest: Effort normal and breath sounds normal. No respiratory distress. She has no wheezes. She has no rales.  Left breast mass.  Abdominal: She exhibits no distension. There is no tenderness.  BS x4 quadrants  Musculoskeletal: Normal range of motion. She exhibits tenderness. She exhibits no edema.  Pain in the right shoulder/shoulder blade, neck, middle/lower back-gait instability and frequent falling. C/o aches allover-better since Norco tid. Scoliokyphosis.   Lymphadenopathy:    She has no cervical adenopathy.  Neurological: She is alert. She has normal reflexes. No cranial nerve deficit. She exhibits normal muscle tone. Coordination normal.  Skin: Skin is warm and dry. No rash noted. She is not diaphoretic. No erythema.  Mobile, smooth, a large marble size, non-tender at left breast 7 o'clock  Psychiatric: Her mood appears not anxious. Her affect is inappropriate. Her affect is not angry, not blunt and not labile. Her speech is delayed. Her speech is not rapid and/or pressured, not tangential and not slurred. She is slowed. She  is not agitated, not aggressive, not hyperactive, not withdrawn and not actively hallucinating. Thought content is not paranoid and not delusional. Cognition and memory are impaired. She expresses impulsivity and inappropriate judgment. She does not exhibit a depressed mood. She is communicative. She exhibits abnormal recent memory. She exhibits normal remote memory. She is attentive.    Labs reviewed:  Recent Labs  08/29/15 0930 09/06/15 09/10/15 01/28/16  NA 132* 131* 133* 140  K 3.5 4.6 4.3 3.8  CL 97*  --   --   --   CO2 26  --   --   --   GLUCOSE 103*  --   --   --   BUN 18 19 25* 26*  CREATININE 0.51 0.7 0.6 0.6  CALCIUM 9.2  --   --   --     Recent Labs  08/29/15 08/29/15 0930 01/28/16  AST 18 24 11*  ALT 14 18 7   ALKPHOS 37 43 41  BILITOT  --  0.7  --   PROT  --  6.9  --   ALBUMIN  --  3.6  --     Recent Labs  08/29/15 08/29/15 0930 01/28/16  WBC 8.2 7.2 6.0  NEUTROABS  --  5.8  --   HGB 12.6 11.8* 11.6*  HCT 36 36.4 36  MCV  --  92.2  --   PLT 144* 153 124*   Lab Results  Component Value Date   TSH 0.93 01/28/2016   Lab Results  Component Value Date   HGBA1C 5.9 01/17/2013   Lab Results  Component Value Date   CHOL 181 01/07/2010   HDL 93.60 01/07/2010   LDLCALC 78 01/07/2010   TRIG 49.0 01/07/2010   CHOLHDL 2 01/07/2010    Significant Diagnostic Results in last 30 days:  No results found.  Assessment/Plan  Hypertension Controlled, continue Amlodipine 5mg  and Metoprolol 50mg  bid  Allergic rhinitis stable  Constipation Stable. Continue Senna S daily, MiraLax prn.     IRRITABLE BOWEL SYNDROME Stable. Continue Senna S daily, MiraLax prn.   GERD (gastroesophageal reflux disease) Stable, off PPI  Dementia, vascular SNF for care. Continue Aricept and Namenda. MMSE 22/30 10/26/14, 21/30 11/2/1  OSTEOARTHROSIS, GENERALIZED, MULTIPLE SITES Pain is better controlled, chronic mid back pain in thoracic spine area,  continue  Norco  OVERACTIVE BLADDER Adult brief  to manage urinary incontinence  Depression with anxiety off Mirtazapine and started Cymbalta 30mg  in setting of aches and pains 03/2015 Mood is stable.  Anemia 10/02/14 Hgb 11.9 08/29/15 Hgb 12.6 01/28/16 Hgb 11.6  Hyponatremia 08/29/15 Na 131 09/06/15 Na 131 09/10/15 Na 133 01/28/16 Na 140  Adult failure to thrive Continue supportive care. Gradual declining. 01/28/16 albumin 3.5    Family/ staff Communication: continue SNF for care needs.   Labs/tests ordered: none

## 2016-04-27 NOTE — Assessment & Plan Note (Signed)
SNF for care. Continue Aricept and Namenda. MMSE 22/30 10/26/14, 21/30 11/2/1 

## 2016-04-27 NOTE — Assessment & Plan Note (Signed)
off Mirtazapine and started Cymbalta 30mg in setting of aches and pains 03/2015 Mood is stable.  

## 2016-04-27 NOTE — Assessment & Plan Note (Signed)
stable °

## 2016-04-27 NOTE — Assessment & Plan Note (Signed)
08/29/15 Na 131 09/06/15 Na 131 09/10/15 Na 133 01/28/16 Na 140

## 2016-04-27 NOTE — Assessment & Plan Note (Signed)
Continue supportive care. Gradual declining. 01/28/16 albumin 3.5

## 2016-06-01 ENCOUNTER — Non-Acute Institutional Stay (SKILLED_NURSING_FACILITY): Payer: Medicare Other | Admitting: Internal Medicine

## 2016-06-01 DIAGNOSIS — I10 Essential (primary) hypertension: Secondary | ICD-10-CM

## 2016-06-01 DIAGNOSIS — D649 Anemia, unspecified: Secondary | ICD-10-CM | POA: Diagnosis not present

## 2016-06-01 DIAGNOSIS — R634 Abnormal weight loss: Secondary | ICD-10-CM

## 2016-06-01 DIAGNOSIS — F0150 Vascular dementia without behavioral disturbance: Secondary | ICD-10-CM

## 2016-06-01 DIAGNOSIS — F418 Other specified anxiety disorders: Secondary | ICD-10-CM | POA: Diagnosis not present

## 2016-06-01 DIAGNOSIS — N63 Unspecified lump in breast: Secondary | ICD-10-CM

## 2016-06-01 DIAGNOSIS — N6324 Unspecified lump in the left breast, lower inner quadrant: Secondary | ICD-10-CM

## 2016-06-01 NOTE — Progress Notes (Signed)
Patient ID: Jamie [name], female   DOB: February 19, 1920, 82 y.o.   MRN: [id number]  Location:   [facility] Room Number: N22 Place of Service:  SNF (31) Provider: Kimber Relic, MD  Patient Care Team: Kimber Relic, MD as PCP - General (Internal Medicine) Laurey Morale, MD as Consulting Physician (Cardiology) Man Johnney Ou, NP as Nurse Practitioner (Internal Medicine)  Extended Emergency Contact Information Primary Emergency Contact: Mcchesney,Charles Address: [address]          [place], [place] [address] Darden Amber of [place] Home Phone: [phone] Relation: Son Secondary Emergency Contact: Stuart,Cleleste Address: [address]          [place], [place] [address] D[place] of [place] Home Phone: [phone] Mobile Phone: [phone] Relation: Daughter  Goals of care: Advanced Directive information Advanced Directives 06/01/2016  Does patient have an advance directive? Yes  Type of Estate agent of [place];Out of facility DNR (pink MOST or yellow form)  Does patient want to make changes to advanced directive? -  Copy of advanced directive(s) in chart? Yes  Pre-existing out of facility DNR order (yellow form or pink MOST form) Yellow form placed in chart (order not valid for inpatient use)     Chief Complaint  Patient presents with  . Medical Management of Chronic Issues    medication management, routine    HPI:  Pt is a 82 y.o. female seen today for medical management of chronic diseases.    Dementia, vascular, without behavioral disturbance - unchanged  Depression with anxiety - stable on current meds  Essential hypertension - controlled  Loss of weight - regaining weight  Anemia, unspecified anemia type - stable  Left breast mass is unchanged and not tender    Past Medical History  Diagnosis Date  . Osteoporosis   . Allergic rhinitis   . Dementia   . Osteoarthritis     hands  . IBS (irritable bowel syndrome)   . GERD  (gastroesophageal reflux disease)   . Hypertension   . Palpitation     a. PAC's & PVC's by Holter - 2011  . Tortuous colon 2002  . Descending thoracic aortic dissection (HCC) 04/24/2010    a. MRA 2011 - begins distal to left subclavian and extends throughout the full length of the Ao.  Marland Kitchen Aortic insufficiency 04/16/2010    a. Echo 2011 - mild to moderate AI, EF 60%.  . Anemia, unspecified 01/12/2013  . Other abnormal blood chemistry 01/12/2013  . Lumbago 10/06/2012  . Personal history of fall 10/06/2012  . Hyposmolality and/or hyponatremia 09/29/2012  . Otitis externa 09/29/2012  . Impacted cerumen 09/29/2012  . Unspecified conductive hearing loss 09/29/2012  . External hemorrhoids without mention of complication 09/29/2012  . Unspecified venous (peripheral) insufficiency 09/29/2012  . Unspecified constipation 09/29/2012  . Abnormality of gait 09/29/2012  . Congestive heart failure, unspecified 06/30/2003  . Failure to thrive in adult 04/01/2015   Past Surgical History  Procedure Laterality Date  . Dilation and curettage of uterus  1960  . Cataract extraction w/ intraocular lens implant      Allergies  Allergen Reactions  . Alendronate Sodium     REACTION: GI  . Fosamax [Alendronate Sodium]   . Influenza Vaccines     Per [facility]      Medication List       This list is accurate as of: 06/01/16  2:55 PM.  Always use your most recent med  list.               acetaminophen 325 MG tablet  Commonly known as:  TYLENOL  Take 650 mg by mouth every 6 (six) hours as needed.     amLODipine 5 MG tablet  Commonly known as:  NORVASC  Take 5 mg by mouth daily.     DULoxetine 30 MG capsule  Commonly known as:  CYMBALTA  Take 30 mg by mouth daily.     HYDROcodone-acetaminophen 5-325 MG tablet  Commonly known as:  NORCO/VICODIN  Take 1 tablet by mouth 2 (two) times daily.     metoprolol 50 MG tablet  Commonly known as:  LOPRESSOR  Take 1 tablet (50 mg total) by mouth 2  (two) times daily.     NAMZARIC 28-10 MG Cp24  Generic drug:  Memantine HCl-Donepezil HCl  Take 1 capsule by mouth at bedtime.     polyethylene glycol packet  Commonly known as:  MIRALAX / GLYCOLAX  Take 17 g by mouth daily as needed.     RESOURCE PO  Take 120 mLs by mouth 3 (three) times daily. With med pass.     senna-docusate 8.6-50 MG tablet  Commonly known as:  Senokot-S  Take 1 tablet by mouth daily.     TEARS NATURALE II OP  Apply 1 drop to eye daily. Both eyes twice daily        Review of Systems  Constitutional: Negative for fever, chills, diaphoresis, activity change, appetite change, fatigue and unexpected weight change.  HENT: Positive for hearing loss. Negative for congestion, ear discharge, ear pain, postnasal drip, rhinorrhea, sore throat, tinnitus, trouble swallowing and voice change.   Eyes: Negative for photophobia, pain, discharge, redness, itching and visual disturbance.  Respiratory: Negative for cough, choking, shortness of breath and wheezing.   Cardiovascular: Negative for chest pain, palpitations and leg swelling.  Gastrointestinal: Negative for nausea, vomiting, abdominal pain, diarrhea, constipation and abdominal distention.  Endocrine: Negative for cold intolerance, heat intolerance, polydipsia, polyphagia and polyuria.  Genitourinary: Positive for frequency. Negative for dysuria, urgency, hematuria, flank pain, vaginal discharge, difficulty urinating and pelvic pain.  Musculoskeletal: Positive for back pain. Negative for myalgias, arthralgias, gait problem, neck pain and neck stiffness.  Skin: Negative for color change, pallor and rash.       Left breast mass  Allergic/Immunologic: Negative.   Neurological: Negative for dizziness, tremors, seizures, syncope, weakness, numbness and headaches.       Dementia  Hematological: Negative for adenopathy. Does not bruise/bleed easily.  Psychiatric/Behavioral: Positive for confusion. Negative for suicidal  ideas, hallucinations, behavioral problems, sleep disturbance, dysphoric mood and agitation. The patient is not nervous/anxious and is not hyperactive.        09/25/15 MMSE 21/30    Immunization History  Administered Date(s) Administered  . Influenza Whole 09/02/2005, 09/05/2007, 09/28/2012  . PPD Test 10/15/2013  . Pneumococcal Polysaccharide-23 11/24/2003  . Td 09/29/2002   Pertinent  Health Maintenance Due  Topic Date Due  . PNA vac Low Risk Adult (2 of 2 - PCV13) 11/23/2004  . INFLUENZA VACCINE  06/23/2016  . DEXA SCAN  Completed   Fall Risk  08/19/2015 04/01/2015 08/31/2013  Falls in the past year? No No Yes  Number falls in past yr: - - 2 or more  Risk for fall due to : - Impaired mobility;Impaired balance/gait Impaired balance/gait   Functional Status Survey:    Filed Vitals:   06/01/16 1447  BP: 124/70  Pulse: 68  Temp:  98.1 F (36.7 C)  Resp: 20  Height: 5\' 5"  (1.651 m)  Weight: 108 lb (48.988 kg)   Body mass index is 17.97 kg/(m^2). Physical Exam  Constitutional: She appears well-developed and well-nourished. No distress.  HENT:  Head: Normocephalic and atraumatic.  Nose: Nose normal.  Mouth/Throat: Oropharynx is clear and moist. No oropharyngeal exudate.  Eyes: Conjunctivae and EOM are normal. Pupils are equal, round, and reactive to light. Right eye exhibits no discharge. Left eye exhibits no discharge. No scleral icterus.  Neck: Normal range of motion. Neck supple. No JVD present. No thyromegaly present.  Cardiovascular: Normal rate and regular rhythm.   Murmur heard. Systolic murmur appreciated at the left sternal border 2/6  Pulmonary/Chest: Effort normal and breath sounds normal. No respiratory distress. She has no wheezes. She has no rales.  Left breast mass.  Abdominal: She exhibits no distension. There is no tenderness.  BS x4 quadrants  Musculoskeletal: Normal range of motion. She exhibits tenderness. She exhibits no edema.  Pain in the right  shoulder/shoulder blade, neck, middle/lower back-gait instability and frequent falling. C/o aches allover-better since Norco tid. Scoliokyphosis.   Lymphadenopathy:    She has no cervical adenopathy.  Neurological: She is alert. She has normal reflexes. No cranial nerve deficit. She exhibits normal muscle tone. Coordination normal.  Skin: Skin is warm and dry. No rash noted. She is not diaphoretic. No erythema.  Mobile, smooth, a large marble size, non-tender at left breast 7 o'clock  Psychiatric: Her mood appears not anxious. Her affect is inappropriate. Her affect is not angry, not blunt and not labile. Her speech is delayed. Her speech is not rapid and/or pressured, not tangential and not slurred. She is slowed. She is not agitated, not aggressive, not hyperactive, not withdrawn and not actively hallucinating. Thought content is not paranoid and not delusional. Cognition and memory are impaired. She expresses impulsivity and inappropriate judgment. She does not exhibit a depressed mood. She is communicative. She exhibits abnormal recent memory. She exhibits normal remote memory. She is attentive.    Labs reviewed:  Recent Labs  08/29/15 0930 09/06/15 09/10/15 01/28/16  NA 132* 131* 133* 140  K 3.5 4.6 4.3 3.8  CL 97*  --   --   --   CO2 26  --   --   --   GLUCOSE 103*  --   --   --   BUN 18 19 25* 26*  CREATININE 0.51 0.7 0.6 0.6  CALCIUM 9.2  --   --   --     Recent Labs  08/29/15 08/29/15 0930 01/28/16  AST 18 24 11*  ALT 14 18 7   ALKPHOS 37 43 41  BILITOT  --  0.7  --   PROT  --  6.9  --   ALBUMIN  --  3.6  --     Recent Labs  08/29/15 08/29/15 0930 01/28/16  WBC 8.2 7.2 6.0  NEUTROABS  --  5.8  --   HGB 12.6 11.8* 11.6*  HCT 36 36.4 36  MCV  --  92.2  --   PLT 144* 153 124*   Lab Results  Component Value Date   TSH 0.93 01/28/2016   Lab Results  Component Value Date   HGBA1C 5.9 01/17/2013   Lab Results  Component Value Date   CHOL 181 01/07/2010   HDL  93.60 01/07/2010   LDLCALC 78 01/07/2010   TRIG 49.0 01/07/2010   CHOLHDL 2 01/07/2010   Assessment/Plan  1.  Dementia, vascular, without behavioral disturbance unchanged  2. Depression with anxiety stable  3. Essential hypertension controlled  4. Loss of weight Regaining weight  5. Anemia, unspecified anemia type Follow up later this year  6. Breast lump on left side at 7 o'clock position She requests no biopsy or treatment

## 2016-06-19 ENCOUNTER — Encounter: Payer: Self-pay | Admitting: Nurse Practitioner

## 2016-06-19 ENCOUNTER — Non-Acute Institutional Stay (SKILLED_NURSING_FACILITY): Payer: Medicare Other | Admitting: Nurse Practitioner

## 2016-06-19 DIAGNOSIS — K219 Gastro-esophageal reflux disease without esophagitis: Secondary | ICD-10-CM

## 2016-06-19 DIAGNOSIS — N63 Unspecified lump in breast: Secondary | ICD-10-CM

## 2016-06-19 DIAGNOSIS — I10 Essential (primary) hypertension: Secondary | ICD-10-CM | POA: Diagnosis not present

## 2016-06-19 DIAGNOSIS — N6324 Unspecified lump in the left breast, lower inner quadrant: Secondary | ICD-10-CM

## 2016-06-19 DIAGNOSIS — F418 Other specified anxiety disorders: Secondary | ICD-10-CM

## 2016-06-19 DIAGNOSIS — D649 Anemia, unspecified: Secondary | ICD-10-CM | POA: Diagnosis not present

## 2016-06-19 DIAGNOSIS — N318 Other neuromuscular dysfunction of bladder: Secondary | ICD-10-CM | POA: Diagnosis not present

## 2016-06-19 DIAGNOSIS — K5900 Constipation, unspecified: Secondary | ICD-10-CM

## 2016-06-19 DIAGNOSIS — W19XXXA Unspecified fall, initial encounter: Secondary | ICD-10-CM | POA: Diagnosis not present

## 2016-06-19 DIAGNOSIS — F0150 Vascular dementia without behavioral disturbance: Secondary | ICD-10-CM | POA: Diagnosis not present

## 2016-06-19 NOTE — Assessment & Plan Note (Signed)
Controlled, continue Amlodipine 5mg  and Metoprolol 50mg  bid, update CMP

## 2016-06-19 NOTE — Progress Notes (Signed)
Location:   [facility] Room Number: 22 Place of Service:  SNF (31) Provider:  Mast, Man  NP   Patient Care Team: Kimber Relic, MD as PCP - General (Internal Medicine) Laurey Morale, MD as Consulting Physician (Cardiology) Man Johnney Ou, NP as Nurse Practitioner (Internal Medicine)  Extended Emergency Contact Information Primary Emergency Contact: Holzmann,Charles Address: [address]          [place], [place] [address] [place] of [place] Home Phone: [phone] Relation: Son Secondary Emergency Contact: Stuart,Cleleste Address: [address]          [place], [place] [address] [place] of [place] Home Phone: [phone] Mobile Phone: [phone] Relation: Daughter  Code Status:  DNR Goals of care: Advanced Directive information Advanced Directives 06/19/2016  Does patient have an advance directive? Yes  Type of Estate agent of [place];Out of facility DNR (pink MOST or yellow form)  Does patient want to make changes to advanced directive? No - Patient declined  Copy of advanced directive(s) in chart? Yes  Pre-existing out of facility DNR order (yellow form or pink MOST form) -     Chief Complaint  Patient presents with  . Acute Visit    Slide off the side of bed this morning.    HPI:  Pt is a 82 y.o. female seen today for an acute visit for s/p fall, no apparent injury.   Hx of back pain, chronic, controlled on Norco qid and Tylenol prn, HTN controlled on Amlodipine 5mg  and Metoprolol 50mg  bid, GERD asymptomatic, off acid reducer, depression managed with Cymbalta 30mg , weight loss is gradual, constipation controlled on MiraLax prn and Senokot S daily, dementia, last MMSE 21/30 09/25/15, taking Aricept and Namenda to preserve memory, she is functioning well in SNF   Past Medical History:  Diagnosis Date  . Abnormality of gait 09/29/2012  . Allergic rhinitis   . Anemia, unspecified 01/12/2013  . Aortic  insufficiency 04/16/2010   a. Echo 2011 - mild to moderate AI, EF 60%.  . Congestive heart failure, unspecified 06/30/2003  . Dementia   . Descending thoracic aortic dissection (HCC) 04/24/2010   a. MRA 2011 - begins distal to left subclavian and extends throughout the full length of the Ao.  Marland Kitchen External hemorrhoids without mention of complication 09/29/2012  . Failure to thrive in adult 04/01/2015  . GERD (gastroesophageal reflux disease)   . Hypertension   . Hyposmolality and/or hyponatremia 09/29/2012  . IBS (irritable bowel syndrome)   . Impacted cerumen 09/29/2012  . Lumbago 10/06/2012  . Osteoarthritis    hands  . Osteoporosis   . Other abnormal blood chemistry 01/12/2013  . Otitis externa 09/29/2012  . Palpitation    a. PAC's & PVC's by Holter - 2011  . Personal history of fall 10/06/2012  . Tortuous colon 2002  . Unspecified conductive hearing loss 09/29/2012  . Unspecified constipation 09/29/2012  . Unspecified venous (peripheral) insufficiency 09/29/2012   Past Surgical History:  Procedure Laterality Date  . CATARACT EXTRACTION W/ INTRAOCULAR LENS IMPLANT    . DILATION AND CURETTAGE OF UTERUS  1960    Allergies  Allergen Reactions  . Alendronate Sodium     REACTION: GI  . Fosamax [Alendronate Sodium]   . Influenza Vaccines     Per [facility]      Medication List       Accurate as of 06/19/16  1:32 PM. Always use your most recent med list.  acetaminophen 325 MG tablet Commonly known as:  TYLENOL Take 650 mg by mouth every 6 (six) hours as needed.   amLODipine 5 MG tablet Commonly known as:  NORVASC Take 5 mg by mouth daily.   DULoxetine 30 MG capsule Commonly known as:  CYMBALTA Take 30 mg by mouth daily.   feeding supplement Liqd Take 1 Container by mouth 3 (three) times daily between meals. Take  120 ml's by mouth 3 times daily with med pass.   HYDROcodone-acetaminophen 5-325 MG tablet Commonly known as:  NORCO/VICODIN Take 1 tablet  by mouth 2 (two) times daily.   metoprolol 50 MG tablet Commonly known as:  LOPRESSOR Take 1 tablet (50 mg total) by mouth 2 (two) times daily.   NAMZARIC 28-10 MG Cp24 Generic drug:  Memantine HCl-Donepezil HCl Take 1 capsule by mouth at bedtime.   polyethylene glycol packet Commonly known as:  MIRALAX / GLYCOLAX Take 17 g by mouth daily as needed.   senna-docusate 8.6-50 MG tablet Commonly known as:  Senokot-S Take 1 tablet by mouth daily.   TEARS NATURALE II OP Apply 1 drop to eye daily. Both eyes twice daily       Review of Systems  Constitutional: Negative for activity change, appetite change, chills, diaphoresis, fatigue, fever and unexpected weight change.  HENT: Positive for hearing loss. Negative for congestion, ear discharge, ear pain, postnasal drip, rhinorrhea, sore throat, tinnitus, trouble swallowing and voice change.   Eyes: Negative for photophobia, pain, discharge, redness, itching and visual disturbance.  Respiratory: Negative for cough, choking, shortness of breath and wheezing.   Cardiovascular: Negative for chest pain, palpitations and leg swelling.  Gastrointestinal: Negative for abdominal distention, abdominal pain, constipation, diarrhea, nausea and vomiting.  Endocrine: Negative for cold intolerance, heat intolerance, polydipsia, polyphagia and polyuria.  Genitourinary: Positive for frequency. Negative for difficulty urinating, dysuria, flank pain, hematuria, pelvic pain, urgency and vaginal discharge.  Musculoskeletal: Positive for back pain. Negative for arthralgias, gait problem, myalgias, neck pain and neck stiffness.  Skin: Negative for color change, pallor and rash.       Left breast mass  Allergic/Immunologic: Negative.   Neurological: Negative for dizziness, tremors, seizures, syncope, weakness, numbness and headaches.       Dementia  Hematological: Negative for adenopathy. Does not bruise/bleed easily.  Psychiatric/Behavioral: Positive for  confusion. Negative for agitation, behavioral problems, dysphoric mood, hallucinations, sleep disturbance and suicidal ideas. The patient is not nervous/anxious and is not hyperactive.        09/25/15 MMSE 21/30    Immunization History  Administered Date(s) Administered  . Influenza Whole 09/02/2005, 09/05/2007, 09/28/2012  . PPD Test 10/15/2013  . Pneumococcal Polysaccharide-23 11/24/2003  . Td 09/29/2002   Pertinent  Health Maintenance Due  Topic Date Due  . PNA vac Low Risk Adult (2 of 2 - PCV13) 11/23/2004  . INFLUENZA VACCINE  06/23/2016  . DEXA SCAN  Completed   Fall Risk  08/19/2015 04/01/2015 08/31/2013  Falls in the past year? No No Yes  Number falls in past yr: - - 2 or more  Risk for fall due to : - Impaired mobility;Impaired balance/gait Impaired balance/gait   Functional Status Survey:    Vitals:   06/19/16 1102  BP: (!) 160/88  Pulse: 64  Resp: 18  Temp: 98.5 F (36.9 C)  Weight: 108 lb (49 kg)  Height: 5\' 5"  (1.651 m)   Body mass index is 17.97 kg/m. Physical Exam  Constitutional: She appears well-developed and well-nourished. No distress.  HENT:  Head: Normocephalic and atraumatic.  Nose: Nose normal.  Mouth/Throat: Oropharynx is clear and moist. No oropharyngeal exudate.  Eyes: Conjunctivae and EOM are normal. Pupils are equal, round, and reactive to light. Right eye exhibits no discharge. Left eye exhibits no discharge. No scleral icterus.  Neck: Normal range of motion. Neck supple. No JVD present. No thyromegaly present.  Cardiovascular: Normal rate and regular rhythm.   Murmur heard. Systolic murmur appreciated at the left sternal border 2/6  Pulmonary/Chest: Effort normal and breath sounds normal. No respiratory distress. She has no wheezes. She has no rales.  Left breast mass.  Abdominal: She exhibits no distension. There is no tenderness.  BS x4 quadrants  Musculoskeletal: Normal range of motion. She exhibits tenderness. She exhibits no edema.    Pain in the right shoulder/shoulder blade, neck, middle/lower back-gait instability and frequent falling. C/o aches allover-better since Norco tid. Scoliokyphosis.   Lymphadenopathy:    She has no cervical adenopathy.  Neurological: She is alert. She has normal reflexes. No cranial nerve deficit. She exhibits normal muscle tone. Coordination normal.  Skin: Skin is warm and dry. No rash noted. She is not diaphoretic. No erythema.  Mobile, smooth, a large marble size, non-tender at left breast 7 o'clock  Psychiatric: Her mood appears not anxious. Her affect is inappropriate. Her affect is not angry, not blunt and not labile. Her speech is delayed. Her speech is not rapid and/or pressured, not tangential and not slurred. She is slowed. She is not agitated, not aggressive, not hyperactive, not withdrawn and not actively hallucinating. Thought content is not paranoid and not delusional. Cognition and memory are impaired. She expresses impulsivity and inappropriate judgment. She does not exhibit a depressed mood. She is communicative. She exhibits abnormal recent memory. She exhibits normal remote memory. She is attentive.    Labs reviewed:  Recent Labs  08/29/15 0930 09/06/15 09/10/15 01/28/16  NA 132* 131* 133* 140  K 3.5 4.6 4.3 3.8  CL 97*  --   --   --   CO2 26  --   --   --   GLUCOSE 103*  --   --   --   BUN 18 19 25* 26*  CREATININE 0.51 0.7 0.6 0.6  CALCIUM 9.2  --   --   --     Recent Labs  08/29/15 08/29/15 0930 01/28/16  AST 18 24 11*  ALT ALKPHOS 37 43 41  BILITOT  --  0.7  --   PROT  --  6.9  --   ALBUMIN  --  3.6  --     Recent Labs  08/29/15 08/29/15 0930 01/28/16  WBC 8.2 7.2 6.0  NEUTROABS  --  5.8  --   HGB 12.6 11.8* 11.6*  HCT 36 36.4 36  MCV  --  92.2  --   PLT 144* 153 124*   Lab Results  Component Value Date   TSH 0.93 01/28/2016   Lab Results  Component Value Date   HGBA1C 5.9 01/17/2013   Lab Results  Component Value Date   CHOL 181  01/07/2010   HDL 93.60 01/07/2010   LDLCALC 78 01/07/2010   TRIG 49.0 01/07/2010   CHOLHDL 2 01/07/2010    Significant Diagnostic Results in last 30 days:  No results found.  Assessment/Plan There are no diagnoses linked to this encounter. Hypertension Controlled, continue Amlodipine  and Metoprolol  bid, update CMP  Constipation Stable. Continue Senna S daily, MiraLax prn.  GERD (gastroesophageal reflux disease) Stable, off PPI   Dementia, vascular SNF for care. Continue Aricept and Namenda. MMSE 22/30 10/26/14, 21/30 11/2/1, update CBC and TSH  OVERACTIVE BLADDER Adult brief to manage urinary incontinence  Anemia 10/02/14 Hgb 11.9 08/29/15 Hgb 12.6 01/28/16 Hgb 11.6 Update CBC  Fall Lack of safety awareness and increased frailty contributory. Close supervision for safety  Depression with anxiety off Mirtazapine and started Cymbalta 30mg  in setting of aches and pains 03/2015 Mood is stable.   Breast lump on left side at 7 o'clock position 07/05/14 POA declined Mammogram. Desires no IVF. ABT for comfort measures only.       Family/ staff Communication: continue SNF for care assistance  Labs/tests ordered:  CBC, CMP, TSH

## 2016-06-19 NOTE — Assessment & Plan Note (Signed)
07/05/14 POA declined Mammogram. Desires no IVF. ABT for comfort measures only.

## 2016-06-19 NOTE — Assessment & Plan Note (Signed)
Lack of safety awareness and increased frailty contributory. Close supervision for safety

## 2016-06-19 NOTE — Assessment & Plan Note (Signed)
off Mirtazapine and started Cymbalta 30mg  in setting of aches and pains 03/2015 Mood is stable.

## 2016-06-19 NOTE — Assessment & Plan Note (Signed)
10/02/14 Hgb 11.9 08/29/15 Hgb 12.6 01/28/16 Hgb 11.6 Update CBC

## 2016-06-19 NOTE — Assessment & Plan Note (Signed)
Stable, off PPI  

## 2016-06-19 NOTE — Assessment & Plan Note (Signed)
Stable. Continue Senna S daily, MiraLax prn.  

## 2016-06-19 NOTE — Assessment & Plan Note (Signed)
Adult brief to manage urinary incontinence

## 2016-06-19 NOTE — Assessment & Plan Note (Signed)
SNF for care. Continue Aricept and Namenda. MMSE 22/30 10/26/14, 21/30 11/2/1, update CBC and TSH

## 2016-06-22 ENCOUNTER — Non-Acute Institutional Stay (SKILLED_NURSING_FACILITY): Payer: Medicare Other | Admitting: Nurse Practitioner

## 2016-06-22 ENCOUNTER — Encounter: Payer: Self-pay | Admitting: Nurse Practitioner

## 2016-06-22 DIAGNOSIS — F0150 Vascular dementia without behavioral disturbance: Secondary | ICD-10-CM | POA: Diagnosis not present

## 2016-06-22 DIAGNOSIS — F418 Other specified anxiety disorders: Secondary | ICD-10-CM

## 2016-06-22 DIAGNOSIS — M159 Polyosteoarthritis, unspecified: Secondary | ICD-10-CM

## 2016-06-22 DIAGNOSIS — K219 Gastro-esophageal reflux disease without esophagitis: Secondary | ICD-10-CM

## 2016-06-22 DIAGNOSIS — D649 Anemia, unspecified: Secondary | ICD-10-CM | POA: Diagnosis not present

## 2016-06-22 DIAGNOSIS — K5900 Constipation, unspecified: Secondary | ICD-10-CM

## 2016-06-22 DIAGNOSIS — I10 Essential (primary) hypertension: Secondary | ICD-10-CM

## 2016-06-22 NOTE — Progress Notes (Signed)
Location:  [facility] Room Number: 22 Place of Service:  SNF (31) Provider:  Caprice Wasko  NP    Patient Care Team: Kimber Relic, MD as PCP - General (Internal Medicine) Laurey Morale, MD as Consulting Physician (Cardiology) Lucero Ide Johnney Ou, NP as Nurse Practitioner (Internal Medicine)  Extended Emergency Contact Information Primary Emergency Contact: Bucklew,Charles Address: [address]          [place], [place] [address] Darden [place] of [place] Home Phone: [phone] Relation: Son Secondary Emergency Contact: Stuart,Cleleste Address: [address]          [place], [place] [address] [place] of [place] Home Phone: [phone] Mobile Phone: [phone] Relation: Daughter  Code Status:  DNR Goals of care: Advanced Directive information Advanced Directives 06/22/2016  Does patient have an advance directive? Yes  Type of Advance Directive Living will;Healthcare Power of Attorney  Does patient want to make changes to advanced directive? No - Patient declined  Copy of advanced directive(s) in chart? Yes  Pre-existing out of facility DNR order (yellow form or pink MOST form) -     Chief Complaint  Patient presents with  . Acute Visit    pain in back, needs more assistance    HPI:  Pt is a 82 y.o. female seen today for an acute visit for worsened back pain, no apparent injury, taking Norco bid.     Hx of back pain, chronic, worsened,  on Norco qid and Tylenol prn, HTN controlled on Amlodipine 5mg  and Metoprolol 50mg  bid, GERD asymptomatic, off acid reducer, depression managed with Cymbalta 30mg , weight loss is gradual, constipation controlled on MiraLax prn and Senokot S daily, dementia, last MMSE 21/30 09/25/15, taking Aricept and Namenda to preserve memory, she is functioning well in SNF  Past Medical History:  Diagnosis Date  . Abnormality of gait 09/29/2012  . Allergic rhinitis   . Anemia, unspecified 01/12/2013  . Aortic insufficiency  04/16/2010   a. Echo 2011 - mild to moderate AI, EF 60%.  . Congestive heart failure, unspecified 06/30/2003  . Dementia   . Descending thoracic aortic dissection (HCC) 04/24/2010   a. MRA 2011 - begins distal to left subclavian and extends throughout the full length of the Ao.  Marland Kitchen External hemorrhoids without mention of complication 09/29/2012  . Failure to thrive in adult 04/01/2015  . GERD (gastroesophageal reflux disease)   . Hypertension   . Hyposmolality and/or hyponatremia 09/29/2012  . IBS (irritable bowel syndrome)   . Impacted cerumen 09/29/2012  . Lumbago 10/06/2012  . Osteoarthritis    hands  . Osteoporosis   . Other abnormal blood chemistry 01/12/2013  . Otitis externa 09/29/2012  . Palpitation    a. PAC's & PVC's by Holter - 2011  . Personal history of fall 10/06/2012  . Tortuous colon 2002  . Unspecified conductive hearing loss 09/29/2012  . Unspecified constipation 09/29/2012  . Unspecified venous (peripheral) insufficiency 09/29/2012   Past Surgical History:  Procedure Laterality Date  . CATARACT EXTRACTION W/ INTRAOCULAR LENS IMPLANT    . DILATION AND CURETTAGE OF UTERUS  1960    Allergies  Allergen Reactions  . Alendronate Sodium     REACTION: GI  . Fosamax [Alendronate Sodium]   . Influenza Vaccines     Per [facility]      Medication List       Accurate as of 06/22/16 11:59 PM. Always use your most recent med list.  acetaminophen 325 MG tablet Commonly known as:  TYLENOL Take 650 mg by mouth every 6 (six) hours as needed.   amLODipine 5 MG tablet Commonly known as:  NORVASC Take 5 mg by mouth daily.   DULoxetine 30 MG capsule Commonly known as:  CYMBALTA Take 30 mg by mouth daily.   feeding supplement Liqd Take 1 Container by mouth 3 (three) times daily between meals. Take  120 ml's by mouth 3 times daily with med pass.   HYDROcodone-acetaminophen 5-325 MG tablet Commonly known as:  NORCO/VICODIN Take 1 tablet by mouth 2  (two) times daily.   metoprolol 50 MG tablet Commonly known as:  LOPRESSOR Take 1 tablet (50 mg total) by mouth 2 (two) times daily.   NAMZARIC 28-10 MG Cp24 Generic drug:  Memantine HCl-Donepezil HCl Take 1 capsule by mouth at bedtime.   polyethylene glycol packet Commonly known as:  MIRALAX / GLYCOLAX Take 17 g by mouth daily as needed.   senna-docusate 8.6-50 MG tablet Commonly known as:  Senokot-S Take 1 tablet by mouth daily.   TEARS NATURALE II OP Apply 1 drop to eye daily. Both eyes twice daily       Review of Systems  Constitutional: Negative for activity change, appetite change, chills, diaphoresis, fatigue, fever and unexpected weight change.  HENT: Positive for hearing loss. Negative for congestion, ear discharge, ear pain, postnasal drip, rhinorrhea, sore throat, tinnitus, trouble swallowing and voice change.   Eyes: Negative for photophobia, pain, discharge, redness, itching and visual disturbance.  Respiratory: Negative for cough, choking, shortness of breath and wheezing.   Cardiovascular: Negative for chest pain, palpitations and leg swelling.  Gastrointestinal: Negative for abdominal distention, abdominal pain, constipation, diarrhea, nausea and vomiting.  Endocrine: Negative for cold intolerance, heat intolerance, polydipsia, polyphagia and polyuria.  Genitourinary: Positive for frequency. Negative for difficulty urinating, dysuria, flank pain, hematuria, pelvic pain, urgency and vaginal discharge.  Musculoskeletal: Positive for back pain. Negative for arthralgias, gait problem, myalgias, neck pain and neck stiffness.  Skin: Negative for color change, pallor and rash.       Left breast mass  Allergic/Immunologic: Negative.   Neurological: Negative for dizziness, tremors, seizures, syncope, weakness, numbness and headaches.       Dementia  Hematological: Negative for adenopathy. Does not bruise/bleed easily.  Psychiatric/Behavioral: Positive for confusion.  Negative for agitation, behavioral problems, dysphoric mood, hallucinations, sleep disturbance and suicidal ideas. The patient is not nervous/anxious and is not hyperactive.        09/25/15 MMSE 21/30    Immunization History  Administered Date(s) Administered  . Influenza Whole 09/02/2005, 09/05/2007, 09/28/2012  . PPD Test 10/15/2013  . Pneumococcal Polysaccharide-23 11/24/2003  . Td 09/29/2002   Pertinent  Health Maintenance Due  Topic Date Due  . PNA vac Low Risk Adult (2 of 2 - PCV13) 11/23/2004  . INFLUENZA VACCINE  06/23/2016  . DEXA SCAN  Completed   Fall Risk  08/19/2015 04/01/2015 08/31/2013  Falls in the past year? No No Yes  Number falls in past yr: - - 2 or more  Risk for fall due to : - Impaired mobility;Impaired balance/gait Impaired balance/gait   Functional Status Survey:    Vitals:   06/22/16 1335  BP: 118/64  Pulse: 62  Resp: 20  Temp: 98.9 F (37.2 C)  Weight: 108 lb (49 kg)  Height:  (1.651 m)   Body mass index is 17.97 kg/m. Physical Exam  Constitutional: She appears well-developed and well-nourished. No distress.  HENT:  Head: Normocephalic and atraumatic.  Nose: Nose normal.  Mouth/Throat: Oropharynx is clear and moist. No oropharyngeal exudate.  Eyes: Conjunctivae and EOM are normal. Pupils are equal, round, and reactive to light. Right eye exhibits no discharge. Left eye exhibits no discharge. No scleral icterus.  Neck: Normal range of motion. Neck supple. No JVD present. No thyromegaly present.  Cardiovascular: Normal rate and regular rhythm.   Murmur heard. Systolic murmur appreciated at the left sternal border 2/6  Pulmonary/Chest: Effort normal and breath sounds normal. No respiratory distress. She has no wheezes. She has no rales.  Left breast mass.  Abdominal: She exhibits no distension. There is no tenderness.  BS x4 quadrants  Musculoskeletal: Normal range of motion. She exhibits tenderness. She exhibits no edema.  Pain in the  right shoulder/shoulder blade, neck, middle/lower back-gait instability and frequent falling. C/o aches allover-better since Norco tid. Scoliokyphosis.   Lymphadenopathy:    She has no cervical adenopathy.  Neurological: She is alert. She has normal reflexes. No cranial nerve deficit. She exhibits normal muscle tone. Coordination normal.  Skin: Skin is warm and dry. No rash noted. She is not diaphoretic. No erythema.  Mobile, smooth, a large marble size, non-tender at left breast 7 o'clock  Psychiatric: Her mood appears not anxious. Her affect is inappropriate. Her affect is not angry, not blunt and not labile. Her speech is delayed. Her speech is not rapid and/or pressured, not tangential and not slurred. She is slowed. She is not agitated, not aggressive, not hyperactive, not withdrawn and not actively hallucinating. Thought content is not paranoid and not delusional. Cognition and memory are impaired. She expresses impulsivity and inappropriate judgment. She does not exhibit a depressed mood. She is communicative. She exhibits abnormal recent memory. She exhibits normal remote memory. She is attentive.    Labs reviewed:  Recent Labs  08/29/15 0930 09/06/15 09/10/15 01/28/16  NA 132* 131* 133* 140  K 3.5 4.6 4.3 3.8  CL 97*  --   --   --   CO2 26  --   --   --   GLUCOSE 103*  --   --   --   BUN 18 19 25* 26*  CREATININE 0.51 0.7 0.6 0.6  CALCIUM 9.2  --   --   --     Recent Labs  08/29/15 08/29/15 0930 01/28/16  AST 18 24 11*  ALT 14 18 7   ALKPHOS 37 43 41  BILITOT  --  0.7  --   PROT  --  6.9  --   ALBUMIN  --  3.6  --     Recent Labs  08/29/15 08/29/15 0930 01/28/16  WBC 8.2 7.2 6.0  NEUTROABS  --  5.8  --   HGB 12.6 11.8* 11.6*  HCT 36 36.4 36  MCV  --  92.2  --   PLT 144* 153 124*   Lab Results  Component Value Date   TSH 0.93 01/28/2016   Lab Results  Component Value Date   HGBA1C 5.9 01/17/2013   Lab Results  Component Value Date   CHOL 181 01/07/2010    HDL 93.60 01/07/2010   LDLCALC 78 01/07/2010   TRIG 49.0 01/07/2010   CHOLHDL 2 01/07/2010    Significant Diagnostic Results in last 30 days:  No results found.  Assessment/Plan There are no diagnoses linked to this encounter. OSTEOARTHROSIS, GENERALIZED, MULTIPLE SITES Pain is worse, chronic mid back pain in thoracic spine area,  Increase Norco 5/325 tid, x-ray thoracic  spine, lumbar spine, pelvis.    Hypertension Controlled, continue Amlodipine 5mg  and Metoprolol 50mg  bid  Constipation Stable. Continue Senna S daily, MiraLax prn.   GERD (gastroesophageal reflux disease) Stable, off PPI  Dementia, vascular SNF for care. Continue Aricept and Namenda. MMSE 22/30 10/26/14, 21/30 11/2/1  Anemia Pending CBC, Hgb 11-12 is her baseline in the past 2 years  Depression with anxiety off Mirtazapine and started Cymbalta 30mg  in setting of aches and pains 03/2015 Mood is stable.       Family/ staff Communication: continue SNF for care assistance  Labs/tests ordered:  none

## 2016-06-23 DIAGNOSIS — N183 Chronic kidney disease, stage 3 (moderate): Secondary | ICD-10-CM | POA: Diagnosis not present

## 2016-06-23 LAB — HEPATIC FUNCTION PANEL
ALT: 9 U/L (ref 7–35)
AST: 14 U/L (ref 13–35)
Alkaline Phosphatase: 50 U/L (ref 25–125)
Bilirubin, Total: 0.5 mg/dL

## 2016-06-23 LAB — BASIC METABOLIC PANEL
BUN: 22 mg/dL — AB (ref 4–21)
CREATININE: 0.6 mg/dL (ref <=1.1)
GLUCOSE: 91 mg/dL
Potassium: 3.8 mmol/L (ref 3.4–5.3)
SODIUM: 139 mmol/L (ref 137–147)

## 2016-06-23 LAB — CBC AND DIFFERENTIAL
HEMATOCRIT: 40 % (ref 36–46)
Hemoglobin: 13.4 g/dL (ref 12.0–16.0)
PLATELETS: 135 10*3/uL — AB (ref 150–399)
WBC: 6 10^3/mL

## 2016-06-23 LAB — TSH: TSH: 1 u[IU]/mL (ref <=5.90)

## 2016-06-23 NOTE — Assessment & Plan Note (Signed)
Stable. Continue Senna S daily, MiraLax prn.

## 2016-06-23 NOTE — Assessment & Plan Note (Signed)
Pending CBC, Hgb 11-12 is her baseline in the past 2 years

## 2016-06-23 NOTE — Assessment & Plan Note (Signed)
SNF for care. Continue Aricept and Namenda. MMSE 22/30 10/26/14, 21/30 11/2/1

## 2016-06-23 NOTE — Assessment & Plan Note (Signed)
Controlled, continue Amlodipine 5mg and Metoprolol 50mg bid. 

## 2016-06-23 NOTE — Assessment & Plan Note (Signed)
Stable, off PPI  

## 2016-06-23 NOTE — Assessment & Plan Note (Signed)
Pain is worse, chronic mid back pain in thoracic spine area,  Increase Norco 5/325 tid, x-ray thoracic spine, lumbar spine, pelvis.

## 2016-06-23 NOTE — Assessment & Plan Note (Signed)
off Mirtazapine and started Cymbalta 30mg in setting of aches and pains 03/2015 Mood is stable.  

## 2016-06-24 DIAGNOSIS — M546 Pain in thoracic spine: Secondary | ICD-10-CM | POA: Diagnosis not present

## 2016-06-24 DIAGNOSIS — M545 Low back pain: Secondary | ICD-10-CM | POA: Diagnosis not present

## 2016-06-24 DIAGNOSIS — M25559 Pain in unspecified hip: Secondary | ICD-10-CM | POA: Diagnosis not present

## 2016-06-26 ENCOUNTER — Other Ambulatory Visit: Payer: Self-pay | Admitting: *Deleted

## 2016-06-28 ENCOUNTER — Encounter (HOSPITAL_COMMUNITY): Payer: Self-pay | Admitting: *Deleted

## 2016-06-28 ENCOUNTER — Emergency Department (HOSPITAL_COMMUNITY): Payer: Medicare Other

## 2016-06-28 ENCOUNTER — Emergency Department (HOSPITAL_COMMUNITY)
Admission: EM | Admit: 2016-06-28 | Discharge: 2016-06-28 | Disposition: A | Discharge status: Home / Self Care | Payer: Medicare Other | Attending: Emergency Medicine | Admitting: Emergency Medicine

## 2016-06-28 DIAGNOSIS — F0390 Unspecified dementia without behavioral disturbance: Secondary | ICD-10-CM | POA: Diagnosis not present

## 2016-06-28 DIAGNOSIS — I509 Heart failure, unspecified: Secondary | ICD-10-CM | POA: Insufficient documentation

## 2016-06-28 DIAGNOSIS — R140 Abdominal distension (gaseous): Secondary | ICD-10-CM | POA: Diagnosis not present

## 2016-06-28 DIAGNOSIS — R1032 Left lower quadrant pain: Secondary | ICD-10-CM | POA: Diagnosis present

## 2016-06-28 DIAGNOSIS — Z79899 Other long term (current) drug therapy: Secondary | ICD-10-CM | POA: Insufficient documentation

## 2016-06-28 DIAGNOSIS — I110 Hypertensive heart disease with heart failure: Secondary | ICD-10-CM | POA: Insufficient documentation

## 2016-06-28 DIAGNOSIS — K5900 Constipation, unspecified: Secondary | ICD-10-CM | POA: Diagnosis not present

## 2016-06-28 DIAGNOSIS — R05 Cough: Secondary | ICD-10-CM | POA: Diagnosis not present

## 2016-06-28 DIAGNOSIS — K529 Noninfective gastroenteritis and colitis, unspecified: Secondary | ICD-10-CM | POA: Diagnosis not present

## 2016-06-28 DIAGNOSIS — R031 Nonspecific low blood-pressure reading: Secondary | ICD-10-CM | POA: Diagnosis not present

## 2016-06-28 LAB — CBC WITH DIFFERENTIAL/PLATELET
BASOS ABS: 0 10*3/uL (ref 0.0–0.1)
BASOS PCT: 1 %
EOS ABS: 0.2 10*3/uL (ref 0.0–0.7)
Eosinophils Relative: 3 %
HEMATOCRIT: 39.5 % (ref 36.0–46.0)
Hemoglobin: 12.5 g/dL (ref 12.0–15.0)
Lymphocytes Relative: 17 %
Lymphs Abs: 1.1 10*3/uL (ref 0.7–4.0)
MCH: 29.3 pg (ref 26.0–34.0)
MCHC: 31.6 g/dL (ref 30.0–36.0)
MCV: 92.7 fL (ref 78.0–100.0)
MONO ABS: 0.7 10*3/uL (ref 0.1–1.0)
Monocytes Relative: 11 %
NEUTROS ABS: 4.6 10*3/uL (ref 1.7–7.7)
NEUTROS PCT: 70 %
Platelets: 168 10*3/uL (ref 150–400)
RBC: 4.26 MIL/uL (ref 3.87–5.11)
RDW: 13.4 % (ref 11.5–15.5)
WBC: 6.5 10*3/uL (ref 4.0–10.5)

## 2016-06-28 LAB — URINALYSIS, ROUTINE W REFLEX MICROSCOPIC
BILIRUBIN URINE: NEGATIVE
Glucose, UA: NEGATIVE mg/dL
Hgb urine dipstick: NEGATIVE
KETONES UR: NEGATIVE mg/dL
LEUKOCYTES UA: NEGATIVE
NITRITE: NEGATIVE
PROTEIN: 100 mg/dL — AB
Specific Gravity, Urine: 1.031 — ABNORMAL HIGH (ref 1.005–1.030)
pH: 5.5 (ref 5.0–8.0)

## 2016-06-28 LAB — COMPREHENSIVE METABOLIC PANEL
ALBUMIN: 3.6 g/dL (ref 3.5–5.0)
ALT: 10 U/L — ABNORMAL LOW (ref 14–54)
ANION GAP: 7 (ref 5–15)
AST: 14 U/L — AB (ref 15–41)
Alkaline Phosphatase: 62 U/L (ref 38–126)
BILIRUBIN TOTAL: 0.4 mg/dL (ref 0.3–1.2)
BUN: 32 mg/dL — ABNORMAL HIGH (ref 6–20)
CALCIUM: 9.2 mg/dL (ref 8.9–10.3)
CO2: 32 mmol/L (ref 22–32)
Chloride: 99 mmol/L — ABNORMAL LOW (ref 101–111)
Creatinine, Ser: 0.66 mg/dL (ref 0.44–1.00)
GFR calc Af Amer: >60 mL/min (ref >60)
GFR calc non Af Amer: >60 mL/min (ref >60)
Glucose, Bld: 96 mg/dL (ref 65–99)
POTASSIUM: 4.2 mmol/L (ref 3.5–5.1)
SODIUM: 138 mmol/L (ref 135–145)
TOTAL PROTEIN: 7.3 g/dL (ref 6.5–8.1)

## 2016-06-28 LAB — URINE MICROSCOPIC-ADD ON

## 2016-06-28 LAB — LIPASE, BLOOD: Lipase: 20 U/L (ref 11–51)

## 2016-06-28 MED ORDER — IOPAMIDOL (ISOVUE-300) INJECTION 61%
80.0000 mL | Freq: Once | INTRAVENOUS | Status: AC | PRN
Start: 2016-06-28 — End: 2016-06-28
  Administered 2016-06-28: 80 mL via INTRAVENOUS

## 2016-06-28 MED ORDER — SIMETHICONE 40 MG/0.6ML PO SUSP (UNIT DOSE)
40.0000 mg | Freq: Once | ORAL | Status: AC
  Administered 2016-06-28: 40 mg via ORAL
  Filled 2016-06-28: qty 0.6

## 2016-06-28 MED ORDER — SIMETHICONE 40 MG/0.6ML PO SUSP: 40.0000 mg | Freq: Four times a day (QID) | ORAL | 0 refills | Status: AC | PRN

## 2016-06-28 NOTE — ED Notes (Signed)
Bed: [id number] Expected date:  Expected time:  Means of arrival:  Comments: 82 yo constipation

## 2016-06-28 NOTE — ED Notes (Signed)
[facility] called for transport.  

## 2016-06-28 NOTE — ED Triage Notes (Signed)
Patient poor historian brought in by EMS [facility] complaining of constipation x a few days.

## 2016-06-28 NOTE — ED Notes (Signed)
[facility] here to transport pt back to [facility]. 

## 2016-06-28 NOTE — ED Notes (Signed)
Report called Obas, LPN at  [facility]. Patient awaiting on [facility] transport

## 2016-06-28 NOTE — ED Provider Notes (Signed)
[facility]-EMERGENCY DEPT Provider Note   CSN: [id number] Arrival date & time: 06/28/16  1328  First Provider Contact:  None       History   Chief Complaint Chief Complaint  Patient presents with  . Constipation    HPI MALAYAH [name] is a 82 y.o. female.  HPI Level V caveat, dementia. 82 year old female with a history of hypertension, dementia, aortic insufficiency presents with concern of abdominal distention, pain, decreased bowel movements.  Daughter reports noting abdominal distention beginning last week. Patient reports inability to have bowel movements, trying prune juice and other things at the facility, and having no relief. Reports she had a very small bowel movement last night and this morning, however is continue have distention and pain.  Daughter denies fevers, unknown if patient has had vomiting or decreased appetite.  Daughter reports she is frequently saying she needs to use the bathroom and there is only small amount of stool.  Stool in ED was soft.     Past Medical History:  Diagnosis Date  . Abnormality of gait 09/29/2012  . Allergic rhinitis   . Anemia, unspecified 01/12/2013  . Aortic insufficiency 04/16/2010   a. Echo 2011 - mild to moderate AI, EF 60%.  . Congestive heart failure, unspecified 06/30/2003  . Dementia   . Descending thoracic aortic dissection (HCC) 04/24/2010   a. MRA 2011 - begins distal to left subclavian and extends throughout the full length of the Ao.  Marland Kitchen External hemorrhoids without mention of complication 09/29/2012  . Failure to thrive in adult 04/01/2015  . GERD (gastroesophageal reflux disease)   . Hypertension   . Hyposmolality and/or hyponatremia 09/29/2012  . IBS (irritable bowel syndrome)   . Impacted cerumen 09/29/2012  . Lumbago 10/06/2012  . Osteoarthritis    hands  . Osteoporosis   . Other abnormal blood chemistry 01/12/2013  . Otitis externa 09/29/2012  . Palpitation    a. PAC's & PVC's by Holter - 2011  . Personal history of fall  10/06/2012  . Tortuous colon 2002  . Unspecified conductive hearing loss 09/29/2012  . Unspecified constipation 09/29/2012  . Unspecified venous (peripheral) insufficiency 09/29/2012    Patient Active Problem List   Diagnosis Date Noted  . Noninfectious gastroenteritis and colitis 06/29/2016  . Colitis 06/29/2016  . Acute urinary retention 08/28/2015  . Loss of weight 10/01/2014  . Breast lump on left side at 7 o'clock position 07/02/2014  . Left hip pain 06/04/2014  . Depression with anxiety 12/06/2013  . Gait disorder 09/29/2013  . Fall 08/31/2013  . GERD (gastroesophageal reflux disease) 07/03/2013  . Urinary tract infection, site not specified 05/29/2013  . DNR (do not resuscitate) 04/13/2013  . Anemia 03/08/2013  . Hypertension   . Constipation 05/25/2011  . Descending thoracic aortic dissection (HCC) 04/24/2010  . Aortic insufficiency 04/16/2010  . CARDIOMEGALY 03/12/2010  . PALPITATIONS, HX OF 02/03/2010  . Vitamin D deficiency 01/07/2010  . DISTURBANCE, VISUAL NOS 06/27/2007  . OSTEOARTHROSIS, GENERALIZED, MULTIPLE SITES 06/27/2007  . Dementia, vascular 03/07/2007  . Allergic rhinitis 03/07/2007  . IRRITABLE BOWEL SYNDROME 03/07/2007  . OVERACTIVE BLADDER 03/07/2007  . OSTEOPOROSIS 03/07/2007    Past Surgical History:  Procedure Laterality Date  . CATARACT EXTRACTION W/ INTRAOCULAR LENS IMPLANT    . DILATION AND CURETTAGE OF UTERUS  1960    OB History    No data available       Home Medications    Prior to Admission medications   Medication Sig Start Date  End Date Taking? Authorizing Provider  acetaminophen (TYLENOL) 325 MG tablet Take 650 mg by mouth every 6 (six) hours as needed for mild pain, moderate pain or headache.    Yes Historical Provider, MD  amLODipine (NORVASC) 5 MG tablet Take 5 mg by mouth daily.   Yes Historical Provider, MD  DULoxetine (CYMBALTA) 30 MG capsule Take 30 mg by mouth daily.   Yes Historical Provider, MD  feeding supplement  (BOOST / RESOURCE BREEZE) LIQD Take 1 Container by mouth 3 (three) times daily between meals.    Yes Historical Provider, MD  HYDROcodone-acetaminophen (NORCO/VICODIN) 5-325 MG tablet Take 1 tablet by mouth 2 (two) times daily.   Yes Historical Provider, MD  hydroxypropyl methylcellulose / hypromellose (ISOPTO TEARS / GONIOVISC) 2.5 % ophthalmic solution Place 1 drop into both eyes 2 (two) times daily.   Yes Historical Provider, MD  Memantine HCl-Donepezil HCl (NAMZARIC) 28-10 MG CP24 Take 1 capsule by mouth at bedtime.   Yes Historical Provider, MD  metoprolol (LOPRESSOR) 50 MG tablet Take 1 tablet (50 mg total) by mouth 2 (two) times daily. 03/30/12  Yes Ok Anis, NP  polyethylene glycol (MIRALAX / GLYCOLAX) packet Take 17 g by mouth daily as needed for mild constipation or moderate constipation.    Yes Historical Provider, MD  senna-docusate (SENOKOT-S) 8.6-50 MG tablet Take 1 tablet by mouth daily. 08/29/15  Yes Gerhard Munch, MD  carbamide peroxide [facility]) 6.5 % otic solution Place 5 drops into both ears 2 (two) times daily as needed. For cerumen impaction    Historical Provider, MD  simethicone (MYLICON) 40 MG/0.6ML drops Take 0.6 mLs (40 mg total) by mouth 4 (four) times daily as needed for flatulence. 06/28/16   Alvira Monday, MD    Family History Family History  Problem Relation Age of Onset  . Diabetes Mother   . Coronary artery disease Father   . Hypertension Father   . Diabetes      sisters    Social History Social History  Substance Use Topics  . Smoking status: Never Smoker  . Smokeless tobacco: Never Used  . Alcohol use No     Allergies   Alendronate sodium; Influenza vaccines; and Merthiolate [thimerosal]   Review of Systems Review of Systems  Unable to perform ROS: Dementia     Physical Exam Updated Vital Signs BP 180/81 (BP Location: Left Arm)   Pulse 75   Temp 98.4 F (36.9 C) (Oral)   Resp 18   SpO2 95%   Physical Exam  Constitutional:  She appears well-developed and well-nourished. No distress.  HENT:  Head: Normocephalic and atraumatic.  Eyes: Conjunctivae and EOM are normal.  Neck: Normal range of motion.  Cardiovascular: Normal rate, regular rhythm, normal heart sounds and intact distal pulses.  Exam reveals no gallop and no friction rub.   No murmur heard. Pulmonary/Chest: Effort normal and breath sounds normal. No respiratory distress. She has no wheezes. She has no rales.  Abdominal: Soft. She exhibits distension. There is tenderness (LLQ). There is no guarding.  Musculoskeletal: She exhibits no edema or tenderness.  Neurological: She is alert.  Skin: Skin is warm and dry. No rash noted. She is not diaphoretic. No erythema.  Nursing note and vitals reviewed.    ED Treatments / Results  Labs (all labs ordered are listed, but only abnormal results are displayed) Labs Reviewed  COMPREHENSIVE METABOLIC PANEL - Abnormal; Notable for the following:       Result Value   Chloride 99 (*)  BUN 32 (*)    AST 14 (*)    ALT 10 (*)    All other components within normal limits  URINALYSIS, ROUTINE W REFLEX MICROSCOPIC (NOT AT [facility]) - Abnormal; Notable for the following:    Color, Urine AMBER (*)    Specific Gravity, Urine 1.031 (*)    Protein, ur 100 (*)    All other components within normal limits  URINE MICROSCOPIC-ADD ON - Abnormal; Notable for the following:    Squamous Epithelial / LPF 0-5 (*)    Bacteria, UA RARE (*)    All other components within normal limits  CBC WITH DIFFERENTIAL/PLATELET  LIPASE, BLOOD    EKG  EKG Interpretation None       Radiology Dg Chest 2 View  Result Date: 06/28/2016 CLINICAL DATA:  Weakness and cough EXAM: CHEST  2 VIEW COMPARISON:  05/28/2012 FINDINGS: Cardiac shadow is enlarged but stable. The thoracic aorta is again tortuous somewhat accentuated by patient rotation. The overall appearance is stable. Elevation of left hemidiaphragm is same. No focal infiltrate or  sizable effusion is noted. Degenerative changes of the thoracic spine are seen. IMPRESSION: No acute abnormality noted. Electronically Signed   By: Alcide Clever M.D.   On: 06/28/2016 17:00   Ct Abdomen Pelvis W Contrast  Result Date: 06/28/2016 CLINICAL DATA:  Poor historian, complaining of constipation for a few days. History of dementia, hypertension, IBS, descending thoracic aortic aneurysm. EXAM: CT ABDOMEN AND PELVIS WITH CONTRAST TECHNIQUE: Multidetector CT imaging of the abdomen and pelvis was performed using the standard protocol following bolus administration of intravenous contrast. CONTRAST:  80mL ISOVUE-300 IOPAMIDOL (ISOVUE-300) INJECTION 61% COMPARISON:  CT abdomen dated 08/29/2015. FINDINGS: Lower chest:  No acute findings. Hepatobiliary: No focal mass or lesion identified within the liver, evaluation limited by patient motion artifact. Gallbladder not well seen in its entirety but grossly normal. Pancreas: Limited characterization due to patient motion artifact but grossly normal. Spleen: Within normal limits in size and appearance. Adrenals/Urinary Tract: Left renal cysts. Probable scarring within the right renal cortex, characterization again limited by the patient motion artifact. No renal stone or hydronephrosis bilaterally. No ureteral or bladder calculi identified. Bladder is decompressed. Stomach/Bowel: Stomach appears grossly normal. No dilated small bowel loops seen. Large amount of fluid and gas throughout the colon. No transition zone seen to suggest a bowel obstruction. No obvious evidence of bowel wall inflammation. Appendix is not seen. Vascular/Lymphatic: Dissection of the lower descending thoracic aorta and abdominal aorta, with final extension into the left common iliac artery, chronic in appearance, compatible with the given clinical data of known aortic dissection. No periaortic hemorrhage seen. No evidence of acute vascular abnormality. Reproductive: No mass or other  significant abnormality. Other: No free fluid or abscess collection identified. New no free intraperitoneal air. Musculoskeletal: Degenerative changes of the thoracolumbar spine, mild to moderate in degree. No acute or suspicious osseous finding. Masslike soft tissue density structure within the left breast, measuring approximately 2.3 cm greatest dimension, incompletely imaged. IMPRESSION: 1. No evidence of bowel obstruction. Large amount of fluid and gas throughout the colon. This can be an indication of underlying colitis. No CT evidence of constipation. 2. Aortic dissection, extending from the level of the lower thoracic aorta to the left common iliac artery, compatible with the given history of known dissection. No periaortic hemorrhage or other complicating feature appreciated. 3. Soft tissue density mass within the subcutaneous soft tissues of the left breast, measuring 2.3 cm. This could represent a breast cancer.  Consider further characterization with diagnostic mammogram/ultrasound at a dedicated Breast Center when able. 4. Remainder of the abdomen and pelvis is unremarkable, although limited by extensive patient motion artifact. No free fluid or abscess collection identified. No evidence of acute solid organ abnormality. No free intraperitoneal air seen. Electronically Signed   By: Bary RichardStan  Maynard M.D.   On: 06/28/2016 17:32    Procedures Procedures (including critical care time)  Medications Ordered in ED Medications  iopamidol (ISOVUE-300) 61 % injection 80 mL (80 mLs Intravenous Contrast Given 06/28/16 1659)  simethicone (MYLICON) 40 mg/0.1016ml suspension 40 mg (40 mg Oral Given 06/28/16 1826)     Initial Impression / Assessment and Plan / ED Course  I have reviewed the triage vital signs and the nursing notes.  Pertinent labs & imaging results that were available during my care of the patient were reviewed by me and considered in my medical decision making (see chart for details).  Clinical  Course   82 year old female with a history of hypertension, dementia, aortic insufficiency presents with concern of abdominal distention, pain, decreased bowel movements. Given patient's age, abdominal tenderness, we'll order labs and CT abdomen pelvis to evaluate for signs of obstruction or diverticulitis.  CT shows signs of colitis. Hx not consistent with c. Diff. Recommend continued supportive care. Will give simethicone for symptom relief.  Patient with oxygen saturations decreasing while sleeping, likely sleep apnea. CXR shows no abnormalities.  Discussed with daughter and do not feel given age and as pt not feeling shortness of breath that admission or further testing is appropriate and that sleep apnea is likely.  Recommend supportive care for colitis. Patient discharged in stable condition with understanding of reasons to return.   Final Clinical Impressions(s) / ED Diagnoses   Final diagnoses:  Colitis  Abdominal distention    New Prescriptions Discharge Medication List as of 06/28/2016  7:20 PM    START taking these medications   Details  simethicone (MYLICON) 40 MG/0.6ML drops Take 0.6 mLs (40 mg total) by mouth 4 (four) times daily as needed for flatulence., Starting Sun 06/28/2016, Print         Alvira MondayErin Dantae Meunier, MD 06/29/16 1535

## 2016-06-29 ENCOUNTER — Encounter: Payer: Self-pay | Admitting: Nurse Practitioner

## 2016-06-29 ENCOUNTER — Encounter: Payer: Self-pay | Admitting: Internal Medicine

## 2016-06-29 ENCOUNTER — Non-Acute Institutional Stay (SKILLED_NURSING_FACILITY): Payer: Medicare Other | Admitting: Internal Medicine

## 2016-06-29 DIAGNOSIS — F0150 Vascular dementia without behavioral disturbance: Secondary | ICD-10-CM | POA: Diagnosis not present

## 2016-06-29 DIAGNOSIS — K5900 Constipation, unspecified: Secondary | ICD-10-CM

## 2016-06-29 DIAGNOSIS — K529 Noninfective gastroenteritis and colitis, unspecified: Secondary | ICD-10-CM | POA: Insufficient documentation

## 2016-06-29 NOTE — Progress Notes (Signed)
Progress Note   Location:   [facility] Room Number: [id number] Place of Service:  SNF [phone]) Provider:  Murray Hodgkins MD  Murray Hodgkins, MD  Patient Care Team: Kimber Relic, MD as PCP - General (Internal Medicine) Laurey Morale, MD as Consulting Physician (Cardiology) Man Johnney Ou, NP as Nurse Practitioner (Internal Medicine)  Extended Emergency Contact Information Primary Emergency Contact: Mostek,Charles Address: [address]          [place], [place] [address] [place] of [place] Home Phone: [phone] Relation: Son Secondary Emergency Contact: Stuart,Cleleste Address: [address]          [place], [place] [address] [place] of [place] Home Phone: [phone] Mobile Phone: [phone] Relation: Daughter  Code Status:  DNR Goals of care: Advanced Directive information Advanced Directives 06/29/2016  Does patient have an advance directive? Yes  Type of Estate agent of [place];Out of facility DNR (pink MOST or yellow form)  Does patient want to make changes to advanced directive? No - Patient declined  Copy of advanced directive(s) in chart? Yes  Pre-existing out of facility DNR order (yellow form or pink MOST form) -     Chief Complaint  Patient presents with  . Hospitalization Follow-up    Dx: Colitis    HPI:  Pt is a 82 y.o. female seen today for a f/u s/p Emergency department visit on 06/28/2016. Patient was diagnosed as having colitis following her presentation for problems of abdominal distention and chronic constipation. Lab work was essentially unremarkable. CT of the abdomen showed findings consistent with a colitis and no excessive stool burden. WBCs were not elevated.  Patient had abdominal distention prior to going to the emergency room and still has abdominal distention today. There is no abdominal discomfort with palpation. There are hypoactive normal bowel sounds. Abdomen is very tympanitic  area  Patient has severe deafness and communications are difficult.  Past Medical History:  Diagnosis Date  . Abnormality of gait 09/29/2012  . Allergic rhinitis   . Anemia, unspecified 01/12/2013  . Aortic insufficiency 04/16/2010   a. Echo 2011 - mild to moderate AI, EF 60%.  . Congestive heart failure, unspecified 06/30/2003  . Dementia   . Descending thoracic aortic dissection (HCC) 04/24/2010   a. MRA 2011 - begins distal to left subclavian and extends throughout the full length of the Ao.  Marland Kitchen External hemorrhoids without mention of complication 09/29/2012  . Failure to thrive in adult 04/01/2015  . GERD (gastroesophageal reflux disease)   . Hypertension   . Hyposmolality and/or hyponatremia 09/29/2012  . IBS (irritable bowel syndrome)   . Impacted cerumen 09/29/2012  . Lumbago 10/06/2012  . Osteoarthritis    hands  . Osteoporosis   . Other abnormal blood chemistry 01/12/2013  . Otitis externa 09/29/2012  . Palpitation    a. PAC's & PVC's by Holter - 2011  . Personal history of fall 10/06/2012  . Tortuous colon 2002  . Unspecified conductive hearing loss 09/29/2012  . Unspecified constipation 09/29/2012  . Unspecified venous (peripheral) insufficiency 09/29/2012   Past Surgical History:  Procedure Laterality Date  . CATARACT EXTRACTION W/ INTRAOCULAR LENS IMPLANT    . DILATION AND CURETTAGE OF UTERUS  1960    Allergies  Allergen Reactions  . Alendronate Sodium Other (See Comments)    Reaction:  GI upset   . Influenza Vaccines Other (See Comments)    Reaction:  Unknown   . Merthiolate [Thimerosal]  Medication List       Accurate as of 06/29/16  9:59 AM. Always use your most recent med list.          acetaminophen 325 MG tablet Commonly known as:  TYLENOL Take 650 mg by mouth every 6 (six) hours as needed for mild pain, moderate pain or headache.   amLODipine 5 MG tablet Commonly known as:  NORVASC Take 5 mg by mouth daily.   carbamide peroxide 6.5 % otic  solution Commonly known as:  DEBROX Place 5 drops into both ears 2 (two) times daily as needed. For cerumen impaction   DULoxetine 30 MG capsule Commonly known as:  CYMBALTA Take 30 mg by mouth daily.   feeding supplement Liqd Take 1 Container by mouth 3 (three) times daily between meals.   HYDROcodone-acetaminophen 5-325 MG tablet Commonly known as:  NORCO/VICODIN Take 1 tablet by mouth 2 (two) times daily.   hydroxypropyl methylcellulose / hypromellose 2.5 % ophthalmic solution Commonly known as:  ISOPTO TEARS / GONIOVISC Place 1 drop into both eyes 2 (two) times daily.   metoprolol 50 MG tablet Commonly known as:  LOPRESSOR Take 1 tablet (50 mg total) by mouth 2 (two) times daily.   NAMZARIC 28-10 MG Cp24 Generic drug:  Memantine HCl-Donepezil HCl Take 1 capsule by mouth at bedtime.   polyethylene glycol packet Commonly known as:  MIRALAX / GLYCOLAX Take 17 g by mouth daily as needed for mild constipation or moderate constipation.   senna-docusate 8.6-50 MG tablet Commonly known as:  Senokot-S Take 1 tablet by mouth daily.   simethicone 40 MG/0.6ML drops Commonly known as:  MYLICON Take 0.6 mLs (40 mg total) by mouth 4 (four) times daily as needed for flatulence.       Review of Systems  Constitutional: Negative for activity change, appetite change, chills, diaphoresis, fatigue, fever and unexpected weight change.  HENT: Positive for hearing loss. Negative for congestion, ear discharge, ear pain, postnasal drip, rhinorrhea, sore throat, tinnitus, trouble swallowing and voice change.   Eyes: Negative for photophobia, pain, discharge, redness, itching and visual disturbance.  Respiratory: Negative for cough, choking, shortness of breath and wheezing.   Cardiovascular: Negative for chest pain, palpitations and leg swelling.  Gastrointestinal: Positive for abdominal distention and constipation. Negative for abdominal pain, blood in stool, diarrhea, nausea and vomiting.   Endocrine: Negative for cold intolerance, heat intolerance, polydipsia, polyphagia and polyuria.  Genitourinary: Positive for frequency. Negative for difficulty urinating, dysuria, flank pain, hematuria, pelvic pain, urgency and vaginal discharge.  Musculoskeletal: Positive for back pain. Negative for arthralgias, gait problem, myalgias, neck pain and neck stiffness.  Skin: Negative for color change, pallor and rash.       Left breast mass  Allergic/Immunologic: Negative.   Neurological: Negative for dizziness, tremors, seizures, syncope, weakness, numbness and headaches.       Dementia  Hematological: Negative for adenopathy. Does not bruise/bleed easily.  Psychiatric/Behavioral: Positive for confusion. Negative for agitation, behavioral problems, dysphoric mood, hallucinations, sleep disturbance and suicidal ideas. The patient is not nervous/anxious and is not hyperactive.        09/25/15 MMSE 21/30    Immunization History  Administered Date(s) Administered  . Influenza Whole 09/02/2005, 09/05/2007, 09/28/2012  . PPD Test 10/15/2013  . Pneumococcal Polysaccharide-23 11/24/2003  . Td 09/29/2002   Pertinent  Health Maintenance Due  Topic Date Due  . PNA vac Low Risk Adult (2 of 2 - PCV13) 11/23/2004  . INFLUENZA VACCINE  06/23/2016  . DEXA SCAN  Completed   Fall Risk  08/19/2015 04/01/2015 08/31/2013  Falls in the past year? No No Yes  Number falls in past yr: - - 2 or more  Risk for fall due to : - Impaired mobility;Impaired balance/gait Impaired balance/gait   Functional Status Survey:    Vitals:   06/29/16 0948  BP: 130/88  Pulse: 72  Resp: 20  Temp: 98.2 F (36.8 C)  Weight: 106 lb 1.6 oz (48.1 kg)  Height: 5\' 5"  (1.651 m)   Body mass index is 17.66 kg/m. Physical Exam  Constitutional: She appears well-developed and well-nourished. No distress.  HENT:  Head: Normocephalic and atraumatic.  Nose: Nose normal.  Mouth/Throat: Oropharynx is clear and moist. No  oropharyngeal exudate.  Eyes: Conjunctivae and EOM are normal. Pupils are equal, round, and reactive to light. Right eye exhibits no discharge. Left eye exhibits no discharge. No scleral icterus.  Neck: Normal range of motion. Neck supple. No JVD present. No thyromegaly present.  Cardiovascular: Normal rate and regular rhythm.   Murmur heard. Systolic murmur appreciated at the left sternal border 2/6  Pulmonary/Chest: Effort normal and breath sounds normal. No respiratory distress. She has no wheezes. She has no rales.  Left breast mass.  Abdominal: Soft. She exhibits distension. She exhibits no mass. There is no tenderness. There is no rebound and no guarding.  BS x4 quadrants  Musculoskeletal: Normal range of motion. She exhibits tenderness. She exhibits no edema.  Pain in the right shoulder/shoulder blade, neck, middle/lower back-gait instability and frequent falling. C/o aches allover-better since Norco tid. Scoliokyphosis.   Lymphadenopathy:    She has no cervical adenopathy.  Neurological: She is alert. She has normal reflexes. No cranial nerve deficit. She exhibits normal muscle tone. Coordination normal.  Demented  Skin: Skin is warm and dry. No rash noted. She is not diaphoretic. No erythema.  Mobile, smooth, a large marble size, non-tender at left breast 7 o'clock  Psychiatric: Her mood appears not anxious. Her affect is inappropriate. Her affect is not angry, not blunt and not labile. Her speech is delayed. Her speech is not rapid and/or pressured, not tangential and not slurred. She is slowed. She is not agitated, not aggressive, not hyperactive, not withdrawn and not actively hallucinating. Thought content is not paranoid and not delusional. Cognition and memory are impaired. She expresses impulsivity and inappropriate judgment. She does not exhibit a depressed mood. She is communicative. She exhibits abnormal recent memory. She exhibits normal remote memory. She is attentive.     Labs reviewed:  Recent Labs  08/29/15 0930  01/28/16 06/23/16 06/28/16 1549  NA 132*  < > 140 139 138  K 3.5  < > 3.8 3.8 4.2  CL 97*  --   --   --  99*  CO2 26  --   --   --  32  GLUCOSE 103*  --   --   --  96  BUN 18  < > 26* 22* 32*  CREATININE 0.51  < > 0.6 0.6 0.66  CALCIUM 9.2  --   --   --  9.2  < > = values in this interval not displayed.  Recent Labs  08/29/15 0930 01/28/16 06/23/16 06/28/16 1549  AST 24 11* 14 14*  ALT 18 7 9  10*  ALKPHOS 43 41 50 62  BILITOT 0.7  --   --  0.4  PROT 6.9  --   --  7.3  ALBUMIN 3.6  --   --  3.6  Recent Labs  08/29/15 0930 01/28/16 06/23/16 06/28/16 1549  WBC 7.2 6.0 6.0 6.5  NEUTROABS 5.8  --   --  4.6  HGB 11.8* 11.6* 13.4 12.5  HCT 36.4 36 40 39.5  MCV 92.2  --   --  92.7  PLT 153 124* 135* 168   Lab Results  Component Value Date   TSH 1.00 06/23/2016   Lab Results  Component Value Date   HGBA1C 5.9 01/17/2013   Lab Results  Component Value Date   CHOL 181 01/07/2010   HDL 93.60 01/07/2010   LDLCALC 78 01/07/2010   TRIG 49.0 01/07/2010   CHOLHDL 2 01/07/2010    Significant Diagnostic Results in last 30 days:  Dg Chest 2 View  Result Date: 06/28/2016 CLINICAL DATA:  Weakness and cough EXAM: CHEST  2 VIEW COMPARISON:  05/28/2012 FINDINGS: Cardiac shadow is enlarged but stable. The thoracic aorta is again tortuous somewhat accentuated by patient rotation. The overall appearance is stable. Elevation of left hemidiaphragm is same. No focal infiltrate or sizable effusion is noted. Degenerative changes of the thoracic spine are seen. IMPRESSION: No acute abnormality noted. Electronically Signed   By: Alcide CleverMark  Lukens M.D.   On: 06/28/2016 17:00   Ct Abdomen Pelvis W Contrast  Result Date: 06/28/2016 CLINICAL DATA:  Poor historian, complaining of constipation for a few days. History of dementia, hypertension, IBS, descending thoracic aortic aneurysm. EXAM: CT ABDOMEN AND PELVIS WITH CONTRAST TECHNIQUE: Multidetector  CT imaging of the abdomen and pelvis was performed using the standard protocol following bolus administration of intravenous contrast. CONTRAST:  80mL ISOVUE-300 IOPAMIDOL (ISOVUE-300) INJECTION 61% COMPARISON:  CT abdomen dated 08/29/2015. FINDINGS: Lower chest:  No acute findings. Hepatobiliary: No focal mass or lesion identified within the liver, evaluation limited by patient motion artifact. Gallbladder not well seen in its entirety but grossly normal. Pancreas: Limited characterization due to patient motion artifact but grossly normal. Spleen: Within normal limits in size and appearance. Adrenals/Urinary Tract: Left renal cysts. Probable scarring within the right renal cortex, characterization again limited by the patient motion artifact. No renal stone or hydronephrosis bilaterally. No ureteral or bladder calculi identified. Bladder is decompressed. Stomach/Bowel: Stomach appears grossly normal. No dilated small bowel loops seen. Large amount of fluid and gas throughout the colon. No transition zone seen to suggest a bowel obstruction. No obvious evidence of bowel wall inflammation. Appendix is not seen. Vascular/Lymphatic: Dissection of the lower descending thoracic aorta and abdominal aorta, with final extension into the left common iliac artery, chronic in appearance, compatible with the given clinical data of known aortic dissection. No periaortic hemorrhage seen. No evidence of acute vascular abnormality. Reproductive: No mass or other significant abnormality. Other: No free fluid or abscess collection identified. New no free intraperitoneal air. Musculoskeletal: Degenerative changes of the thoracolumbar spine, mild to moderate in degree. No acute or suspicious osseous finding. Masslike soft tissue density structure within the left breast, measuring approximately 2.3 cm greatest dimension, incompletely imaged. IMPRESSION: 1. No evidence of bowel obstruction. Large amount of fluid and gas throughout the  colon. This can be an indication of underlying colitis. No CT evidence of constipation. 2. Aortic dissection, extending from the level of the lower thoracic aorta to the left common iliac artery, compatible with the given history of known dissection. No periaortic hemorrhage or other complicating feature appreciated. 3. Soft tissue density mass within the subcutaneous soft tissues of the left breast, measuring 2.3 cm. This could represent a breast cancer. Consider further characterization with  diagnostic mammogram/ultrasound at a dedicated Breast Center when able. 4. Remainder of the abdomen and pelvis is unremarkable, although limited by extensive patient motion artifact. No free fluid or abscess collection identified. No evidence of acute solid organ abnormality. No free intraperitoneal air seen. Electronically Signed   By: Bary Richard M.D.   On: 06/28/2016 17:32    Assessment/Plan 1. Colitis Patient is afebrile and WBC is not elevated. Unlikely to be infectious in origin. Patient has previous history of IBS as well as chronic constipation.  2. Constipation, unspecified constipation type -Start Amitiza 24 mg daily  3. Dementia, vascular, without behavioral disturbance -Very difficult to discuss things with this patient because of her dementia and terrible deafness.

## 2016-06-29 NOTE — Progress Notes (Signed)
This encounter was created in error - please disregard.

## 2016-08-03 ENCOUNTER — Non-Acute Institutional Stay (SKILLED_NURSING_FACILITY): Payer: Medicare Other | Admitting: Nurse Practitioner

## 2016-08-03 ENCOUNTER — Encounter: Payer: Self-pay | Admitting: Nurse Practitioner

## 2016-08-03 DIAGNOSIS — K5900 Constipation, unspecified: Secondary | ICD-10-CM | POA: Diagnosis not present

## 2016-08-03 DIAGNOSIS — M159 Polyosteoarthritis, unspecified: Secondary | ICD-10-CM

## 2016-08-03 DIAGNOSIS — I10 Essential (primary) hypertension: Secondary | ICD-10-CM | POA: Diagnosis not present

## 2016-08-03 DIAGNOSIS — F418 Other specified anxiety disorders: Secondary | ICD-10-CM

## 2016-08-03 DIAGNOSIS — R634 Abnormal weight loss: Secondary | ICD-10-CM | POA: Diagnosis not present

## 2016-08-03 DIAGNOSIS — F0150 Vascular dementia without behavioral disturbance: Secondary | ICD-10-CM

## 2016-08-03 NOTE — Progress Notes (Signed)
Location:   [facility] Room Number: 22 Place of Service:  SNF (31) Provider:  Travontae Freiberger, Manxie  NP  Murray HodgkinsArthur Green, MD  Patient Care Team: Kimber RelicArthur G Green, MD as PCP - General (Internal Medicine) Laurey Moralealton S McLean, MD as Consulting Physician (Cardiology) Davionne Dowty Johnney OuX Rodrecus Belsky, NP as Nurse Practitioner (Internal Medicine)  Extended Emergency Contact Information Primary Emergency Contact: Rainey,Charles Address: [address]          [place], [place] [address] [place] Home Phone: [phone] Relation: Son Secondary Emergency Contact: Stuart,Cleleste Address: [address]          [place], [place] [address] [place] Home Phone: [phone] Mobile Phone: [phone] Relation: Daughter  Code Status:  DNR Goals of care: Advanced Directive information Advanced Directives 08/03/2016  Does patient have an advance directive? Yes  Type of Estate agentAdvance Directive Healthcare Power of D'HanisAttorney;Out of facility DNR (pink MOST or yellow form);Living will  Does patient want to make changes to advanced directive? No - Patient declined  Copy of advanced directive(s) in chart? Yes  Pre-existing out of facility DNR order (yellow form or pink MOST form) -     Chief Complaint  Patient presents with  . Medical Management of Chronic Issues    HPI:  Pt is a 82 y.o. female seen today for medical management of chronic diseases.    Hx of back pain, chronic, on Norco bid and Tylenol q6h prn, HTN controlled on Amlodipine 5mg  and Metoprolol 50mg  bid, GERD asymptomatic, off acid reducer, depression managed with Cymbalta 30mg , weight loss is gradual, constipation controlled on MiraLax prn and Amitiza daily, dementia, last MMSE 21/30 09/25/15, taking Aricept and Namenda to preserve memory, she is functioning well in SNF  Past Medical History:  Diagnosis Date  . Abnormality of gait 09/29/2012  . Allergic rhinitis   . Anemia, unspecified 01/12/2013  . Aortic insufficiency  04/16/2010   a. Echo 2011 - mild to moderate AI, EF 60%.  . Congestive heart failure, unspecified 06/30/2003  . Dementia   . Descending thoracic aortic dissection (HCC) 04/24/2010   a. MRA 2011 - begins distal to left subclavian and extends throughout the full length of the Ao.  Marland Kitchen. External hemorrhoids without mention of complication 09/29/2012  . Failure to thrive in adult 04/01/2015  . GERD (gastroesophageal reflux disease)   . Hypertension   . Hyposmolality and/or hyponatremia 09/29/2012  . IBS (irritable bowel syndrome)   . Impacted cerumen 09/29/2012  . Lumbago 10/06/2012  . Osteoarthritis    hands  . Osteoporosis   . Other abnormal blood chemistry 01/12/2013  . Otitis externa 09/29/2012  . Palpitation    a. PAC's & PVC's by Holter - 2011  . Personal history of fall 10/06/2012  . Tortuous colon 2002  . Unspecified conductive hearing loss 09/29/2012  . Unspecified constipation 09/29/2012  . Unspecified venous (peripheral) insufficiency 09/29/2012   Past Surgical History:  Procedure Laterality Date  . CATARACT EXTRACTION W/ INTRAOCULAR LENS IMPLANT    . DILATION AND CURETTAGE OF UTERUS  1960    Allergies  Allergen Reactions  . Alendronate Sodium Other (See Comments)    Reaction:  GI upset   . Influenza Vaccines Other (See Comments)    Reaction:  Unknown   . Merthiolate [Thimerosal]       Medication List       Accurate as of 08/03/16 12:45 PM. Always use your most recent med list.  acetaminophen 325 MG tablet Commonly known as:  TYLENOL Take 650 mg by mouth every 6 (six) hours as needed for mild pain, moderate pain or headache.   amLODipine 5 MG tablet Commonly known as:  NORVASC Take 5 mg by mouth daily.   carbamide peroxide 6.5 % otic solution Commonly known as:  DEBROX Place 5 drops into both ears 2 (two) times daily as needed. For cerumen impaction   DULoxetine 30 MG capsule Commonly known as:  CYMBALTA Take 30 mg by mouth daily.   feeding supplement  Liqd Take 1 Container by mouth 3 (three) times daily between meals.   HYDROcodone-acetaminophen 5-325 MG tablet Commonly known as:  NORCO/VICODIN Take 1 tablet by mouth 2 (two) times daily.   hydroxypropyl methylcellulose / hypromellose 2.5 % ophthalmic solution Commonly known as:  ISOPTO TEARS / GONIOVISC Place 1 drop into both eyes 2 (two) times daily.   lubiprostone 24 MCG capsule Commonly known as:  AMITIZA Take 24 mcg by mouth daily.   metoprolol 50 MG tablet Commonly known as:  LOPRESSOR Take 1 tablet (50 mg total) by mouth 2 (two) times daily.   NAMZARIC 28-10 MG Cp24 Generic drug:  Memantine HCl-Donepezil HCl Take 1 capsule by mouth at bedtime.   polyethylene glycol packet Commonly known as:  MIRALAX / GLYCOLAX Take 17 g by mouth daily as needed for mild constipation or moderate constipation.   simethicone 40 MG/0.6ML drops Commonly known as:  MYLICON Take 0.6 mLs (40 mg total) by mouth 4 (four) times daily as needed for flatulence.       Review of Systems  Constitutional: Negative for activity change, appetite change, chills, diaphoresis, fatigue, fever and unexpected weight change.  HENT: Positive for hearing loss. Negative for congestion, ear discharge, ear pain, postnasal drip, rhinorrhea, sore throat, tinnitus, trouble swallowing and voice change.   Eyes: Negative for photophobia, pain, discharge, redness, itching and visual disturbance.  Respiratory: Negative for cough, choking, shortness of breath and wheezing.   Cardiovascular: Negative for chest pain, palpitations and leg swelling.  Gastrointestinal: Positive for abdominal distention and constipation. Negative for abdominal pain, blood in stool, diarrhea, nausea and vomiting.  Endocrine: Negative for cold intolerance, heat intolerance, polydipsia, polyphagia and polyuria.  Genitourinary: Positive for frequency. Negative for difficulty urinating, dysuria, flank pain, hematuria, pelvic pain, urgency and  vaginal discharge.  Musculoskeletal: Positive for back pain. Negative for arthralgias, gait problem, myalgias, neck pain and neck stiffness.  Skin: Negative for color change, pallor and rash.       Left breast mass  Allergic/Immunologic: Negative.   Neurological: Negative for dizziness, tremors, seizures, syncope, weakness, numbness and headaches.       Dementia  Hematological: Negative for adenopathy. Does not bruise/bleed easily.  Psychiatric/Behavioral: Positive for confusion. Negative for agitation, behavioral problems, dysphoric mood, hallucinations, sleep disturbance and suicidal ideas. The patient is not nervous/anxious and is not hyperactive.        09/25/15 MMSE 21/30    Immunization History  Administered Date(s) Administered  . Influenza Whole 09/02/2005, 09/05/2007, 09/28/2012  . PPD Test 10/15/2013  . Pneumococcal Polysaccharide-23 11/24/2003  . Td 09/29/2002   Pertinent  Health Maintenance Due  Topic Date Due  . PNA vac Low Risk Adult (2 of 2 - PCV13) 11/23/2004  . INFLUENZA VACCINE  06/23/2016  . DEXA SCAN  Completed   Fall Risk  08/19/2015 04/01/2015 08/31/2013  Falls in the past year? No No Yes  Number falls in past yr: - - 2 or more  Risk for fall due to : - Impaired mobility;Impaired balance/gait Impaired balance/gait   Functional Status Survey:    Vitals:   08/03/16 1222  BP: 122/76  Pulse: 70  Resp: 20  Temp: 97.1 F (36.2 C)  Weight: 106 lb 6.4 oz (48.3 kg)  Height: 5\' 5"  (1.651 m)   Body mass index is 17.71 kg/m. Physical Exam  Constitutional: She appears well-developed and well-nourished. No distress.  HENT:  Head: Normocephalic and atraumatic.  Nose: Nose normal.  Mouth/Throat: Oropharynx is clear and moist. No oropharyngeal exudate.  Eyes: Conjunctivae and EOM are normal. Pupils are equal, round, and reactive to light. Right eye exhibits no discharge. Left eye exhibits no discharge. No scleral icterus.  Neck: Normal range of motion. Neck supple.  No JVD present. No thyromegaly present.  Cardiovascular: Normal rate and regular rhythm.   Murmur heard. Systolic murmur appreciated at the left sternal border 2/6  Pulmonary/Chest: Effort normal and breath sounds normal. No respiratory distress. She has no wheezes. She has no rales.  Left breast mass.  Abdominal: Soft. She exhibits distension. She exhibits no mass. There is no tenderness. There is no rebound and no guarding.  BS x4 quadrants  Musculoskeletal: Normal range of motion. She exhibits tenderness. She exhibits no edema.  Pain in the right shoulder/shoulder blade, neck, middle/lower back-gait instability and frequent falling. C/o aches allover-better since Norco tid. Scoliokyphosis.   Lymphadenopathy:    She has no cervical adenopathy.  Neurological: She is alert. She has normal reflexes. No cranial nerve deficit. She exhibits normal muscle tone. Coordination normal.  Demented  Skin: Skin is warm and dry. No rash noted. She is not diaphoretic. No erythema.  Mobile, smooth, a large marble size, non-tender at left breast 7 o'clock  Psychiatric: Her mood appears not anxious. Her affect is inappropriate. Her affect is not angry, not blunt and not labile. Her speech is delayed. Her speech is not rapid and/or pressured, not tangential and not slurred. She is slowed. She is not agitated, not aggressive, not hyperactive, not withdrawn and not actively hallucinating. Thought content is not paranoid and not delusional. Cognition and memory are impaired. She expresses impulsivity and inappropriate judgment. She does not exhibit a depressed mood. She is communicative. She exhibits abnormal recent memory. She exhibits normal remote memory. She is attentive.    Labs reviewed:  Recent Labs  08/29/15 0930  01/28/16 06/23/16 06/28/16 1549  NA 132*  < > 140 139 138  K 3.5  < > 3.8 3.8 4.2  CL 97*  --   --   --  99*  CO2 26  --   --   --  32  GLUCOSE 103*  --   --   --  96  BUN 18  < > 26* 22*  32*  CREATININE 0.51  < > 0.6 0.6 0.66  CALCIUM 9.2  --   --   --  9.2  < > = values in this interval not displayed.  Recent Labs  08/29/15 0930 01/28/16 06/23/16 06/28/16 1549  AST 24 11* 14 14*  ALT 18 7 9  10*  ALKPHOS 43 41 50 62  BILITOT 0.7  --   --  0.4  PROT 6.9  --   --  7.3  ALBUMIN 3.6  --   --  3.6    Recent Labs  08/29/15 0930 01/28/16 06/23/16 06/28/16 1549  WBC 7.2 6.0 6.0 6.5  NEUTROABS 5.8  --   --  4.6  HGB  11.8* 11.6* 13.4 12.5  HCT 36.4 36 40 39.5  MCV 92.2  --   --  92.7  PLT 153 124* 135* 168   Lab Results  Component Value Date   TSH 1.00 06/23/2016   Lab Results  Component Value Date   HGBA1C 5.9 01/17/2013   Lab Results  Component Value Date   CHOL 181 01/07/2010   HDL 93.60 01/07/2010   LDLCALC 78 01/07/2010   TRIG 49.0 01/07/2010   CHOLHDL 2 01/07/2010    Significant Diagnostic Results in last 30 days:  No results found.  Assessment/Plan There are no diagnoses linked to this encounter.Hypertension Controlled, continue Amlodipine 5mg  and Metoprolol 50mg  bid   Constipation Stable. Continue MiraLax and Amitiza daily  Dementia, vascular SNF for care. Continue Aricept and Namenda. MMSE 22/30 10/26/14, 21/30 09/25/15   OSTEOARTHROSIS, GENERALIZED, MULTIPLE SITES Pain is better controlled, chronic mid back pain in thoracic spine area,  continue Norco   Depression with anxiety off Mirtazapine and started Cymbalta 30mg  in setting of aches and pains 03/2015 Mood is stable.    Loss of weight Continue supportive care     Family/ staff Communication: continue SNF for care assistance.   Labs/tests ordered:  none

## 2016-08-03 NOTE — Assessment & Plan Note (Signed)
off Mirtazapine and started Cymbalta 30mg in setting of aches and pains 03/2015 Mood is stable.  

## 2016-08-03 NOTE — Assessment & Plan Note (Signed)
Controlled, continue Amlodipine 5mg and Metoprolol 50mg bid. 

## 2016-08-03 NOTE — Assessment & Plan Note (Signed)
Stable. Continue MiraLax and Amitiza daily   

## 2016-08-03 NOTE — Assessment & Plan Note (Signed)
Pain is better controlled, chronic mid back pain in thoracic spine area,  continue Norco 

## 2016-08-03 NOTE — Assessment & Plan Note (Signed)
SNF for care. Continue Aricept and Namenda. MMSE 22/30 10/26/14, 21/30 09/25/15 

## 2016-08-03 NOTE — Assessment & Plan Note (Signed)
Continue supportive care

## 2016-09-03 ENCOUNTER — Encounter: Payer: Self-pay | Admitting: *Deleted

## 2016-09-03 ENCOUNTER — Non-Acute Institutional Stay (SKILLED_NURSING_FACILITY): Payer: Medicare Other | Admitting: Nurse Practitioner

## 2016-09-03 DIAGNOSIS — N318 Other neuromuscular dysfunction of bladder: Secondary | ICD-10-CM | POA: Diagnosis not present

## 2016-09-03 DIAGNOSIS — I10 Essential (primary) hypertension: Secondary | ICD-10-CM | POA: Diagnosis not present

## 2016-09-03 DIAGNOSIS — K5900 Constipation, unspecified: Secondary | ICD-10-CM

## 2016-09-03 DIAGNOSIS — F0150 Vascular dementia without behavioral disturbance: Secondary | ICD-10-CM | POA: Diagnosis not present

## 2016-09-03 DIAGNOSIS — D649 Anemia, unspecified: Secondary | ICD-10-CM

## 2016-09-03 DIAGNOSIS — K219 Gastro-esophageal reflux disease without esophagitis: Secondary | ICD-10-CM

## 2016-09-03 DIAGNOSIS — F418 Other specified anxiety disorders: Secondary | ICD-10-CM | POA: Diagnosis not present

## 2016-09-03 DIAGNOSIS — M159 Polyosteoarthritis, unspecified: Secondary | ICD-10-CM | POA: Diagnosis not present

## 2016-09-03 NOTE — Assessment & Plan Note (Signed)
Stable. Continue MiraLax and Amitiza daily   

## 2016-09-03 NOTE — Assessment & Plan Note (Signed)
Pain is better controlled, chronic mid back pain in thoracic spine area,  continue Norco 

## 2016-09-03 NOTE — Assessment & Plan Note (Signed)
off Mirtazapine and started Cymbalta 30mg in setting of aches and pains 03/2015 Mood is stable.  

## 2016-09-03 NOTE — Assessment & Plan Note (Signed)
Stable, off PPI  

## 2016-09-03 NOTE — Assessment & Plan Note (Signed)
Adult brief to manage urinary incontinence 

## 2016-09-03 NOTE — Assessment & Plan Note (Signed)
Hgb 11-12

## 2016-09-03 NOTE — Progress Notes (Signed)
Location:  [facility] Room Number: 22 Place of Service:  SNF (31) Provider:  Mast, Manxie  NP  Murray Hodgkins, MD  Patient Care Team: Kimber Relic, MD as PCP - General (Internal Medicine) Laurey Morale, MD as Consulting Physician (Cardiology) Man Johnney Ou, NP as Nurse Practitioner (Internal Medicine)  Extended Emergency Contact Information Primary Emergency Contact: Tomkinson,Charles Address: [address]          [place], [place] [address] [place] Home Phone: [phone] Relation: Son Secondary Emergency Contact: Stuart,Cleleste Address: [address]          [place], [place] [address] [place] Home Phone: [phone] Mobile Phone: [phone] Relation: Daughter  Code Status:  DNR Goals of care: Advanced Directive information Advanced Directives 09/03/2016  Does patient have an advance directive? Yes  Type of Estate agent of [place];Living will;Out of facility DNR (pink MOST or yellow form)  Does patient want to make changes to advanced directive? No - Patient declined  Copy of advanced directive(s) in chart? Yes  Pre-existing out of facility DNR order (yellow form or pink MOST form) -     Chief Complaint  Patient presents with  . Medical Management of Chronic Issues    HPI:  Pt is a 82 y.o. female seen today for medical management of chronic diseases.     Hx of back pain, chronic, on Norco bid and Tylenol q6h prn, HTN controlled on Amlodipine 5mg  and Metoprolol 50mg  bid, GERD asymptomatic, off acid reducer, depression managed with Cymbalta 30mg , weight loss is gradual, constipation controlled on MiraLax prn and Amitiza daily, dementia, last MMSE 21/30 09/25/15, taking Aricept and Namenda to preserve memory, she is functioning well in SNF     Past Medical History:  Diagnosis Date  . Abnormality of gait 09/29/2012  . Allergic rhinitis   . Anemia, unspecified 01/12/2013  . Aortic  insufficiency 04/16/2010   a. Echo 2011 - mild to moderate AI, EF 60%.  . Congestive heart failure, unspecified 06/30/2003  . Dementia   . Descending thoracic aortic dissection (HCC) 04/24/2010   a. MRA 2011 - begins distal to left subclavian and extends throughout the full length of the Ao.  Marland Kitchen External hemorrhoids without mention of complication 09/29/2012  . Failure to thrive in adult 04/01/2015  . GERD (gastroesophageal reflux disease)   . Hypertension   . Hyposmolality and/or hyponatremia 09/29/2012  . IBS (irritable bowel syndrome)   . Impacted cerumen 09/29/2012  . Lumbago 10/06/2012  . Osteoarthritis    hands  . Osteoporosis   . Other abnormal blood chemistry 01/12/2013  . Otitis externa 09/29/2012  . Palpitation    a. PAC's & PVC's by Holter - 2011  . Personal history of fall 10/06/2012  . Tortuous colon 2002  . Unspecified conductive hearing loss 09/29/2012  . Unspecified constipation 09/29/2012  . Unspecified venous (peripheral) insufficiency 09/29/2012   Past Surgical History:  Procedure Laterality Date  . CATARACT EXTRACTION W/ INTRAOCULAR LENS IMPLANT    . DILATION AND CURETTAGE OF UTERUS  1960    Allergies  Allergen Reactions  . Alendronate Sodium Other (See Comments)    Reaction:  GI upset   . Influenza Vaccines Other (See Comments)    Reaction:  Unknown   . Merthiolate [Thimerosal]       Medication List       Accurate as of 09/03/16 12:45 PM. Always use your most recent med list.  acetaminophen 325 MG tablet Commonly known as:  TYLENOL Take 650 mg by mouth every 6 (six) hours as needed for mild pain, moderate pain or headache.   amLODipine 5 MG tablet Commonly known as:  NORVASC Take 5 mg by mouth daily.   carbamide peroxide 6.5 % otic solution Commonly known as:  DEBROX Place 5 drops into both ears 2 (two) times daily as needed. For cerumen impaction   DULoxetine 30 MG capsule Commonly known as:  CYMBALTA Take 30 mg by mouth daily.     feeding supplement Liqd Take 1 Container by mouth 3 (three) times daily between meals.   HYDROcodone-acetaminophen 5-325 MG tablet Commonly known as:  NORCO/VICODIN Take 1 tablet by mouth 2 (two) times daily.   hydroxypropyl methylcellulose / hypromellose 2.5 % ophthalmic solution Commonly known as:  ISOPTO TEARS / GONIOVISC Place 1 drop into both eyes 2 (two) times daily.   lubiprostone 24 MCG capsule Commonly known as:  AMITIZA Take 24 mcg by mouth daily.   metoprolol 50 MG tablet Commonly known as:  LOPRESSOR Take 1 tablet (50 mg total) by mouth 2 (two) times daily.   NAMZARIC 28-10 MG Cp24 Generic drug:  Memantine HCl-Donepezil HCl Take 1 capsule by mouth at bedtime.   polyethylene glycol packet Commonly known as:  MIRALAX / GLYCOLAX Take 17 g by mouth daily as needed for mild constipation or moderate constipation.   simethicone 40 MG/0.6ML drops Commonly known as:  MYLICON Take 0.6 mLs (40 mg total) by mouth 4 (four) times daily as needed for flatulence.       Review of Systems  Constitutional: Negative for activity change, appetite change, chills, diaphoresis, fatigue, fever and unexpected weight change.  HENT: Positive for hearing loss. Negative for congestion, ear discharge, ear pain, postnasal drip, rhinorrhea, sore throat, tinnitus, trouble swallowing and voice change.   Eyes: Negative for photophobia, pain, discharge, redness, itching and visual disturbance.  Respiratory: Negative for cough, choking, shortness of breath and wheezing.   Cardiovascular: Negative for chest pain, palpitations and leg swelling.  Gastrointestinal: Positive for abdominal distention and constipation. Negative for abdominal pain, blood in stool, diarrhea, nausea and vomiting.  Endocrine: Negative for cold intolerance, heat intolerance, polydipsia, polyphagia and polyuria.  Genitourinary: Positive for frequency. Negative for difficulty urinating, dysuria, flank pain, hematuria, pelvic  pain, urgency and vaginal discharge.  Musculoskeletal: Positive for back pain. Negative for arthralgias, gait problem, myalgias, neck pain and neck stiffness.  Skin: Negative for color change, pallor and rash.       Left breast mass  Allergic/Immunologic: Negative.   Neurological: Negative for dizziness, tremors, seizures, syncope, weakness, numbness and headaches.       Dementia  Hematological: Negative for adenopathy. Does not bruise/bleed easily.  Psychiatric/Behavioral: Positive for confusion. Negative for agitation, behavioral problems, dysphoric mood, hallucinations, sleep disturbance and suicidal ideas. The patient is not nervous/anxious and is not hyperactive.        09/25/15 MMSE 21/30    Immunization History  Administered Date(s) Administered  . Influenza Whole 09/02/2005, 09/05/2007, 09/28/2012  . PPD Test 10/15/2013  . Pneumococcal Polysaccharide-23 11/24/2003  . Td 09/29/2002   Pertinent  Health Maintenance Due  Topic Date Due  . PNA vac Low Risk Adult (2 of 2 - PCV13) 11/23/2004  . INFLUENZA VACCINE  06/23/2016  . DEXA SCAN  Completed   Fall Risk  08/19/2015 04/01/2015 08/31/2013  Falls in the past year? No No Yes  Number falls in past yr: - - 2 or more  Risk for fall due to : - Impaired mobility;Impaired balance/gait Impaired balance/gait   Functional Status Survey:    Vitals:   09/03/16 1048  BP: (!) 146/76  Pulse: 66  Resp: 17  Temp: 98.5 F (36.9 C)  Weight: 106 lb 12.8 oz (48.4 kg)  Height: 5\' 5"  (1.651 m)   Body mass index is 17.77 kg/m. Physical Exam  Constitutional: She appears well-developed and well-nourished. No distress.  HENT:  Head: Normocephalic and atraumatic.  Nose: Nose normal.  Mouth/Throat: Oropharynx is clear and moist. No oropharyngeal exudate.  Eyes: Conjunctivae and EOM are normal. Pupils are equal, round, and reactive to light. Right eye exhibits no discharge. Left eye exhibits no discharge. No scleral icterus.  Neck: Normal range  of motion. Neck supple. No JVD present. No thyromegaly present.  Cardiovascular: Normal rate and regular rhythm.   Murmur heard. Systolic murmur appreciated at the left sternal border 2/6  Pulmonary/Chest: Effort normal and breath sounds normal. No respiratory distress. She has no wheezes. She has no rales.  Left breast mass.  Abdominal: Soft. She exhibits distension. She exhibits no mass. There is no tenderness. There is no rebound and no guarding.  BS x4 quadrants  Musculoskeletal: Normal range of motion. She exhibits tenderness. She exhibits no edema.  Pain in the right shoulder/shoulder blade, neck, middle/lower back-gait instability and frequent falling. C/o aches allover-better since Norco tid. Scoliokyphosis.   Lymphadenopathy:    She has no cervical adenopathy.  Neurological: She is alert. She has normal reflexes. No cranial nerve deficit. She exhibits normal muscle tone. Coordination normal.  Demented  Skin: Skin is warm and dry. No rash noted. She is not diaphoretic. No erythema.  Mobile, smooth, a large marble size, non-tender at left breast 7 o'clock  Psychiatric: Her mood appears not anxious. Her affect is inappropriate. Her affect is not angry, not blunt and not labile. Her speech is delayed. Her speech is not rapid and/or pressured, not tangential and not slurred. She is slowed. She is not agitated, not aggressive, not hyperactive, not withdrawn and not actively hallucinating. Thought content is not paranoid and not delusional. Cognition and memory are impaired. She expresses impulsivity and inappropriate judgment. She does not exhibit a depressed mood. She is communicative. She exhibits abnormal recent memory. She exhibits normal remote memory. She is attentive.    Labs reviewed:  Recent Labs  01/28/16 06/23/16 06/28/16 1549  NA 140 139 138  K 3.8 3.8 4.2  CL  --   --  99*  CO2  --   --  32  GLUCOSE  --   --  96  BUN 26* 22* 32*  CREATININE 0.6 0.6 0.66  CALCIUM  --    --  9.2    Recent Labs  01/28/16 06/23/16 06/28/16 1549  AST 11* 14 14*  ALT 7 9 10*  ALKPHOS 41 50 62  BILITOT  --   --  0.4  PROT  --   --  7.3  ALBUMIN  --   --  3.6    Recent Labs  01/28/16 06/23/16 06/28/16 1549  WBC 6.0 6.0 6.5  NEUTROABS  --   --  4.6  HGB 11.6* 13.4 12.5  HCT 36 40 39.5  MCV  --   --  92.7  PLT 124* 135* 168   Lab Results  Component Value Date   TSH 1.00 06/23/2016   Lab Results  Component Value Date   HGBA1C 5.9 01/17/2013   Lab Results  Component  Value Date   CHOL 181 01/07/2010   HDL 93.60 01/07/2010   LDLCALC 78 01/07/2010   TRIG 49.0 01/07/2010   CHOLHDL 2 01/07/2010    Significant Diagnostic Results in last 30 days:  No results found.  Assessment/Plan Hypertension Controlled, continue Amlodipine 5mg  and Metoprolol 50mg  bid    Constipation Stable. Continue MiraLax and Amitiza daily   GERD (gastroesophageal reflux disease) Stable, off PPI   Dementia, vascular SNF for care. Continue Aricept and Namenda. MMSE 22/30 10/26/14, 21/30 09/25/15    OSTEOARTHROSIS, GENERALIZED, MULTIPLE SITES Pain is better controlled, chronic mid back pain in thoracic spine area,  continue Norco    OVERACTIVE BLADDER Adult brief to manage urinary incontinence   Anemia Hgb 11-12    Depression with anxiety off Mirtazapine and started Cymbalta 30mg  in setting of aches and pains 03/2015 Mood is stable.       Family/ staff Communication:continue SNF for care assistance.   Labs/tests ordered:  none

## 2016-09-03 NOTE — Assessment & Plan Note (Signed)
SNF for care. Continue Aricept and Namenda. MMSE 22/30 10/26/14, 21/30 09/25/15 

## 2016-09-03 NOTE — Assessment & Plan Note (Signed)
Controlled, continue Amlodipine 5mg and Metoprolol 50mg bid. 

## 2016-10-02 ENCOUNTER — Encounter: Payer: Self-pay | Admitting: Internal Medicine

## 2016-10-02 ENCOUNTER — Non-Acute Institutional Stay (SKILLED_NURSING_FACILITY): Payer: Medicare Other | Admitting: Internal Medicine

## 2016-10-02 DIAGNOSIS — F418 Other specified anxiety disorders: Secondary | ICD-10-CM

## 2016-10-02 DIAGNOSIS — N6324 Unspecified lump in the left breast, lower inner quadrant: Secondary | ICD-10-CM | POA: Diagnosis not present

## 2016-10-02 DIAGNOSIS — K5900 Constipation, unspecified: Secondary | ICD-10-CM

## 2016-10-02 DIAGNOSIS — F0150 Vascular dementia without behavioral disturbance: Secondary | ICD-10-CM

## 2016-10-02 DIAGNOSIS — I10 Essential (primary) hypertension: Secondary | ICD-10-CM | POA: Diagnosis not present

## 2016-10-02 DIAGNOSIS — D649 Anemia, unspecified: Secondary | ICD-10-CM | POA: Diagnosis not present

## 2016-10-02 NOTE — Progress Notes (Signed)
Progress Note    [facility] Room Number: 22 Place of Service:  SNF [phone]) Provider:  Murray HodgkinsArthur Roxsana Riding, MD  Patient Care Team: Kimber RelicArthur G Dayvon Dax, MD as PCP - General (Internal Medicine) Laurey Moralealton S McLean, MD as Consulting Physician (Cardiology) Man Johnney OuX Mast, NP as Nurse Practitioner (Internal Medicine)  Extended Emergency Contact Information Primary Emergency Contact: Gazzola,Charles Address: [address]          [place], [place] [address] [place] Home Phone: [phone] Relation: Son Secondary Emergency Contact: Stuart,Cleleste Address: [address]          [place], [place] [address] [place] Home Phone: [phone] Mobile Phone: [phone] Relation: Daughter  Code Status:  DNR Goals of care: Advanced Directive information Advanced Directives 09/03/2016  Does patient have an advance directive? Yes  Type of Estate agentAdvance Directive Healthcare Power of [place];Living will;Out of facility DNR (pink MOST or yellow form)  Does patient want to make changes to advanced directive? No - Patient declined  Copy of advanced directive(s) in chart? Yes  Pre-existing out of facility DNR order (yellow form or pink MOST form) -     Chief Complaint  Patient presents with  . Medical Management of Chronic Issues    routine visit    HPI:  Pt is a 82 y.o. female seen today for medical management of chronic diseases.    Vascular dementia without behavioral disturbance - unchanged  Anemia, unspecified type - normll hgb. Resolved.  Essential hypertension - controlled  Depression with anxiety - stable Still morose at times.  Constipation, unspecified constipation type - SAhe says she is doing better.     Past Medical History:  Diagnosis Date  . Abnormality of gait 09/29/2012  . Allergic rhinitis   . Anemia, unspecified 01/12/2013  . Aortic insufficiency 04/16/2010   a. Echo 2011 - mild to moderate AI, EF 60%.  .  Congestive heart failure, unspecified 06/30/2003  . Dementia   . Descending thoracic aortic dissection (HCC) 04/24/2010   a. MRA 2011 - begins distal to left subclavian and extends throughout the full length of the Ao.  Marland Kitchen. External hemorrhoids without mention of complication 09/29/2012  . Failure to thrive in adult 04/01/2015  . GERD (gastroesophageal reflux disease)   . Hypertension   . Hyposmolality and/or hyponatremia 09/29/2012  . IBS (irritable bowel syndrome)   . Impacted cerumen 09/29/2012  . Lumbago 10/06/2012  . Osteoarthritis    hands  . Osteoporosis   . Other abnormal blood chemistry 01/12/2013  . Otitis externa 09/29/2012  . Palpitation    a. PAC's & PVC's by Holter - 2011  . Personal history of fall 10/06/2012  . Tortuous colon 2002  . Unspecified conductive hearing loss 09/29/2012  . Unspecified constipation 09/29/2012  . Unspecified venous (peripheral) insufficiency 09/29/2012   Past Surgical History:  Procedure Laterality Date  . CATARACT EXTRACTION W/ INTRAOCULAR LENS IMPLANT    . DILATION AND CURETTAGE OF UTERUS  1960    Allergies  Allergen Reactions  . Alendronate Sodium Other (See Comments)    Reaction:  GI upset   . Influenza Vaccines Other (See Comments)    Reaction:  Unknown   . Merthiolate [Thimerosal]       Medication List       Accurate as of 10/02/16 11:25 AM. Always use your most recent med list.          acetaminophen 325 MG tablet Commonly known as:  TYLENOL Take 650 mg by mouth every 6 (six) hours as needed for mild pain, moderate pain or headache.   amLODipine 5 MG tablet Commonly known as:  NORVASC Take 5 mg by mouth daily.   carbamide peroxide 6.5 % otic solution Commonly known as:  DEBROX Place 5 drops into both ears 2 (two) times daily as needed. For cerumen impaction   DULoxetine 30 MG capsule Commonly known as:  CYMBALTA Take 30 mg by mouth daily.   feeding supplement Liqd Take 1 Container by mouth 3 (three) times daily  between meals.   HYDROcodone-acetaminophen 5-325 MG tablet Commonly known as:  NORCO/VICODIN Take 1 tablet by mouth. Take one tablet three times a day for pain   hydroxypropyl methylcellulose / hypromellose 2.5 % ophthalmic solution Commonly known as:  ISOPTO TEARS / GONIOVISC Place 1 drop into both eyes 2 (two) times daily.   lubiprostone 24 MCG capsule Commonly known as:  AMITIZA Take 24 mcg by mouth daily.   metoprolol 50 MG tablet Commonly known as:  LOPRESSOR Take 1 tablet (50 mg total) by mouth 2 (two) times daily.   NAMZARIC 28-10 MG Cp24 Generic drug:  Memantine HCl-Donepezil HCl Take 1 capsule by mouth at bedtime.   polyethylene glycol packet Commonly known as:  MIRALAX / GLYCOLAX Take 17 g by mouth daily as needed for mild constipation or moderate constipation.   simethicone 40 MG/0.6ML drops Commonly known as:  MYLICON Take 0.6 mLs (40 mg total) by mouth 4 (four) times daily as needed for flatulence.       Review of Systems  Constitutional: Negative for activity change, appetite change, chills, diaphoresis, fatigue, fever and unexpected weight change.  HENT: Positive for hearing loss. Negative for congestion, ear discharge, ear pain, postnasal drip, rhinorrhea, sore throat, tinnitus, trouble swallowing and voice change.   Eyes: Negative for photophobia, pain, discharge, redness, itching and visual disturbance.  Respiratory: Negative for cough, choking, shortness of breath and wheezing.   Cardiovascular: Negative for chest pain, palpitations and leg swelling.  Gastrointestinal: Positive for abdominal distention and constipation. Negative for abdominal pain, blood in stool, diarrhea, nausea and vomiting.  Endocrine: Negative for cold intolerance, heat intolerance, polydipsia, polyphagia and polyuria.  Genitourinary: Positive for frequency. Negative for difficulty urinating, dysuria, flank pain, hematuria, pelvic pain, urgency and vaginal discharge.  Musculoskeletal:  Positive for back pain. Negative for arthralgias, gait problem, myalgias, neck pain and neck stiffness.  Skin: Negative for color change, pallor and rash.       Left breast mass  Allergic/Immunologic: Negative.   Neurological: Negative for dizziness, tremors, seizures, syncope, weakness, numbness and headaches.       Dementia  Hematological: Negative for adenopathy. Does not bruise/bleed easily.  Psychiatric/Behavioral: Positive for confusion. Negative for agitation, behavioral problems, dysphoric mood, hallucinations, sleep disturbance and suicidal ideas. The patient is not nervous/anxious and is not hyperactive.        09/25/15 MMSE 21/30    Immunization History  Administered Date(s) Administered  . Influenza Whole 09/02/2005, 09/05/2007, 09/28/2012  . PPD Test 10/15/2013  . Pneumococcal Polysaccharide-23 11/24/2003  . Td 09/29/2002   Pertinent  Health Maintenance Due  Topic Date Due  . PNA vac Low Risk Adult (2 of 2 - PCV13) 11/23/2004  . INFLUENZA VACCINE  09/22/2017 (Originally 06/23/2016)  . DEXA SCAN  Completed   Fall Risk  08/19/2015 04/01/2015 08/31/2013  Falls in the past year? No No Yes  Number falls in past yr: - - 2 or more  Risk  for fall due to : - Impaired mobility;Impaired balance/gait Impaired balance/gait   Functional Status Survey:    Vitals:   10/02/16 1111  BP: 139/80  Pulse: (!) 57  Resp: (!) 93  Temp: 97.9 F (36.6 C)  Weight: 105 lb (47.6 kg)  Height: 5\' 5"  (1.651 m)   Body mass index is 17.47 kg/m. Physical Exam  Constitutional: She appears well-developed and well-nourished. No distress.  HENT:  Head: Normocephalic and atraumatic.  Nose: Nose normal.  Mouth/Throat: Oropharynx is clear and moist. No oropharyngeal exudate.  Eyes: Conjunctivae and EOM are normal. Pupils are equal, round, and reactive to light. Right eye exhibits no discharge. Left eye exhibits no discharge. No scleral icterus.  Neck: Normal range of motion. Neck supple. No JVD  present. No thyromegaly present.  Cardiovascular: Normal rate and regular rhythm.   Murmur heard. Systolic murmur appreciated at the left sternal border 2/6  Pulmonary/Chest: Effort normal and breath sounds normal. No respiratory distress. She has no wheezes. She has no rales.  Left breast mass.  Abdominal: Soft. She exhibits distension. She exhibits no mass. There is no tenderness. There is no rebound and no guarding.  BS x4 quadrants  Musculoskeletal: Normal range of motion. She exhibits tenderness. She exhibits no edema.  Pain in the right shoulder/shoulder blade, neck, middle/lower back-gait instability and frequent falling. C/o aches allover-better since Norco tid. Scoliokyphosis.   Lymphadenopathy:    She has no cervical adenopathy.  Neurological: She is alert. She has normal reflexes. No cranial nerve deficit. She exhibits normal muscle tone. Coordination normal.  Demented  Skin: Skin is warm and dry. No rash noted. She is not diaphoretic. No erythema.  Mobile, smooth, a large marble size, non-tender at left breast 7 o'clock  Psychiatric: Her mood appears not anxious. Her affect is inappropriate. Her affect is not angry, not blunt and not labile. Her speech is delayed. Her speech is not rapid and/or pressured, not tangential and not slurred. She is slowed. She is not agitated, not aggressive, not hyperactive, not withdrawn and not actively hallucinating. Thought content is not paranoid and not delusional. Cognition and memory are impaired. She expresses impulsivity and inappropriate judgment. She does not exhibit a depressed mood. She is communicative. She exhibits abnormal recent memory. She exhibits normal remote memory. She is attentive.    Labs reviewed:  Recent Labs  01/28/16 06/23/16 06/28/16 1549  NA 140 139 138  K 3.8 3.8 4.2  CL  --   --  99*  CO2  --   --  32  GLUCOSE  --   --  96  BUN 26* 22* 32*  CREATININE 0.6 0.6 0.66  CALCIUM  --   --  9.2    Recent Labs   01/28/16 06/23/16 06/28/16 1549  AST 11* 14 14*  ALT 7 9 10*  ALKPHOS 41 50 62  BILITOT  --   --  0.4  PROT  --   --  7.3  ALBUMIN  --   --  3.6    Recent Labs  01/28/16 06/23/16 06/28/16 1549  WBC 6.0 6.0 6.5  NEUTROABS  --   --  4.6  HGB 11.6* 13.4 12.5  HCT 36 40 39.5  MCV  --   --  92.7  PLT 124* 135* 168   Lab Results  Component Value Date   TSH 1.00 06/23/2016   Lab Results  Component Value Date   HGBA1C 5.9 01/17/2013   Lab Results  Component Value Date  CHOL 181 01/07/2010   HDL 93.60 01/07/2010   LDLCALC 78 01/07/2010   TRIG 49.0 01/07/2010   CHOLHDL 2 01/07/2010    Assessment/Plan 1. Vascular dementia without behavioral disturbance unchanged  2. Anemia, unspecified type resolved  3. Essential hypertension controlled  4. Depression with anxiety Partial remission  5. Constipation, unspecified constipation type improved  6. Breast lump on left side at 7 o'clock position non tender and unchanged

## 2016-11-04 ENCOUNTER — Non-Acute Institutional Stay (SKILLED_NURSING_FACILITY): Payer: Medicare Other | Admitting: Nurse Practitioner

## 2016-11-04 ENCOUNTER — Encounter: Payer: Self-pay | Admitting: Nurse Practitioner

## 2016-11-04 DIAGNOSIS — K219 Gastro-esophageal reflux disease without esophagitis: Secondary | ICD-10-CM | POA: Diagnosis not present

## 2016-11-04 DIAGNOSIS — N318 Other neuromuscular dysfunction of bladder: Secondary | ICD-10-CM | POA: Diagnosis not present

## 2016-11-04 DIAGNOSIS — F418 Other specified anxiety disorders: Secondary | ICD-10-CM

## 2016-11-04 DIAGNOSIS — I10 Essential (primary) hypertension: Secondary | ICD-10-CM

## 2016-11-04 DIAGNOSIS — M159 Polyosteoarthritis, unspecified: Secondary | ICD-10-CM | POA: Diagnosis not present

## 2016-11-04 DIAGNOSIS — F0150 Vascular dementia without behavioral disturbance: Secondary | ICD-10-CM | POA: Diagnosis not present

## 2016-11-04 DIAGNOSIS — K5900 Constipation, unspecified: Secondary | ICD-10-CM

## 2016-11-04 NOTE — Assessment & Plan Note (Signed)
off Mirtazapine and started Cymbalta 30mg  in setting of aches and pains 03/2015 Mood is stable.

## 2016-11-04 NOTE — Assessment & Plan Note (Signed)
SNF for care. Continue Aricept and Namenda. MMSE 22/30 10/26/14, 21/30 09/25/15 

## 2016-11-04 NOTE — Assessment & Plan Note (Signed)
Controlled, continue Amlodipine 5mg and Metoprolol 50mg bid. 

## 2016-11-04 NOTE — Progress Notes (Signed)
Location[facility]  [facility] Room Number: 22 Place of Service:  SNF [phone] Provider:    Murray HodgkinsArthur Green, MD  Patient Care Team: Kimber RelicArthur G Green, MD as PCP - General (Internal Medicine) Laurey Moralealton S McLean, MD as Consulting Physician (Cardiology) Notnamed Croucher Johnney OuX Takashi Korol, NP as Nurse Practitioner (Internal Medicine)  Extended Emergency Contact Information Primary Emergency Contact: Schirmer,Charles Address: [address]          [place], [place] [address] [place] Home Phone: [phone] Relation: Son Secondary Emergency Contact: Stuart,Cleleste Address: [address]          [place], [place] [address] [place] Home Phone: [phone] Mobile Phone: [phone] Relation: Daughter  Code Status:  DNR Goals of care: Advanced Directive information Advanced Directives 11/04/2016  Does Patient Have a Medical Advance Directive? Yes  Type of Estate agentAdvance Directive Healthcare Power of [place];Living will  Does patient want to make changes to medical advance directive? No - Patient declined  Copy of Healthcare Power of Attorney in Chart? Yes  Pre-existing out of facility DNR order (yellow form or pink MOST form) -     Chief Complaint  Patient presents with  . Medical Management of Chronic Issues    HPI:  Pt is a 82 y.o. female seen today for medical management of chronic diseases.    Hx of back pain, chronic, on Norco tid and Tylenol q6h prn, HTN controlled on Amlodipine 5mg  and Metoprolol 50mg  bid, GERD asymptomatic, off acid reducer, depression managed with Cymbalta 30mg , weight loss is gradual, constipation controlled on MiraLax prn and Amitizadaily, dementia, last MMSE 21/30 09/25/15, taking Aricept and Namenda to preserve memory, she is functioning well in SNF   Past Medical History:  Diagnosis Date  . Abnormality of gait 09/29/2012  . Allergic rhinitis   . Anemia, unspecified 01/12/2013  . Aortic insufficiency 04/16/2010   a. Echo 2011 - mild to  moderate AI, EF 60%.  . Congestive heart failure, unspecified 06/30/2003  . Dementia   . Descending thoracic aortic dissection (HCC) 04/24/2010   a. MRA 2011 - begins distal to left subclavian and extends throughout the full length of the Ao.  Marland Kitchen. External hemorrhoids without mention of complication 09/29/2012  . Failure to thrive in adult 04/01/2015  . GERD (gastroesophageal reflux disease)   . Hypertension   . Hyposmolality and/or hyponatremia 09/29/2012  . IBS (irritable bowel syndrome)   . Impacted cerumen 09/29/2012  . Lumbago 10/06/2012  . Osteoarthritis    hands  . Osteoporosis   . Other abnormal blood chemistry 01/12/2013  . Otitis externa 09/29/2012  . Palpitation    a. PAC's & PVC's by Holter - 2011  . Personal history of fall 10/06/2012  . Tortuous colon 2002  . Unspecified conductive hearing loss 09/29/2012  . Unspecified constipation 09/29/2012  . Unspecified venous (peripheral) insufficiency 09/29/2012   Past Surgical History:  Procedure Laterality Date  . CATARACT EXTRACTION W/ INTRAOCULAR LENS IMPLANT    . DILATION AND CURETTAGE OF UTERUS  1960    Allergies  Allergen Reactions  . Alendronate Sodium Other (See Comments)    Reaction:  GI upset   . Influenza Vaccines Other (See Comments)    Reaction:  Unknown   . Merthiolate [Thimerosal]       Medication List       Accurate as of 11/04/16  4:31 PM. Always use your most recent med list.          acetaminophen 325 MG  tablet Commonly known as:  TYLENOL Take 650 mg by mouth every 6 (six) hours as needed for mild pain, moderate pain or headache.   amLODipine 5 MG tablet Commonly known as:  NORVASC Take 5 mg by mouth daily.   carbamide peroxide 6.5 % otic solution Commonly known as:  DEBROX Place 5 drops into both ears 2 (two) times daily as needed. For cerumen impaction   DULoxetine 30 MG capsule Commonly known as:  CYMBALTA Take 30 mg by mouth daily.   feeding supplement Liqd Take 1 Container by mouth 3  (three) times daily between meals.   HYDROcodone-acetaminophen 5-325 MG tablet Commonly known as:  NORCO/VICODIN Take 1 tablet by mouth. Take one tablet three times a day for pain   hydroxypropyl methylcellulose / hypromellose 2.5 % ophthalmic solution Commonly known as:  ISOPTO TEARS / GONIOVISC Place 1 drop into both eyes 2 (two) times daily.   lubiprostone 24 MCG capsule Commonly known as:  AMITIZA Take 24 mcg by mouth daily.   metoprolol 50 MG tablet Commonly known as:  LOPRESSOR Take 1 tablet (50 mg total) by mouth 2 (two) times daily.   NAMZARIC 28-10 MG Cp24 Generic drug:  Memantine HCl-Donepezil HCl Take 1 capsule by mouth at bedtime.   polyethylene glycol packet Commonly known as:  MIRALAX / GLYCOLAX Take 17 g by mouth daily as needed for mild constipation or moderate constipation.   simethicone 40 MG/0.6ML drops Commonly known as:  MYLICON Take 0.6 mLs (40 mg total) by mouth 4 (four) times daily as needed for flatulence.       Review of Systems  Constitutional: Negative for activity change, appetite change, chills, diaphoresis, fatigue, fever and unexpected weight change.  HENT: Positive for hearing loss. Negative for congestion, ear discharge, ear pain, postnasal drip, rhinorrhea, sore throat, tinnitus, trouble swallowing and voice change.   Eyes: Negative for photophobia, pain, discharge, redness, itching and visual disturbance.  Respiratory: Negative for cough, choking, shortness of breath and wheezing.   Cardiovascular: Negative for chest pain, palpitations and leg swelling.  Gastrointestinal: Positive for abdominal distention and constipation. Negative for abdominal pain, blood in stool, diarrhea, nausea and vomiting.  Endocrine: Negative for cold intolerance, heat intolerance, polydipsia, polyphagia and polyuria.  Genitourinary: Positive for frequency. Negative for difficulty urinating, dysuria, flank pain, hematuria, pelvic pain, urgency and vaginal  discharge.  Musculoskeletal: Positive for back pain. Negative for arthralgias, gait problem, myalgias, neck pain and neck stiffness.  Skin: Negative for color change, pallor and rash.       Left breast mass  Allergic/Immunologic: Negative.   Neurological: Negative for dizziness, tremors, seizures, syncope, weakness, numbness and headaches.       Dementia  Hematological: Negative for adenopathy. Does not bruise/bleed easily.  Psychiatric/Behavioral: Positive for confusion. Negative for agitation, behavioral problems, dysphoric mood, hallucinations, sleep disturbance and suicidal ideas. The patient is not nervous/anxious and is not hyperactive.        09/25/15 MMSE 21/30    Immunization History  Administered Date(s) Administered  . Influenza Whole 09/02/2005, 09/05/2007, 09/28/2012  . PPD Test 10/15/2013  . Pneumococcal Polysaccharide-23 11/24/2003  . Td 09/29/2002   Pertinent  Health Maintenance Due  Topic Date Due  . PNA vac Low Risk Adult (2 of 2 - PCV13) 11/23/2004  . INFLUENZA VACCINE  09/22/2017 (Originally 06/23/2016)  . DEXA SCAN  Completed   Fall Risk  08/19/2015 04/01/2015 08/31/2013  Falls in the past year? No No Yes  Number falls in past yr: - -  2 or more  Risk for fall due to : - Impaired mobility;Impaired balance/gait Impaired balance/gait   Functional Status Survey:    Vitals:   11/04/16 1338  BP: (!) 147/79  Pulse: 61  Resp: 18  Temp: 98.3 F (36.8 C)  Weight: 104 lb 9.6 oz (47.4 kg)  Height: 5\' 5"  (1.651 m)   Body mass index is 17.41 kg/m. Physical Exam  Constitutional: She appears well-developed and well-nourished. No distress.  HENT:  Head: Normocephalic and atraumatic.  Nose: Nose normal.  Mouth/Throat: Oropharynx is clear and moist. No oropharyngeal exudate.  Eyes: Conjunctivae and EOM are normal. Pupils are equal, round, and reactive to light. Right eye exhibits no discharge. Left eye exhibits no discharge. No scleral icterus.  Neck: Normal range of  motion. Neck supple. No JVD present. No thyromegaly present.  Cardiovascular: Normal rate and regular rhythm.   Murmur heard. Systolic murmur appreciated at the left sternal border 2/6  Pulmonary/Chest: Effort normal and breath sounds normal. No respiratory distress. She has no wheezes. She has no rales.  Left breast mass.  Abdominal: Soft. She exhibits distension. She exhibits no mass. There is no tenderness. There is no rebound and no guarding.  BS x4 quadrants  Musculoskeletal: Normal range of motion. She exhibits tenderness. She exhibits no edema.  Pain in the right shoulder/shoulder blade, neck, middle/lower back-gait instability and frequent falling. C/o aches allover-better since Norco tid. Scoliokyphosis.   Lymphadenopathy:    She has no cervical adenopathy.  Neurological: She is alert. She has normal reflexes. No cranial nerve deficit. She exhibits normal muscle tone. Coordination normal.  Demented  Skin: Skin is warm and dry. No rash noted. She is not diaphoretic. No erythema.  Mobile, smooth, a large marble size, non-tender at left breast 7 o'clock  Psychiatric: Her mood appears not anxious. Her affect is inappropriate. Her affect is not angry, not blunt and not labile. Her speech is delayed. Her speech is not rapid and/or pressured, not tangential and not slurred. She is slowed. She is not agitated, not aggressive, not hyperactive, not withdrawn and not actively hallucinating. Thought content is not paranoid and not delusional. Cognition and memory are impaired. She expresses impulsivity and inappropriate judgment. She does not exhibit a depressed mood. She is communicative. She exhibits abnormal recent memory. She exhibits normal remote memory. She is attentive.    Labs reviewed:  Recent Labs  01/28/16 06/23/16 06/28/16 1549  NA 140 139 138  K 3.8 3.8 4.2  CL  --   --  99*  CO2  --   --  32  GLUCOSE  --   --  96  BUN 26* 22* 32*  CREATININE 0.6 0.6 0.66  CALCIUM  --   --   9.2    Recent Labs  01/28/16 06/23/16 06/28/16 1549  AST 11* 14 14*  ALT 7 9 10*  ALKPHOS 41 50 62  BILITOT  --   --  0.4  PROT  --   --  7.3  ALBUMIN  --   --  3.6    Recent Labs  01/28/16 06/23/16 06/28/16 1549  WBC 6.0 6.0 6.5  NEUTROABS  --   --  4.6  HGB 11.6* 13.4 12.5  HCT 36 40 39.5  MCV  --   --  92.7  PLT 124* 135* 168   Lab Results  Component Value Date   TSH 1.00 06/23/2016   Lab Results  Component Value Date   HGBA1C 5.9 01/17/2013  Lab Results  Component Value Date   CHOL 181 01/07/2010   HDL 93.60 01/07/2010   LDLCALC 78 01/07/2010   TRIG 49.0 01/07/2010   CHOLHDL 2 01/07/2010    Significant Diagnostic Results in last 30 days:  No results found.  Assessment/Plan Hypertension Controlled, continue Amlodipine 5mg  and Metoprolol 50mg  bid   Constipation Stable. Continue MiraLax and Amitiza daily    GERD (gastroesophageal reflux disease) Stable, off PPI   Dementia, vascular SNF for care. Continue Aricept and Namenda. MMSE 22/30 10/26/14, 21/30 09/25/15   OSTEOARTHROSIS, GENERALIZED, MULTIPLE SITES Pain is better controlled, chronic mid back pain in thoracic spine area,  continue Norco  OVERACTIVE BLADDER Adult brief to manage urinary incontinence  Depression with anxiety off Mirtazapine and started Cymbalta 30mg  in setting of aches and pains 03/2015 Mood is stable.       Family/ staff Communication: SNF  Labs/tests ordered:  none

## 2016-11-04 NOTE — Assessment & Plan Note (Signed)
Pain is better controlled, chronic mid back pain in thoracic spine area,  continue Norco

## 2016-11-04 NOTE — Assessment & Plan Note (Signed)
Stable, off PPI  

## 2016-11-04 NOTE — Assessment & Plan Note (Signed)
Stable. Continue MiraLax and Amitiza daily

## 2016-11-04 NOTE — Assessment & Plan Note (Signed)
Adult brief to manage urinary incontinence

## 2016-11-29 DIAGNOSIS — R05 Cough: Secondary | ICD-10-CM | POA: Diagnosis not present

## 2016-12-11 ENCOUNTER — Encounter: Payer: Self-pay | Admitting: Nurse Practitioner

## 2016-12-11 ENCOUNTER — Non-Acute Institutional Stay (SKILLED_NURSING_FACILITY): Payer: Medicare Other | Admitting: Nurse Practitioner

## 2016-12-11 DIAGNOSIS — K219 Gastro-esophageal reflux disease without esophagitis: Secondary | ICD-10-CM | POA: Diagnosis not present

## 2016-12-11 DIAGNOSIS — F0150 Vascular dementia without behavioral disturbance: Secondary | ICD-10-CM | POA: Diagnosis not present

## 2016-12-11 DIAGNOSIS — R634 Abnormal weight loss: Secondary | ICD-10-CM | POA: Diagnosis not present

## 2016-12-11 DIAGNOSIS — M25552 Pain in left hip: Secondary | ICD-10-CM

## 2016-12-11 DIAGNOSIS — F418 Other specified anxiety disorders: Secondary | ICD-10-CM | POA: Diagnosis not present

## 2016-12-11 DIAGNOSIS — N6324 Unspecified lump in the left breast, lower inner quadrant: Secondary | ICD-10-CM | POA: Diagnosis not present

## 2016-12-11 DIAGNOSIS — K5900 Constipation, unspecified: Secondary | ICD-10-CM | POA: Diagnosis not present

## 2016-12-11 DIAGNOSIS — I10 Essential (primary) hypertension: Secondary | ICD-10-CM

## 2016-12-11 NOTE — Assessment & Plan Note (Signed)
Stable. Continue MiraLax prn and Amitiza daily

## 2016-12-11 NOTE — Assessment & Plan Note (Signed)
Watchful waiting.  

## 2016-12-11 NOTE — Assessment & Plan Note (Signed)
Pain is better controlled, chronic mid back pain in thoracic spine area,  continue Norco tid

## 2016-12-11 NOTE — Assessment & Plan Note (Signed)
SNF for care. Continue Aricept and Namenda. MMSE 22/30 10/26/14, 21/30 09/25/15 

## 2016-12-11 NOTE — Assessment & Plan Note (Signed)
Stable, off PPI  

## 2016-12-11 NOTE — Assessment & Plan Note (Signed)
Continue supportive measures, update CBC and CMP

## 2016-12-11 NOTE — Progress Notes (Signed)
Location:  [facility] Room Number: 22 Place of Service:  SNF (31) Provider:  Sharron Simpson, Manxie NP  Murray HodgkinsArthur Green, MD  Patient Care Team: Kimber RelicArthur G Green, MD as PCP - General (Internal Medicine) Laurey Moralealton S McLean, MD as Consulting Physician (Cardiology) Janei Scheff Johnney OuX Mellie Buccellato, NP as Nurse Practitioner (Internal Medicine)  Extended Emergency Contact Information Primary Emergency Contact: Meckel,Charles Address: [address]          [place], [place] [address] [place] Home Phone: [phone] Relation: Son Secondary Emergency Contact: Stuart,Cleleste Address: [address]          [place], [place] [address] [place] Home Phone: [phone] Mobile Phone: [phone] Relation: Daughter  Code Status:  DNR Goals of care: Advanced Directive information Advanced Directives 12/11/2016  Does Patient Have a Medical Advance Directive? Yes  Type of Estate agentAdvance Directive Healthcare Power of [place];Living will  Does patient want to make changes to medical advance directive? No - Patient declined  Copy of Healthcare Power of Attorney in Chart? Yes  Pre-existing out of facility DNR order (yellow form or pink MOST form) -     Chief Complaint  Patient presents with  . Medical Management of Chronic Issues    HPI:  Pt is a 83 y.o. female seen today for medical management of chronic diseases.     Hx of back pain, chronic, on Norco tid and Tylenol q6h prn, HTN controlled on Amlodipine 5mg  and Metoprolol 50mg  bid, GERD asymptomatic, off acid reducer, depression managed with Cymbalta 30mg , weight loss is gradual, constipation controlled on MiraLax prn and Amitizadaily, dementia, last MMSE 21/30 09/25/15, taking Aricept and Namenda to preserve memory, she is functioning well in SNF  Past Medical History:  Diagnosis Date  . Abnormality of gait 09/29/2012  . Allergic rhinitis   . Anemia, unspecified 01/12/2013  . Aortic insufficiency 04/16/2010   a. Echo 2011 -  mild to moderate AI, EF 60%.  . Congestive heart failure, unspecified 06/30/2003  . Dementia   . Descending thoracic aortic dissection (HCC) 04/24/2010   a. MRA 2011 - begins distal to left subclavian and extends throughout the full length of the Ao.  Marland Kitchen. External hemorrhoids without mention of complication 09/29/2012  . Failure to thrive in adult 04/01/2015  . GERD (gastroesophageal reflux disease)   . Hypertension   . Hyposmolality and/or hyponatremia 09/29/2012  . IBS (irritable bowel syndrome)   . Impacted cerumen 09/29/2012  . Lumbago 10/06/2012  . Osteoarthritis    hands  . Osteoporosis   . Other abnormal blood chemistry 01/12/2013  . Otitis externa 09/29/2012  . Palpitation    a. PAC's & PVC's by Holter - 2011  . Personal history of fall 10/06/2012  . Tortuous colon 2002  . Unspecified conductive hearing loss 09/29/2012  . Unspecified constipation 09/29/2012  . Unspecified venous (peripheral) insufficiency 09/29/2012   Past Surgical History:  Procedure Laterality Date  . CATARACT EXTRACTION W/ INTRAOCULAR LENS IMPLANT    . DILATION AND CURETTAGE OF UTERUS  1960    Allergies  Allergen Reactions  . Alendronate Sodium Other (See Comments)    Reaction:  GI upset   . Influenza Vaccines Other (See Comments)    Reaction:  Unknown   . Merthiolate [Thimerosal]     Allergies as of 12/11/2016      Reactions   Alendronate Sodium Other (See Comments)   Reaction:  GI upset    Influenza Vaccines Other (See Comments)   Reaction:  Unknown    Merthiolate [thimerosal]       Medication List       Accurate as of 12/11/16  6:21 PM. Always use your most recent med list.          acetaminophen 325 MG tablet Commonly known as:  TYLENOL Take 650 mg by mouth every 6 (six) hours as needed for mild pain, moderate pain or headache.   amLODipine 5 MG tablet Commonly known as:  NORVASC Take 5 mg by mouth daily.   carbamide peroxide 6.5 % otic solution Commonly known as:  DEBROX Place 5  drops into both ears 2 (two) times daily as needed. For cerumen impaction   DULoxetine 30 MG capsule Commonly known as:  CYMBALTA Take 30 mg by mouth daily.   feeding supplement Liqd Take 1 Container by mouth 3 (three) times daily between meals.   HYDROcodone-acetaminophen 5-325 MG tablet Commonly known as:  NORCO/VICODIN Take 1 tablet by mouth. Take one tablet three times a day for pain   hydroxypropyl methylcellulose / hypromellose 2.5 % ophthalmic solution Commonly known as:  ISOPTO TEARS / GONIOVISC Place 1 drop into both eyes 2 (two) times daily.   lubiprostone 24 MCG capsule Commonly known as:  AMITIZA Take 24 mcg by mouth daily.   metoprolol 50 MG tablet Commonly known as:  LOPRESSOR Take 1 tablet (50 mg total) by mouth 2 (two) times daily.   NAMZARIC 28-10 MG Cp24 Generic drug:  Memantine HCl-Donepezil HCl Take 1 capsule by mouth at bedtime.   polyethylene glycol packet Commonly known as:  MIRALAX / GLYCOLAX Take 17 g by mouth daily as needed for mild constipation or moderate constipation.   simethicone 40 MG/0.6ML drops Commonly known as:  MYLICON Take 0.6 mLs (40 mg total) by mouth 4 (four) times daily as needed for flatulence.       Review of Systems  Constitutional: Negative for activity change, appetite change, chills, diaphoresis, fatigue, fever and unexpected weight change.  HENT: Positive for hearing loss. Negative for congestion, ear discharge, ear pain, postnasal drip, rhinorrhea, sore throat, tinnitus, trouble swallowing and voice change.   Eyes: Negative for photophobia, pain, discharge, redness, itching and visual disturbance.  Respiratory: Negative for cough, choking, shortness of breath and wheezing.   Cardiovascular: Negative for chest pain, palpitations and leg swelling.  Gastrointestinal: Negative for abdominal distention, abdominal pain, blood in stool, constipation, diarrhea, nausea and vomiting.  Endocrine: Negative for cold intolerance,  heat intolerance, polydipsia, polyphagia and polyuria.  Genitourinary: Positive for frequency. Negative for difficulty urinating, dysuria, flank pain, hematuria, pelvic pain, urgency and vaginal discharge.  Musculoskeletal: Positive for back pain. Negative for arthralgias, gait problem, myalgias, neck pain and neck stiffness.  Skin: Negative for color change, pallor and rash.       Left breast mass  Allergic/Immunologic: Negative.   Neurological: Negative for dizziness, tremors, seizures, syncope, weakness, numbness and headaches.       Dementia  Hematological: Negative for adenopathy. Does not bruise/bleed easily.  Psychiatric/Behavioral: Positive for confusion. Negative for agitation, behavioral problems, dysphoric mood, hallucinations, sleep disturbance and suicidal ideas. The patient is not nervous/anxious and is not hyperactive.        09/25/15 MMSE 21/30    Immunization History  Administered Date(s) Administered  . Influenza Whole 09/02/2005, 09/05/2007, 09/28/2012  . PPD Test 10/15/2013  . Pneumococcal Polysaccharide-23 11/24/2003  . Td 09/29/2002   Pertinent  Health Maintenance Due  Topic Date Due  . PNA vac Low Risk Adult (2 of  2 - PCV13) 11/23/2004  . INFLUENZA VACCINE  09/22/2017 (Originally 06/23/2016)  . DEXA SCAN  Completed   Fall Risk  08/19/2015 04/01/2015 08/31/2013  Falls in the past year? No No Yes  Number falls in past yr: - - 2 or more  Risk for fall due to : - Impaired mobility;Impaired balance/gait Impaired balance/gait   Functional Status Survey:    Vitals:   12/11/16 1603  BP: (!) 148/89  Pulse: 79  Resp: 20  Temp: 98.4 F (36.9 C)  Weight: 103 lb 11.2 oz (47 kg)  Height: 5\' 5"  (1.651 m)   Body mass index is 17.26 kg/m. Physical Exam  Constitutional: She appears well-developed and well-nourished. No distress.  HENT:  Head: Normocephalic and atraumatic.  Nose: Nose normal.  Mouth/Throat: Oropharynx is clear and moist. No oropharyngeal exudate.    Eyes: Conjunctivae and EOM are normal. Pupils are equal, round, and reactive to light. Right eye exhibits no discharge. Left eye exhibits no discharge. No scleral icterus.  Neck: Normal range of motion. Neck supple. No JVD present. No thyromegaly present.  Cardiovascular: Normal rate and regular rhythm.   Murmur heard. Systolic murmur appreciated at the left sternal border 2/6  Pulmonary/Chest: Effort normal and breath sounds normal. No respiratory distress. She has no wheezes. She has no rales.  Left breast mass.  Abdominal: Soft. She exhibits no distension and no mass. There is no tenderness. There is no rebound and no guarding.  BS x4 quadrants  Musculoskeletal: Normal range of motion. She exhibits tenderness. She exhibits no edema.  Pain in the right shoulder/shoulder blade, neck, middle/lower back-gait instability and frequent falling. C/o aches allover-better since Norco tid. Scoliokyphosis.   Lymphadenopathy:    She has no cervical adenopathy.  Neurological: She is alert. She has normal reflexes. No cranial nerve deficit. She exhibits normal muscle tone. Coordination normal.  Demented  Skin: Skin is warm and dry. No rash noted. She is not diaphoretic. No erythema.  Mobile, smooth, a large marble size, non-tender at left breast 7 o'clock  Psychiatric: Her mood appears not anxious. Her affect is inappropriate. Her affect is not angry, not blunt and not labile. Her speech is delayed. Her speech is not rapid and/or pressured, not tangential and not slurred. She is slowed. She is not agitated, not aggressive, not hyperactive, not withdrawn and not actively hallucinating. Thought content is not paranoid and not delusional. Cognition and memory are impaired. She expresses impulsivity and inappropriate judgment. She does not exhibit a depressed mood. She is communicative. She exhibits abnormal recent memory. She exhibits normal remote memory. She is attentive.    Labs reviewed:  Recent Labs   01/28/16 06/23/16 06/28/16 1549  NA 140 139 138  K 3.8 3.8 4.2  CL  --   --  99*  CO2  --   --  32  GLUCOSE  --   --  96  BUN 26* 22* 32*  CREATININE 0.6 0.6 0.66  CALCIUM  --   --  9.2    Recent Labs  01/28/16 06/23/16 06/28/16 1549  AST 11* 14 14*  ALT 7 9 10*  ALKPHOS 41 50 62  BILITOT  --   --  0.4  PROT  --   --  7.3  ALBUMIN  --   --  3.6    Recent Labs  01/28/16 06/23/16 06/28/16 1549  WBC 6.0 6.0 6.5  NEUTROABS  --   --  4.6  HGB 11.6* 13.4 12.5  HCT 36  40 39.5  MCV  --   --  92.7  PLT 124* 135* 168   Lab Results  Component Value Date   TSH 1.00 06/23/2016   Lab Results  Component Value Date   HGBA1C 5.9 01/17/2013   Lab Results  Component Value Date   CHOL 181 01/07/2010   HDL 93.60 01/07/2010   LDLCALC 78 01/07/2010   TRIG 49.0 01/07/2010   CHOLHDL 2 01/07/2010    Significant Diagnostic Results in last 30 days:  No results found.  Assessment/Plan Hypertension Controlled, continue Amlodipine 5mg  and Metoprolol 50mg  bid, update CMP   Constipation Stable. Continue MiraLax prn and Amitiza daily    GERD (gastroesophageal reflux disease) Stable, off PPI   Dementia, vascular SNF for care. Continue Aricept and Namenda. MMSE 22/30 10/26/14, 21/30 09/25/15   Depression with anxiety off Mirtazapine and started Cymbalta 30mg  in setting of aches and pains 03/2015 Mood is stable.    Breast lump on left side at 7 o'clock position Watchful waiting.   Left hip pain Pain is better controlled, chronic mid back pain in thoracic spine area,  continue Norco tid  Loss of weight Continue supportive measures, update CBC and CMP     Family/ staff Communication: SNF  Labs/tests ordered:  CBC CMP

## 2016-12-11 NOTE — Assessment & Plan Note (Signed)
Controlled, continue Amlodipine 5mg and Metoprolol 50mg bid, update CMP 

## 2016-12-11 NOTE — Assessment & Plan Note (Signed)
off Mirtazapine and started Cymbalta 30mg in setting of aches and pains 03/2015 Mood is stable.  

## 2016-12-15 DIAGNOSIS — I10 Essential (primary) hypertension: Secondary | ICD-10-CM | POA: Diagnosis not present

## 2017-01-11 ENCOUNTER — Non-Acute Institutional Stay (SKILLED_NURSING_FACILITY): Payer: Medicare Other | Admitting: Nurse Practitioner

## 2017-01-11 ENCOUNTER — Encounter: Payer: Self-pay | Admitting: Nurse Practitioner

## 2017-01-11 DIAGNOSIS — K5900 Constipation, unspecified: Secondary | ICD-10-CM

## 2017-01-11 DIAGNOSIS — M159 Polyosteoarthritis, unspecified: Secondary | ICD-10-CM | POA: Diagnosis not present

## 2017-01-11 DIAGNOSIS — F418 Other specified anxiety disorders: Secondary | ICD-10-CM

## 2017-01-11 DIAGNOSIS — R627 Adult failure to thrive: Secondary | ICD-10-CM

## 2017-01-11 DIAGNOSIS — K219 Gastro-esophageal reflux disease without esophagitis: Secondary | ICD-10-CM

## 2017-01-11 DIAGNOSIS — F0150 Vascular dementia without behavioral disturbance: Secondary | ICD-10-CM

## 2017-01-11 DIAGNOSIS — I10 Essential (primary) hypertension: Secondary | ICD-10-CM | POA: Diagnosis not present

## 2017-01-11 NOTE — Assessment & Plan Note (Signed)
Continue supportive measures 

## 2017-01-11 NOTE — Assessment & Plan Note (Signed)
Stable, off PPI  

## 2017-01-11 NOTE — Progress Notes (Signed)
Location:  [facility] Room Number: 22 Place of Service:  SNF (31) Provider:  Mkenzie Dotts, Manxie  NP  Murray Hodgkins, MD  Patient Care Team: Kimber Relic, MD as PCP - General (Internal Medicine) Laurey Morale, MD as Consulting Physician (Cardiology) Naylea Wigington Johnney Ou, NP as Nurse Practitioner (Internal Medicine)  Extended Emergency Contact Information Primary Emergency Contact: Dinning,Charles Address: [address]          [place], [place] [address] [place] Home Phone: [phone] Relation: Son Secondary Emergency Contact: Stuart,Cleleste Address: [address]          [place], [place] [address] [place] Home Phone: [phone] Mobile Phone: [phone] Relation: Daughter  Code Status:  DNR Goals of care: Advanced Directive information Advanced Directives 01/11/2017  Does Patient Have a Medical Advance Directive? Yes  Type of Estate agent of [place];Living will;Out of facility DNR (pink MOST or yellow form)  Does patient want to make changes to medical advance directive? No - Patient declined  Copy of Healthcare Power of Attorney in Chart? Yes  Pre-existing out of facility DNR order (yellow form or pink MOST form) Yellow form placed in chart (order not valid for inpatient use)     Chief Complaint  Patient presents with  . Medical Management of Chronic Issues    HPI:  Pt is a 83 y.o. female seen today for medical management of chronic diseases.     Hx of back pain, chronic, on Norco tidand Tylenol q6h prn, HTN controlled on Amlodipine 5mg  and Metoprolol 50mg  bid, GERD asymptomatic, off acid reducer, depression managed with Cymbalta 30mg , weight loss is gradual, constipation controlled on MiraLax prn and Amitizadaily, dementia, last MMSE 21/30 09/25/15, taking Aricept and Namenda to preserve memory, she is functioning well in SNF   Past Medical History:  Diagnosis Date  . Abnormality of gait  09/29/2012  . Allergic rhinitis   . Anemia, unspecified 01/12/2013  . Aortic insufficiency 04/16/2010   a. Echo 2011 - mild to moderate AI, EF 60%.  . Congestive heart failure, unspecified 06/30/2003  . Dementia   . Descending thoracic aortic dissection (HCC) 04/24/2010   a. MRA 2011 - begins distal to left subclavian and extends throughout the full length of the Ao.  Marland Kitchen External hemorrhoids without mention of complication 09/29/2012  . Failure to thrive in adult 04/01/2015  . GERD (gastroesophageal reflux disease)   . Hypertension   . Hyposmolality and/or hyponatremia 09/29/2012  . IBS (irritable bowel syndrome)   . Impacted cerumen 09/29/2012  . Lumbago 10/06/2012  . Osteoarthritis    hands  . Osteoporosis   . Other abnormal blood chemistry 01/12/2013  . Otitis externa 09/29/2012  . Palpitation    a. PAC's & PVC's by Holter - 2011  . Personal history of fall 10/06/2012  . Tortuous colon 2002  . Unspecified conductive hearing loss 09/29/2012  . Unspecified constipation 09/29/2012  . Unspecified venous (peripheral) insufficiency 09/29/2012   Past Surgical History:  Procedure Laterality Date  . CATARACT EXTRACTION W/ INTRAOCULAR LENS IMPLANT    . DILATION AND CURETTAGE OF UTERUS  1960    Allergies  Allergen Reactions  . Alendronate Sodium Other (See Comments)    Reaction:  GI upset   . Influenza Vaccines Other (See Comments)    Reaction:  Unknown   . Merthiolate [Thimerosal]     Allergies as of 01/11/2017      Reactions   Alendronate Sodium Other (  See Comments)   Reaction:  GI upset    Influenza Vaccines Other (See Comments)   Reaction:  Unknown    Merthiolate [thimerosal]       Medication List       Accurate as of 01/11/17  1:17 PM. Always use your most recent med list.          acetaminophen 325 MG tablet Commonly known as:  TYLENOL Take 650 mg by mouth every 6 (six) hours as needed for mild pain, moderate pain or headache.   amLODipine 5 MG tablet Commonly known as:   NORVASC Take 5 mg by mouth daily.   carbamide peroxide 6.5 % otic solution Commonly known as:  DEBROX Place 5 drops into both ears 2 (two) times daily as needed. For cerumen impaction   DULoxetine 30 MG capsule Commonly known as:  CYMBALTA Take 30 mg by mouth daily.   feeding supplement Liqd Take 1 Container by mouth 3 (three) times daily between meals.   HYDROcodone-acetaminophen 5-325 MG tablet Commonly known as:  NORCO/VICODIN Take 1 tablet by mouth. Take one tablet three times a day for pain   hydroxypropyl methylcellulose / hypromellose 2.5 % ophthalmic solution Commonly known as:  ISOPTO TEARS / GONIOVISC Place 1 drop into both eyes 2 (two) times daily.   lubiprostone 24 MCG capsule Commonly known as:  AMITIZA Take 24 mcg by mouth daily.   metoprolol 50 MG tablet Commonly known as:  LOPRESSOR Take 1 tablet (50 mg total) by mouth 2 (two) times daily.   NAMZARIC 28-10 MG Cp24 Generic drug:  Memantine HCl-Donepezil HCl Take 1 capsule by mouth at bedtime.   polyethylene glycol packet Commonly known as:  MIRALAX / GLYCOLAX Take 17 g by mouth daily as needed for mild constipation or moderate constipation.   simethicone 40 MG/0.6ML drops Commonly known as:  MYLICON Take 0.6 mLs (40 mg total) by mouth 4 (four) times daily as needed for flatulence.       Review of Systems  Constitutional: Negative for activity change, appetite change, chills, diaphoresis, fatigue, fever and unexpected weight change.  HENT: Positive for hearing loss. Negative for congestion, ear discharge, ear pain, postnasal drip, rhinorrhea, sore throat, tinnitus, trouble swallowing and voice change.   Eyes: Negative for photophobia, pain, discharge, redness, itching and visual disturbance.  Respiratory: Negative for cough, choking, shortness of breath and wheezing.   Cardiovascular: Negative for chest pain, palpitations and leg swelling.  Gastrointestinal: Negative for abdominal distention,  abdominal pain, blood in stool, constipation, diarrhea, nausea and vomiting.  Endocrine: Negative for cold intolerance, heat intolerance, polydipsia, polyphagia and polyuria.  Genitourinary: Positive for frequency. Negative for difficulty urinating, dysuria, flank pain, hematuria, pelvic pain, urgency and vaginal discharge.  Musculoskeletal: Positive for back pain. Negative for arthralgias, gait problem, myalgias, neck pain and neck stiffness.  Skin: Negative for color change, pallor and rash.       Left breast mass  Allergic/Immunologic: Negative.   Neurological: Negative for dizziness, tremors, seizures, syncope, weakness, numbness and headaches.       Dementia  Hematological: Negative for adenopathy. Does not bruise/bleed easily.  Psychiatric/Behavioral: Positive for confusion. Negative for agitation, behavioral problems, dysphoric mood, hallucinations, sleep disturbance and suicidal ideas. The patient is not nervous/anxious and is not hyperactive.        09/25/15 MMSE 21/30    Immunization History  Administered Date(s) Administered  . Influenza Whole 09/02/2005, 09/05/2007, 09/28/2012  . PPD Test 10/15/2013  . Pneumococcal Polysaccharide-23 11/24/2003  . Td 09/29/2002  Pertinent  Health Maintenance Due  Topic Date Due  . PNA vac Low Risk Adult (2 of 2 - PCV13) 11/23/2004  . INFLUENZA VACCINE  09/22/2017 (Originally 06/23/2016)  . DEXA SCAN  Completed   Fall Risk  08/19/2015 04/01/2015 08/31/2013  Falls in the past year? No No Yes  Number falls in past yr: - - 2 or more  Risk for fall due to : - Impaired mobility;Impaired balance/gait Impaired balance/gait   Functional Status Survey:    Vitals:   01/11/17 1034  BP: 134/68  Pulse: 66  Resp: 18  Temp: 98.8 F (37.1 C)  Weight: 100 lb 3.2 oz (45.5 kg)  Height: 5\' 5"  (1.651 m)   Body mass index is 16.67 kg/m. Physical Exam  Constitutional: She appears well-developed and well-nourished. No distress.  HENT:  Head:  Normocephalic and atraumatic.  Nose: Nose normal.  Mouth/Throat: Oropharynx is clear and moist. No oropharyngeal exudate.  Eyes: Conjunctivae and EOM are normal. Pupils are equal, round, and reactive to light. Right eye exhibits no discharge. Left eye exhibits no discharge. No scleral icterus.  Neck: Normal range of motion. Neck supple. No JVD present. No thyromegaly present.  Cardiovascular: Normal rate and regular rhythm.   Murmur heard. Systolic murmur appreciated at the left sternal border 2/6  Pulmonary/Chest: Effort normal and breath sounds normal. No respiratory distress. She has no wheezes. She has no rales.  Left breast mass.  Abdominal: Soft. She exhibits no distension and no mass. There is no tenderness. There is no rebound and no guarding.  BS x4 quadrants  Musculoskeletal: Normal range of motion. She exhibits tenderness. She exhibits no edema.  Pain in the right shoulder/shoulder blade, neck, middle/lower back-gait instability and frequent falling. C/o aches allover-better since Norco tid. Scoliokyphosis.   Lymphadenopathy:    She has no cervical adenopathy.  Neurological: She is alert. She has normal reflexes. No cranial nerve deficit. She exhibits normal muscle tone. Coordination normal.  Demented  Skin: Skin is warm and dry. No rash noted. She is not diaphoretic. No erythema.  Mobile, smooth, a large marble size, non-tender at left breast 7 o'clock  Psychiatric: Her mood appears not anxious. Her affect is inappropriate. Her affect is not angry, not blunt and not labile. Her speech is delayed. Her speech is not rapid and/or pressured, not tangential and not slurred. She is slowed. She is not agitated, not aggressive, not hyperactive, not withdrawn and not actively hallucinating. Thought content is not paranoid and not delusional. Cognition and memory are impaired. She expresses impulsivity and inappropriate judgment. She does not exhibit a depressed mood. She is communicative. She  exhibits abnormal recent memory. She exhibits normal remote memory. She is attentive.    Labs reviewed:  Recent Labs  01/28/16 06/23/16 06/28/16 1549  NA 140 139 138  K 3.8 3.8 4.2  CL  --   --  99*  CO2  --   --  32  GLUCOSE  --   --  96  BUN 26* 22* 32*  CREATININE 0.6 0.6 0.66  CALCIUM  --   --  9.2    Recent Labs  01/28/16 06/23/16 06/28/16 1549  AST 11* 14 14*  ALT 7 9 10*  ALKPHOS 41 50 62  BILITOT  --   --  0.4  PROT  --   --  7.3  ALBUMIN  --   --  3.6    Recent Labs  01/28/16 06/23/16 06/28/16 1549  WBC 6.0 6.0 6.5  NEUTROABS  --   --  4.6  HGB 11.6* 13.4 12.5  HCT 36 40 39.5  MCV  --   --  92.7  PLT 124* 135* 168   Lab Results  Component Value Date   TSH 1.00 06/23/2016   Lab Results  Component Value Date   HGBA1C 5.9 01/17/2013   Lab Results  Component Value Date   CHOL 181 01/07/2010   HDL 93.60 01/07/2010   LDLCALC 78 01/07/2010   TRIG 49.0 01/07/2010   CHOLHDL 2 01/07/2010    Significant Diagnostic Results in last 30 days:  No results found.  Assessment/Plan Hypertension Controlled, continue Amlodipine 5mg  and Metoprolol 50mg  bid   Constipation Stable. Continue MiraLax prn and Amitiza daily   GERD (gastroesophageal reflux disease) Stable, off PPI  Dementia, vascular SNF for care. Continue Aricept and Namenda. MMSE 22/30 10/26/14, 21/30 09/25/15    OSTEOARTHROSIS, GENERALIZED, MULTIPLE SITES Pain is better controlled, chronic mid back pain in thoracic spine area,  continue Norco 5/374m tid  Depression with anxiety Mood is stable since.off Mirtazapine and started Cymbalta 30mg  in setting of aches and pains 03/2015   Adult failure to thrive Continue supportive measures.      Family/ staff Communication:SNF  Labs/tests ordered: CBC CMP

## 2017-01-11 NOTE — Assessment & Plan Note (Signed)
Mood is stable since.off Mirtazapine and started Cymbalta 30mg  in setting of aches and pains 03/2015

## 2017-01-11 NOTE — Assessment & Plan Note (Signed)
SNF for care. Continue Aricept and Namenda. MMSE 22/30 10/26/14, 21/30 09/25/15 

## 2017-01-11 NOTE — Assessment & Plan Note (Signed)
Pain is better controlled, chronic mid back pain in thoracic spine area,  continue Norco 5/37129m tid

## 2017-01-11 NOTE — Assessment & Plan Note (Signed)
Stable. Continue MiraLax prn and Amitiza daily

## 2017-01-11 NOTE — Assessment & Plan Note (Signed)
Controlled, continue Amlodipine 5mg and Metoprolol 50mg bid. 

## 2017-01-12 DIAGNOSIS — I10 Essential (primary) hypertension: Secondary | ICD-10-CM | POA: Diagnosis not present

## 2017-01-12 LAB — CBC AND DIFFERENTIAL
HCT: 37 % (ref 36–46)
HEMOGLOBIN: 12.2 g/dL (ref 12.0–16.0)
Platelets: 144 10*3/uL — AB (ref 150–399)
WBC: 7 10^3/mL

## 2017-01-12 LAB — HEPATIC FUNCTION PANEL
ALT: 8 U/L (ref 7–35)
AST: 11 U/L — AB (ref 13–35)
Alkaline Phosphatase: 59 U/L (ref 25–125)
Bilirubin, Total: 0.6 mg/dL

## 2017-01-12 LAB — BASIC METABOLIC PANEL
BUN: 22 mg/dL — AB (ref 4–21)
CREATININE: 0.7 mg/dL (ref <=1.1)
GLUCOSE: 83 mg/dL
POTASSIUM: 4.1 mmol/L (ref 3.4–5.3)
SODIUM: 137 mmol/L (ref 137–147)

## 2017-01-13 ENCOUNTER — Other Ambulatory Visit: Payer: Self-pay | Admitting: *Deleted

## 2017-01-28 ENCOUNTER — Encounter: Payer: Self-pay | Admitting: Internal Medicine

## 2017-01-28 ENCOUNTER — Non-Acute Institutional Stay (SKILLED_NURSING_FACILITY): Payer: Medicare Other | Admitting: Internal Medicine

## 2017-01-28 DIAGNOSIS — R627 Adult failure to thrive: Secondary | ICD-10-CM

## 2017-01-28 DIAGNOSIS — F418 Other specified anxiety disorders: Secondary | ICD-10-CM

## 2017-01-28 DIAGNOSIS — F0150 Vascular dementia without behavioral disturbance: Secondary | ICD-10-CM

## 2017-01-28 DIAGNOSIS — R634 Abnormal weight loss: Secondary | ICD-10-CM

## 2017-01-28 DIAGNOSIS — K589 Irritable bowel syndrome without diarrhea: Secondary | ICD-10-CM

## 2017-01-28 DIAGNOSIS — N6324 Unspecified lump in the left breast, lower inner quadrant: Secondary | ICD-10-CM

## 2017-01-28 NOTE — Progress Notes (Signed)
Progress Note    Location:  [facility] Room Number: N22 Place of Service:  SNF [phone](31) Provider:  Murray HodgkinsArthur Green, MD  Patient Care Team: Kimber RelicArthur G Green, MD as PCP - General (Internal Medicine) Laurey Moralealton S McLean, MD as Consulting Physician (Cardiology) Man Johnney OuX Mast, NP as Nurse Practitioner (Internal Medicine)  Extended Emergency Contact Information Primary Emergency Contact: Hopman,Charles Address: [address]          [place], [place] [address] [place] Home Phone: [phone] Relation: Son Secondary Emergency Contact: Stuart,Cleleste Address: [address]          [place], [place] [address] [place] Home Phone: [phone] Mobile Phone: [phone] Relation: Daughter  Code Status:  DNR Goals of care: Advanced Directive information Advanced Directives 01/28/2017  Does Patient Have a Medical Advance Directive? Yes  Type of Estate agentAdvance Directive Healthcare Power of [place];Out of facility DNR (pink MOST or yellow form)  Does patient want to make changes to medical advance directive? -  Copy of Healthcare Power of Attorney in Chart? Yes  Pre-existing out of facility DNR order (yellow form or pink MOST form) Yellow form placed in chart (order not valid for inpatient use)     Chief Complaint  Patient presents with  . Medical Management of Chronic Issues    routine    HPI:  Pt is a 83 y.o. female seen today for medical management of chronic diseases.    Breast lump on left side at 7 o'clock position - chronic since at least 2009. US breast 11/12/2008 showed a "stable, probably benign density in the left ineer lower quadrant". 07/05/14 POA declined Mammogram. Desires no IVF. ABT for comfort measures only.   Vascular dementia without behavioral disturbance - unchanged  Depression with anxiety -- stable  Irritable bowel syndrome, unspecified type - accompanied by bloating, constipation, and discomfort.     Past Medical  History:  Diagnosis Date  . Abnormality of gait 09/29/2012  . Allergic rhinitis   . Anemia, unspecified 01/12/2013  . Aortic insufficiency 04/16/2010   a. Echo 2011 - mild to moderate AI, EF 60%.  . Breast lump on left side at 7 o'clock position 07/02/2014   07/05/14 POA declined Mammogram. Desires no IVF. ABT for comfort measures only.    . Congestive heart failure, unspecified 06/30/2003  . Dementia   . Descending thoracic aortic dissection (HCC) 04/24/2010   a. MRA 2011 - begins distal to left subclavian and extends throughout the full length of the Ao.  Marland Kitchen. External hemorrhoids without mention of complication 09/29/2012  . Failure to thrive in adult 04/01/2015  . GERD (gastroesophageal reflux disease)   . Hypertension   . Hyposmolality and/or hyponatremia 09/29/2012  . IBS (irritable bowel syndrome)   . Impacted cerumen 09/29/2012  . Lumbago 10/06/2012  . Osteoarthritis    hands  . Osteoporosis   . Other abnormal blood chemistry 01/12/2013  . Otitis externa 09/29/2012  . Palpitation    a. PAC's & PVC's by Holter - 2011  . Personal history of fall 10/06/2012  . Tortuous colon 2002  . Unspecified conductive hearing loss 09/29/2012  . Unspecified constipation 09/29/2012  . Unspecified venous (peripheral) insufficiency 09/29/2012   Past Surgical History:  Procedure Laterality Date  . CATARACT EXTRACTION W/ INTRAOCULAR LENS IMPLANT    . DILATION AND CURETTAGE OF UTERUS  1960    Allergies  Allergen Reactions  . Alendronate Sodium Other (See Comments)    Reaction:  GI upset   . Influenza Vaccines Other (See Comments)    Reaction:  Unknown   . Merthiolate [Thimerosal]     Allergies as of 01/28/2017      Reactions   Alendronate Sodium Other (See Comments)   Reaction:  GI upset    Influenza Vaccines Other (See Comments)   Reaction:  Unknown    Merthiolate [thimerosal]       Medication List       Accurate as of 01/28/17 11:16 AM. Always use your most recent med list.            acetaminophen 325 MG tablet Commonly known as:  TYLENOL Take 650 mg by mouth every 6 (six) hours as needed for mild pain, moderate pain or headache.   amLODipine 5 MG tablet Commonly known as:  NORVASC Take 5 mg by mouth daily.   ARTIFICIAL TEARS OP Apply to eye. One drop in both eyes twice daily   DULoxetine 30 MG capsule Commonly known as:  CYMBALTA Take 30 mg by mouth daily.   feeding supplement Liqd Take 1 Container by mouth 3 (three) times daily between meals.   HYDROcodone-acetaminophen 5-325 MG tablet Commonly known as:  NORCO/VICODIN Take 1 tablet by mouth. Take one tablet three times a day for pain   lubiprostone 24 MCG capsule Commonly known as:  AMITIZA Take 24 mcg by mouth daily.   metoprolol 50 MG tablet Commonly known as:  LOPRESSOR Take 1 tablet (50 mg total) by mouth 2 (two) times daily.   NAMZARIC 28-10 MG Cp24 Generic drug:  Memantine HCl-Donepezil HCl Take 1 capsule by mouth at bedtime.   polyethylene glycol packet Commonly known as:  MIRALAX / GLYCOLAX Take 17 g by mouth daily as needed for mild constipation or moderate constipation.   simethicone 40 MG/0.6ML drops Commonly known as:  MYLICON Take 0.6 mLs (40 mg total) by mouth 4 (four) times daily as needed for flatulence.       Review of Systems  Constitutional: Negative for activity change, appetite change, chills, diaphoresis, fatigue, fever and unexpected weight change.       Left breast mass since at least 2009.  HENT: Positive for hearing loss. Negative for congestion, ear discharge, ear pain, postnasal drip, rhinorrhea, sore throat, tinnitus, trouble swallowing and voice change.   Eyes: Negative for photophobia, pain, discharge, redness, itching and visual disturbance.  Respiratory: Negative for cough, choking, shortness of breath and wheezing.   Cardiovascular: Negative for chest pain, palpitations and leg swelling.  Gastrointestinal: Positive for abdominal distention, abdominal pain  and constipation. Negative for blood in stool, diarrhea, nausea and vomiting.  Endocrine: Negative for cold intolerance, heat intolerance, polydipsia, polyphagia and polyuria.  Genitourinary: Positive for frequency. Negative for difficulty urinating, dysuria, flank pain, hematuria, pelvic pain, urgency and vaginal discharge.  Musculoskeletal: Positive for back pain. Negative for arthralgias, gait problem, myalgias, neck pain and neck stiffness.  Skin: Negative for color change, pallor and rash.       Left breast mass  Allergic/Immunologic: Negative.   Neurological: Negative for dizziness, tremors, seizures, syncope, weakness, numbness and headaches.       Dementia  Hematological: Negative for adenopathy. Does not bruise/bleed easily.  Psychiatric/Behavioral: Positive for confusion. Negative for agitation, behavioral problems, dysphoric mood, hallucinations, sleep disturbance and suicidal ideas. The patient is not nervous/anxious and is not hyperactive.        09/25/15 MMSE 21/30    Immunization History  Administered Date(s) Administered  . Influenza Whole 09/02/2005, 09/05/2007,  09/28/2012  . PPD Test 10/15/2013  . Pneumococcal Polysaccharide-23 11/24/2003  . Td 09/29/2002   Pertinent  Health Maintenance Due  Topic Date Due  . PNA vac Low Risk Adult (2 of 2 - PCV13) 11/23/2004  . INFLUENZA VACCINE  09/22/2017 (Originally 06/23/2016)  . DEXA SCAN  Completed   Fall Risk  08/19/2015 04/01/2015 08/31/2013  Falls in the past year? No No Yes  Number falls in past yr: - - 2 or more  Risk for fall due to : - Impaired mobility;Impaired balance/gait Impaired balance/gait   Functional Status Survey:    Vitals:   01/28/17 1104  BP: 131/85  Pulse: 61  Resp: 20  Temp: 98.7 F (37.1 C)  Weight: 101 lb (45.8 kg)  Height: 5\' 5"  (1.651 m)   Body mass index is 16.81 kg/m. Physical Exam  Constitutional: She appears well-developed and well-nourished. No distress.  HENT:  Head: Normocephalic and  atraumatic.  Nose: Nose normal.  Mouth/Throat: Oropharynx is clear and moist. No oropharyngeal exudate.  Eyes: Conjunctivae and EOM are normal. Pupils are equal, round, and reactive to light. Right eye exhibits no discharge. Left eye exhibits no discharge. No scleral icterus.  Neck: Normal range of motion. Neck supple. No JVD present. No thyromegaly present.  Cardiovascular: Normal rate and regular rhythm.   Murmur heard. Systolic murmur appreciated at the left sternal border 2/6  Pulmonary/Chest: Effort normal and breath sounds normal. No respiratory distress. She has no wheezes. She has no rales.  Left breast mass.  Abdominal: Soft. She exhibits no distension and no mass. There is no tenderness. There is no rebound and no guarding.  BS x4 quadrants  Musculoskeletal: Normal range of motion. She exhibits tenderness. She exhibits no edema.  Pain in the right shoulder/shoulder blade, neck, middle/lower back-gait instability and frequent falling. C/o aches allover-better since Norco tid. Scoliokyphosis.   Lymphadenopathy:    She has no cervical adenopathy.  Neurological: She is alert. She has normal reflexes. No cranial nerve deficit. She exhibits normal muscle tone. Coordination normal.  Demented  Skin: Skin is warm and dry. No rash noted. She is not diaphoretic. No erythema.  Mobile, smooth, a large marble size, non-tender at left breast 7 o'clock  Psychiatric: Her mood appears not anxious. Her affect is inappropriate. Her affect is not angry, not blunt and not labile. Her speech is delayed. Her speech is not rapid and/or pressured, not tangential and not slurred. She is slowed. She is not agitated, not aggressive, not hyperactive, not withdrawn and not actively hallucinating. Thought content is not paranoid and not delusional. Cognition and memory are impaired. She expresses impulsivity and inappropriate judgment. She does not exhibit a depressed mood. She is communicative. She exhibits abnormal  recent memory. She exhibits normal remote memory. She is attentive.    Labs reviewed:  Recent Labs  06/23/16 06/28/16 1549 01/12/17  NA 139 138 137  K 3.8 4.2 4.1  CL  --  99*  --   CO2  --  32  --   GLUCOSE  --  96  --   BUN 22* 32* 22*  CREATININE 0.6 0.66 0.7  CALCIUM  --  9.2  --     Recent Labs  06/23/16 06/28/16 1549 01/12/17  AST 14 14* 11*  ALT 9 10* 8  ALKPHOS 50 62 59  BILITOT  --  0.4  --   PROT  --  7.3  --   ALBUMIN  --  3.6  --  Recent Labs  06/23/16 06/28/16 1549 01/12/17  WBC 6.0 6.5 7.0  NEUTROABS  --  4.6  --   HGB 13.4 12.5 12.2  HCT 40 39.5 37  MCV  --  92.7  --   PLT 135* 168 144*   Lab Results  Component Value Date   TSH 1.00 06/23/2016   Lab Results  Component Value Date   HGBA1C 5.9 01/17/2013   Lab Results  Component Value Date   CHOL 181 01/07/2010   HDL 93.60 01/07/2010   LDLCALC 78 01/07/2010   TRIG 49.0 01/07/2010   CHOLHDL 2 01/07/2010    Significant Diagnostic Results in last 30 days:  No results found.  Assessment/Plan 1. Breast lump on left side at 7 o'clock position Continue to follow.  2. Vascular dementia without behavioral disturbance unchanged  3. Depression with anxiety stable  4. Irritable bowel syndrome, unspecified type May be acting up again  5. Loss of weight 9# since May 2017.  6. Adult failure to thrive Overall poor condition.

## 2017-03-01 ENCOUNTER — Non-Acute Institutional Stay (SKILLED_NURSING_FACILITY): Payer: Medicare Other | Admitting: Nurse Practitioner

## 2017-03-01 ENCOUNTER — Encounter: Payer: Self-pay | Admitting: Nurse Practitioner

## 2017-03-01 DIAGNOSIS — I10 Essential (primary) hypertension: Secondary | ICD-10-CM

## 2017-03-01 DIAGNOSIS — F418 Other specified anxiety disorders: Secondary | ICD-10-CM

## 2017-03-01 DIAGNOSIS — N318 Other neuromuscular dysfunction of bladder: Secondary | ICD-10-CM

## 2017-03-01 DIAGNOSIS — K219 Gastro-esophageal reflux disease without esophagitis: Secondary | ICD-10-CM

## 2017-03-01 DIAGNOSIS — K5900 Constipation, unspecified: Secondary | ICD-10-CM

## 2017-03-01 DIAGNOSIS — F0150 Vascular dementia without behavioral disturbance: Secondary | ICD-10-CM

## 2017-03-01 NOTE — Progress Notes (Signed)
Location:  [facility] Room Number: 22 Place of Service:  SNF (31) Provider:  Kayde Warehime, Manxie  NP  Murray Hodgkins, MD  Patient Care Team: Kimber Relic, MD as PCP - General (Internal Medicine) Laurey Morale, MD as Consulting Physician (Cardiology) Aubrianna Orchard Johnney Ou, NP as Nurse Practitioner (Internal Medicine)  Extended Emergency Contact Information Primary Emergency Contact: Geisel,Charles Address: [address]          [place], [place] [address] [place] Home Phone: [phone] Relation: Son Secondary Emergency Contact: Stuart,Cleleste Address: [address]          [place], [place] [address] [place] Home Phone: [phone] Mobile Phone: [phone] Relation: Daughter  Code Status:  DNR Goals of care: Advanced Directive information Advanced Directives 03/01/2017  Does Patient Have a Medical Advance Directive? Yes  Type of Estate agent of [place];Out of facility DNR (pink MOST or yellow form)  Does patient want to make changes to medical advance directive? No - Patient declined  Copy of Healthcare Power of Attorney in Chart? Yes  Pre-existing out of facility DNR order (yellow form or pink MOST form) Yellow form placed in chart (order not valid for inpatient use)     Chief Complaint  Patient presents with  . Medical Management of Chronic Issues    HPI:  Pt is a 83 y.o. female seen today for medical management of chronic diseases.      Hx of back pain, chronic, on Norco tidand Tylenol q6h prn, HTN controlled on Amlodipine  and Metoprolol  bid, GERD asymptomatic, off acid reducer, depression managed with Cymbalta , weight loss is gradual, constipation controlled on MiraLax prn and Amitizadaily, dementia, last MMSE 21/30 09/25/15, taking Aricept and Namenda to preserve memory, she is functioning well in SNF    Past Medical History:  Diagnosis Date  . Abnormality of gait 09/29/2012  .  Allergic rhinitis   . Anemia, unspecified 01/12/2013  . Aortic insufficiency 04/16/2010   a. Echo 2011 - mild to moderate AI, EF 60%.  . Breast lump on left side at 7 o'clock position 07/02/2014   07/05/14 POA declined Mammogram. Desires no IVF. ABT for comfort measures only.    . Congestive heart failure, unspecified 06/30/2003  . Dementia   . Descending thoracic aortic dissection (HCC) 04/24/2010   a. MRA 2011 - begins distal to left subclavian and extends throughout the full length of the Ao.  Marland Kitchen External hemorrhoids without mention of complication 09/29/2012  . Failure to thrive in adult 04/01/2015  . GERD (gastroesophageal reflux disease)   . Hypertension   . Hyposmolality and/or hyponatremia 09/29/2012  . IBS (irritable bowel syndrome)   . Impacted cerumen 09/29/2012  . Lumbago 10/06/2012  . Osteoarthritis    hands  . Osteoporosis   . Other abnormal blood chemistry 01/12/2013  . Otitis externa 09/29/2012  . Palpitation    a. PAC's & PVC's by Holter - 2011  . Personal history of fall 10/06/2012  . Tortuous colon 2002  . Unspecified conductive hearing loss 09/29/2012  . Unspecified constipation 09/29/2012  . Unspecified venous (peripheral) insufficiency 09/29/2012   Past Surgical History:  Procedure Laterality Date  . CATARACT EXTRACTION W/ INTRAOCULAR LENS IMPLANT    . DILATION AND CURETTAGE OF UTERUS  1960    Allergies  Allergen Reactions  . Alendronate Sodium Other (See Comments)    Reaction:  GI upset   . Influenza Vaccines Other (See Comments)  Reaction:  Unknown   . Merthiolate [Thimerosal]     Allergies as of 03/01/2017      Reactions   Alendronate Sodium Other (See Comments)   Reaction:  GI upset    Influenza Vaccines Other (See Comments)   Reaction:  Unknown    Merthiolate [thimerosal]       Medication List       Accurate as of 03/01/17 11:06 AM. Always use your most recent med list.          acetaminophen 325 MG tablet Commonly known as:  TYLENOL Take 650  mg by mouth every 6 (six) hours as needed for mild pain, moderate pain or headache.   amLODipine 5 MG tablet Commonly known as:  NORVASC Take 5 mg by mouth daily.   ARTIFICIAL TEARS OP Apply to eye. One drop in both eyes twice daily   DULoxetine 30 MG capsule Commonly known as:  CYMBALTA Take 30 mg by mouth daily.   feeding supplement Liqd Take 1 Container by mouth 3 (three) times daily between meals.   HYDROcodone-acetaminophen 5-325 MG tablet Commonly known as:  NORCO/VICODIN Take 1 tablet by mouth. Take one tablet three times a day for pain   lubiprostone 24 MCG capsule Commonly known as:  AMITIZA Take 24 mcg by mouth daily.   metoprolol 50 MG tablet Commonly known as:  LOPRESSOR Take 1 tablet (50 mg total) by mouth 2 (two) times daily.   NAMZARIC 28-10 MG Cp24 Generic drug:  Memantine HCl-Donepezil HCl Take 1 capsule by mouth at bedtime.   polyethylene glycol packet Commonly known as:  MIRALAX / GLYCOLAX Take 17 g by mouth daily as needed for mild constipation or moderate constipation.   simethicone 40 MG/0.6ML drops Commonly known as:  MYLICON Take 0.6 mLs (40 mg total) by mouth 4 (four) times daily as needed for flatulence.       Review of Systems  Constitutional: Negative for activity change, appetite change, chills, diaphoresis, fatigue, fever and unexpected weight change.       Left breast mass since at least 2009.  HENT: Positive for hearing loss. Negative for congestion, ear discharge, ear pain, postnasal drip, rhinorrhea, sore throat, tinnitus, trouble swallowing and voice change.   Eyes: Negative for photophobia, pain, discharge, redness, itching and visual disturbance.  Respiratory: Negative for cough, choking, shortness of breath and wheezing.   Cardiovascular: Negative for chest pain, palpitations and leg swelling.  Gastrointestinal: Positive for abdominal distention, abdominal pain and constipation. Negative for blood in stool, diarrhea, nausea and  vomiting.  Endocrine: Negative for cold intolerance, heat intolerance, polydipsia, polyphagia and polyuria.  Genitourinary: Positive for frequency. Negative for difficulty urinating, dysuria, flank pain, hematuria, pelvic pain, urgency and vaginal discharge.  Musculoskeletal: Positive for back pain. Negative for arthralgias, gait problem, myalgias, neck pain and neck stiffness.  Skin: Negative for color change, pallor and rash.       Left breast mass  Allergic/Immunologic: Negative.   Neurological: Negative for dizziness, tremors, seizures, syncope, weakness, numbness and headaches.       Dementia  Hematological: Negative for adenopathy. Does not bruise/bleed easily.  Psychiatric/Behavioral: Positive for confusion. Negative for agitation, behavioral problems, dysphoric mood, hallucinations, sleep disturbance and suicidal ideas. The patient is not nervous/anxious and is not hyperactive.        09/25/15 MMSE 21/30    Immunization History  Administered Date(s) Administered  . Influenza Whole 09/02/2005, 09/05/2007, 09/28/2012  . PPD Test 10/15/2013  . Pneumococcal Polysaccharide-23 11/24/2003  . Td  09/29/2002   Pertinent  Health Maintenance Due  Topic Date Due  . PNA vac Low Risk Adult (2 of 2 - PCV13) 11/23/2004  . INFLUENZA VACCINE  09/22/2017 (Originally 06/23/2017)  . DEXA SCAN  Completed   Fall Risk  08/19/2015 04/01/2015 08/31/2013  Falls in the past year? No No Yes  Number falls in past yr: - - 2 or more  Risk for fall due to : - Impaired mobility;Impaired balance/gait Impaired balance/gait   Functional Status Survey:    Vitals:   03/01/17 1031  BP: 140/80  Pulse: 62  Resp: 16  Temp: (!) 96.7 F (35.9 C)  Weight: 101 lb 3.2 oz (45.9 kg)  Height:  (1.651 m)   Body mass index is 16.84 kg/m. Physical Exam  Constitutional: She appears well-developed and well-nourished. No distress.  HENT:  Head: Normocephalic and atraumatic.  Nose: Nose normal.  Mouth/Throat:  Oropharynx is clear and moist. No oropharyngeal exudate.  Eyes: Conjunctivae and EOM are normal. Pupils are equal, round, and reactive to light. Right eye exhibits no discharge. Left eye exhibits no discharge. No scleral icterus.  Neck: Normal range of motion. Neck supple. No JVD present. No thyromegaly present.  Cardiovascular: Normal rate and regular rhythm.   Murmur heard. Systolic murmur appreciated at the left sternal border 2/6  Pulmonary/Chest: Effort normal and breath sounds normal. No respiratory distress. She has no wheezes. She has no rales.  Left breast mass.  Abdominal: Soft. She exhibits no distension and no mass. There is no tenderness. There is no rebound and no guarding.  BS x4 quadrants  Musculoskeletal: Normal range of motion. She exhibits tenderness. She exhibits no edema.  Pain in the right shoulder/shoulder blade, neck, middle/lower back-gait instability and frequent falling. C/o aches allover-better since Norco tid. Scoliokyphosis.   Lymphadenopathy:    She has no cervical adenopathy.  Neurological: She is alert. She has normal reflexes. No cranial nerve deficit. She exhibits normal muscle tone. Coordination normal.  Demented  Skin: Skin is warm and dry. No rash noted. She is not diaphoretic. No erythema.  Mobile, smooth, a large marble size, non-tender at left breast 7 o'clock  Psychiatric: Her mood appears not anxious. Her affect is inappropriate. Her affect is not angry, not blunt and not labile. Her speech is delayed. Her speech is not rapid and/or pressured, not tangential and not slurred. She is slowed. She is not agitated, not aggressive, not hyperactive, not withdrawn and not actively hallucinating. Thought content is not paranoid and not delusional. Cognition and memory are impaired. She expresses impulsivity and inappropriate judgment. She does not exhibit a depressed mood. She is communicative. She exhibits abnormal recent memory. She exhibits normal remote memory.  She is attentive.    Labs reviewed:  Recent Labs  06/23/16 06/28/16 1549 01/12/17  NA 139 138 137  K 3.8 4.2 4.1  CL  --  99*  --   CO2  --  32  --   GLUCOSE  --  96  --   BUN 22* 32* 22*  CREATININE 0.6 0.66 0.7  CALCIUM  --  9.2  --     Recent Labs  06/23/16 06/28/16 1549 01/12/17  AST 14 14* 11*  ALT 9 10* 8  ALKPHOS 50 62 59  BILITOT  --  0.4  --   PROT  --  7.3  --   ALBUMIN  --  3.6  --     Recent Labs  06/23/16 06/28/16 1549 01/12/17  WBC  6.0 6.5 7.0  NEUTROABS  --  4.6  --   HGB 13.4 12.5 12.2  HCT 40 39.5 37  MCV  --  92.7  --   PLT 135* 168 144*   Lab Results  Component Value Date   TSH 1.00 06/23/2016   Lab Results  Component Value Date   HGBA1C 5.9 01/17/2013   Lab Results  Component Value Date   CHOL 181 01/07/2010   HDL 93.60 01/07/2010   LDLCALC 78 01/07/2010   TRIG 49.0 01/07/2010   CHOLHDL 2 01/07/2010    Significant Diagnostic Results in last 30 days:  No results found.  Assessment/Plan No problem-specific Assessment & Plan notes found for this encounter.     Family/ staff Communication: SNF  Labs/tests ordered:  none

## 2017-03-01 NOTE — Assessment & Plan Note (Signed)
Controlled, continue Amlodipine 5mg and Metoprolol 50mg bid. 

## 2017-03-01 NOTE — Assessment & Plan Note (Signed)
Mood is stable since off Mirtazapine and started Cymbalta  in setting of aches and pains 03/2015

## 2017-03-01 NOTE — Assessment & Plan Note (Signed)
Adult brief to manage urinary incontinence 

## 2017-03-01 NOTE — Assessment & Plan Note (Signed)
SNF for care. Continue Aricept and Namenda. MMSE 22/30 10/26/14, 21/30 09/25/15 

## 2017-03-01 NOTE — Assessment & Plan Note (Signed)
Stable, off PPI  

## 2017-03-01 NOTE — Assessment & Plan Note (Signed)
Stable. Continue MiraLax prn and Amitiza daily

## 2017-03-22 DIAGNOSIS — M25559 Pain in unspecified hip: Secondary | ICD-10-CM | POA: Diagnosis not present

## 2017-04-01 ENCOUNTER — Encounter: Payer: Self-pay | Admitting: Nurse Practitioner

## 2017-04-01 ENCOUNTER — Non-Acute Institutional Stay (SKILLED_NURSING_FACILITY): Payer: Medicare Other | Admitting: Nurse Practitioner

## 2017-04-01 DIAGNOSIS — R269 Unspecified abnormalities of gait and mobility: Secondary | ICD-10-CM

## 2017-04-01 DIAGNOSIS — K5900 Constipation, unspecified: Secondary | ICD-10-CM | POA: Diagnosis not present

## 2017-04-01 DIAGNOSIS — R627 Adult failure to thrive: Secondary | ICD-10-CM

## 2017-04-01 DIAGNOSIS — K219 Gastro-esophageal reflux disease without esophagitis: Secondary | ICD-10-CM | POA: Diagnosis not present

## 2017-04-01 DIAGNOSIS — F0150 Vascular dementia without behavioral disturbance: Secondary | ICD-10-CM | POA: Diagnosis not present

## 2017-04-01 DIAGNOSIS — N318 Other neuromuscular dysfunction of bladder: Secondary | ICD-10-CM | POA: Diagnosis not present

## 2017-04-01 DIAGNOSIS — I10 Essential (primary) hypertension: Secondary | ICD-10-CM

## 2017-04-01 DIAGNOSIS — M159 Polyosteoarthritis, unspecified: Secondary | ICD-10-CM

## 2017-04-01 DIAGNOSIS — F418 Other specified anxiety disorders: Secondary | ICD-10-CM | POA: Diagnosis not present

## 2017-04-01 NOTE — Assessment & Plan Note (Signed)
Pain is better controlled, chronic mid back pain in thoracic spine area,  continue Norco 5/32313m tid

## 2017-04-01 NOTE — Assessment & Plan Note (Signed)
Stable, off acid reducer 

## 2017-04-01 NOTE — Assessment & Plan Note (Signed)
Ambulate with walker for a short distance, w/c to go further

## 2017-04-01 NOTE — Assessment & Plan Note (Signed)
Continue supportive measures 

## 2017-04-01 NOTE — Assessment & Plan Note (Signed)
Stable. Continue MiraLax prn and Amitiza daily

## 2017-04-01 NOTE — Progress Notes (Signed)
[facility]  [facility] Room Number: 22 Place of Service:  SNF (31) Provider:  Clovia Reine, Manxie  NP  Kimber RelicGreen, Arthur G, MD  Patient Care Team: Kimber RelicGreen, Arthur G, MD as PCP - General (Internal Medicine) Laurey MoraleMcLean, Dalton S, MD as Consulting Physician (Cardiology) Aairah Negrette X, NP as Nurse Practitioner (Internal Medicine)  Extended Emergency Contact Information Primary Emergency Contact: Christo,Charles Address: [address]          [place], [place] [address] [place] Home Phone: [phone] Relation: Son Secondary Emergency Contact: Stuart,Cleleste Address: [address]          [place], [place] [address] [place] Home Phone: [phone] Mobile Phone: [phone] Relation: Daughter  Code Status:  DNR Goals of care: Advanced Directive information Advanced Directives 04/01/2017  Does Patient Have a Medical Advance Directive? Yes  Type of Estate agentAdvance Directive Healthcare Power of [place];Out of facility DNR (pink MOST or yellow form)  Does patient want to make changes to medical advance directive? No - Patient declined  Copy of Healthcare Power of Attorney in Chart? Yes  Pre-existing out of facility DNR order (yellow form or pink MOST form) Yellow form placed in chart (order not valid for inpatient use)     Chief Complaint  Patient presents with  . Medical Management of Chronic Issues    HPI:  Pt is a 83 y.o. female seen today for medical management of chronic diseases.      Hx of back pain, chronic, on Norco tidand Tylenol q6h prn, HTN controlled on Amlodipine 5mg  and Metoprolol 50mg  bid, GERD asymptomatic, off acid reducer, depression managed with Cymbalta 30mg , weight loss is gradual, constipation controlled on MiraLax prn and Amitizadaily, dementia, last MMSE 21/30 09/25/15, taking Aricept and Namenda to preserve memory, she is functioning well in SNF    Past Medical History:  Diagnosis Date  . Abnormality of gait 09/29/2012    . Allergic rhinitis   . Anemia, unspecified 01/12/2013  . Aortic insufficiency 04/16/2010   a. Echo 2011 - mild to moderate AI, EF 60%.  . Breast lump on left side at 7 o'clock position 07/02/2014   07/05/14 POA declined Mammogram. Desires no IVF. ABT for comfort measures only.    . Congestive heart failure, unspecified 06/30/2003  . Dementia   . Descending thoracic aortic dissection (HCC) 04/24/2010   a. MRA 2011 - begins distal to left subclavian and extends throughout the full length of the Ao.  Marland Kitchen. External hemorrhoids without mention of complication 09/29/2012  . Failure to thrive in adult 04/01/2015  . GERD (gastroesophageal reflux disease)   . Hypertension   . Hyposmolality and/or hyponatremia 09/29/2012  . IBS (irritable bowel syndrome)   . Impacted cerumen 09/29/2012  . Lumbago 10/06/2012  . Osteoarthritis    hands  . Osteoporosis   . Other abnormal blood chemistry 01/12/2013  . Otitis externa 09/29/2012  . Palpitation    a. PAC's & PVC's by Holter - 2011  . Personal history of fall 10/06/2012  . Tortuous colon 2002  . Unspecified conductive hearing loss 09/29/2012  . Unspecified constipation 09/29/2012  . Unspecified venous (peripheral) insufficiency 09/29/2012   Past Surgical History:  Procedure Laterality Date  . CATARACT EXTRACTION W/ INTRAOCULAR LENS IMPLANT    . DILATION AND CURETTAGE OF UTERUS  1960    Allergies  Allergen Reactions  . Alendronate Sodium Other (See Comments)    Reaction:  GI upset   . Influenza Vaccines Other (See  Comments)    Reaction:  Unknown   . Merthiolate [Thimerosal]     Outpatient Encounter Prescriptions as of 04/01/2017  Medication Sig  . acetaminophen (TYLENOL) 325 MG tablet Take 650 mg by mouth every 6 (six) hours as needed for mild pain, moderate pain or headache.   Marland Kitchen amLODipine (NORVASC) 5 MG tablet Take 5 mg by mouth daily.  . DULoxetine (CYMBALTA) 30 MG capsule Take 30 mg by mouth daily.  . feeding supplement (BOOST / RESOURCE BREEZE)  LIQD Take 1 Container by mouth 3 (three) times daily between meals.   Marland Kitchen HYDROcodone-acetaminophen (NORCO/VICODIN) 5-325 MG tablet Take 1 tablet by mouth. Take one tablet three times a day for pain  . Hypromellose (ARTIFICIAL TEARS OP) Apply to eye. One drop in both eyes twice daily  . lubiprostone (AMITIZA) 24 MCG capsule Take 24 mcg by mouth daily.  . Memantine HCl-Donepezil HCl (NAMZARIC) 28-10 MG CP24 Take 1 capsule by mouth at bedtime.  . metoprolol (LOPRESSOR) 50 MG tablet Take 1 tablet (50 mg total) by mouth 2 (two) times daily.  . polyethylene glycol (MIRALAX / GLYCOLAX) packet Take 17 g by mouth daily as needed for mild constipation or moderate constipation.   . simethicone (MYLICON) 40 MG/0.6ML drops Take 0.6 mLs (40 mg total) by mouth 4 (four) times daily as needed for flatulence.   No facility-administered encounter medications on file as of 04/01/2017.     Review of Systems  Constitutional: Negative for activity change, appetite change, chills, diaphoresis, fatigue, fever and unexpected weight change.       Left breast mass since at least 2009.  HENT: Positive for hearing loss. Negative for congestion, ear discharge, ear pain, postnasal drip, rhinorrhea, sore throat, tinnitus, trouble swallowing and voice change.   Eyes: Negative for photophobia, pain, discharge, redness, itching and visual disturbance.  Respiratory: Negative for cough, choking, shortness of breath and wheezing.   Cardiovascular: Negative for chest pain, palpitations and leg swelling.  Gastrointestinal: Positive for constipation. Negative for abdominal distention, abdominal pain, blood in stool, diarrhea, nausea and vomiting.  Endocrine: Negative for cold intolerance, heat intolerance, polydipsia, polyphagia and polyuria.  Genitourinary: Positive for frequency. Negative for difficulty urinating, dysuria, flank pain, hematuria, pelvic pain, urgency and vaginal discharge.  Musculoskeletal: Positive for back pain.  Negative for arthralgias, gait problem, myalgias, neck pain and neck stiffness.  Skin: Negative for color change, pallor and rash.       Left breast mass  Allergic/Immunologic: Negative.   Neurological: Negative for dizziness, tremors, seizures, syncope, weakness, numbness and headaches.       Dementia  Hematological: Negative for adenopathy. Does not bruise/bleed easily.  Psychiatric/Behavioral: Positive for confusion. Negative for agitation, behavioral problems, dysphoric mood, hallucinations, sleep disturbance and suicidal ideas. The patient is not nervous/anxious and is not hyperactive.        09/25/15 MMSE 21/30    Immunization History  Administered Date(s) Administered  . Influenza Whole 09/02/2005, 09/05/2007, 09/28/2012  . PPD Test 10/15/2013  . Pneumococcal Polysaccharide-23 11/24/2003  . Td 09/29/2002   Pertinent  Health Maintenance Due  Topic Date Due  . PNA vac Low Risk Adult (2 of 2 - PCV13) 11/23/2004  . INFLUENZA VACCINE  09/22/2017 (Originally 06/23/2017)  . DEXA SCAN  Completed   Fall Risk  08/19/2015 04/01/2015 08/31/2013  Falls in the past year? No No Yes  Number falls in past yr: - - 2 or more  Risk for fall due to : - Impaired mobility;Impaired balance/gait Impaired balance/gait  Functional Status Survey:    Vitals:   04/01/17 1400  BP: 122/68  Pulse: 68  Resp: 18  Temp: 97.4 F (36.3 C)  Weight: 100 lb (45.4 kg)  Height: 5\' 5"  (1.651 m)   Body mass index is 16.64 kg/m. Physical Exam  Constitutional: She appears well-developed and well-nourished. No distress.  HENT:  Head: Normocephalic and atraumatic.  Nose: Nose normal.  Mouth/Throat: Oropharynx is clear and moist. No oropharyngeal exudate.  Eyes: Conjunctivae and EOM are normal. Pupils are equal, round, and reactive to light. Right eye exhibits no discharge. Left eye exhibits no discharge. No scleral icterus.  Neck: Normal range of motion. Neck supple. No JVD present. No thyromegaly present.    Cardiovascular: Normal rate and regular rhythm.   Murmur heard. Systolic murmur appreciated at the left sternal border 2/6  Pulmonary/Chest: Effort normal and breath sounds normal. No respiratory distress. She has no wheezes. She has no rales.  Left breast mass.  Abdominal: Soft. She exhibits no distension and no mass. There is no tenderness. There is no rebound and no guarding.  BS x4 quadrants  Musculoskeletal: Normal range of motion. She exhibits no edema or tenderness.  Pain in the right shoulder/shoulder blade, neck, middle/lower back-gait instability and frequent falling. C/o aches allover-better since Norco tid. Scoliokyphosis.   Lymphadenopathy:    She has no cervical adenopathy.  Neurological: She is alert. She has normal reflexes. No cranial nerve deficit. She exhibits normal muscle tone. Coordination normal.  Demented  Skin: Skin is warm and dry. No rash noted. She is not diaphoretic. No erythema.  Mobile, smooth, a large marble size, non-tender at left breast 7 o'clock  Psychiatric: Her mood appears not anxious. Her affect is inappropriate. Her affect is not angry, not blunt and not labile. Her speech is delayed. Her speech is not rapid and/or pressured, not tangential and not slurred. She is slowed. She is not agitated, not aggressive, not hyperactive, not withdrawn and not actively hallucinating. Thought content is not paranoid and not delusional. Cognition and memory are impaired. She expresses impulsivity and inappropriate judgment. She does not exhibit a depressed mood. She is communicative. She exhibits abnormal recent memory. She exhibits normal remote memory. She is attentive.    Labs reviewed:  Recent Labs  06/28/16 1549 01/12/17 04/06/17  NA 138 137 137  K 4.2 4.1 4.1  CL 99*  --   --   CO2 32  --   --   GLUCOSE 96  --   --   BUN 32* 22* 23*  CREATININE 0.66 0.7 0.5  CALCIUM 9.2  --   --     Recent Labs  06/28/16 1549 01/12/17 04/06/17  AST 14* 11* 11*   ALT 10* 8 6*  ALKPHOS 62 59 95  BILITOT 0.4  --   --   PROT 7.3  --   --   ALBUMIN 3.6  --   --     Recent Labs  06/28/16 1549 01/12/17 04/06/17  WBC 6.5 7.0 5.6  NEUTROABS 4.6  --   --   HGB 12.5 12.2 10.5*  HCT 39.5 37 32*  MCV 92.7  --   --   PLT 168 144* 181   Lab Results  Component Value Date   TSH 1.00 06/23/2016   Lab Results  Component Value Date   HGBA1C 5.9 01/17/2013   Lab Results  Component Value Date   CHOL 181 01/07/2010   HDL 93.60 01/07/2010   LDLCALC 78 01/07/2010  TRIG 49.0 01/07/2010   CHOLHDL 2 01/07/2010    Significant Diagnostic Results in last 30 days:  No results found.  Assessment/Plan Hypertension Controlled, continue Amlodipine 5mg  and Metoprolol 50mg  bid  Constipation Stable. Continue MiraLax prn and Amitiza daily  GERD (gastroesophageal reflux disease) Stable, off acid reducer.   Dementia, vascular SNF for care. Continue Aricept and Namenda. MMSE 22/30 10/26/14, 21/30 09/25/15. The patient seems more confused, denied dysuria, cough, chest pain, she is afebrile, no O2 desaturation. Obtain UA C/S CBC CMP  OVERACTIVE BLADDER Adult brief to manage urinary incontinence   Adult failure to thrive Continue supportive measures.   Depression with anxiety Mood is stable since off Mirtazapine and started Cymbalta 30mg  in setting of aches and pains 03/2015  Gait disorder Ambulate with walker for a short distance, w/c to go further  OSTEOARTHROSIS, GENERALIZED, MULTIPLE SITES Pain is better controlled, chronic mid back pain in thoracic spine area,  continue Norco 5/350m tid     Family/ staff Communication: SNF  Labs/tests ordered:  UA C/S CBC CMP

## 2017-04-01 NOTE — Assessment & Plan Note (Addendum)
SNF for care. Continue Aricept and Namenda. MMSE 22/30 10/26/14, 21/30 09/25/15. The patient seems more confused, denied dysuria, cough, chest pain, she is afebrile, no O2 desaturation. Obtain UA C/S CBC CMP

## 2017-04-01 NOTE — Assessment & Plan Note (Signed)
Mood is stable since off Mirtazapine and started Cymbalta 30mg  in setting of aches and pains 03/2015

## 2017-04-01 NOTE — Assessment & Plan Note (Signed)
Adult brief to manage urinary incontinence 

## 2017-04-01 NOTE — Assessment & Plan Note (Signed)
Controlled, continue Amlodipine 5mg and Metoprolol 50mg bid. 

## 2017-04-02 DIAGNOSIS — N390 Urinary tract infection, site not specified: Secondary | ICD-10-CM | POA: Diagnosis not present

## 2017-04-06 DIAGNOSIS — E119 Type 2 diabetes mellitus without complications: Secondary | ICD-10-CM | POA: Diagnosis not present

## 2017-04-06 DIAGNOSIS — E782 Mixed hyperlipidemia: Secondary | ICD-10-CM | POA: Diagnosis not present

## 2017-04-06 DIAGNOSIS — G309 Alzheimer's disease, unspecified: Secondary | ICD-10-CM | POA: Diagnosis not present

## 2017-04-06 DIAGNOSIS — I10 Essential (primary) hypertension: Secondary | ICD-10-CM | POA: Diagnosis not present

## 2017-04-06 LAB — HEPATIC FUNCTION PANEL
ALT: 6 U/L — AB (ref 7–35)
AST: 11 U/L — AB (ref 13–35)
Alkaline Phosphatase: 95 U/L (ref 25–125)
Bilirubin, Total: 0.4 mg/dL

## 2017-04-06 LAB — BASIC METABOLIC PANEL WITH GFR
BUN: 23 mg/dL — AB (ref 4–21)
Creatinine: 0.5 mg/dL (ref <=1.1)
Glucose: 83 mg/dL
Potassium: 4.1 mmol/L (ref 3.4–5.3)
Sodium: 137 mmol/L (ref 137–147)

## 2017-04-06 LAB — CBC AND DIFFERENTIAL
HCT: 32 % — AB (ref 36–46)
HEMOGLOBIN: 10.5 g/dL — AB (ref 12.0–16.0)
Platelets: 181 10*3/uL (ref 150–399)
WBC: 5.6 10*3/mL

## 2017-04-07 ENCOUNTER — Other Ambulatory Visit: Payer: Self-pay | Admitting: *Deleted

## 2017-05-25 ENCOUNTER — Encounter: Payer: Self-pay | Admitting: Internal Medicine

## 2017-05-25 ENCOUNTER — Non-Acute Institutional Stay (SKILLED_NURSING_FACILITY): Payer: Medicare Other | Admitting: Internal Medicine

## 2017-05-25 DIAGNOSIS — E43 Unspecified severe protein-calorie malnutrition: Secondary | ICD-10-CM | POA: Diagnosis not present

## 2017-05-25 DIAGNOSIS — G8929 Other chronic pain: Secondary | ICD-10-CM

## 2017-05-25 DIAGNOSIS — D649 Anemia, unspecified: Secondary | ICD-10-CM | POA: Diagnosis not present

## 2017-05-25 DIAGNOSIS — M549 Dorsalgia, unspecified: Secondary | ICD-10-CM | POA: Diagnosis not present

## 2017-05-25 DIAGNOSIS — I10 Essential (primary) hypertension: Secondary | ICD-10-CM | POA: Diagnosis not present

## 2017-05-25 DIAGNOSIS — K5909 Other constipation: Secondary | ICD-10-CM

## 2017-05-25 NOTE — Progress Notes (Signed)
[facility]  [facility] Room Number: 22 Place of Service:  SNF [phone](31) Provider:  Oneal GroutMahima Canary Fister MD  Oneal GroutPandey, Roshni Burbano, MD  Patient Care Team: Oneal GroutPandey, Peder Allums, MD as PCP - General (Internal Medicine) Laurey MoraleMcLean, Dalton S, MD as Consulting Physician (Cardiology) Mast, Man X, NP as Nurse Practitioner (Internal Medicine)  Extended Emergency Contact Information Primary Emergency Contact: Gramlich,Charles Address: [address]          [place], [place] [address] [place] Home Phone: [phone] Relation: Son Secondary Emergency Contact: Stuart,Cleleste Address: [address]          [place], [place] [address] [place] Home Phone: [phone] Mobile Phone: [phone] Relation: Daughter  Code Status:  DNR Goals of care: Advanced Directive information Advanced Directives 04/01/2017  Does Patient Have a Medical Advance Directive? Yes  Type of Estate agentAdvance Directive Healthcare Power of [place];Out of facility DNR (pink MOST or yellow form)  Does patient want to make changes to medical advance directive? No - Patient declined  Copy of Healthcare Power of Attorney in Chart? Yes  Pre-existing out of facility DNR order (yellow form or pink MOST form) Yellow form placed in chart (order not valid for inpatient use)     Chief Complaint  Patient presents with  . Medical Management of Chronic Issues    Routine Visit     HPI:  Pt is a 83 y.o. female seen today for medical management of chronic diseases. She is seen in her room today. She does not participate in HPI and ROS. Per nursing, there has been ongoing cognitive decline and she has poor oral intake. She takes less than 50% of her meals. She feeds herself and requires assistance at times. No fall has been reported. No pressure ulcer or wound reported. She needs assistance with transfers and has been out of bed daily. She is complaint with her medications. She has chronic pain and is on pain  medication.     Past Medical History:  Diagnosis Date  . Abnormality of gait 09/29/2012  . Allergic rhinitis   . Anemia, unspecified 01/12/2013  . Aortic insufficiency 04/16/2010   a. Echo 2011 - mild to moderate AI, EF 60%.  . Breast lump on left side at 7 o'clock position 07/02/2014   07/05/14 POA declined Mammogram. Desires no IVF. ABT for comfort measures only.    . Congestive heart failure, unspecified 06/30/2003  . Dementia   . Descending thoracic aortic dissection (HCC) 04/24/2010   a. MRA 2011 - begins distal to left subclavian and extends throughout the full length of the Ao.  Marland Kitchen. External hemorrhoids without mention of complication 09/29/2012  . Failure to thrive in adult 04/01/2015  . GERD (gastroesophageal reflux disease)   . Hypertension   . Hyposmolality and/or hyponatremia 09/29/2012  . IBS (irritable bowel syndrome)   . Impacted cerumen 09/29/2012  . Lumbago 10/06/2012  . Osteoarthritis    hands  . Osteoporosis   . Other abnormal blood chemistry 01/12/2013  . Otitis externa 09/29/2012  . Palpitation    a. PAC's & PVC's by Holter - 2011  . Personal history of fall 10/06/2012  . Tortuous colon 2002  . Unspecified conductive hearing loss 09/29/2012  . Unspecified constipation 09/29/2012  . Unspecified venous (peripheral) insufficiency 09/29/2012   Past Surgical History:  Procedure Laterality Date  . CATARACT EXTRACTION W/ INTRAOCULAR LENS IMPLANT    . DILATION AND CURETTAGE OF UTERUS  1960    Allergies  Allergen  Reactions  . Alendronate Sodium Other (See Comments)    Reaction:  GI upset   . Influenza Vaccines Other (See Comments)    Reaction:  Unknown   . Merthiolate [Thimerosal]     Outpatient Encounter Prescriptions as of 05/25/2017  Medication Sig  . acetaminophen (TYLENOL) 325 MG tablet Take 650 mg by mouth every 6 (six) hours as needed for mild pain, moderate pain or headache.   Marland Kitchen amLODipine (NORVASC) 5 MG tablet Take 5 mg by mouth daily.  . DULoxetine (CYMBALTA)  30 MG capsule Take 30 mg by mouth daily.  . feeding supplement (BOOST / RESOURCE BREEZE) LIQD Take 1 Container by mouth 3 (three) times daily between meals.   Marland Kitchen HYDROcodone-acetaminophen (NORCO/VICODIN) 5-325 MG tablet Take 1 tablet by mouth 3 (three) times daily.   . Hypromellose (ARTIFICIAL TEARS OP) Apply 1 drop to eye 2 (two) times daily. Both eyes  . lubiprostone (AMITIZA) 24 MCG capsule Take 24 mcg by mouth daily.  . Memantine HCl-Donepezil HCl (NAMZARIC) 28-10 MG CP24 Take 1 capsule by mouth at bedtime.  . metoprolol (LOPRESSOR) 50 MG tablet Take 1 tablet (50 mg total) by mouth 2 (two) times daily.  . polyethylene glycol (MIRALAX / GLYCOLAX) packet Take 17 g by mouth daily as needed for mild constipation or moderate constipation.   . simethicone (MYLICON) 40 MG/0.6ML drops Take 0.6 mLs (40 mg total) by mouth 4 (four) times daily as needed for flatulence.   No facility-administered encounter medications on file as of 05/25/2017.     Review of Systems  Unable to perform ROS: Dementia    Immunization History  Administered Date(s) Administered  . Influenza Whole 09/02/2005, 09/05/2007, 09/28/2012  . PPD Test 10/15/2013  . Pneumococcal Polysaccharide-23 11/24/2003  . Td 09/29/2002   Pertinent  Health Maintenance Due  Topic Date Due  . PNA vac Low Risk Adult (2 of 2 - PCV13) 11/23/2004  . INFLUENZA VACCINE  09/22/2017 (Originally 06/23/2017)  . DEXA SCAN  Completed   Fall Risk  08/19/2015 04/01/2015 08/31/2013  Falls in the past year? No No Yes  Number falls in past yr: - - 2 or more  Risk for fall due to : - Impaired mobility;Impaired balance/gait Impaired balance/gait   Functional Status Survey:    Vitals:   05/25/17 1515  BP: 118/62  Pulse: 70  Resp: 20  Temp: 98.8 F (37.1 C)  TempSrc: Oral  Weight: 98 lb 12.8 oz (44.8 kg)  Height: 5\' 5"  (1.651 m)   Body mass index is 16.44 kg/m. Physical Exam  Constitutional: No distress.  Elderly female, thin built, frail  HENT:    Head: Normocephalic and atraumatic.  Mouth/Throat: Oropharynx is clear and moist.  Eyes: Conjunctivae are normal. Pupils are equal, round, and reactive to light. Right eye exhibits no discharge. Left eye exhibits no discharge.  Neck: Normal range of motion. Neck supple.  Cardiovascular: Normal rate and regular rhythm.   Murmur heard. Pulmonary/Chest: Effort normal and breath sounds normal. She has no wheezes. She has no rales.  Has left breast mass, non tender  Abdominal: Soft. Bowel sounds are normal. There is no tenderness. There is no guarding.  Musculoskeletal:  Can move all 4 extremities, has to be wheeled around on wheelchair  Lymphadenopathy:    She has no cervical adenopathy.  Neurological:  Alert, oriented only to self  Skin: Skin is warm and dry. She is not diaphoretic.  Psychiatric:  Pleasantly confused    Labs reviewed:  Recent Labs  06/28/16 1549 01/12/17 04/06/17  NA 138 137 137  K 4.2 4.1 4.1  CL 99*  --   --   CO2 32  --   --   GLUCOSE 96  --   --   BUN 32* 22* 23*  CREATININE 0.66 0.7 0.5  CALCIUM 9.2  --   --     Recent Labs  06/28/16 1549 01/12/17 04/06/17  AST 14* 11* 11*  ALT 10* 8 6*  ALKPHOS 62 59 95  BILITOT 0.4  --   --   PROT 7.3  --   --   ALBUMIN 3.6  --   --     Recent Labs  06/28/16 1549 01/12/17 04/06/17  WBC 6.5 7.0 5.6  NEUTROABS 4.6  --   --   HGB 12.5 12.2 10.5*  HCT 39.5 37 32*  MCV 92.7  --   --   PLT 168 144* 181   Lab Results  Component Value Date   TSH 1.00 06/23/2016   Lab Results  Component Value Date   HGBA1C 5.9 01/17/2013   Lab Results  Component Value Date   CHOL 181 01/07/2010   HDL 93.60 01/07/2010   LDLCALC 78 01/07/2010   TRIG 49.0 01/07/2010   CHOLHDL 2 01/07/2010    Significant Diagnostic Results in last 30 days:  No results found.  Assessment/Plan  Hypertension Reviewed bp reading, reading mostly below 120/70 with some readings of 106/64, 110/56, 120/56. Currently on amlodipine 5 mg  daily, metoprolol 50 mg bid. Discontinue amlodipine for now. Check bp bid x 1 week, then daily x 2 weeks to assess further.   Anemia unspecified Low Hb on last lab. Likely has anemia of chronic disease. No bleed reported. Normal MCV. Check ferritin level.   Chronic back pain On tylenol 650 mg q6h prn, cymbalta 30 mg daily, norco 5-325 mg tid. Monitor clinically. Fall medications  Chronic constipation On amitiza and miralax, monitor with her on norco and limited mobility.   Severe protein calorie malnutrition With dementia decline anticipated, assist with feeding if needed. Continue protein supplement    Family/ staff Communication: reviewed care plan with her charge nurse.    Labs/tests ordered:  ferritin   Oneal Grout, MD Internal Medicine [facility] [address] [place], [place] [address] Cell Phone (Monday-Friday 8 am - 5 pm): [phone] On Call: [phone] and follow prompts after 5 pm and on weekends Office Phone: [phone] Office Fax: [phone]

## 2017-05-27 DIAGNOSIS — R413 Other amnesia: Secondary | ICD-10-CM | POA: Diagnosis not present

## 2017-05-27 DIAGNOSIS — D510 Vitamin B12 deficiency anemia due to intrinsic factor deficiency: Secondary | ICD-10-CM | POA: Diagnosis not present

## 2017-06-18 ENCOUNTER — Non-Acute Institutional Stay (SKILLED_NURSING_FACILITY): Payer: Medicare Other | Admitting: Nurse Practitioner

## 2017-06-18 ENCOUNTER — Encounter: Payer: Self-pay | Admitting: Nurse Practitioner

## 2017-06-18 DIAGNOSIS — N6324 Unspecified lump in the left breast, lower inner quadrant: Secondary | ICD-10-CM

## 2017-06-18 DIAGNOSIS — F0150 Vascular dementia without behavioral disturbance: Secondary | ICD-10-CM

## 2017-06-18 DIAGNOSIS — M159 Polyosteoarthritis, unspecified: Secondary | ICD-10-CM | POA: Diagnosis not present

## 2017-06-18 DIAGNOSIS — I499 Cardiac arrhythmia, unspecified: Secondary | ICD-10-CM | POA: Diagnosis not present

## 2017-06-18 DIAGNOSIS — W19XXXA Unspecified fall, initial encounter: Secondary | ICD-10-CM | POA: Diagnosis not present

## 2017-06-18 NOTE — Progress Notes (Signed)
[facility]:  [facility] Room Number: 22 Place of Service:  SNF (31) Provider:  Danyel Tobey, Manxie  NP  Oneal GroutPandey, Mahima, MD  Patient Care Team: Oneal GroutPandey, Mahima, MD as PCP - General (Internal Medicine) Laurey MoraleMcLean, Dalton S, MD as Consulting Physician (Cardiology) Jazalynn Mireles X, NP as Nurse Practitioner (Internal Medicine)  Extended Emergency Contact Information Primary Emergency Contact: Oman,Charles Address: [address]          [place], [place] [address] [place] Home Phone: [phone] Relation: Son Secondary Emergency Contact: Stuart,Cleleste Address: [address]          [place], [place] [address] [place] Home Phone: [phone] Mobile Phone: [phone] Relation: Daughter  Code Status:  DNR Goals of care: Advanced Directive information Advanced Directives 06/18/2017  Does Patient Have a Medical Advance Directive? Yes  Type of Advance Directive Out of facility DNR (pink MOST or yellow form);Healthcare Power of Attorney  Does patient want to make changes to medical advance directive? No - Patient declined  Copy of Healthcare Power of Attorney in Chart? Yes  Pre-existing out of facility DNR order (yellow form or pink MOST form) Yellow form placed in chart (order not valid for inpatient use)     Chief Complaint  Patient presents with  . Acute Visit    Fall x 2 with hallucinations.    HPI:  Pt is a 83 y.o. female seen today for an acute visit for fall x 2, found lying on floor, no recollection of the events, no apparent injury, staff reported the patient had been reaching out for something that was not there earlier before fall, seems more confused, wandering, and hallucinating, the patient has hx of dementia, taking Namenda and Aricept, also has chronic back pain, taking Norco tid. She is afebrile, denied suprapubic or CVA pain, no noted nausea, vomiting or diarrhea.    Past Medical History:  Diagnosis Date  .  Abnormality of gait 09/29/2012  . Allergic rhinitis   . Anemia, unspecified 01/12/2013  . Aortic insufficiency 04/16/2010   a. Echo 2011 - mild to moderate AI, EF 60%.  . Breast lump on left side at 7 o'clock position 07/02/2014   07/05/14 POA declined Mammogram. Desires no IVF. ABT for comfort measures only.    . Congestive heart failure, unspecified 06/30/2003  . Dementia   . Descending thoracic aortic dissection (HCC) 04/24/2010   a. MRA 2011 - begins distal to left subclavian and extends throughout the full length of the Ao.  Marland Kitchen. External hemorrhoids without mention of complication 09/29/2012  . Failure to thrive in adult 04/01/2015  . GERD (gastroesophageal reflux disease)   . Hypertension   . Hyposmolality and/or hyponatremia 09/29/2012  . IBS (irritable bowel syndrome)   . Impacted cerumen 09/29/2012  . Lumbago 10/06/2012  . Osteoarthritis    hands  . Osteoporosis   . Other abnormal blood chemistry 01/12/2013  . Otitis externa 09/29/2012  . Palpitation    a. PAC's & PVC's by Holter - 2011  . Personal history of fall 10/06/2012  . Tortuous colon 2002  . Unspecified conductive hearing loss 09/29/2012  . Unspecified constipation 09/29/2012  . Unspecified venous (peripheral) insufficiency 09/29/2012   Past Surgical History:  Procedure Laterality Date  . CATARACT EXTRACTION W/ INTRAOCULAR LENS IMPLANT    . DILATION AND CURETTAGE OF UTERUS  1960    Allergies  Allergen Reactions  . Alendronate Sodium Other (See Comments)    Reaction:  GI upset   .  Influenza Vaccines Other (See Comments)    Reaction:  Unknown   . Merthiolate [Thimerosal]     Outpatient Encounter Prescriptions as of 06/18/2017  Medication Sig  . acetaminophen (TYLENOL) 325 MG tablet Take 650 mg by mouth every 6 (six) hours as needed for mild pain, moderate pain or headache.   . DULoxetine (CYMBALTA) 30 MG capsule Take 30 mg by mouth daily.  . feeding supplement (BOOST / RESOURCE BREEZE) LIQD Take 1 Container by mouth 3  (three) times daily between meals.   Marland Kitchen. HYDROcodone-acetaminophen (NORCO/VICODIN) 5-325 MG tablet Take 1 tablet by mouth 3 (three) times daily.   . Hypromellose (ARTIFICIAL TEARS OP) Apply 1 drop to eye 2 (two) times daily. Both eyes  . lubiprostone (AMITIZA) 24 MCG capsule Take 24 mcg by mouth daily.  . Memantine HCl-Donepezil HCl (NAMZARIC) 28-10 MG CP24 Take 1 capsule by mouth at bedtime.  . metoprolol (LOPRESSOR) 50 MG tablet Take 1 tablet (50 mg total) by mouth 2 (two) times daily.  . polyethylene glycol (MIRALAX / GLYCOLAX) packet Take 17 g by mouth daily as needed for mild constipation or moderate constipation.   . simethicone (MYLICON) 40 MG/0.6ML drops Take 0.6 mLs (40 mg total) by mouth 4 (four) times daily as needed for flatulence.  . [DISCONTINUED] amLODipine (NORVASC) 5 MG tablet Take 5 mg by mouth daily.   No facility-administered encounter medications on file as of 06/18/2017.     Review of Systems  Constitutional: Negative for activity change and appetite change.       Left breast mass since at least 2009.  HENT: Positive for hearing loss. Negative for congestion, trouble swallowing and voice change.   Eyes: Negative for visual disturbance.  Respiratory: Negative for cough and choking.   Cardiovascular: Negative for chest pain, palpitations and leg swelling.  Gastrointestinal: Positive for constipation. Negative for abdominal distention and abdominal pain.  Genitourinary: Positive for frequency. Negative for difficulty urinating and dysuria.  Musculoskeletal: Positive for back pain and gait problem. Negative for arthralgias.       Fall x2  Skin: Negative for pallor and rash.       Left breast mass  Allergic/Immunologic: Negative.   Neurological: Negative for dizziness and headaches.       Dementia  Hematological: Negative for adenopathy.  Psychiatric/Behavioral: Positive for confusion and hallucinations. Negative for agitation and behavioral problems.       09/25/15 MMSE  21/30     Immunization History  Administered Date(s) Administered  . Influenza Whole 09/02/2005, 09/05/2007, 09/28/2012  . PPD Test 10/15/2013  . Pneumococcal Polysaccharide-23 11/24/2003  . Td 09/29/2002   Pertinent  Health Maintenance Due  Topic Date Due  . PNA vac Low Risk Adult (2 of 2 - PCV13) 11/23/2004  . INFLUENZA VACCINE  09/22/2017 (Originally 06/23/2017)  . DEXA SCAN  Completed   Fall Risk  08/19/2015 04/01/2015 08/31/2013  Falls in the past year? No No Yes  Number falls in past yr: - - 2 or more  Risk for fall due to : - Impaired mobility;Impaired balance/gait Impaired balance/gait   Functional Status Survey:    Vitals:   06/18/17 1401  BP: (!) 146/94  Pulse: 78  Resp: 18  Temp: 97.6 F (36.4 C)  SpO2: 92%  Weight: 98 lb 12.8 oz (44.8 kg)  Height: 5\' 5"  (1.651 m)   Body mass index is 16.44 kg/m. Physical Exam  Constitutional: No distress.  HENT:  Head: Normocephalic and atraumatic.  Eyes: Pupils are equal, round, and  reactive to light. Conjunctivae are normal.  Neck: Normal range of motion. Neck supple.  Cardiovascular: Normal rate.   Murmur heard. HR 90s, irregular heart beats  Pulmonary/Chest: Effort normal and breath sounds normal. She has no wheezes. She has no rales.  Has left breast mass, non tender  Abdominal: Soft. Bowel sounds are normal. There is no tenderness. There is no guarding.  Genitourinary:  Genitourinary Comments: Left breast mobile nodule, 3x3x3cm, non tender  Musculoskeletal: Normal range of motion. She exhibits no edema or tenderness.  Neurological: She is alert. No cranial nerve deficit. Coordination normal.  Alert, oriented only to self  Skin: Skin is warm and dry. She is not diaphoretic.  Psychiatric: She has a normal mood and affect. Her behavior is normal.  Confusion. Reaching out for something not present to others.     Labs reviewed:  Recent Labs  06/28/16 1549 01/12/17 04/06/17  NA 138 137 137  K 4.2 4.1 4.1  CL  99*  --   --   CO2 32  --   --   GLUCOSE 96  --   --   BUN 32* 22* 23*  CREATININE 0.66 0.7 0.5  CALCIUM 9.2  --   --     Recent Labs  06/28/16 1549 01/12/17 04/06/17  AST 14* 11* 11*  ALT 10* 8 6*  ALKPHOS 62 59 95  BILITOT 0.4  --   --   PROT 7.3  --   --   ALBUMIN 3.6  --   --     Recent Labs  06/28/16 1549 01/12/17 04/06/17  WBC 6.5 7.0 5.6  NEUTROABS 4.6  --   --   HGB 12.5 12.2 10.5*  HCT 39.5 37 32*  MCV 92.7  --   --   PLT 168 144* 181   Lab Results  Component Value Date   TSH 1.00 06/23/2016   Lab Results  Component Value Date   HGBA1C 5.9 01/17/2013   Lab Results  Component Value Date   CHOL 181 01/07/2010   HDL 93.60 01/07/2010   LDLCALC 78 01/07/2010   TRIG 49.0 01/07/2010   CHOLHDL 2 01/07/2010    Significant Diagnostic Results in last 30 days:  No results found.  Assessment/Plan Dementia, vascular fall x 2, found lying on floor, no recollection of the events, no apparent injury, staff reported the patient had been reaching out for something that was not there earlier before fall, seems more confused, wandering, and hallucinating, the patient has hx of dementia, taking Namenda and Aricept Obtain CBC CMP UA C/S next Monday, may do labs in am if fever or worsening of confusion/hallucination develop  OSTEOARTHROSIS, GENERALIZED, MULTIPLE SITES Pain is better controlled, chronic mid back pain in thoracic spine area,  Trial of dose reduction, Norco 5/345mbid/ tid, daily prn  Arrhythmia HR in 90s, obtain EKG  Breast lump on left side at 7 o'clock position 07/05/14 POA declined Mammogram. Desires no IVF. ABT for comfort measures only.  06/18/17 3x3x3cm, mobile, non tender.      Family/ staff Communication: SNF  Labs/tests ordered:  EKG, CBC, CMP, UA C/S

## 2017-06-18 NOTE — Assessment & Plan Note (Signed)
Pain is better controlled, chronic mid back pain in thoracic spine area,  Trial of dose reduction, Norco 5/33125mbid/ tid, daily prn

## 2017-06-18 NOTE — Assessment & Plan Note (Signed)
X2, no apparent injury, lack of safety awareness related to dementia/increased frailty vs acute episode, obtain UA CBC CMP EKG, observe the patient, supervision for safety.

## 2017-06-18 NOTE — Assessment & Plan Note (Signed)
fall x 2, found lying on floor, no recollection of the events, no apparent injury, staff reported the patient had been reaching out for something that was not there earlier before fall, seems more confused, wandering, and hallucinating, the patient has hx of dementia, taking Namenda and Aricept Obtain CBC CMP UA C/S next Monday, may do labs in am if fever or worsening of confusion/hallucination develop

## 2017-06-18 NOTE — Assessment & Plan Note (Signed)
07/05/14 POA declined Mammogram. Desires no IVF. ABT for comfort measures only.  06/18/17 3x3x3cm, mobile, non tender.

## 2017-06-18 NOTE — Assessment & Plan Note (Signed)
HR in 90s, obtain EKG

## 2017-06-21 DIAGNOSIS — I10 Essential (primary) hypertension: Secondary | ICD-10-CM | POA: Diagnosis not present

## 2017-06-21 DIAGNOSIS — F0150 Vascular dementia without behavioral disturbance: Secondary | ICD-10-CM | POA: Diagnosis not present

## 2017-06-21 DIAGNOSIS — M6281 Muscle weakness (generalized): Secondary | ICD-10-CM | POA: Diagnosis not present

## 2017-06-21 DIAGNOSIS — N390 Urinary tract infection, site not specified: Secondary | ICD-10-CM | POA: Diagnosis not present

## 2017-06-21 DIAGNOSIS — F341 Dysthymic disorder: Secondary | ICD-10-CM | POA: Diagnosis not present

## 2017-06-21 LAB — CBC AND DIFFERENTIAL
HEMATOCRIT: 37 (ref 36–46)
Hemoglobin: 12.1 (ref 12.0–16.0)
PLATELETS: 145 — AB (ref 150–399)
WBC: 6.1

## 2017-06-21 LAB — HEPATIC FUNCTION PANEL
ALT: 8 (ref 7–35)
AST: 12 — AB (ref 13–35)
Alkaline Phosphatase: 65 (ref 25–125)
BILIRUBIN, TOTAL: 0.4

## 2017-06-21 LAB — BASIC METABOLIC PANEL
BUN: 23 — AB (ref 4–21)
Creatinine: 0.6 (ref 0.5–1.1)
GLUCOSE: 102
Potassium: 3.8 (ref 3.4–5.3)
Sodium: 140 (ref 137–147)

## 2017-06-22 ENCOUNTER — Other Ambulatory Visit: Payer: Self-pay | Admitting: *Deleted

## 2017-06-22 LAB — CBC AND DIFFERENTIAL
HEMATOCRIT: 37 (ref 36–46)
Hemoglobin: 12.1 (ref 12.0–16.0)
Platelets: 145 — AB (ref 150–399)
WBC: 6.1

## 2017-06-23 ENCOUNTER — Encounter: Payer: Self-pay | Admitting: Nurse Practitioner

## 2017-06-24 ENCOUNTER — Encounter: Payer: Self-pay | Admitting: Nurse Practitioner

## 2017-06-24 ENCOUNTER — Non-Acute Institutional Stay (SKILLED_NURSING_FACILITY): Payer: Medicare Other | Admitting: Nurse Practitioner

## 2017-06-24 DIAGNOSIS — N390 Urinary tract infection, site not specified: Secondary | ICD-10-CM | POA: Diagnosis not present

## 2017-06-24 DIAGNOSIS — I482 Chronic atrial fibrillation, unspecified: Secondary | ICD-10-CM

## 2017-06-24 DIAGNOSIS — I10 Essential (primary) hypertension: Secondary | ICD-10-CM

## 2017-06-24 DIAGNOSIS — I4891 Unspecified atrial fibrillation: Secondary | ICD-10-CM | POA: Insufficient documentation

## 2017-06-24 NOTE — Assessment & Plan Note (Signed)
EKG 06/21/17 showed Afib, vent rate 101bpm, POA declined anticoagulation therapy, desires comfort measures. HR 90s upon my examination today. She denied chest pain, palpitation, SOB, or dizziness, no O2 desaturation,

## 2017-06-24 NOTE — Progress Notes (Signed)
Location:  [facility] Room Number: 22 Place of Service:  SNF (31) Provider:  Georgena Weisheit, Manxie  NP  Oneal GroutPandey, Mahima, MD  Patient Care Team: Oneal GroutPandey, Mahima, MD as PCP - General (Internal Medicine) Laurey MoraleMcLean, Dalton S, MD as Consulting Physician (Cardiology) Keaton Stirewalt X, NP as Nurse Practitioner (Internal Medicine)  Extended Emergency Contact Information Primary Emergency Contact: Angelo,Charles Address: [address]          [place], [place] [address] [place] Home Phone: [phone] Relation: Son Secondary Emergency Contact: Stuart,Cleleste Address: [address]          [place], [place] [address] [place] Home Phone: [phone] Mobile Phone: [phone] Relation: Daughter  Code Status:  DNR Goals of care: Advanced Directive information Advanced Directives 06/24/2017  Does Patient Have a Medical Advance Directive? Yes  Type of Advance Directive Out of facility DNR (pink MOST or yellow form)  Does patient want to make changes to medical advance directive? No - Patient declined  Copy of Healthcare Power of Attorney in Chart? -  Pre-existing out of facility DNR order (yellow form or pink MOST form) Yellow form placed in chart (order not valid for inpatient use)     Chief Complaint  Patient presents with  . Acute Visit    UTI    HPI:  Pt is a 83 y.o. female seen today for an acute visit for UTI,  06/23/17 urine culture P. Mirabilis, Augmentin 875mg  bid x 7 days, resolved visual hallucination after ABT started. Incidental finding of irregular heart beats, EKG 06/21/17 showed Afib, vent rate 101bpm, POA declined anticoagulation therapy, desires comfort measures. HR 90s upon my examination today. She denied chest pain, palpitation, SOB, or dizziness, no O2 desaturation, she is afebrile.   HTN, she is taking Metoprolol 50mg  bid to control her blood pressure.     Past Medical History:  Diagnosis Date  . Abnormality of gait  09/29/2012  . Allergic rhinitis   . Anemia, unspecified 01/12/2013  . Aortic insufficiency 04/16/2010   a. Echo 2011 - mild to moderate AI, EF 60%.  . Breast lump on left side at 7 o'clock position 07/02/2014   07/05/14 POA declined Mammogram. Desires no IVF. ABT for comfort measures only.    . Congestive heart failure, unspecified 06/30/2003  . Dementia   . Descending thoracic aortic dissection (HCC) 04/24/2010   a. MRA 2011 - begins distal to left subclavian and extends throughout the full length of the Ao.  Marland Kitchen. External hemorrhoids without mention of complication 09/29/2012  . Failure to thrive in adult 04/01/2015  . GERD (gastroesophageal reflux disease)   . Hypertension   . Hyposmolality and/or hyponatremia 09/29/2012  . IBS (irritable bowel syndrome)   . Impacted cerumen 09/29/2012  . Lumbago 10/06/2012  . Osteoarthritis    hands  . Osteoporosis   . Other abnormal blood chemistry 01/12/2013  . Otitis externa 09/29/2012  . Palpitation    a. PAC's & PVC's by Holter - 2011  . Personal history of fall 10/06/2012  . Tortuous colon 2002  . Unspecified conductive hearing loss 09/29/2012  . Unspecified constipation 09/29/2012  . Unspecified venous (peripheral) insufficiency 09/29/2012   Past Surgical History:  Procedure Laterality Date  . CATARACT EXTRACTION W/ INTRAOCULAR LENS IMPLANT    . DILATION AND CURETTAGE OF UTERUS  1960    Allergies  Allergen Reactions  . Alendronate Sodium Other (See Comments)    Reaction:  GI upset   .  Influenza Vaccines Other (See Comments)    Reaction:  Unknown   . Merthiolate [Thimerosal]     Outpatient Encounter Prescriptions as of 06/24/2017  Medication Sig  . acetaminophen (TYLENOL) 325 MG tablet Take 650 mg by mouth every 6 (six) hours as needed for mild pain, moderate pain or headache.   Marland Kitchen. amoxicillin-clavulanate (AUGMENTIN) 875-125 MG tablet Take 1 tablet by mouth 2 (two) times daily.  . DULoxetine (CYMBALTA) 30 MG capsule Take 30 mg by mouth daily.    . feeding supplement (BOOST / RESOURCE BREEZE) LIQD Take 1 Container by mouth 3 (three) times daily between meals.   Marland Kitchen. HYDROcodone-acetaminophen (NORCO/VICODIN) 5-325 MG tablet Take 1 tablet by mouth 3 (three) times daily.   . Hypromellose (ARTIFICIAL TEARS OP) Apply 1 drop to eye 2 (two) times daily. Both eyes  . lubiprostone (AMITIZA) 24 MCG capsule Take 24 mcg by mouth daily.  . Memantine HCl-Donepezil HCl (NAMZARIC) 28-10 MG CP24 Take 1 capsule by mouth at bedtime.  . metoprolol (LOPRESSOR) 50 MG tablet Take 1 tablet (50 mg total) by mouth 2 (two) times daily.  . polyethylene glycol (MIRALAX / GLYCOLAX) packet Take 17 g by mouth daily as needed for mild constipation or moderate constipation.   . saccharomyces boulardii (FLORASTOR) 250 MG capsule Take 250 mg by mouth 2 (two) times daily.  . simethicone (MYLICON) 40 MG/0.6ML drops Take 0.6 mLs (40 mg total) by mouth 4 (four) times daily as needed for flatulence.   No facility-administered encounter medications on file as of 06/24/2017.     Review of Systems  Constitutional: Negative for activity change, appetite change, chills and fever.  HENT: Positive for hearing loss. Negative for trouble swallowing and voice change.   Eyes: Negative for pain, itching and visual disturbance.  Respiratory: Negative for shortness of breath.   Cardiovascular: Negative for chest pain and palpitations.  Gastrointestinal: Positive for constipation. Negative for abdominal distention, abdominal pain, nausea and vomiting.  Genitourinary: Positive for frequency. Negative for difficulty urinating, dysuria, flank pain and hematuria.  Musculoskeletal: Positive for back pain. Negative for arthralgias.       Fall x2 prior to starting of antibiotic  Skin: Negative for rash.  Allergic/Immunologic: Negative.   Neurological: Negative for speech difficulty and headaches.       Dementia  Psychiatric/Behavioral: Positive for confusion. Negative for agitation, behavioral  problems and hallucinations. The patient is not nervous/anxious.        09/25/15 MMSE 21/30     Immunization History  Administered Date(s) Administered  . Influenza Whole 09/02/2005, 09/05/2007, 09/28/2012  . PPD Test 10/15/2013  . Pneumococcal Polysaccharide-23 11/24/2003  . Td 09/29/2002   Pertinent  Health Maintenance Due  Topic Date Due  . PNA vac Low Risk Adult (2 of 2 - PCV13) 11/23/2004  . INFLUENZA VACCINE  09/22/2017 (Originally 06/23/2017)  . DEXA SCAN  Completed   Fall Risk  08/19/2015 04/01/2015 08/31/2013  Falls in the past year? No No Yes  Number falls in past yr: - - 2 or more  Risk for fall due to : - Impaired mobility;Impaired balance/gait Impaired balance/gait   Functional Status Survey:    Vitals:   06/24/17 1209  BP: 120/60  Pulse: 67  Resp: 20  Temp: 98.2 F (36.8 C)  Weight: 98 lb 8 oz (44.7 kg)  Height: 5\' 5"  (1.651 m)   Body mass index is 16.39 kg/m. Physical Exam  Constitutional: No distress.  HENT:  Head: Normocephalic and atraumatic.  Eyes: Pupils are equal,  round, and reactive to light. Conjunctivae are normal.  Neck: Normal range of motion. Neck supple.  Cardiovascular: Normal rate.   Murmur heard. HR 90s, irregular heart beats  Pulmonary/Chest: Effort normal and breath sounds normal. She has no wheezes. She has no rales.  Has left breast mass, non tender  Abdominal: Soft. Bowel sounds are normal. She exhibits no distension and no mass. There is no tenderness. There is no rebound and no guarding.  Musculoskeletal: Normal range of motion. She exhibits no edema or tenderness.  Neurological: She is alert. No cranial nerve deficit. Coordination normal.  Alert, oriented only to self  Skin: Skin is warm and dry. She is not diaphoretic.  Psychiatric: She has a normal mood and affect. Her behavior is normal.  Confusion.    Labs reviewed:  Recent Labs  06/28/16 1549 01/12/17 04/06/17  NA 138 137 137  K 4.2 4.1 4.1  CL 99*  --   --   CO2  32  --   --   GLUCOSE 96  --   --   BUN 32* 22* 23*  CREATININE 0.66 0.7 0.5  CALCIUM 9.2  --   --     Recent Labs  06/28/16 1549 01/12/17 04/06/17  AST 14* 11* 11*  ALT 10* 8 6*  ALKPHOS 62 59 95  BILITOT 0.4  --   --   PROT 7.3  --   --   ALBUMIN 3.6  --   --     Recent Labs  06/28/16 1549 01/12/17 04/06/17 06/22/17  WBC 6.5 7.0 5.6 6.1  NEUTROABS 4.6  --   --   --   HGB 12.5 12.2 10.5* 12.1  HCT 39.5 37 32* 37  MCV 92.7  --   --   --   PLT 168 144* 181 145*   Lab Results  Component Value Date   TSH 1.00 06/23/2016   Lab Results  Component Value Date   HGBA1C 5.9 01/17/2013   Lab Results  Component Value Date   CHOL 181 01/07/2010   HDL 93.60 01/07/2010   LDLCALC 78 01/07/2010   TRIG 49.0 01/07/2010   CHOLHDL 2 01/07/2010    Significant Diagnostic Results in last 30 days:  No results found.   EKG 06/21/17 showed Afib, vent rate 101bpm, POA declined anticoagulation therapy  Assessment/Plan UTI (urinary tract infection) 06/23/17 urine culture P. Mirabilis, Augmentin 875mg  bid x 7 days, resolved visual hallucination after ABT started.    A-fib (HCC) EKG 06/21/17 showed Afib, vent rate 101bpm, POA declined anticoagulation therapy, desires comfort measures. HR 90s upon my examination today. She denied chest pain, palpitation, SOB, or dizziness, no O2 desaturation,  Hypertension Controlled, continue Metoprolol 50mg  bid     Family/ staff Communication: reviewed care plan with pt, her son and charge nurse  Labs/tests ordered: none  Time spend: 25 minutes

## 2017-06-24 NOTE — Progress Notes (Signed)
Location:  [facility] Room Number: 22 Place of Service:  SNF (31) Provider:  Mast, Manxie  NP  Oneal Grout, MD  Patient Care Team: Oneal Grout, MD as PCP - General (Internal Medicine) Laurey Morale, MD as Consulting Physician (Cardiology) Mast, Man X, NP as Nurse Practitioner (Internal Medicine)  Extended Emergency Contact Information Primary Emergency Contact: Aeschliman,Charles Address: [address]          [place], [place] [address] D[place] of [place] Home Phone: [phone] Relation: Son Secondary Emergency Contact: Stuart,Cleleste Address: [address]          [place], [place] [address] [place] of [place] Home Phone: [phone] Mobile Phone: [phone] Relation: Daughter  Code Status:  DNR Goals of care: Advanced Directive information Advanced Directives 06/24/2017  Does Patient Have a Medical Advance Directive? Yes  Type of Advance Directive Out of facility DNR (pink MOST or yellow form)  Does patient want to make changes to medical advance directive? No - Patient declined  Copy of Healthcare Power of Attorney in Chart? -  Pre-existing out of facility DNR order (yellow form or pink MOST form) Yellow form placed in chart (order not valid for inpatient use)     Chief Complaint  Patient presents with  . Acute Visit    UTI    HPI:  Pt is a 83 y.o. female seen today for an acute visit for    Past Medical History:  Diagnosis Date  . Abnormality of gait 09/29/2012  . Allergic rhinitis   . Anemia, unspecified 01/12/2013  . Aortic insufficiency 04/16/2010   a. Echo 2011 - mild to moderate AI, EF 60%.  . Breast lump on left side at 7 o'clock position 07/02/2014   07/05/14 POA declined Mammogram. Desires no IVF. ABT for comfort measures only.    . Congestive heart failure, unspecified 06/30/2003  . Dementia   . Descending thoracic aortic dissection (HCC) 04/24/2010   a. MRA 2011 - begins distal to left subclavian and extends  throughout the full length of the Ao.  Marland Kitchen External hemorrhoids without mention of complication 09/29/2012  . Failure to thrive in adult 04/01/2015  . GERD (gastroesophageal reflux disease)   . Hypertension   . Hyposmolality and/or hyponatremia 09/29/2012  . IBS (irritable bowel syndrome)   . Impacted cerumen 09/29/2012  . Lumbago 10/06/2012  . Osteoarthritis    hands  . Osteoporosis   . Other abnormal blood chemistry 01/12/2013  . Otitis externa 09/29/2012  . Palpitation    a. PAC's & PVC's by Holter - 2011  . Personal history of fall 10/06/2012  . Tortuous colon 2002  . Unspecified conductive hearing loss 09/29/2012  . Unspecified constipation 09/29/2012  . Unspecified venous (peripheral) insufficiency 09/29/2012   Past Surgical History:  Procedure Laterality Date  . CATARACT EXTRACTION W/ INTRAOCULAR LENS IMPLANT    . DILATION AND CURETTAGE OF UTERUS  1960    Allergies  Allergen Reactions  . Alendronate Sodium Other (See Comments)    Reaction:  GI upset   . Influenza Vaccines Other (See Comments)    Reaction:  Unknown   . Merthiolate [Thimerosal]     Outpatient Encounter Prescriptions as of 06/24/2017  Medication Sig  . acetaminophen (TYLENOL) 325 MG tablet Take 650 mg by mouth every 6 (six) hours as needed for mild pain, moderate pain or headache.   Marland Kitchen amoxicillin-clavulanate (AUGMENTIN) 875-125 MG tablet Take 1 tablet by mouth 2 (two) times daily.  . DULoxetine (  CYMBALTA) 30 MG capsule Take 30 mg by mouth daily.  . feeding supplement (BOOST / RESOURCE BREEZE) LIQD Take 1 Container by mouth 3 (three) times daily between meals.   Marland Kitchen. HYDROcodone-acetaminophen (NORCO/VICODIN) 5-325 MG tablet Take 1 tablet by mouth 3 (three) times daily.   . Hypromellose (ARTIFICIAL TEARS OP) Apply 1 drop to eye 2 (two) times daily. Both eyes  . lubiprostone (AMITIZA) 24 MCG capsule Take 24 mcg by mouth daily.  . Memantine HCl-Donepezil HCl (NAMZARIC) 28-10 MG CP24 Take 1 capsule by mouth at bedtime.    . metoprolol (LOPRESSOR) 50 MG tablet Take 1 tablet (50 mg total) by mouth 2 (two) times daily.  . polyethylene glycol (MIRALAX / GLYCOLAX) packet Take 17 g by mouth daily as needed for mild constipation or moderate constipation.   . saccharomyces boulardii (FLORASTOR) 250 MG capsule Take 250 mg by mouth 2 (two) times daily.  . simethicone (MYLICON) 40 MG/0.6ML drops Take 0.6 mLs (40 mg total) by mouth 4 (four) times daily as needed for flatulence.   No facility-administered encounter medications on file as of 06/24/2017.     Review of Systems  Immunization History  Administered Date(s) Administered  . Influenza Whole 09/02/2005, 09/05/2007, 09/28/2012  . PPD Test 10/15/2013  . Pneumococcal Polysaccharide-23 11/24/2003  . Td 09/29/2002   Pertinent  Health Maintenance Due  Topic Date Due  . PNA vac Low Risk Adult (2 of 2 - PCV13) 11/23/2004  . INFLUENZA VACCINE  09/22/2017 (Originally 06/23/2017)  . DEXA SCAN  Completed   Fall Risk  08/19/2015 04/01/2015 08/31/2013  Falls in the past year? No No Yes  Number falls in past yr: - - 2 or more  Risk for fall due to : - Impaired mobility;Impaired balance/gait Impaired balance/gait   Functional Status Survey:    Vitals:   06/24/17 1209  BP: 120/60  Pulse: 67  Resp: 20  Temp: 98.2 F (36.8 C)  Weight: 98 lb 8 oz (44.7 kg)  Height: 5\' 5"  (1.651 m)   Body mass index is 16.39 kg/m. Physical Exam  Labs reviewed:  Recent Labs  06/28/16 1549 01/12/17 04/06/17  NA 138 137 137  K 4.2 4.1 4.1  CL 99*  --   --   CO2 32  --   --   GLUCOSE 96  --   --   BUN 32* 22* 23*  CREATININE 0.66 0.7 0.5  CALCIUM 9.2  --   --     Recent Labs  06/28/16 1549 01/12/17 04/06/17  AST 14* 11* 11*  ALT 10* 8 6*  ALKPHOS 62 59 95  BILITOT 0.4  --   --   PROT 7.3  --   --   ALBUMIN 3.6  --   --     Recent Labs  06/28/16 1549 01/12/17 04/06/17 06/22/17  WBC 6.5 7.0 5.6 6.1  NEUTROABS 4.6  --   --   --   HGB 12.5 12.2 10.5* 12.1  HCT 39.5  37 32* 37  MCV 92.7  --   --   --   PLT 168 144* 181 145*   Lab Results  Component Value Date   TSH 1.00 06/23/2016   Lab Results  Component Value Date   HGBA1C 5.9 01/17/2013   Lab Results  Component Value Date   CHOL 181 01/07/2010   HDL 93.60 01/07/2010   LDLCALC 78 01/07/2010   TRIG 49.0 01/07/2010   CHOLHDL 2 01/07/2010    Significant Diagnostic Results in  last 30 days:  No results found.  Assessment/Plan There are no diagnoses linked to this encounter.   Family/ staff Communication:   Labs/tests ordered:

## 2017-06-24 NOTE — Assessment & Plan Note (Signed)
Controlled, continue  Metoprolol 50mg bid.  

## 2017-06-24 NOTE — Assessment & Plan Note (Signed)
06/23/17 urine culture P. Mirabilis, Augmentin 875mg  bid x 7 days, resolved visual hallucination after ABT started.

## 2017-06-30 ENCOUNTER — Emergency Department (HOSPITAL_COMMUNITY)
Admission: EM | Admit: 2017-06-30 | Discharge: 2017-07-01 | Disposition: A | Discharge status: Home / Self Care | Payer: Medicare Other | Attending: Emergency Medicine | Admitting: Emergency Medicine

## 2017-06-30 DIAGNOSIS — F0390 Unspecified dementia without behavioral disturbance: Secondary | ICD-10-CM | POA: Insufficient documentation

## 2017-06-30 DIAGNOSIS — Z79899 Other long term (current) drug therapy: Secondary | ICD-10-CM | POA: Insufficient documentation

## 2017-06-30 DIAGNOSIS — R072 Precordial pain: Secondary | ICD-10-CM | POA: Diagnosis not present

## 2017-06-30 DIAGNOSIS — I110 Hypertensive heart disease with heart failure: Secondary | ICD-10-CM | POA: Insufficient documentation

## 2017-06-30 DIAGNOSIS — I509 Heart failure, unspecified: Secondary | ICD-10-CM | POA: Diagnosis not present

## 2017-06-30 DIAGNOSIS — R079 Chest pain, unspecified: Secondary | ICD-10-CM | POA: Diagnosis not present

## 2017-07-01 ENCOUNTER — Emergency Department (HOSPITAL_COMMUNITY): Payer: Medicare Other

## 2017-07-01 ENCOUNTER — Encounter (HOSPITAL_COMMUNITY): Payer: Self-pay | Admitting: *Deleted

## 2017-07-01 ENCOUNTER — Encounter: Payer: Self-pay | Admitting: Nurse Practitioner

## 2017-07-01 ENCOUNTER — Non-Acute Institutional Stay (SKILLED_NURSING_FACILITY): Payer: Medicare Other | Admitting: Nurse Practitioner

## 2017-07-01 DIAGNOSIS — M549 Dorsalgia, unspecified: Secondary | ICD-10-CM | POA: Diagnosis not present

## 2017-07-01 DIAGNOSIS — R627 Adult failure to thrive: Secondary | ICD-10-CM

## 2017-07-01 DIAGNOSIS — N6324 Unspecified lump in the left breast, lower inner quadrant: Secondary | ICD-10-CM | POA: Diagnosis not present

## 2017-07-01 DIAGNOSIS — R079 Chest pain, unspecified: Secondary | ICD-10-CM | POA: Diagnosis not present

## 2017-07-01 DIAGNOSIS — R072 Precordial pain: Secondary | ICD-10-CM

## 2017-07-01 DIAGNOSIS — R4182 Altered mental status, unspecified: Secondary | ICD-10-CM | POA: Diagnosis not present

## 2017-07-01 DIAGNOSIS — R0789 Other chest pain: Secondary | ICD-10-CM | POA: Diagnosis not present

## 2017-07-01 LAB — CBC
HCT: 36.9 % (ref 36.0–46.0)
Hemoglobin: 11.9 g/dL — ABNORMAL LOW (ref 12.0–15.0)
MCH: 29 pg (ref 26.0–34.0)
MCHC: 32.2 g/dL (ref 30.0–36.0)
MCV: 90 fL (ref 78.0–100.0)
Platelets: 155 10*3/uL (ref 150–400)
RBC: 4.1 MIL/uL (ref 3.87–5.11)
RDW: 13.1 % (ref 11.5–15.5)
WBC: 5.9 10*3/uL (ref 4.0–10.5)

## 2017-07-01 LAB — BASIC METABOLIC PANEL
Anion gap: 8 (ref 5–15)
BUN: 24 mg/dL — AB (ref 6–20)
CALCIUM: 9.3 mg/dL (ref 8.9–10.3)
CO2: 29 mmol/L (ref 22–32)
CREATININE: 0.57 mg/dL (ref 0.44–1.00)
Chloride: 98 mmol/L — ABNORMAL LOW (ref 101–111)
Glucose, Bld: 100 mg/dL — ABNORMAL HIGH (ref 65–99)
Potassium: 4.7 mmol/L (ref 3.5–5.1)
SODIUM: 135 mmol/L (ref 135–145)

## 2017-07-01 LAB — TROPONIN I: Troponin I: <0.03 ng/mL (ref <0.03)

## 2017-07-01 NOTE — ED Notes (Signed)
pts daughter susan coggins  Cell [phone]  Home number [phone]

## 2017-07-01 NOTE — ED Notes (Signed)
To x-ray

## 2017-07-01 NOTE — ED Provider Notes (Signed)
[facility]-EMERGENCY DEPT Provider Note   CSN: [id number] Arrival date & time: 06/30/17  2358   By signing my name below, I, Clarisse Gouge, attest that this documentation has been prepared under the direction and in the presence of Azalia Bilis, MD. Electronically signed, Clarisse Gouge, ED Scribe. 07/01/17. 12:59 AM.   History   Chief Complaint Chief Complaint  Patient presents with  . Chest Pain   The history is provided by the patient, medical records and a relative. No language interpreter was used.    Level V caveat: Dementia  Jamie [name] is a 83 y.o. female with h/o arthritis, HTN, CHF and dementia BIB EMS to the Emergency Department concerning L chest pain onset ~2 hours prior to evaluation. Pt c/o dry mouth sensation on evaluation. 5/10, constant aching reported prior to evaluation. Son states he was told the pt had an irregular heart beat 1 week ago. UTI diagnosis, hallucinations and multiple falls noted within the last 2 weeks. H/o thoracic aneurysm reported. No h/o heart attack, HTN, HLD, DM, blood clots. No h/o tobacco use. No other complaints at this time.   Past Medical History:  Diagnosis Date  . Abnormality of gait 09/29/2012  . Allergic rhinitis   . Anemia, unspecified 01/12/2013  . Aortic insufficiency 04/16/2010   a. Echo 2011 - mild to moderate AI, EF 60%.  . Breast lump on left side at 7 o'clock position 07/02/2014   07/05/14 POA declined Mammogram. Desires no IVF. ABT for comfort measures only.    . Congestive heart failure, unspecified 06/30/2003  . Dementia   . Descending thoracic aortic dissection (HCC) 04/24/2010   a. MRA 2011 - begins distal to left subclavian and extends throughout the full length of the Ao.  Marland Kitchen External hemorrhoids without mention of complication 09/29/2012  . Failure to thrive in adult 04/01/2015  . GERD (gastroesophageal reflux disease)   . Hypertension   . Hyposmolality and/or hyponatremia 09/29/2012  . IBS (irritable bowel syndrome)   .  Impacted cerumen 09/29/2012  . Lumbago 10/06/2012  . Osteoarthritis    hands  . Osteoporosis   . Other abnormal blood chemistry 01/12/2013  . Otitis externa 09/29/2012  . Palpitation    a. PAC's & PVC's by Holter - 2011  . Personal history of fall 10/06/2012  . Tortuous colon 2002  . Unspecified conductive hearing loss 09/29/2012  . Unspecified constipation 09/29/2012  . Unspecified venous (peripheral) insufficiency 09/29/2012    Patient Active Problem List   Diagnosis Date Noted  . A-fib (HCC) 06/24/2017  . Arrhythmia 06/18/2017  . Noninfectious gastroenteritis and colitis 06/29/2016  . Colitis 06/29/2016  . Adult failure to thrive 10/30/2015  . Loss of weight 10/01/2014  . Breast lump on left side at 7 o'clock position 07/02/2014  . Left hip pain 06/04/2014  . Depression with anxiety 12/06/2013  . Gait disorder 09/29/2013  . Fall 08/31/2013  . GERD (gastroesophageal reflux disease) 07/03/2013  . UTI (urinary tract infection) 05/29/2013  . DNR (do not resuscitate) 04/13/2013  . Hypertension   . Constipation 05/25/2011  . Descending thoracic aortic dissection (HCC) 04/24/2010  . Aortic insufficiency 04/16/2010  . CARDIOMEGALY 03/12/2010  . PALPITATIONS, HX OF 02/03/2010  . Vitamin D deficiency 01/07/2010  . DISTURBANCE, VISUAL NOS 06/27/2007  . OSTEOARTHROSIS, GENERALIZED, MULTIPLE SITES 06/27/2007  . Dementia, vascular 03/07/2007  . Allergic rhinitis 03/07/2007  . IRRITABLE BOWEL SYNDROME 03/07/2007  . OVERACTIVE BLADDER 03/07/2007  . OSTEOPOROSIS 03/07/2007    Past Surgical History:  Procedure  Laterality Date  . CATARACT EXTRACTION W/ INTRAOCULAR LENS IMPLANT    . DILATION AND CURETTAGE OF UTERUS  1960    OB History    No data available       Home Medications    Prior to Admission medications   Medication Sig Start Date End Date Taking? Authorizing Provider  acetaminophen (TYLENOL) 325 MG tablet Take 650 mg by mouth every 6 (six) hours as needed for mild  pain, moderate pain or headache.     [provider]  DULoxetine (CYMBALTA) 30 MG capsule Take 30 mg by mouth daily.    [provider]  feeding supplement (BOOST / RESOURCE BREEZE) LIQD Take 1 Container by mouth 3 (three) times daily between meals.     [provider]  HYDROcodone-acetaminophen (NORCO/VICODIN) 5-325 MG tablet Take 1 tablet by mouth 3 (three) times daily.     [provider]  Hypromellose (ARTIFICIAL TEARS OP) Apply 1 drop to eye 2 (two) times daily. Both eyes    [provider]  lubiprostone (AMITIZA) 24 MCG capsule Take 24 mcg by mouth daily.    [provider]  Memantine HCl-Donepezil HCl (NAMZARIC) 28-10 MG CP24 Take 1 capsule by mouth at bedtime.    [provider]  metoprolol (LOPRESSOR) 50 MG tablet Take 1 tablet (50 mg total) by mouth 2 (two) times daily. 03/30/12   Ok Anis, NP  polyethylene glycol (MIRALAX / GLYCOLAX) packet Take 17 g by mouth daily as needed for mild constipation or moderate constipation.     [provider]  saccharomyces boulardii (FLORASTOR) 250 MG capsule Take 250 mg by mouth 2 (two) times daily. 06/23/17 07/02/17  [provider]  simethicone (MYLICON) 40 MG/0.6ML drops Take 0.6 mLs (40 mg total) by mouth 4 (four) times daily as needed for flatulence. 06/28/16   Alvira Monday, MD    Family History Family History  Problem Relation Age of Onset  . Diabetes Mother   . Coronary artery disease Father   . Hypertension Father   . Diabetes Unknown        sisters    Social History Social History  Substance Use Topics  . Smoking status: Never Smoker  . Smokeless tobacco: Never Used  . Alcohol use No     Allergies   Alendronate sodium; Influenza vaccines; and Merthiolate [thimerosal]   Review of Systems Review of Systems  Unable to perform ROS: Dementia  Skin: Positive for color change. Negative for wound.     Physical Exam Updated Vital Signs Ht  5\' 5"  (1.651 m)   Wt 98 lb (44.5 kg)   BMI 16.31 kg/m   Physical Exam  Constitutional: She appears well-developed and well-nourished.  HENT:  Head: Normocephalic and atraumatic.  Eyes: EOM are normal.  Neck: Normal range of motion.  Cardiovascular: Normal rate and normal heart sounds.  An irregular rhythm present.  Pulmonary/Chest: Effort normal and breath sounds normal.  Soft tissue mass involving nipple and areola without erythema of L breast  Abdominal: Soft. She exhibits no distension. There is no tenderness.  Musculoskeletal: Normal range of motion.  Neurological: She is alert.  Skin: Skin is warm and dry.  Psychiatric: She has a normal mood and affect. Judgment normal.  Nursing note and vitals reviewed.    ED Treatments / Results  DIAGNOSTIC STUDIES:     COORDINATION OF CARE:    Labs (all labs ordered are listed, but only abnormal results are displayed) Labs Reviewed  BASIC METABOLIC  PANEL - Abnormal; Notable for the following:       Result Value   Chloride 98 (*)    Glucose, Bld 100 (*)    BUN 24 (*)    All other components within normal limits  CBC - Abnormal; Notable for the following:    Hemoglobin 11.9 (*)    All other components within normal limits  TROPONIN I    EKG  EKG Interpretation  Date/Time:  Thursday July 01 2017 00:09:56 EDT Ventricular Rate:  86 PR Interval:    QRS Duration: 86 QT Interval:  392 QTC Calculation: 485 R Axis:   -67 Text Interpretation:  Atrial fibrillation Left anterior fascicular block Abnormal R-wave progression, early transition Nonspecific T abnormalities, lateral leads Baseline wander in lead(s) V2 Reconfirmed by Azalia Bilisampos, Rayshawn Visconti ([id number]) on 07/01/2017 12:57:08 AM       Radiology Dg Chest 2 View  Result Date: 07/01/2017 CLINICAL DATA:  Acute onset of left upper chest pain. Initial encounter. EXAM: CHEST  2 VIEW COMPARISON:  Chest radiograph performed 06/28/2016 FINDINGS: The lungs are well-aerated. Mild left basilar  atelectasis is noted. Peribronchial thickening is seen. There is no evidence of pleural effusion or pneumothorax. The heart is mildly enlarged. There is aneurysmal dilatation of the ascending thoracic aorta. No acute osseous abnormalities are seen. IMPRESSION: 1. Mild left basilar atelectasis noted. Peribronchial thickening seen. 2. Mild cardiomegaly. Aneurysmal dilatation of the ascending thoracic aorta, as previously noted. Electronically Signed   By: Roanna RaiderJeffery  Chang M.D.   On: 07/01/2017 00:43    Procedures Procedures (including critical care time)  Medications Ordered in ED Medications - No data to display   Initial Impression / Assessment and Plan / ED Course  I have reviewed the triage vital signs and the nursing notes.  Pertinent labs & imaging results that were available during my care of the patient were reviewed by me and considered in my medical decision making (see chart for details).     Patient is overall well-appearing.  Vital signs are stable.  She appears comfortable at this time.  She has advanced age at age 83.  Her son is present and states the is more focused on quality of life at this point and reports that she is near [facility] only.  He was agreeable to a basic ER evaluation of chest discomfort today.  She does have a known left breast mass.  This deftly could represent cancer.  He understands that.  She does seem to point to this area in regards to her pain.  Her basic workup.  Emergency department is without significant abnormality.  She will be discharged home in good condition to follow-up with her primary care physician.  Patient's son is agreeable to this minimal workup in the ER  Final Clinical Impressions(s) / ED Diagnoses   Final diagnoses:  Precordial pain    New Prescriptions New Prescriptions   No medications on file   I personally performed the services described in this documentation, which was scribed in my presence. The recorded information has  been reviewed and is accurate.        Azalia Bilisampos, Reighlynn Swiney, MD 07/01/17 [phone]

## 2017-07-01 NOTE — ED Triage Notes (Signed)
The [facility].  The pt has been c/o lt upper chest pain  For 1 1/2 hours  She has dementia and she is very hard of hearing she is c/o aching all over.  Her rt pupil is chronically larger than the  Lt pupil  Iv per ems no n v or diarrhea

## 2017-07-01 NOTE — Progress Notes (Signed)
Location:  [facility] Room Number: 22 Place of Service:  SNF (31) Provider: Arna Snipe Bearl Talarico NP  Oneal Grout, MD  Patient Care Team: Oneal Grout, MD as PCP - General (Internal Medicine) Laurey Morale, MD as Consulting Physician (Cardiology) Kymani Shimabukuro X, NP as Nurse Practitioner (Internal Medicine)  Extended Emergency Contact Information Primary Emergency Contact: Pizzolato,Charles Address: [address]          [place], [place] [address] D[place] of [place] Home Phone: [phone] Relation: Son Secondary Emergency Contact: Stuart,Cleleste Address: [address]          [place], [place] [address] [place] of [place] Home Phone: [phone] Mobile Phone: [phone] Relation: Daughter  Code Status:  DNR Goals of care: Advanced Directive information Advanced Directives 07/01/2017  Does Patient Have a Medical Advance Directive? Yes  Type of Estate agent of [place];Out of facility DNR (pink MOST or yellow form)  Does patient want to make changes to medical advance directive? No - Patient declined  Copy of Healthcare Power of Attorney in Chart? Yes  Pre-existing out of facility DNR order (yellow form or pink MOST form) Yellow form placed in chart (order not valid for inpatient use)     Chief Complaint  Patient presents with  . Acute Visit    chest pain     HPI:  Pt is a 83 y.o. female seen today for an acute visit for ED evaluation 8/8//18 for chest pain, the patient presented to ED with complaining pain in the left arm radiating to chest for 2 hours. In ED 06/30/17, EKG showed Afib, vent rate 86, nonspecific T abnormalities. Her goal of care is near comfort measures, her son agreed with minimal workup in the ED. 07/01/17 CXR 1. Mild left basilar atelectasis noted. Peribronchial thickening seen. 2. Mild cardiomegaly. Aneurysmal dilatation of the ascending thoracic aorta, as previously noted. The patient has no c/o chest  pain upon my visit. She denied palpitation, she is afebrile, no O2 desaturation.    The patient hs chronic mid back pain, in her shoulder blades area, has been taking Norco bid and prn, given hx of left breast mass and thoracic aneurysm, the patient's POA desires comfort measures and Hospice consultation.    Hx of left breast mass, the patient complaining pain in the area frequently. Recent falls and hallucinations associated with onset of UTI(7 day course Augmentin completed 06/29/17)   Hx of GERD, not taking acid reducer, denied heart burns or indigestion. Constipation is managed with Amitiza qd, prn MiraLax, last BM 07/01/17. Blood pressure is controlled, taking Metoprolol 50mg  bid.    Past Medical History:  Diagnosis Date  . Abnormality of gait 09/29/2012  . Allergic rhinitis   . Anemia, unspecified 01/12/2013  . Aortic insufficiency 04/16/2010   a. Echo 2011 - mild to moderate AI, EF 60%.  . Breast lump on left side at 7 o'clock position 07/02/2014   07/05/14 POA declined Mammogram. Desires no IVF. ABT for comfort measures only.    . Congestive heart failure, unspecified 06/30/2003  . Dementia   . Descending thoracic aortic dissection (HCC) 04/24/2010   a. MRA 2011 - begins distal to left subclavian and extends throughout the full length of the Ao.  Marland Kitchen External hemorrhoids without mention of complication 09/29/2012  . Failure to thrive in adult 04/01/2015  . GERD (gastroesophageal reflux disease)   . Hypertension   . Hyposmolality and/or hyponatremia 09/29/2012  . IBS (irritable bowel syndrome)   .  Impacted cerumen 09/29/2012  . Lumbago 10/06/2012  . Osteoarthritis    hands  . Osteoporosis   . Other abnormal blood chemistry 01/12/2013  . Otitis externa 09/29/2012  . Palpitation    a. PAC's & PVC's by Holter - 2011  . Personal history of fall 10/06/2012  . Tortuous colon 2002  . Unspecified conductive hearing loss 09/29/2012  . Unspecified constipation 09/29/2012  . Unspecified venous  (peripheral) insufficiency 09/29/2012   Past Surgical History:  Procedure Laterality Date  . CATARACT EXTRACTION W/ INTRAOCULAR LENS IMPLANT    . DILATION AND CURETTAGE OF UTERUS  1960    Allergies  Allergen Reactions  . Alendronate Sodium Other (See Comments)    Reaction:  GI upset   . Benzalkonium Chloride Other (See Comments)    "On MAR"  . Influenza Vaccines Other (See Comments)    Reaction:  Unknown   . Merthiolate [Thimerosal]     Outpatient Encounter Prescriptions as of 07/01/2017  Medication Sig  . acetaminophen (TYLENOL) 325 MG tablet Take 650 mg by mouth every 6 (six) hours as needed for mild pain, moderate pain or headache.   . DULoxetine (CYMBALTA) 30 MG capsule Take 30 mg by mouth daily.  . feeding supplement (BOOST / RESOURCE BREEZE) LIQD Take 1 Container by mouth 3 (three) times daily between meals.   Marland Kitchen. HYDROcodone-acetaminophen (NORCO/VICODIN) 5-325 MG tablet Take 1 tablet by mouth 2 (two) times daily.   Marland Kitchen. HYDROcodone-acetaminophen (NORCO/VICODIN) 5-325 MG tablet Take 1 tablet by mouth every 6 (six) hours as needed for moderate pain.  . Hypromellose (ARTIFICIAL TEARS OP) Apply 1 drop to eye 2 (two) times daily. Both eyes  . lubiprostone (AMITIZA) 24 MCG capsule Take 24 mcg by mouth daily.  . Memantine HCl-Donepezil HCl (NAMZARIC) 28-10 MG CP24 Take 1 capsule by mouth at bedtime.  . metoprolol (LOPRESSOR) 50 MG tablet Take 1 tablet (50 mg total) by mouth 2 (two) times daily.  . polyethylene glycol (MIRALAX / GLYCOLAX) packet Take 17 g by mouth daily as needed for mild constipation or moderate constipation.   . saccharomyces boulardii (FLORASTOR) 250 MG capsule Take 250 mg by mouth 2 (two) times daily.  . simethicone (MYLICON) 40 MG/0.6ML drops Take 0.6 mLs (40 mg total) by mouth 4 (four) times daily as needed for flatulence.   No facility-administered encounter medications on file as of 07/01/2017.     Review of Systems  Constitutional: Negative for activity change,  appetite change, chills, fatigue and fever.       ROS is provided by the patient with assistance of staff.   HENT: Positive for hearing loss. Negative for trouble swallowing and voice change.   Eyes: Negative for visual disturbance.  Respiratory: Negative for shortness of breath.   Cardiovascular: Negative for chest pain and palpitations.  Gastrointestinal: Negative for abdominal distention, abdominal pain, constipation, nausea and vomiting.       Denied heart burns or indigestion.   Genitourinary: Positive for frequency. Negative for difficulty urinating, dysuria, flank pain and hematuria.  Musculoskeletal: Positive for back pain. Negative for arthralgias.       Fall x2 prior to starting of antibiotic for UTI Chronic mid back pain in her shoulder blades area  Allergic/Immunologic: Negative.   Neurological: Negative for speech difficulty.       Dementia  Psychiatric/Behavioral: Positive for confusion. Negative for agitation, behavioral problems and hallucinations. The patient is not nervous/anxious.        09/25/15 MMSE 21/30     Immunization History  Administered Date(s) Administered  . Influenza Whole 09/02/2005, 09/05/2007, 09/28/2012  . PPD Test 10/15/2013  . Pneumococcal Polysaccharide-23 11/24/2003  . Td 09/29/2002   Pertinent  Health Maintenance Due  Topic Date Due  . PNA vac Low Risk Adult (2 of 2 - PCV13) 11/23/2004  . INFLUENZA VACCINE  09/22/2017 (Originally 06/23/2017)  . DEXA SCAN  Completed   Fall Risk  08/19/2015 04/01/2015 08/31/2013  Falls in the past year? No No Yes  Number falls in past yr: - - 2 or more  Risk for fall due to : - Impaired mobility;Impaired balance/gait Impaired balance/gait   Functional Status Survey:    Vitals:   07/01/17 1251  BP: 118/60  Pulse: 61  Resp: 20  Temp: 99.2 F (37.3 C)  SpO2: 94%  Weight: 98 lb 8 oz (44.7 kg)  Height: 5\' 5"  (1.651 m)   Body mass index is 16.39 kg/m. Physical Exam  Constitutional: No distress.  Thin,  frail.   HENT:  Head: Normocephalic and atraumatic.  Eyes: Pupils are equal, round, and reactive to light. Conjunctivae are normal.  Neck: Normal range of motion. Neck supple.  Cardiovascular: Normal rate.   Murmur heard. HR 90s, irregular heart beats  Pulmonary/Chest: Effort normal and breath sounds normal. She has no wheezes. She has no rales.  Has left breast mass, non tender  Abdominal: Soft. Bowel sounds are normal. She exhibits no distension. There is no tenderness.  Musculoskeletal: Normal range of motion. She exhibits tenderness. She exhibits no edema.  Pain palpated in her middle back.   Neurological: She is alert. No cranial nerve deficit. Coordination normal.  Alert, oriented only to self  Skin: Skin is warm and dry. She is not diaphoretic.  Left breast mass, a large marble size, mobile, tender, hard felt  like forehead  Psychiatric: She has a normal mood and affect. Her behavior is normal.  Confusion.    Labs reviewed:  Recent Labs  01/12/17 04/06/17 07/01/17 0025  NA 137 137 135  K 4.1 4.1 4.7  CL  --   --  98*  CO2  --   --  29  GLUCOSE  --   --  100*  BUN 22* 23* 24*  CREATININE 0.7 0.5 0.57  CALCIUM  --   --  9.3    Recent Labs  01/12/17 04/06/17  AST 11* 11*  ALT 8 6*  ALKPHOS 59 95    Recent Labs  04/06/17 06/22/17 07/01/17 0025  WBC 5.6 6.1 5.9  HGB 10.5* 12.1 11.9*  HCT 32* 37 36.9  MCV  --   --  90.0  PLT 181 145* 155   Lab Results  Component Value Date   TSH 1.00 06/23/2016   Lab Results  Component Value Date   HGBA1C 5.9 01/17/2013   Lab Results  Component Value Date   CHOL 181 01/07/2010   HDL 93.60 01/07/2010   LDLCALC 78 01/07/2010   TRIG 49.0 01/07/2010   CHOLHDL 2 01/07/2010    Significant Diagnostic Results in last 30 days:  Dg Chest 2 View  Result Date: 07/01/2017 CLINICAL DATA:  Acute onset of left upper chest pain. Initial encounter. EXAM: CHEST  2 VIEW COMPARISON:  Chest radiograph performed 06/28/2016 FINDINGS:  The lungs are well-aerated. Mild left basilar atelectasis is noted. Peribronchial thickening is seen. There is no evidence of pleural effusion or pneumothorax. The heart is mildly enlarged. There is aneurysmal dilatation of the ascending thoracic aorta. No acute osseous abnormalities are seen. IMPRESSION:  1. Mild left basilar atelectasis noted. Peribronchial thickening seen. 2. Mild cardiomegaly. Aneurysmal dilatation of the ascending thoracic aorta, as previously noted. Electronically Signed   By: Roanna Raider M.D.   On: 07/01/2017 00:43    Assessment/Plan Precordial pain ED evaluation 8/8//18 for chest pain, the patient presented to ED with complaining pain in the left arm radiating to chest x 2 hours. In ED 06/30/17, EKG showed Afib, vent rate 86, nonspecific T abnormalities. Her goal of care is near comfort measures, her son agreed with minimal workup in the ED. 07/01/17 CXR 1. Mild left basilar atelectasis noted. Peribronchial thickening seen. 2. Mild cardiomegaly. Aneurysmal dilatation of the ascending thoracic aorta, as previously noted. The patient has no c/o chest pain upon my visit. She denied palpitation, she is afebrile, no O2 desaturation.   Breast lump on left side at 7 o'clock position 07/05/14 POA declined Mammogram. Desires no IVF. ABT for comfort measures only.   Mid back pain The patient hs chronic mid back pain, in her shoulder blades area, has been taking Norco bid and prn, will increase Norco 5/325mg  tid for better pain control.   Adult failure to thrive On going since 2015 when her left breast mass was noted and progression of dementia, Continue supportive/comfort  measures. Hospice referral.      Family/ staff Communication: plan of care reviewed with the patient and charge nurse. Hospice referral.   Labs/tests ordered:  None  Time spend 45 minutes

## 2017-07-01 NOTE — ED Notes (Signed)
Back to [facility]

## 2017-07-01 NOTE — ED Notes (Signed)
Unable to give report  [facility] transfers  And then a recording  I had to leave a message

## 2017-07-02 ENCOUNTER — Non-Acute Institutional Stay (SKILLED_NURSING_FACILITY): Payer: Medicare Other

## 2017-07-02 DIAGNOSIS — Z0000 Encounter for general adult medical examination without abnormal findings: Secondary | ICD-10-CM | POA: Diagnosis not present

## 2017-07-02 DIAGNOSIS — R072 Precordial pain: Secondary | ICD-10-CM | POA: Insufficient documentation

## 2017-07-02 NOTE — Patient Instructions (Signed)
Jamie [name] , Thank you for taking time to come for your Medicare Wellness Visit. I appreciate your ongoing commitment to your health goals. Please review the following plan we discussed and let me know if I can assist you in the future.   Screening recommendations/referrals: Colonoscopy excluded, pt over age 10575 Mammogram excluded, pt over age 81 Bone Density up to date Recommended yearly ophthalmology/optometry visit for glaucoma screening and checkup Recommended yearly dental visit for hygiene and checkup  Vaccinations: Influenza vaccine due 2018 fall season Pneumococcal vaccine 13 due Tdap vaccine due Shingles vaccine not in records    Advanced directives: DNR in chart, copies of health care power of attorney and living will are needed   Conditions/risks identified: None  Next appointment: Dr. Glade LloydPandey makes rounds   Preventive Care 65 Years and Older, Female Preventive care refers to lifestyle choices and visits with your health care provider that can promote health and wellness. What does preventive care include?  A yearly physical exam. This is also called an annual well check.  Dental exams once or twice a year.  Routine eye exams. Ask your health care provider how often you should have your eyes checked.  Personal lifestyle choices, including:  Daily care of your teeth and gums.  Regular physical activity.  Eating a healthy diet.  Avoiding tobacco and drug use.  Limiting alcohol use.  Practicing safe sex.  Taking low-dose aspirin every day.  Taking vitamin and mineral supplements as recommended by your health care provider. What happens during an annual well check? The services and screenings done by your health care provider during your annual well check will depend on your age, overall health, lifestyle risk factors, and family history of disease. Counseling  Your health care provider may ask you questions about your:  Alcohol use.  Tobacco use.  Drug  use.  Emotional well-being.  Home and relationship well-being.  Sexual activity.  Eating habits.  History of falls.  Memory and ability to understand (cognition).  Work and work Astronomerenvironment.  Reproductive health. Screening  You may have the following tests or measurements:  Height, weight, and BMI.  Blood pressure.  Lipid and cholesterol levels. These may be checked every 5 years, or more frequently if you are over 81 years old.  Skin check.  Lung cancer screening. You may have this screening every year starting at age 81 if you have a 30-pack-year history of smoking and currently smoke or have quit within the past 15 years.  Fecal occult blood test (FOBT) of the stool. You may have this test every year starting at age 81.  Flexible sigmoidoscopy or colonoscopy. You may have a sigmoidoscopy every 5 years or a colonoscopy every 10 years starting at age 81.  Hepatitis C blood test.  Hepatitis B blood test.  Sexually transmitted disease (STD) testing.  Diabetes screening. This is done by checking your blood sugar (glucose) after you have not eaten for a while (fasting). You may have this done every 1-3 years.  Bone density scan. This is done to screen for osteoporosis. You may have this done starting at age 81.  Mammogram. This may be done every 1-2 years. Talk to your health care provider about how often you should have regular mammograms. Talk with your health care provider about your test results, treatment options, and if necessary, the need for more tests. Vaccines  Your health care provider may recommend certain vaccines, such as:  Influenza vaccine. This is recommended every year.  Tetanus, diphtheria, and acellular pertussis (Tdap, Td) vaccine. You may need a Td booster every 10 years.  Zoster vaccine. You may need this after age 44.  Pneumococcal 13-valent conjugate (PCV13) vaccine. One dose is recommended after age 55.  Pneumococcal polysaccharide  (PPSV23) vaccine. One dose is recommended after age 5. Talk to your health care provider about which screenings and vaccines you need and how often you need them. This information is not intended to replace advice given to you by your health care provider. Make sure you discuss any questions you have with your health care provider. Document Released: 12/06/2015 Document Revised: 07/29/2016 Document Reviewed: 09/10/2015 Elsevier Interactive Patient Education  2017 [place] Prevention in the Home Falls can cause injuries. They can happen to people of all ages. There are many things you can do to make your home safe and to help prevent falls. What can I do on the outside of my home?  Regularly fix the edges of walkways and driveways and fix any cracks.  Remove anything that might make you trip as you walk through a door, such as a raised step or threshold.  Trim any bushes or trees on the path to your home.  Use bright outdoor lighting.  Clear any walking paths of anything that might make someone trip, such as rocks or tools.  Regularly check to see if handrails are loose or broken. Make sure that both sides of any steps have handrails.  Any raised decks and porches should have guardrails on the edges.  Have any leaves, snow, or ice cleared regularly.  Use sand or salt on walking paths during winter.  Clean up any spills in your garage right away. This includes oil or grease spills. What can I do in the bathroom?  Use night lights.  Install grab bars by the toilet and in the tub and shower. Do not use towel bars as grab bars.  Use non-skid mats or decals in the tub or shower.  If you need to sit down in the shower, use a plastic, non-slip stool.  Keep the floor dry. Clean up any water that spills on the floor as soon as it happens.  Remove soap buildup in the tub or shower regularly.  Attach bath mats securely with double-sided non-slip rug tape.  Do not have  throw rugs and other things on the floor that can make you trip. What can I do in the bedroom?  Use night lights.  Make sure that you have a light by your bed that is easy to reach.  Do not use any sheets or blankets that are too big for your bed. They should not hang down onto the floor.  Have a firm chair that has side arms. You can use this for support while you get dressed.  Do not have throw rugs and other things on the floor that can make you trip. What can I do in the kitchen?  Clean up any spills right away.  Avoid walking on wet floors.  Keep items that you use a lot in easy-to-reach places.  If you need to reach something above you, use a strong step stool that has a grab bar.  Keep electrical cords out of the way.  Do not use floor polish or wax that makes floors slippery. If you must use wax, use non-skid floor wax.  Do not have throw rugs and other things on the floor that can make you trip. What can I do  with my stairs?  Do not leave any items on the stairs.  Make sure that there are handrails on both sides of the stairs and use them. Fix handrails that are broken or loose. Make sure that handrails are as long as the stairways.  Check any carpeting to make sure that it is firmly attached to the stairs. Fix any carpet that is loose or worn.  Avoid having throw rugs at the top or bottom of the stairs. If you do have throw rugs, attach them to the floor with carpet tape.  Make sure that you have a light switch at the top of the stairs and the bottom of the stairs. If you do not have them, ask someone to add them for you. What else can I do to help prevent falls?  Wear shoes that:  Do not have high heels.  Have rubber bottoms.  Are comfortable and fit you well.  Are closed at the toe. Do not wear sandals.  If you use a stepladder:  Make sure that it is fully opened. Do not climb a closed stepladder.  Make sure that both sides of the stepladder are  locked into place.  Ask someone to hold it for you, if possible.  Clearly mark and make sure that you can see:  Any grab bars or handrails.  First and last steps.  Where the edge of each step is.  Use tools that help you move around (mobility aids) if they are needed. These include:  Canes.  Walkers.  Scooters.  Crutches.  Turn on the lights when you go into a dark area. Replace any light bulbs as soon as they burn out.  Set up your furniture so you have a clear path. Avoid moving your furniture around.  If any of your floors are uneven, fix them.  If there are any pets around you, be aware of where they are.  Review your medicines with your doctor. Some medicines can make you feel dizzy. This can increase your chance of falling. Ask your doctor what other things that you can do to help prevent falls. This information is not intended to replace advice given to you by your health care provider. Make sure you discuss any questions you have with your health care provider. Document Released: 09/05/2009 Document Revised: 04/16/2016 Document Reviewed: 12/14/2014 Elsevier Interactive Patient Education  2017 [employer].

## 2017-07-02 NOTE — Progress Notes (Signed)
Subjective:   Jamie [name] is a 83 y.o. female who presents for an Initial Medicare Annual Wellness Visit at [facility]; incapacitated patient unable to answer questions appropriately       Objective:    Today's Vitals   07/02/17 1145 07/02/17 1147  BP: 120/65   Pulse: 65   Temp: (!) 97.5 F (36.4 C)   TempSrc: Oral   SpO2: 97%   Weight: 93 lb (42.2 kg)   Height: 5\' 4"  (1.626 m)   PainSc:  2    Body mass index is 15.96 kg/m.   Current Medications (verified) Outpatient Encounter Prescriptions as of 07/02/2017  Medication Sig  . acetaminophen (TYLENOL) 325 MG tablet Take 650 mg by mouth every 6 (six) hours as needed for mild pain, moderate pain or headache.   . DULoxetine (CYMBALTA) 30 MG capsule Take 30 mg by mouth daily.  . feeding supplement (BOOST / RESOURCE BREEZE) LIQD Take 1 Container by mouth 3 (three) times daily between meals.   Marland Kitchen HYDROcodone-acetaminophen (NORCO/VICODIN) 5-325 MG tablet Take 1 tablet by mouth 2 (two) times daily.   Marland Kitchen HYDROcodone-acetaminophen (NORCO/VICODIN) 5-325 MG tablet Take 1 tablet by mouth every 6 (six) hours as needed for moderate pain.  . Hypromellose (ARTIFICIAL TEARS OP) Apply 1 drop to eye 2 (two) times daily. Both eyes  . lubiprostone (AMITIZA) 24 MCG capsule Take 24 mcg by mouth daily.  . Memantine HCl-Donepezil HCl (NAMZARIC) 28-10 MG CP24 Take 1 capsule by mouth at bedtime.  . metoprolol (LOPRESSOR) 50 MG tablet Take 1 tablet (50 mg total) by mouth 2 (two) times daily.  . polyethylene glycol (MIRALAX / GLYCOLAX) packet Take 17 g by mouth daily as needed for mild constipation or moderate constipation.   . saccharomyces boulardii (FLORASTOR) 250 MG capsule Take 250 mg by mouth 2 (two) times daily.  . simethicone (MYLICON) 40 MG/0.6ML drops Take 0.6 mLs (40 mg total) by mouth 4 (four) times daily as needed for flatulence.   No facility-administered encounter medications on file as of 07/02/2017.     Allergies  (verified) Alendronate sodium; Benzalkonium chloride; Influenza vaccines; and Merthiolate [thimerosal]   History: Past Medical History:  Diagnosis Date  . Abnormality of gait 09/29/2012  . Allergic rhinitis   . Anemia, unspecified 01/12/2013  . Aortic insufficiency 04/16/2010   a. Echo 2011 - mild to moderate AI, EF 60%.  . Breast lump on left side at 7 o'clock position 07/02/2014   07/05/14 POA declined Mammogram. Desires no IVF. ABT for comfort measures only.    . Congestive heart failure, unspecified 06/30/2003  . Dementia   . Descending thoracic aortic dissection (HCC) 04/24/2010   a. MRA 2011 - begins distal to left subclavian and extends throughout the full length of the Ao.  Marland Kitchen External hemorrhoids without mention of complication 09/29/2012  . Failure to thrive in adult 04/01/2015  . GERD (gastroesophageal reflux disease)   . Hypertension   . Hyposmolality and/or hyponatremia 09/29/2012  . IBS (irritable bowel syndrome)   . Impacted cerumen 09/29/2012  . Lumbago 10/06/2012  . Osteoarthritis    hands  . Osteoporosis   . Other abnormal blood chemistry 01/12/2013  . Otitis externa 09/29/2012  . Palpitation    a. PAC's & PVC's by Holter - 2011  . Personal history of fall 10/06/2012  . Tortuous colon 2002  . Unspecified conductive hearing loss 09/29/2012  . Unspecified constipation 09/29/2012  . Unspecified venous (peripheral) insufficiency 09/29/2012  Past Surgical History:  Procedure Laterality Date  . CATARACT EXTRACTION W/ INTRAOCULAR LENS IMPLANT    . DILATION AND CURETTAGE OF UTERUS  1960   Family History  Problem Relation Age of Onset  . Diabetes Mother   . Coronary artery disease Father   . Hypertension Father   . Diabetes Unknown        sisters   Social History   Occupational History  . Not on file.   Social History Main Topics  . Smoking status: Never Smoker  . Smokeless tobacco: Never Used  . Alcohol use No  . Drug use: No  . Sexual activity: No    Tobacco  Counseling Counseling given: Not Answered   Activities of Daily Living In your present state of health, do you have any difficulty performing the following activities: 07/02/2017  Hearing? Y  Vision? Y  Difficulty concentrating or making decisions? Y  Walking or climbing stairs? Y  Dressing or bathing? Y  Doing errands, shopping? Y  Preparing Food and eating ? Y  Using the Toilet? Y  In the past six months, have you accidently leaked urine? Y  Do you have problems with loss of bowel control? Y  Managing your Medications? Y  Managing your Finances? Y  Housekeeping or managing your Housekeeping? Y  Some recent data might be hidden    Immunizations and Health Maintenance Immunization History  Administered Date(s) Administered  . Influenza Whole 09/02/2005, 09/05/2007, 09/28/2012  . PPD Test 10/15/2013  . Pneumococcal Polysaccharide-23 11/24/2003  . Td 09/29/2002   Health Maintenance Due  Topic Date Due  . PNA vac Low Risk Adult (2 of 2 - PCV13) 11/23/2004  . TETANUS/TDAP  09/29/2012    Patient Care Team: Oneal Grout, MD as PCP - General (Internal Medicine) Laurey Morale, MD as Consulting Physician (Cardiology) Mast, Man X, NP as Nurse Practitioner (Internal Medicine)  Indicate any recent Medical Services you may have received from other than [facility] providers in the past year (date may be approximate).     Assessment:   This is a routine wellness examination for Jamie [name].   Hearing/Vision screen No exam data present  Dietary issues and exercise activities discussed: Current Exercise Habits: The patient does not participate in regular exercise at present, Exercise limited by: orthopedic condition(s);neurologic condition(s)  Goals    None     Depression Screen PHQ 2/9 Scores 07/02/2017 08/19/2015 04/01/2015  PHQ - 2 Score 0 0 0    Fall Risk Fall Risk  07/02/2017 08/19/2015 04/01/2015 08/31/2013  Falls in the past year? No No No Yes  Number falls in past yr: - - - 2 or  more  Risk for fall due to : - - Impaired mobility;Impaired balance/gait Impaired balance/gait    Cognitive Function: MMSE - Mini Mental State Exam 10/18/2013  Orientation to time 2  Orientation to Place 3  Registration 3  Attention/ Calculation 4  Recall 0  Language- name 2 objects 2  Language- repeat 1  Language- follow 3 step command 3  Language- read & follow direction 1  Write a sentence 1  Copy design 0  Total score 20     6CIT Screen 07/02/2017  What Year? 4 points  What month? 3 points  What time? 3 points  Count back from 20 0 points  Months in reverse 4 points  Repeat phrase 10 points  Total Score 24    Screening Tests Health Maintenance  Topic Date Due  . PNA vac Low  Risk Adult (2 of 2 - PCV13) 11/23/2004  . TETANUS/TDAP  09/29/2012  . INFLUENZA VACCINE  09/22/2017 (Originally 06/23/2017)  . DEXA SCAN  Completed      Plan:    I have personally reviewed and addressed the Medicare Annual Wellness questionnaire and have noted the following in the patient's chart:  A. Medical and social history B. Use of alcohol, tobacco or illicit drugs  C. Current medications and supplements D. Functional ability and status E.  Nutritional status F.  Physical activity G. Advance directives H. List of other physicians I.  Hospitalizations, surgeries, and ER visits in previous 12 months J.  Vitals K. Screenings to include hearing, vision, cognitive, depression L. Referrals and appointments - none  In addition, I am unable to review and discuss with incapacitated patient certain preventive protocols, quality metrics, and best practice recommendations. A written personalized care plan for preventive services as well as general preventive health recommendations were provided to patient.  See attached scanned questionnaire for additional information.   Signed,   Annetta MawSara Gonthier, RN Nurse Health Advisor   Quick Notes   Health Maintenance: PNA 13 and tdap  due     Abnormal Screen: 6 CIT-24     Patient Concerns: None     Nurse Concerns: None

## 2017-07-02 NOTE — Assessment & Plan Note (Addendum)
On going since 2015 when her left breast mass was noted and progression of dementia, Continue supportive/comfort  measures. Hospice referral.

## 2017-07-02 NOTE — Assessment & Plan Note (Signed)
07/05/14 POA declined Mammogram. Desires no IVF. ABT for comfort measures only.

## 2017-07-02 NOTE — Assessment & Plan Note (Signed)
The patient hs chronic mid back pain, in her shoulder blades area, has been taking Norco bid and prn, will increase Norco 5/325mg  tid for better pain control.

## 2017-07-02 NOTE — Assessment & Plan Note (Signed)
ED evaluation 8/8//18 for chest pain, the patient presented to ED with complaining pain in the left arm radiating to chest x 2 hours. In ED 06/30/17, EKG showed Afib, vent rate 86, nonspecific T abnormalities. Her goal of care is near comfort measures, her son agreed with minimal workup in the ED. 07/01/17 CXR 1. Mild left basilar atelectasis noted. Peribronchial thickening seen. 2. Mild cardiomegaly. Aneurysmal dilatation of the ascending thoracic aorta, as previously noted. The patient has no c/o chest pain upon my visit. She denied palpitation, she is afebrile, no O2 desaturation.

## 2017-07-05 ENCOUNTER — Encounter: Payer: Self-pay | Admitting: Nurse Practitioner

## 2017-07-05 ENCOUNTER — Non-Acute Institutional Stay (SKILLED_NURSING_FACILITY): Payer: Medicare Other | Admitting: Nurse Practitioner

## 2017-07-05 DIAGNOSIS — I10 Essential (primary) hypertension: Secondary | ICD-10-CM | POA: Diagnosis not present

## 2017-07-05 DIAGNOSIS — M549 Dorsalgia, unspecified: Secondary | ICD-10-CM | POA: Diagnosis not present

## 2017-07-05 DIAGNOSIS — K5900 Constipation, unspecified: Secondary | ICD-10-CM

## 2017-07-05 DIAGNOSIS — F418 Other specified anxiety disorders: Secondary | ICD-10-CM

## 2017-07-05 DIAGNOSIS — I4891 Unspecified atrial fibrillation: Secondary | ICD-10-CM | POA: Diagnosis not present

## 2017-07-05 NOTE — Assessment & Plan Note (Signed)
EKG 06/21/17 showed Afib. Her heart rate is in control,  POA declined anticoagulation therapy, desires comfort measures. Continue Metoprolol 50mg  bid po

## 2017-07-05 NOTE — Assessment & Plan Note (Signed)
The patient has chronic mid back pain, in her shoulder blades area, better, continue Norco tid and prn

## 2017-07-05 NOTE — Progress Notes (Addendum)
Location:  [facility] Room Number: 22 Place of Service:  SNF (31) Provider:  Normand Damron, Manxie  NP  Oneal Grout, MD  Patient Care Team: Oneal Grout, MD as PCP - General (Internal Medicine) Laurey Morale, MD as Consulting Physician (Cardiology) Sumayya Muha X, NP as Nurse Practitioner (Internal Medicine)  Extended Emergency Contact Information Primary Emergency Contact: Schoenberger,Charles Address: [address]          [place], [place] [address] [place] Home Phone: [phone] Relation: Son Secondary Emergency Contact: Stuart,Cleleste Address: [address]          [place], [place] [address] [place] Home Phone: [phone] Mobile Phone: [phone] Relation: Daughter  Code Status:  DNR Goals of care: Advanced Directive information Advanced Directives 07/05/2017  Does Patient Have a Medical Advance Directive? Yes  Type of Advance Directive Out of facility DNR (pink MOST or yellow form)  Does patient want to make changes to medical advance directive? No - Patient declined  Copy of Healthcare Power of Attorney in Chart? -  Pre-existing out of facility DNR order (yellow form or pink MOST form) Yellow form placed in chart (order not valid for inpatient use)     Chief Complaint  Patient presents with  . Medical Management of Chronic Issues    HPI:  Pt is a 83 y.o. female seen today for medical management of chronic diseases.     Hx of mid back pain, chronic, managed with Norco tidand Tylenol q6h prn, HTN controlled, taking Metoprolol 50mg  bid,  Depression, mood is table, managed with Cymbalta 30mg , constipation controlled on MiraLax prn and Amitiza , dementia, last MMSE 21/30 09/25/15, taking Aricept and Namenda to preserve memory, she resides in SNF for care assistance  Past Medical History:  Diagnosis Date  . Abnormality of gait 09/29/2012  . Allergic rhinitis   . Anemia, unspecified 01/12/2013  . Aortic  insufficiency 04/16/2010   a. Echo 2011 - mild to moderate AI, EF 60%.  . Breast lump on left side at 7 o'clock position 07/02/2014   07/05/14 POA declined Mammogram. Desires no IVF. ABT for comfort measures only.    . Congestive heart failure, unspecified 06/30/2003  . Dementia   . Descending thoracic aortic dissection (HCC) 04/24/2010   a. MRA 2011 - begins distal to left subclavian and extends throughout the full length of the Ao.  Marland Kitchen External hemorrhoids without mention of complication 09/29/2012  . Failure to thrive in adult 04/01/2015  . GERD (gastroesophageal reflux disease)   . Hypertension   . Hyposmolality and/or hyponatremia 09/29/2012  . IBS (irritable bowel syndrome)   . Impacted cerumen 09/29/2012  . Lumbago 10/06/2012  . Osteoarthritis    hands  . Osteoporosis   . Other abnormal blood chemistry 01/12/2013  . Otitis externa 09/29/2012  . Palpitation    a. PAC's & PVC's by Holter - 2011  . Personal history of fall 10/06/2012  . Tortuous colon 2002  . Unspecified conductive hearing loss 09/29/2012  . Unspecified constipation 09/29/2012  . Unspecified venous (peripheral) insufficiency 09/29/2012   Past Surgical History:  Procedure Laterality Date  . CATARACT EXTRACTION W/ INTRAOCULAR LENS IMPLANT    . DILATION AND CURETTAGE OF UTERUS  1960    Allergies  Allergen Reactions  . Alendronate Sodium Other (See Comments)    Reaction:  GI upset   . Benzalkonium Chloride Other (See Comments)    "On MAR"  . Influenza Vaccines Other (See Comments)  Reaction:  Unknown   . Merthiolate [Thimerosal]     Outpatient Encounter Prescriptions as of 07/05/2017  Medication Sig  . acetaminophen (TYLENOL) 325 MG tablet Take 650 mg by mouth every 6 (six) hours as needed for mild pain, moderate pain or headache.   . DULoxetine (CYMBALTA) 30 MG capsule Take 30 mg by mouth daily.  . feeding supplement (BOOST / RESOURCE BREEZE) LIQD Take 1 Container by mouth 3 (three) times daily between meals.   Marland Kitchen  HYDROcodone-acetaminophen (NORCO/VICODIN) 5-325 MG tablet Take 1 tablet by mouth every 6 (six) hours as needed for moderate pain.  . Hypromellose (ARTIFICIAL TEARS OP) Apply 1 drop to eye 2 (two) times daily. Both eyes  . lubiprostone (AMITIZA) 24 MCG capsule Take 24 mcg by mouth daily.  . Memantine HCl-Donepezil HCl (NAMZARIC) 28-10 MG CP24 Take 1 capsule by mouth at bedtime.  . metoprolol (LOPRESSOR) 50 MG tablet Take 1 tablet (50 mg total) by mouth 2 (two) times daily.  . polyethylene glycol (MIRALAX / GLYCOLAX) packet Take 17 g by mouth daily as needed for mild constipation or moderate constipation.   . simethicone (MYLICON) 40 MG/0.6ML drops Take 0.6 mLs (40 mg total) by mouth 4 (four) times daily as needed for flatulence.  . [DISCONTINUED] HYDROcodone-acetaminophen (NORCO/VICODIN) 5-325 MG tablet Take 1 tablet by mouth 2 (two) times daily.    No facility-administered encounter medications on file as of 07/05/2017.    ROS was provided by assistance of staff Review of Systems  Constitutional: Negative for activity change, appetite change, chills, fatigue and fever.       ROS is provided by the patient with assistance of staff.   HENT: Positive for hearing loss. Negative for trouble swallowing and voice change.   Eyes: Negative for visual disturbance.  Respiratory: Negative for shortness of breath.   Cardiovascular: Negative for chest pain and palpitations.  Gastrointestinal: Negative for abdominal distention, abdominal pain, constipation, nausea and vomiting.       Denied heart burns or indigestion.   Genitourinary: Positive for frequency. Negative for difficulty urinating, dysuria, flank pain and hematuria.  Musculoskeletal: Positive for back pain. Negative for arthralgias.       Fall x2 prior to starting of antibiotic for UTI Chronic mid back pain in her shoulder blades area  Allergic/Immunologic: Negative.   Neurological: Negative for speech difficulty.       Dementia    Psychiatric/Behavioral: Positive for confusion. Negative for agitation, behavioral problems and hallucinations. The patient is not nervous/anxious.        09/25/15 MMSE 21/30     Immunization History  Administered Date(s) Administered  . Influenza Whole 09/02/2005, 09/05/2007, 09/28/2012  . PPD Test 10/15/2013  . Pneumococcal Polysaccharide-23 11/24/2003  . Td 09/29/2002   Pertinent  Health Maintenance Due  Topic Date Due  . PNA vac Low Risk Adult (2 of 2 - PCV13) 11/23/2004  . INFLUENZA VACCINE  09/22/2017 (Originally 06/23/2017)  . DEXA SCAN  Completed   Fall Risk  07/02/2017 08/19/2015 04/01/2015 08/31/2013  Falls in the past year? No No No Yes  Number falls in past yr: - - - 2 or more  Risk for fall due to : - - Impaired mobility;Impaired balance/gait Impaired balance/gait   Functional Status Survey:    Vitals:   07/05/17 1320  BP: 110/60  Pulse: 65  Resp: 20  Temp: 98.9 F (37.2 C)  SpO2: 94%  Weight: 98 lb 8 oz (44.7 kg)  Height: 5\' 4"  (1.626 m)   Body  mass index is 16.91 kg/m. Physical Exam  Constitutional: No distress.  Thin, frail.   HENT:  Head: Normocephalic and atraumatic.  Eyes: Pupils are equal, round, and reactive to light. Conjunctivae are normal.  Neck: Normal range of motion. Neck supple.  Cardiovascular: Normal rate.   Murmur heard. HR 90s, irregular heart beats  Pulmonary/Chest: Effort normal and breath sounds normal. She has no wheezes. She has no rales.  Has left breast mass, non tender  Abdominal: Soft. Bowel sounds are normal. She exhibits no distension. There is no tenderness.  Musculoskeletal: Normal range of motion. She exhibits no edema or tenderness.  Pain palpated in her middle back.   Neurological: She is alert. No cranial nerve deficit. Coordination normal.  Alert, oriented only to self  Skin: Skin is warm and dry. She is not diaphoretic.  Left breast mass, a large marble size, mobile, tender, hard felt  like forehead  Psychiatric:  She has a normal mood and affect. Her behavior is normal.  Confusion.    Labs reviewed:  Recent Labs  01/12/17 04/06/17 07/01/17 0025  NA 137 137 135  K 4.1 4.1 4.7  CL  --   --  98*  CO2  --   --  29  GLUCOSE  --   --  100*  BUN 22* 23* 24*  CREATININE 0.7 0.5 0.57  CALCIUM  --   --  9.3    Recent Labs  01/12/17 04/06/17  AST 11* 11*  ALT 8 6*  ALKPHOS 59 95    Recent Labs  04/06/17 06/22/17 07/01/17 0025  WBC 5.6 6.1 5.9  HGB 10.5* 12.1 11.9*  HCT 32* 37 36.9  MCV  --   --  90.0  PLT 181 145* 155   Lab Results  Component Value Date   TSH 1.00 06/23/2016   Lab Results  Component Value Date   HGBA1C 5.9 01/17/2013   Lab Results  Component Value Date   CHOL 181 01/07/2010   HDL 93.60 01/07/2010   LDLCALC 78 01/07/2010   TRIG 49.0 01/07/2010   CHOLHDL 2 01/07/2010    Significant Diagnostic Results in last 30 days:  Dg Chest 2 View  Result Date: 07/01/2017 CLINICAL DATA:  Acute onset of left upper chest pain. Initial encounter. EXAM: CHEST  2 VIEW COMPARISON:  Chest radiograph performed 06/28/2016 FINDINGS: The lungs are well-aerated. Mild left basilar atelectasis is noted. Peribronchial thickening is seen. There is no evidence of pleural effusion or pneumothorax. The heart is mildly enlarged. There is aneurysmal dilatation of the ascending thoracic aorta. No acute osseous abnormalities are seen. IMPRESSION: 1. Mild left basilar atelectasis noted. Peribronchial thickening seen. 2. Mild cardiomegaly. Aneurysmal dilatation of the ascending thoracic aorta, as previously noted. Electronically Signed   By: Roanna RaiderJeffery  Chang M.D.   On: 07/01/2017 00:43    Assessment/Plan Hypertension Controlled, continue Metoprolol 50mg  bid  A-fib (HCC) EKG 06/21/17 showed Afib. Her heart rate is in control,  POA declined anticoagulation therapy, desires comfort measures. Continue Metoprolol 50mg  bid po  Constipation Stable. Continue MiraLax prn and Amitiza 24mcg po  daily  Dementia, vascular Baseline confusion, resolved hallucination after UTI fully treated. Continue Namenda and Aricept.   Mid back pain The patient has chronic mid back pain, in her shoulder blades area, better, continue Norco tid and prn   Depression with anxiety mood is table, GDR per pharm recommendation, decrease Cymbalta 20/30mg , observe for return of targeted symptoms.      Family/ staff Communication: plan of  care reviewed with the patient and charge nurse.   Labs/tests ordered:  none  Time spend 25 minutes.

## 2017-07-05 NOTE — Assessment & Plan Note (Signed)
Controlled, continue  Metoprolol 50mg bid.  

## 2017-07-05 NOTE — Assessment & Plan Note (Addendum)
mood is table, GDR per pharm recommendation, decrease Cymbalta 20/30mg , observe for return of targeted symptoms.

## 2017-07-05 NOTE — Assessment & Plan Note (Addendum)
Baseline confusion, resolved hallucination after UTI fully treated. Continue Namenda and Aricept.

## 2017-07-05 NOTE — Assessment & Plan Note (Signed)
Stable. Continue MiraLax prn and Amitiza po daily

## 2017-07-15 DIAGNOSIS — F0151 Vascular dementia with behavioral disturbance: Secondary | ICD-10-CM | POA: Diagnosis not present

## 2017-07-15 DIAGNOSIS — F339 Major depressive disorder, recurrent, unspecified: Secondary | ICD-10-CM | POA: Diagnosis not present

## 2017-07-15 DIAGNOSIS — I359 Nonrheumatic aortic valve disorder, unspecified: Secondary | ICD-10-CM | POA: Diagnosis not present

## 2017-07-15 DIAGNOSIS — I10 Essential (primary) hypertension: Secondary | ICD-10-CM | POA: Diagnosis not present

## 2017-07-15 DIAGNOSIS — I4891 Unspecified atrial fibrillation: Secondary | ICD-10-CM | POA: Diagnosis not present

## 2017-07-15 DIAGNOSIS — I714 Abdominal aortic aneurysm, without rupture: Secondary | ICD-10-CM | POA: Diagnosis not present

## 2017-07-15 DIAGNOSIS — I672 Cerebral atherosclerosis: Secondary | ICD-10-CM | POA: Diagnosis not present

## 2017-07-15 DIAGNOSIS — I509 Heart failure, unspecified: Secondary | ICD-10-CM | POA: Diagnosis not present

## 2017-07-15 DIAGNOSIS — K589 Irritable bowel syndrome without diarrhea: Secondary | ICD-10-CM | POA: Diagnosis not present

## 2017-07-15 DIAGNOSIS — I739 Peripheral vascular disease, unspecified: Secondary | ICD-10-CM | POA: Diagnosis not present

## 2017-07-16 DIAGNOSIS — I359 Nonrheumatic aortic valve disorder, unspecified: Secondary | ICD-10-CM | POA: Diagnosis not present

## 2017-07-16 DIAGNOSIS — I739 Peripheral vascular disease, unspecified: Secondary | ICD-10-CM | POA: Diagnosis not present

## 2017-07-16 DIAGNOSIS — I714 Abdominal aortic aneurysm, without rupture: Secondary | ICD-10-CM | POA: Diagnosis not present

## 2017-07-16 DIAGNOSIS — I509 Heart failure, unspecified: Secondary | ICD-10-CM | POA: Diagnosis not present

## 2017-07-16 DIAGNOSIS — F0151 Vascular dementia with behavioral disturbance: Secondary | ICD-10-CM | POA: Diagnosis not present

## 2017-07-16 DIAGNOSIS — I672 Cerebral atherosclerosis: Secondary | ICD-10-CM | POA: Diagnosis not present

## 2017-07-20 DIAGNOSIS — I739 Peripheral vascular disease, unspecified: Secondary | ICD-10-CM | POA: Diagnosis not present

## 2017-07-20 DIAGNOSIS — I359 Nonrheumatic aortic valve disorder, unspecified: Secondary | ICD-10-CM | POA: Diagnosis not present

## 2017-07-20 DIAGNOSIS — I672 Cerebral atherosclerosis: Secondary | ICD-10-CM | POA: Diagnosis not present

## 2017-07-20 DIAGNOSIS — I714 Abdominal aortic aneurysm, without rupture: Secondary | ICD-10-CM | POA: Diagnosis not present

## 2017-07-20 DIAGNOSIS — F0151 Vascular dementia with behavioral disturbance: Secondary | ICD-10-CM | POA: Diagnosis not present

## 2017-07-20 DIAGNOSIS — I509 Heart failure, unspecified: Secondary | ICD-10-CM | POA: Diagnosis not present

## 2017-07-24 DIAGNOSIS — I739 Peripheral vascular disease, unspecified: Secondary | ICD-10-CM | POA: Diagnosis not present

## 2017-07-24 DIAGNOSIS — I509 Heart failure, unspecified: Secondary | ICD-10-CM | POA: Diagnosis not present

## 2017-07-24 DIAGNOSIS — I714 Abdominal aortic aneurysm, without rupture: Secondary | ICD-10-CM | POA: Diagnosis not present

## 2017-07-24 DIAGNOSIS — I359 Nonrheumatic aortic valve disorder, unspecified: Secondary | ICD-10-CM | POA: Diagnosis not present

## 2017-07-24 DIAGNOSIS — F0151 Vascular dementia with behavioral disturbance: Secondary | ICD-10-CM | POA: Diagnosis not present

## 2017-07-24 DIAGNOSIS — K589 Irritable bowel syndrome without diarrhea: Secondary | ICD-10-CM | POA: Diagnosis not present

## 2017-07-24 DIAGNOSIS — I10 Essential (primary) hypertension: Secondary | ICD-10-CM | POA: Diagnosis not present

## 2017-07-24 DIAGNOSIS — F339 Major depressive disorder, recurrent, unspecified: Secondary | ICD-10-CM | POA: Diagnosis not present

## 2017-07-24 DIAGNOSIS — I4891 Unspecified atrial fibrillation: Secondary | ICD-10-CM | POA: Diagnosis not present

## 2017-07-24 DIAGNOSIS — I672 Cerebral atherosclerosis: Secondary | ICD-10-CM | POA: Diagnosis not present

## 2017-07-29 DIAGNOSIS — I672 Cerebral atherosclerosis: Secondary | ICD-10-CM | POA: Diagnosis not present

## 2017-07-29 DIAGNOSIS — I509 Heart failure, unspecified: Secondary | ICD-10-CM | POA: Diagnosis not present

## 2017-07-29 DIAGNOSIS — F0151 Vascular dementia with behavioral disturbance: Secondary | ICD-10-CM | POA: Diagnosis not present

## 2017-07-29 DIAGNOSIS — I359 Nonrheumatic aortic valve disorder, unspecified: Secondary | ICD-10-CM | POA: Diagnosis not present

## 2017-07-29 DIAGNOSIS — I714 Abdominal aortic aneurysm, without rupture: Secondary | ICD-10-CM | POA: Diagnosis not present

## 2017-07-29 DIAGNOSIS — I739 Peripheral vascular disease, unspecified: Secondary | ICD-10-CM | POA: Diagnosis not present

## 2017-08-03 DIAGNOSIS — I714 Abdominal aortic aneurysm, without rupture: Secondary | ICD-10-CM | POA: Diagnosis not present

## 2017-08-03 DIAGNOSIS — I672 Cerebral atherosclerosis: Secondary | ICD-10-CM | POA: Diagnosis not present

## 2017-08-03 DIAGNOSIS — F0151 Vascular dementia with behavioral disturbance: Secondary | ICD-10-CM | POA: Diagnosis not present

## 2017-08-03 DIAGNOSIS — I509 Heart failure, unspecified: Secondary | ICD-10-CM | POA: Diagnosis not present

## 2017-08-03 DIAGNOSIS — I359 Nonrheumatic aortic valve disorder, unspecified: Secondary | ICD-10-CM | POA: Diagnosis not present

## 2017-08-03 DIAGNOSIS — I739 Peripheral vascular disease, unspecified: Secondary | ICD-10-CM | POA: Diagnosis not present

## 2017-08-05 ENCOUNTER — Non-Acute Institutional Stay (SKILLED_NURSING_FACILITY): Payer: Medicare Other | Admitting: Internal Medicine

## 2017-08-05 ENCOUNTER — Encounter: Payer: Self-pay | Admitting: Internal Medicine

## 2017-08-05 DIAGNOSIS — E43 Unspecified severe protein-calorie malnutrition: Secondary | ICD-10-CM

## 2017-08-05 DIAGNOSIS — F03C0 Unspecified dementia, severe, without behavioral disturbance, psychotic disturbance, mood disturbance, and anxiety: Secondary | ICD-10-CM | POA: Insufficient documentation

## 2017-08-05 DIAGNOSIS — K5909 Other constipation: Secondary | ICD-10-CM | POA: Diagnosis not present

## 2017-08-05 DIAGNOSIS — G894 Chronic pain syndrome: Secondary | ICD-10-CM

## 2017-08-05 DIAGNOSIS — I482 Chronic atrial fibrillation, unspecified: Secondary | ICD-10-CM

## 2017-08-05 DIAGNOSIS — F0390 Unspecified dementia without behavioral disturbance: Secondary | ICD-10-CM

## 2017-08-05 NOTE — Progress Notes (Signed)
Location:  [facility] Room Number: 22 Place of Service:  SNF [phone]) Provider:  Oneal Grout MD  Oneal Grout, MD  Patient Care Team: Oneal Grout, MD as PCP - General (Internal Medicine) Laurey Morale, MD as Consulting Physician (Cardiology) Mast, Man X, NP as Nurse Practitioner (Internal Medicine)  Extended Emergency Contact Information Primary Emergency Contact: Gentle,Charles Address: [address]          [place], [place] [address] D[place] of [place] Home Phone: [phone] Relation: Son Secondary Emergency Contact: Stuart,Cleleste Address: [address]          [place], [place] [address] [place] of [place] Home Phone: [phone] Mobile Phone: [phone] Relation: Daughter  Code Status:  DNR Goals of care: Advanced Directive information Advanced Directives 08/05/2017  Does Patient Have a Medical Advance Directive? Yes  Type of Advance Directive Out of facility DNR (pink MOST or yellow form)  Does patient want to make changes to medical advance directive? No - Patient declined  Copy of Healthcare Power of Attorney in Chart? -  Pre-existing out of facility DNR order (yellow form or pink MOST form) Yellow form placed in chart (order not valid for inpatient use)     Chief Complaint  Patient presents with  . Medical Management of Chronic Issues    Routine Visit. Ongoing pain.     HPI:  Pt is a 83 y.o. female seen today for medical management of chronic diseases. She has been receiving care from hospice services from 07/16/17. She is seen in her room today. She appears comfortable. She complaints of generalized aches. She has chronic pain and is on pain medication. She is unable to specify if she hurts more at one area. She goes to the dining area for meals. She is very hard of hearing and has cognitive impairment and this limits her HPI and ROS. She feeds herself and requires assistance at times. No fall has been reported. No  pressure ulcer or wound reported. She needs assistance with transfers and has been out of bed daily.    Past Medical History:  Diagnosis Date  . Abnormality of gait 09/29/2012  . Allergic rhinitis   . Anemia, unspecified 01/12/2013  . Aortic insufficiency 04/16/2010   a. Echo 2011 - mild to moderate AI, EF 60%.  . Breast lump on left side at 7 o'clock position 07/02/2014   07/05/14 POA declined Mammogram. Desires no IVF. ABT for comfort measures only.    . Congestive heart failure, unspecified 06/30/2003  . Dementia   . Descending thoracic aortic dissection (HCC) 04/24/2010   a. MRA 2011 - begins distal to left subclavian and extends throughout the full length of the Ao.  Marland Kitchen External hemorrhoids without mention of complication 09/29/2012  . Failure to thrive in adult 04/01/2015  . GERD (gastroesophageal reflux disease)   . Hypertension   . Hyposmolality and/or hyponatremia 09/29/2012  . IBS (irritable bowel syndrome)   . Impacted cerumen 09/29/2012  . Lumbago 10/06/2012  . Osteoarthritis    hands  . Osteoporosis   . Other abnormal blood chemistry 01/12/2013  . Otitis externa 09/29/2012  . Palpitation    a. PAC's & PVC's by Holter - 2011  . Personal history of fall 10/06/2012  . Tortuous colon 2002  . Unspecified conductive hearing loss 09/29/2012  . Unspecified constipation 09/29/2012  . Unspecified venous (peripheral) insufficiency 09/29/2012   Past Surgical History:  Procedure Laterality Date  . CATARACT EXTRACTION W/ INTRAOCULAR LENS IMPLANT    .  DILATION AND CURETTAGE OF UTERUS  1960    Allergies  Allergen Reactions  . Alendronate Sodium Other (See Comments)    Reaction:  GI upset   . Benzalkonium Chloride Other (See Comments)    "On MAR"  . Influenza Vaccines Other (See Comments)    Reaction:  Unknown   . Merthiolate [Thimerosal]     Outpatient Encounter Prescriptions as of 08/05/2017  Medication Sig  . acetaminophen (TYLENOL) 325 MG tablet Take 650 mg by mouth every 6 (six)  hours as needed for mild pain, moderate pain or headache.   . DULoxetine (CYMBALTA) 20 MG capsule Take 20 mg by mouth daily.  . feeding supplement (BOOST / RESOURCE BREEZE) LIQD Take 1 Container by mouth 3 (three) times daily between meals.   Marland Kitchen. HYDROcodone-acetaminophen (NORCO/VICODIN) 5-325 MG tablet Take 1 tablet by mouth 3 (three) times daily. Can also take as needed in addition to the schedule dose.  Marland Kitchen. Hypromellose (ARTIFICIAL TEARS OP) Apply 1 drop to eye 2 (two) times daily. Both eyes  . lubiprostone (AMITIZA) 24 MCG capsule Take 24 mcg by mouth daily.  . Memantine HCl-Donepezil HCl (NAMZARIC) 28-10 MG CP24 Take 1 capsule by mouth at bedtime.  . metoprolol (LOPRESSOR) 50 MG tablet Take 1 tablet (50 mg total) by mouth 2 (two) times daily.  . polyethylene glycol (MIRALAX / GLYCOLAX) packet Take 17 g by mouth daily as needed for mild constipation or moderate constipation.   . simethicone (MYLICON) 40 MG/0.6ML drops Take 0.6 mLs (40 mg total) by mouth 4 (four) times daily as needed for flatulence.  . [DISCONTINUED] DULoxetine (CYMBALTA) 30 MG capsule Take 30 mg by mouth daily.   No facility-administered encounter medications on file as of 08/05/2017.     Review of Systems  Unable to perform ROS: Dementia    Immunization History  Administered Date(s) Administered  . Influenza Whole 09/02/2005, 09/05/2007, 09/28/2012  . PPD Test 10/15/2013  . Pneumococcal Polysaccharide-23 11/24/2003  . Td 09/29/2002   Pertinent  Health Maintenance Due  Topic Date Due  . INFLUENZA VACCINE  09/22/2017 (Originally 06/23/2017)  . DEXA SCAN  Completed  . PNA vac Low Risk Adult  Completed   Fall Risk  07/02/2017 08/19/2015 04/01/2015 08/31/2013  Falls in the past year? No No No Yes  Number falls in past yr: - - - 2 or more  Risk for fall due to : - - Impaired mobility;Impaired balance/gait Impaired balance/gait   Functional Status Survey:    Vitals:   08/05/17 1213  BP: 112/70  Pulse: 82  Resp: 18    Temp: 98.2 F (36.8 C)  TempSrc: Oral  SpO2: 95%  Weight: 97 lb 4.8 oz (44.1 kg)  Height: 5\' 4"  (1.626 m)   Body mass index is 16.7 kg/m.   Wt Readings from Last 3 Encounters:  08/05/17 97 lb 4.8 oz (44.1 kg)  07/05/17 98 lb 8 oz (44.7 kg)  07/02/17 93 lb (42.2 kg)   Physical Exam  Constitutional: No distress.  Elderly female, thin built, frail  HENT:  Head: Normocephalic and atraumatic.  Mouth/Throat: Oropharynx is clear and moist.  Very hard of hearing, wet quality voice  Eyes: Pupils are equal, round, and reactive to light. Conjunctivae are normal. Right eye exhibits no discharge. Left eye exhibits no discharge.  Neck: Normal range of motion. Neck supple.  Cardiovascular:  Murmur heard. Irregular heart rate  Pulmonary/Chest: Effort normal and breath sounds normal. She has no wheezes. She has no rales.  Has left  breast mass, non tender, poor air entry to lung bases  Abdominal: Soft. Bowel sounds are normal. She exhibits distension. There is no tenderness. There is no guarding.  Musculoskeletal: She exhibits edema.  Can move all 4 extremities, has to be wheeled around on wheelchair, has arthritis changes  Lymphadenopathy:    She has no cervical adenopathy.  Neurological:  Alert, oriented only to self, follows some basic command  Skin: Skin is warm and dry. She is not diaphoretic.  Psychiatric:  Pleasantly confused    Labs reviewed:  Recent Labs  04/06/17 06/21/17 07/01/17 0025  NA 137 140 135  K 4.1 3.8 4.7  CL  --   --  98*  CO2  --   --  29  GLUCOSE  --   --  100*  BUN 23* 23* 24*  CREATININE 0.5 0.6 0.57  CALCIUM  --   --  9.3    Recent Labs  01/12/17 04/06/17 06/21/17  AST 11* 11* 12*  ALT 8 6* 8  ALKPHOS 59 95 65    Recent Labs  06/21/17 06/22/17 07/01/17 0025  WBC 6.1 6.1 5.9  HGB 12.1 12.1 11.9*  HCT 37 37 36.9  MCV  --   --  90.0  PLT 145* 145* 155   Lab Results  Component Value Date   TSH 1.00 06/23/2016   Lab Results  Component  Value Date   HGBA1C 5.9 01/17/2013   Lab Results  Component Value Date   CHOL 181 01/07/2010   HDL 93.60 01/07/2010   LDLCALC 78 01/07/2010   TRIG 49.0 01/07/2010   CHOLHDL 2 01/07/2010   No results found for: FERRITIN   05/27/17 Ferritin 178  Significant Diagnostic Results in last 30 days:  No results found.  Assessment/Plan  Chronic pain Complaints of pain but does not appear in distress. Currently on norco 5-325 mg tid with tylenol 650 mg q6h prn and cymbalta. Patient unable to specify where she is hurting and clinically she appears stable. Monitor and provide comfort measures. Hospice team is following on her care as well. Will not make any changes to her pain regimen at present  Chronic constipation With limited movement and being on narcotics. On amitiza daily with prn miralax, encourage hydration.   Severe protein calorie malnutrition Low weight, poor po intake, continue feeding supplement as tolerated, assist with feeding as needed. Decline anticipated.   Advanced dementia Decline anticipated. Continue namzaric for now, comfort measures and supportive care  afib Controlled heart rate. Stable BP reading on chart review. Continue metoprolol 50 mg bid for rate control and monitor. Not on anticoagulation with her age and fall risk.     Family/ staff Communication: reviewed care plan with patient and her charge nurse.    Labs/tests ordered:  none   Oneal Grout, MD Internal Medicine [facility] [address] [place], [place] [address] Cell Phone (Monday-Friday 8 am - 5 pm): [phone] On Call: [phone] and follow prompts after 5 pm and on weekends Office Phone: [phone] Office Fax: [phone]

## 2017-08-09 DIAGNOSIS — I714 Abdominal aortic aneurysm, without rupture: Secondary | ICD-10-CM | POA: Diagnosis not present

## 2017-08-09 DIAGNOSIS — I739 Peripheral vascular disease, unspecified: Secondary | ICD-10-CM | POA: Diagnosis not present

## 2017-08-09 DIAGNOSIS — I509 Heart failure, unspecified: Secondary | ICD-10-CM | POA: Diagnosis not present

## 2017-08-09 DIAGNOSIS — F0151 Vascular dementia with behavioral disturbance: Secondary | ICD-10-CM | POA: Diagnosis not present

## 2017-08-09 DIAGNOSIS — I672 Cerebral atherosclerosis: Secondary | ICD-10-CM | POA: Diagnosis not present

## 2017-08-09 DIAGNOSIS — I359 Nonrheumatic aortic valve disorder, unspecified: Secondary | ICD-10-CM | POA: Diagnosis not present

## 2017-08-10 DIAGNOSIS — I739 Peripheral vascular disease, unspecified: Secondary | ICD-10-CM | POA: Diagnosis not present

## 2017-08-10 DIAGNOSIS — I672 Cerebral atherosclerosis: Secondary | ICD-10-CM | POA: Diagnosis not present

## 2017-08-10 DIAGNOSIS — I714 Abdominal aortic aneurysm, without rupture: Secondary | ICD-10-CM | POA: Diagnosis not present

## 2017-08-10 DIAGNOSIS — I359 Nonrheumatic aortic valve disorder, unspecified: Secondary | ICD-10-CM | POA: Diagnosis not present

## 2017-08-10 DIAGNOSIS — F0151 Vascular dementia with behavioral disturbance: Secondary | ICD-10-CM | POA: Diagnosis not present

## 2017-08-10 DIAGNOSIS — I509 Heart failure, unspecified: Secondary | ICD-10-CM | POA: Diagnosis not present

## 2017-08-13 DIAGNOSIS — I714 Abdominal aortic aneurysm, without rupture: Secondary | ICD-10-CM | POA: Diagnosis not present

## 2017-08-13 DIAGNOSIS — I509 Heart failure, unspecified: Secondary | ICD-10-CM | POA: Diagnosis not present

## 2017-08-13 DIAGNOSIS — I672 Cerebral atherosclerosis: Secondary | ICD-10-CM | POA: Diagnosis not present

## 2017-08-13 DIAGNOSIS — I739 Peripheral vascular disease, unspecified: Secondary | ICD-10-CM | POA: Diagnosis not present

## 2017-08-13 DIAGNOSIS — F0151 Vascular dementia with behavioral disturbance: Secondary | ICD-10-CM | POA: Diagnosis not present

## 2017-08-13 DIAGNOSIS — I359 Nonrheumatic aortic valve disorder, unspecified: Secondary | ICD-10-CM | POA: Diagnosis not present

## 2017-08-16 DIAGNOSIS — I672 Cerebral atherosclerosis: Secondary | ICD-10-CM | POA: Diagnosis not present

## 2017-08-16 DIAGNOSIS — F0151 Vascular dementia with behavioral disturbance: Secondary | ICD-10-CM | POA: Diagnosis not present

## 2017-08-16 DIAGNOSIS — I739 Peripheral vascular disease, unspecified: Secondary | ICD-10-CM | POA: Diagnosis not present

## 2017-08-16 DIAGNOSIS — I714 Abdominal aortic aneurysm, without rupture: Secondary | ICD-10-CM | POA: Diagnosis not present

## 2017-08-16 DIAGNOSIS — I359 Nonrheumatic aortic valve disorder, unspecified: Secondary | ICD-10-CM | POA: Diagnosis not present

## 2017-08-16 DIAGNOSIS — I509 Heart failure, unspecified: Secondary | ICD-10-CM | POA: Diagnosis not present

## 2017-08-23 DIAGNOSIS — I509 Heart failure, unspecified: Secondary | ICD-10-CM | POA: Diagnosis not present

## 2017-08-23 DIAGNOSIS — K589 Irritable bowel syndrome without diarrhea: Secondary | ICD-10-CM | POA: Diagnosis not present

## 2017-08-23 DIAGNOSIS — I4891 Unspecified atrial fibrillation: Secondary | ICD-10-CM | POA: Diagnosis not present

## 2017-08-23 DIAGNOSIS — F339 Major depressive disorder, recurrent, unspecified: Secondary | ICD-10-CM | POA: Diagnosis not present

## 2017-08-23 DIAGNOSIS — F0151 Vascular dementia with behavioral disturbance: Secondary | ICD-10-CM | POA: Diagnosis not present

## 2017-08-23 DIAGNOSIS — I672 Cerebral atherosclerosis: Secondary | ICD-10-CM | POA: Diagnosis not present

## 2017-08-23 DIAGNOSIS — I739 Peripheral vascular disease, unspecified: Secondary | ICD-10-CM | POA: Diagnosis not present

## 2017-08-23 DIAGNOSIS — I10 Essential (primary) hypertension: Secondary | ICD-10-CM | POA: Diagnosis not present

## 2017-08-23 DIAGNOSIS — I359 Nonrheumatic aortic valve disorder, unspecified: Secondary | ICD-10-CM | POA: Diagnosis not present

## 2017-08-23 DIAGNOSIS — I714 Abdominal aortic aneurysm, without rupture: Secondary | ICD-10-CM | POA: Diagnosis not present

## 2017-08-24 DIAGNOSIS — I739 Peripheral vascular disease, unspecified: Secondary | ICD-10-CM | POA: Diagnosis not present

## 2017-08-24 DIAGNOSIS — I672 Cerebral atherosclerosis: Secondary | ICD-10-CM | POA: Diagnosis not present

## 2017-08-24 DIAGNOSIS — F0151 Vascular dementia with behavioral disturbance: Secondary | ICD-10-CM | POA: Diagnosis not present

## 2017-08-24 DIAGNOSIS — I359 Nonrheumatic aortic valve disorder, unspecified: Secondary | ICD-10-CM | POA: Diagnosis not present

## 2017-08-24 DIAGNOSIS — I509 Heart failure, unspecified: Secondary | ICD-10-CM | POA: Diagnosis not present

## 2017-08-24 DIAGNOSIS — I714 Abdominal aortic aneurysm, without rupture: Secondary | ICD-10-CM | POA: Diagnosis not present

## 2017-08-30 DIAGNOSIS — I714 Abdominal aortic aneurysm, without rupture: Secondary | ICD-10-CM | POA: Diagnosis not present

## 2017-08-30 DIAGNOSIS — I509 Heart failure, unspecified: Secondary | ICD-10-CM | POA: Diagnosis not present

## 2017-08-30 DIAGNOSIS — F0151 Vascular dementia with behavioral disturbance: Secondary | ICD-10-CM | POA: Diagnosis not present

## 2017-08-30 DIAGNOSIS — I359 Nonrheumatic aortic valve disorder, unspecified: Secondary | ICD-10-CM | POA: Diagnosis not present

## 2017-08-30 DIAGNOSIS — I672 Cerebral atherosclerosis: Secondary | ICD-10-CM | POA: Diagnosis not present

## 2017-08-30 DIAGNOSIS — I739 Peripheral vascular disease, unspecified: Secondary | ICD-10-CM | POA: Diagnosis not present

## 2017-09-06 ENCOUNTER — Encounter: Payer: Self-pay | Admitting: Nurse Practitioner

## 2017-09-06 ENCOUNTER — Non-Acute Institutional Stay (SKILLED_NURSING_FACILITY): Payer: Medicare Other | Admitting: Nurse Practitioner

## 2017-09-06 DIAGNOSIS — I482 Chronic atrial fibrillation, unspecified: Secondary | ICD-10-CM

## 2017-09-06 DIAGNOSIS — I672 Cerebral atherosclerosis: Secondary | ICD-10-CM | POA: Diagnosis not present

## 2017-09-06 DIAGNOSIS — F0150 Vascular dementia without behavioral disturbance: Secondary | ICD-10-CM

## 2017-09-06 DIAGNOSIS — I714 Abdominal aortic aneurysm, without rupture: Secondary | ICD-10-CM | POA: Diagnosis not present

## 2017-09-06 DIAGNOSIS — F418 Other specified anxiety disorders: Secondary | ICD-10-CM | POA: Diagnosis not present

## 2017-09-06 DIAGNOSIS — K5909 Other constipation: Secondary | ICD-10-CM

## 2017-09-06 DIAGNOSIS — M549 Dorsalgia, unspecified: Secondary | ICD-10-CM

## 2017-09-06 DIAGNOSIS — I359 Nonrheumatic aortic valve disorder, unspecified: Secondary | ICD-10-CM | POA: Diagnosis not present

## 2017-09-06 DIAGNOSIS — I509 Heart failure, unspecified: Secondary | ICD-10-CM | POA: Diagnosis not present

## 2017-09-06 DIAGNOSIS — I739 Peripheral vascular disease, unspecified: Secondary | ICD-10-CM | POA: Diagnosis not present

## 2017-09-06 DIAGNOSIS — F0151 Vascular dementia with behavioral disturbance: Secondary | ICD-10-CM | POA: Diagnosis not present

## 2017-09-06 NOTE — Assessment & Plan Note (Signed)
Back pain is managed with Norco tid.

## 2017-09-06 NOTE — Assessment & Plan Note (Signed)
Constipation is managed with Simethicone, Polyethylene, Lubibprostone.

## 2017-09-06 NOTE — Assessment & Plan Note (Signed)
Afib, heart rate is in control, taking Metoprolol  bid, POA delined anticoagulation therapy, desires comfort measures, currently she is under Hospice service.

## 2017-09-06 NOTE — Assessment & Plan Note (Signed)
Her mood is stable on Cymbalta 

## 2017-09-06 NOTE — Progress Notes (Signed)
Location:  [facility] Room Number: 22 Place of Service:  SNF (31) Provider:  Mast, Manxie  NP  Oneal Grout, MD  Patient Care Team: Oneal Grout, MD as PCP - General (Internal Medicine) Laurey Morale, MD as Consulting Physician (Cardiology) Mast, Man X, NP as Nurse Practitioner (Internal Medicine)  Extended Emergency Contact Information Primary Emergency Contact: Espericueta,Charles Address: [address]          [place], [place] [address] [place] of [place] Home Phone: [phone] Relation: Son Secondary Emergency Contact: Stuart,Cleleste Address: [address]          [place], [place] [address] [place] of [place] Home Phone: [phone] Mobile Phone: [phone] Relation: Daughter  Code Status:  DNR Goals of care: Advanced Directive information Advanced Directives 09/06/2017  Does Patient Have a Medical Advance Directive? Yes  Type of Advance Directive Out of facility DNR (pink MOST or yellow form)  Does patient want to make changes to medical advance directive? No - Patient declined  Copy of Healthcare Power of Attorney in Chart? -  Pre-existing out of facility DNR order (yellow form or pink MOST form) Yellow form placed in chart (order not valid for inpatient use)     Chief Complaint  Patient presents with  . Medical Management of Chronic Issues    HPI:  Pt is a 83 y.o. female seen today for medical management of chronic diseases.     The patient has history of Afib, heart rate is in control, taking Metoprolol  bid, her memory is preserved on Memantine/Aricept. Back pain is managed with Norco tid. Constipation is managed with Simethicone, Polyethylene, Lubibprostone. Her mood is stable on Cymbalta    Past Medical History:  Diagnosis Date  . Abnormality of gait 09/29/2012  . Allergic rhinitis   . Anemia, unspecified 01/12/2013  . Aortic insufficiency 04/16/2010   a. Echo 2011 - mild to moderate AI, EF 60%.  .  Breast lump on left side at 7 o'clock position 07/02/2014   07/05/14 POA declined Mammogram. Desires no IVF. ABT for comfort measures only.    . Congestive heart failure, unspecified 06/30/2003  . Dementia   . Descending thoracic aortic dissection (HCC) 04/24/2010   a. MRA 2011 - begins distal to left subclavian and extends throughout the full length of the Ao.  Marland Kitchen External hemorrhoids without mention of complication 09/29/2012  . Failure to thrive in adult 04/01/2015  . GERD (gastroesophageal reflux disease)   . Hypertension   . Hyposmolality and/or hyponatremia 09/29/2012  . IBS (irritable bowel syndrome)   . Impacted cerumen 09/29/2012  . Lumbago 10/06/2012  . Osteoarthritis    hands  . Osteoporosis   . Other abnormal blood chemistry 01/12/2013  . Otitis externa 09/29/2012  . Palpitation    a. PAC's & PVC's by Holter - 2011  . Personal history of fall 10/06/2012  . Tortuous colon 2002  . Unspecified conductive hearing loss 09/29/2012  . Unspecified constipation 09/29/2012  . Unspecified venous (peripheral) insufficiency 09/29/2012   Past Surgical History:  Procedure Laterality Date  . CATARACT EXTRACTION W/ INTRAOCULAR LENS IMPLANT    . DILATION AND CURETTAGE OF UTERUS  1960    Allergies  Allergen Reactions  . Alendronate Sodium Other (See Comments)    Reaction:  GI upset   . Benzalkonium Chloride Other (See Comments)    "On MAR"  . Influenza Vaccines Other (See Comments)    Reaction:  Unknown   . Merthiolate [Thimerosal]  Outpatient Encounter Prescriptions as of 09/06/2017  Medication Sig  . acetaminophen (TYLENOL) 325 MG tablet Take 650 mg by mouth every 6 (six) hours as needed for mild pain, moderate pain or headache.   . DULoxetine (CYMBALTA) 20 MG capsule Take 20 mg by mouth daily.  . feeding supplement (BOOST / RESOURCE BREEZE) LIQD Take 1 Container by mouth 3 (three) times daily between meals.   Marland Kitchen HYDROcodone-acetaminophen (NORCO/VICODIN) 5-325 MG tablet Take 1 tablet  by mouth 3 (three) times daily. Can also take as needed in addition to the schedule dose.  Marland Kitchen Hypromellose (ARTIFICIAL TEARS OP) Apply 1 drop to eye 2 (two) times daily. Both eyes  . lubiprostone (AMITIZA) 24 MCG capsule Take 24 mcg by mouth daily.  . Memantine HCl-Donepezil HCl (NAMZARIC) 28-10 MG CP24 Take 1 capsule by mouth at bedtime.  . metoprolol (LOPRESSOR) 50 MG tablet Take 1 tablet (50 mg total) by mouth 2 (two) times daily.  . polyethylene glycol (MIRALAX / GLYCOLAX) packet Take 17 g by mouth daily as needed for mild constipation or moderate constipation.   . simethicone (MYLICON) 40 MG/0.6ML drops Take 0.6 mLs (40 mg total) by mouth 4 (four) times daily as needed for flatulence.   No facility-administered encounter medications on file as of 09/06/2017.    ROS was provided with assistance of staff Review of Systems  Constitutional: Negative for activity change, appetite change, chills, diaphoresis and fatigue.  Eyes: Negative for visual disturbance.  Respiratory: Negative for cough, choking, chest tightness and shortness of breath.   Cardiovascular: Negative for chest pain and leg swelling.  Gastrointestinal: Negative for abdominal distention and abdominal pain.  Genitourinary: Negative for dysuria.  Musculoskeletal: Positive for arthralgias, back pain and gait problem.  Neurological: Negative for dizziness, tremors and speech difficulty.  Psychiatric/Behavioral: Positive for confusion. Negative for agitation, behavioral problems and sleep disturbance.    Immunization History  Administered Date(s) Administered  . Influenza Whole 09/02/2005, 09/05/2007, 09/28/2012  . PPD Test 10/15/2013  . Pneumococcal Polysaccharide-23 11/24/2003  . Td 09/29/2002   Pertinent  Health Maintenance Due  Topic Date Due  . INFLUENZA VACCINE  09/22/2017 (Originally 06/23/2017)  . DEXA SCAN  Completed  . PNA vac Low Risk Adult  Completed   Fall Risk  07/02/2017 08/19/2015 04/01/2015 08/31/2013  Falls  in the past year? No No No Yes  Number falls in past yr: - - - 2 or more  Risk for fall due to : - - Impaired mobility;Impaired balance/gait Impaired balance/gait   Functional Status Survey:    Vitals:   09/06/17 1224  BP: 138/74  Pulse: 72  Resp: 20  Temp: 97.7 F (36.5 C)  Weight: 93 lb 3.2 oz (42.3 kg)  Height:  (1.626 m)   Body mass index is 16 kg/m. Physical Exam  Constitutional: She appears well-developed and well-nourished. No distress.  HENT:  Head: Normocephalic and atraumatic.  Eyes: Pupils are equal, round, and reactive to light. Conjunctivae and EOM are normal.  Neck: Normal range of motion. Neck supple.  Cardiovascular: Normal rate.   No murmur heard. Pulmonary/Chest: Effort normal and breath sounds normal. She has no wheezes.  Abdominal: Bowel sounds are normal. She exhibits no distension.  Musculoskeletal: She exhibits tenderness. She exhibits no edema.  Lymphadenopathy:    She has no cervical adenopathy.  Neurological: She is alert.  Oriented to self.   Skin: Skin is warm and dry. She is not diaphoretic.  Psychiatric: She has a normal mood and affect. Her behavior  is normal.    Labs reviewed:  Recent Labs  04/06/17 06/21/17 07/01/17 0025  NA 137 140 135  K 4.1 3.8 4.7  CL  --   --  98*  CO2  --   --  29  GLUCOSE  --   --  100*  BUN 23* 23* 24*  CREATININE 0.5 0.6 0.57  CALCIUM  --   --  9.3    Recent Labs  01/12/17 04/06/17 06/21/17  AST 11* 11* 12*  ALT 8 6* 8  ALKPHOS 59 95 65    Recent Labs  06/21/17 06/22/17 07/01/17 0025  WBC 6.1 6.1 5.9  HGB 12.1 12.1 11.9*  HCT 37 37 36.9  MCV  --   --  90.0  PLT 145* 145* 155   Lab Results  Component Value Date   TSH 1.00 06/23/2016   Lab Results  Component Value Date   HGBA1C 5.9 01/17/2013   Lab Results  Component Value Date   CHOL 181 01/07/2010   HDL 93.60 01/07/2010   LDLCALC 78 01/07/2010   TRIG 49.0 01/07/2010   CHOLHDL 2 01/07/2010    Significant Diagnostic  Results in last 30 days:  No results found.  Assessment/Plan A-fib (HCC) Afib, heart rate is in control, taking Metoprolol  bid, POA delined anticoagulation therapy, desires comfort measures, currently she is under Hospice service.   Dementia, vascular memory is preserved on Memantine/Aricept.   Mid back pain Back pain is managed with Norco tid.   Chronic constipation Constipation is managed with Simethicone, Polyethylene, Lubibprostone.  Depression with anxiety Her mood is stable on Cymbalta        Family/ staff Communication: plan of care reviewed with the patient and charge nurse.   Labs/tests ordered:  none  Time spend 25 minutes

## 2017-09-06 NOTE — Assessment & Plan Note (Signed)
memory is preserved on Memantine/Aricept.

## 2017-09-07 DIAGNOSIS — F0151 Vascular dementia with behavioral disturbance: Secondary | ICD-10-CM | POA: Diagnosis not present

## 2017-09-07 DIAGNOSIS — I509 Heart failure, unspecified: Secondary | ICD-10-CM | POA: Diagnosis not present

## 2017-09-07 DIAGNOSIS — I739 Peripheral vascular disease, unspecified: Secondary | ICD-10-CM | POA: Diagnosis not present

## 2017-09-07 DIAGNOSIS — I714 Abdominal aortic aneurysm, without rupture: Secondary | ICD-10-CM | POA: Diagnosis not present

## 2017-09-07 DIAGNOSIS — I672 Cerebral atherosclerosis: Secondary | ICD-10-CM | POA: Diagnosis not present

## 2017-09-07 DIAGNOSIS — I359 Nonrheumatic aortic valve disorder, unspecified: Secondary | ICD-10-CM | POA: Diagnosis not present

## 2017-09-08 ENCOUNTER — Encounter: Payer: Self-pay | Admitting: Nurse Practitioner

## 2017-09-08 DIAGNOSIS — R443 Hallucinations, unspecified: Secondary | ICD-10-CM | POA: Insufficient documentation

## 2017-09-09 ENCOUNTER — Encounter: Payer: Self-pay | Admitting: Nurse Practitioner

## 2017-09-09 ENCOUNTER — Non-Acute Institutional Stay (SKILLED_NURSING_FACILITY): Payer: Medicare Other | Admitting: Nurse Practitioner

## 2017-09-09 DIAGNOSIS — R443 Hallucinations, unspecified: Secondary | ICD-10-CM | POA: Diagnosis not present

## 2017-09-09 DIAGNOSIS — F418 Other specified anxiety disorders: Secondary | ICD-10-CM

## 2017-09-09 DIAGNOSIS — F0150 Vascular dementia without behavioral disturbance: Secondary | ICD-10-CM

## 2017-09-09 DIAGNOSIS — I714 Abdominal aortic aneurysm, without rupture: Secondary | ICD-10-CM | POA: Diagnosis not present

## 2017-09-09 DIAGNOSIS — I359 Nonrheumatic aortic valve disorder, unspecified: Secondary | ICD-10-CM | POA: Diagnosis not present

## 2017-09-09 DIAGNOSIS — F0151 Vascular dementia with behavioral disturbance: Secondary | ICD-10-CM | POA: Diagnosis not present

## 2017-09-09 DIAGNOSIS — I739 Peripheral vascular disease, unspecified: Secondary | ICD-10-CM | POA: Diagnosis not present

## 2017-09-09 DIAGNOSIS — I672 Cerebral atherosclerosis: Secondary | ICD-10-CM | POA: Diagnosis not present

## 2017-09-09 DIAGNOSIS — I509 Heart failure, unspecified: Secondary | ICD-10-CM | POA: Diagnosis not present

## 2017-09-09 NOTE — Progress Notes (Signed)
.  Location:  [facility] Room Number: 22 Place of Service:  SNF (31) Provider:  Doranne Schmutz, Manxie  NP  Oneal Grout, MD  Patient Care Team: Oneal Grout, MD as PCP - General (Internal Medicine) Laurey Morale, MD as Consulting Physician (Cardiology) Seville Downs X, NP as Nurse Practitioner (Internal Medicine)  Extended Emergency Contact Information Primary Emergency Contact: Daddario,Charles Address: [address]          [place], [place] [address] D[place] of [place] Home Phone: [phone] Relation: Son Secondary Emergency Contact: Stuart,Cleleste Address: [address]          [place], [place] [address] D[place] of [place] Home Phone: [phone] Mobile Phone: [phone] Relation: Daughter  Code Status:  DNR Goals of care: Advanced Directive information Advanced Directives 09/09/2017  Does Patient Have a Medical Advance Directive? Yes  Type of Advance Directive Out of facility DNR (pink MOST or yellow form)  Does patient want to make changes to medical advance directive? No - Patient declined  Copy of Healthcare Power of Attorney in Chart? -  Pre-existing out of facility DNR order (yellow form or pink MOST form) Yellow form placed in chart (order not valid for inpatient use)     Chief Complaint  Patient presents with  . Acute Visit    Started on Seroquel for hallucinations.    HPI:  Pt is a 83 y.o. female seen today for an acute visit for noted hallucination, Seroquel 12.5mg  po daily started subsequently, symptomatic  management, the  patient seems sleepy, needs more assistance with ADLs even if with feeding. Her goal of care is comfort measures, under Hospice service.    Hx of dementia, HPI was provided with assistance of staff, she still takes Memantine and Aricept for memory. Her mood is managed with Cymbalta 20mg  qd.    Past Medical History:  Diagnosis Date  . Abnormality of gait 09/29/2012  . Allergic rhinitis   . Anemia,  unspecified 01/12/2013  . Aortic insufficiency 04/16/2010   a. Echo 2011 - mild to moderate AI, EF 60%.  . Breast lump on left side at 7 o'clock position 07/02/2014   07/05/14 POA declined Mammogram. Desires no IVF. ABT for comfort measures only.    . Congestive heart failure, unspecified 06/30/2003  . Dementia   . Descending thoracic aortic dissection (HCC) 04/24/2010   a. MRA 2011 - begins distal to left subclavian and extends throughout the full length of the Ao.  Marland Kitchen External hemorrhoids without mention of complication 09/29/2012  . Failure to thrive in adult 04/01/2015  . GERD (gastroesophageal reflux disease)   . Hypertension   . Hyposmolality and/or hyponatremia 09/29/2012  . IBS (irritable bowel syndrome)   . Impacted cerumen 09/29/2012  . Lumbago 10/06/2012  . Osteoarthritis    hands  . Osteoporosis   . Other abnormal blood chemistry 01/12/2013  . Otitis externa 09/29/2012  . Palpitation    a. PAC's & PVC's by Holter - 2011  . Personal history of fall 10/06/2012  . Tortuous colon 2002  . Unspecified conductive hearing loss 09/29/2012  . Unspecified constipation 09/29/2012  . Unspecified venous (peripheral) insufficiency 09/29/2012   Past Surgical History:  Procedure Laterality Date  . CATARACT EXTRACTION W/ INTRAOCULAR LENS IMPLANT    . DILATION AND CURETTAGE OF UTERUS  1960    Allergies  Allergen Reactions  . Alendronate Sodium Other (See Comments)    Reaction:  GI upset   . Benzalkonium Chloride Other (See  Comments)    "On MAR"  . Influenza Vaccines Other (See Comments)    Reaction:  Unknown   . Merthiolate [Thimerosal]     Outpatient Encounter Prescriptions as of 09/09/2017  Medication Sig  . acetaminophen (TYLENOL) 325 MG tablet Take 650 mg by mouth every 6 (six) hours as needed for mild pain, moderate pain or headache.   . DULoxetine (CYMBALTA) 20 MG capsule Take 20 mg by mouth daily.  . feeding supplement (BOOST / RESOURCE BREEZE) LIQD Take 1 Container by mouth 3  (three) times daily between meals.   Marland Kitchen. HYDROcodone-acetaminophen (NORCO/VICODIN) 5-325 MG tablet Take 1 tablet by mouth 3 (three) times daily. Can also take as needed in addition to the schedule dose.  Marland Kitchen. Hypromellose (ARTIFICIAL TEARS OP) Apply 1 drop to eye 2 (two) times daily. Both eyes  . lubiprostone (AMITIZA) 24 MCG capsule Take 24 mcg by mouth daily.  . Memantine HCl-Donepezil HCl (NAMZARIC) 28-10 MG CP24 Take 1 capsule by mouth at bedtime.  . metoprolol (LOPRESSOR) 50 MG tablet Take 1 tablet (50 mg total) by mouth 2 (two) times daily.  . polyethylene glycol (MIRALAX / GLYCOLAX) packet Take 17 g by mouth daily as needed for mild constipation or moderate constipation.   . QUEtiapine (SEROQUEL) 12.5 mg TABS tablet Take 12.5 mg by mouth at bedtime. For Hallucinations  . simethicone (MYLICON) 40 MG/0.6ML drops Take 0.6 mLs (40 mg total) by mouth 4 (four) times daily as needed for flatulence.   No facility-administered encounter medications on file as of 09/09/2017.    ROS was provided with assistance of staff Review of Systems  Constitutional: Positive for activity change and fatigue. Negative for chills, diaphoresis and fever.       Thin   Respiratory: Negative for cough, choking, chest tightness, shortness of breath and wheezing.   Cardiovascular: Negative for chest pain and palpitations.  Gastrointestinal: Negative for abdominal distention, abdominal pain, constipation, diarrhea, nausea and vomiting.  Genitourinary: Negative for dysuria and hematuria.       Incontiinet  Musculoskeletal: Positive for arthralgias, back pain and gait problem.  Neurological: Negative for speech difficulty, weakness and headaches.  Psychiatric/Behavioral: Positive for confusion and hallucinations. Negative for agitation, behavioral problems and sleep disturbance.    Immunization History  Administered Date(s) Administered  . Influenza Whole 09/02/2005, 09/05/2007, 09/28/2012  . PPD Test 10/15/2013  .  Pneumococcal Polysaccharide-23 11/24/2003  . Td 09/29/2002   Pertinent  Health Maintenance Due  Topic Date Due  . INFLUENZA VACCINE  09/22/2017 (Originally 06/23/2017)  . DEXA SCAN  Completed  . PNA vac Low Risk Adult  Completed   Fall Risk  07/02/2017 08/19/2015 04/01/2015 08/31/2013  Falls in the past year? No No No Yes  Number falls in past yr: - - - 2 or more  Risk for fall due to : - - Impaired mobility;Impaired balance/gait Impaired balance/gait   Functional Status Survey:    Vitals:   09/09/17 1153  BP: 140/66  Pulse: 70  Resp: 20  Temp: 97.7 F (36.5 C)  Weight: 93 lb 3.2 oz (42.3 kg)  Height: 5\' 4"  (1.626 m)   Body mass index is 16 kg/m. Physical Exam  Constitutional: She appears well-developed and well-nourished.  Eyes: Pupils are equal, round, and reactive to light. Conjunctivae and EOM are normal.  Neck: Normal range of motion. Neck supple.  Cardiovascular: Normal rate and regular rhythm.   Murmur heard. Pulmonary/Chest: Effort normal and breath sounds normal. She has no wheezes. She has no rales.  Abdominal: Soft. Bowel sounds are normal. She exhibits no distension.  Musculoskeletal: Normal range of motion. She exhibits tenderness. She exhibits no edema.  Mid back pain  Neurological: She exhibits normal muscle tone.  Psychiatric: She has a normal mood and affect.    Labs reviewed:  Recent Labs  04/06/17 06/21/17 07/01/17 0025  NA 137 140 135  K 4.1 3.8 4.7  CL  --   --  98*  CO2  --   --  29  GLUCOSE  --   --  100*  BUN 23* 23* 24*  CREATININE 0.5 0.6 0.57  CALCIUM  --   --  9.3    Recent Labs  01/12/17 04/06/17 06/21/17  AST 11* 11* 12*  ALT 8 6* 8  ALKPHOS 59 95 65    Recent Labs  06/21/17 06/22/17 07/01/17 0025  WBC 6.1 6.1 5.9  HGB 12.1 12.1 11.9*  HCT 37 37 36.9  MCV  --   --  90.0  PLT 145* 145* 155   Lab Results  Component Value Date   TSH 1.00 06/23/2016   Lab Results  Component Value Date   HGBA1C 5.9 01/17/2013   Lab  Results  Component Value Date   CHOL 181 01/07/2010   HDL 93.60 01/07/2010   LDLCALC 78 01/07/2010   TRIG 49.0 01/07/2010   CHOLHDL 2 01/07/2010    Significant Diagnostic Results in last 30 days:  No results found.  Assessment/Plan Hallucination 09/07/17 Hospice: Seroquel 12.5mg  po qd 09/09/17 too soon to eval, the patient appears sleepy and needs more assistance with ADLs, but comfortable which is her goal of care. Observe.   Dementia, vascular Hx of dementia, resides in SNF,  HPI was provided with assistance of staff, she still takes Memantine and Aricept for memory.  Depression with anxiety  Her mood is managed with Cymbalta 20mg  qd. May consider taper it off      Family/ staff Communication: plan of care reviewed with the patient and charge nurse.   Labs/tests ordered:  none  Time spend 25 minutes

## 2017-09-10 NOTE — Assessment & Plan Note (Signed)
Her mood is managed with Cymbalta 20mg  qd. May consider taper it off

## 2017-09-10 NOTE — Assessment & Plan Note (Signed)
09/07/17 Hospice: Seroquel 12.5mg  po qd 09/09/17 too soon to eval, the patient appears sleepy and needs more assistance with ADLs, but comfortable which is her goal of care. Observe.

## 2017-09-10 NOTE — Assessment & Plan Note (Signed)
Hx of dementia, resides in SNF,  HPI was provided with assistance of staff, she still takes Memantine and Aricept for memory.

## 2017-09-13 DIAGNOSIS — I714 Abdominal aortic aneurysm, without rupture: Secondary | ICD-10-CM | POA: Diagnosis not present

## 2017-09-13 DIAGNOSIS — I359 Nonrheumatic aortic valve disorder, unspecified: Secondary | ICD-10-CM | POA: Diagnosis not present

## 2017-09-13 DIAGNOSIS — I672 Cerebral atherosclerosis: Secondary | ICD-10-CM | POA: Diagnosis not present

## 2017-09-13 DIAGNOSIS — I509 Heart failure, unspecified: Secondary | ICD-10-CM | POA: Diagnosis not present

## 2017-09-13 DIAGNOSIS — I739 Peripheral vascular disease, unspecified: Secondary | ICD-10-CM | POA: Diagnosis not present

## 2017-09-13 DIAGNOSIS — F0151 Vascular dementia with behavioral disturbance: Secondary | ICD-10-CM | POA: Diagnosis not present

## 2017-09-14 DIAGNOSIS — I739 Peripheral vascular disease, unspecified: Secondary | ICD-10-CM | POA: Diagnosis not present

## 2017-09-14 DIAGNOSIS — I509 Heart failure, unspecified: Secondary | ICD-10-CM | POA: Diagnosis not present

## 2017-09-14 DIAGNOSIS — I714 Abdominal aortic aneurysm, without rupture: Secondary | ICD-10-CM | POA: Diagnosis not present

## 2017-09-14 DIAGNOSIS — I672 Cerebral atherosclerosis: Secondary | ICD-10-CM | POA: Diagnosis not present

## 2017-09-14 DIAGNOSIS — I359 Nonrheumatic aortic valve disorder, unspecified: Secondary | ICD-10-CM | POA: Diagnosis not present

## 2017-09-14 DIAGNOSIS — F0151 Vascular dementia with behavioral disturbance: Secondary | ICD-10-CM | POA: Diagnosis not present

## 2017-09-16 DIAGNOSIS — F0151 Vascular dementia with behavioral disturbance: Secondary | ICD-10-CM | POA: Diagnosis not present

## 2017-09-16 DIAGNOSIS — I714 Abdominal aortic aneurysm, without rupture: Secondary | ICD-10-CM | POA: Diagnosis not present

## 2017-09-16 DIAGNOSIS — I359 Nonrheumatic aortic valve disorder, unspecified: Secondary | ICD-10-CM | POA: Diagnosis not present

## 2017-09-16 DIAGNOSIS — I672 Cerebral atherosclerosis: Secondary | ICD-10-CM | POA: Diagnosis not present

## 2017-09-16 DIAGNOSIS — I509 Heart failure, unspecified: Secondary | ICD-10-CM | POA: Diagnosis not present

## 2017-09-16 DIAGNOSIS — I739 Peripheral vascular disease, unspecified: Secondary | ICD-10-CM | POA: Diagnosis not present

## 2017-09-23 DEATH — deceased

## 2017-12-17 IMAGING — CR DG CHEST 2V
2 series · 2 of 2 positions shown · non-contrast
Comparison: 05/28/2012

CLINICAL DATA: Weakness and cough

EXAM:
CHEST  2 VIEW

[w chest lat]
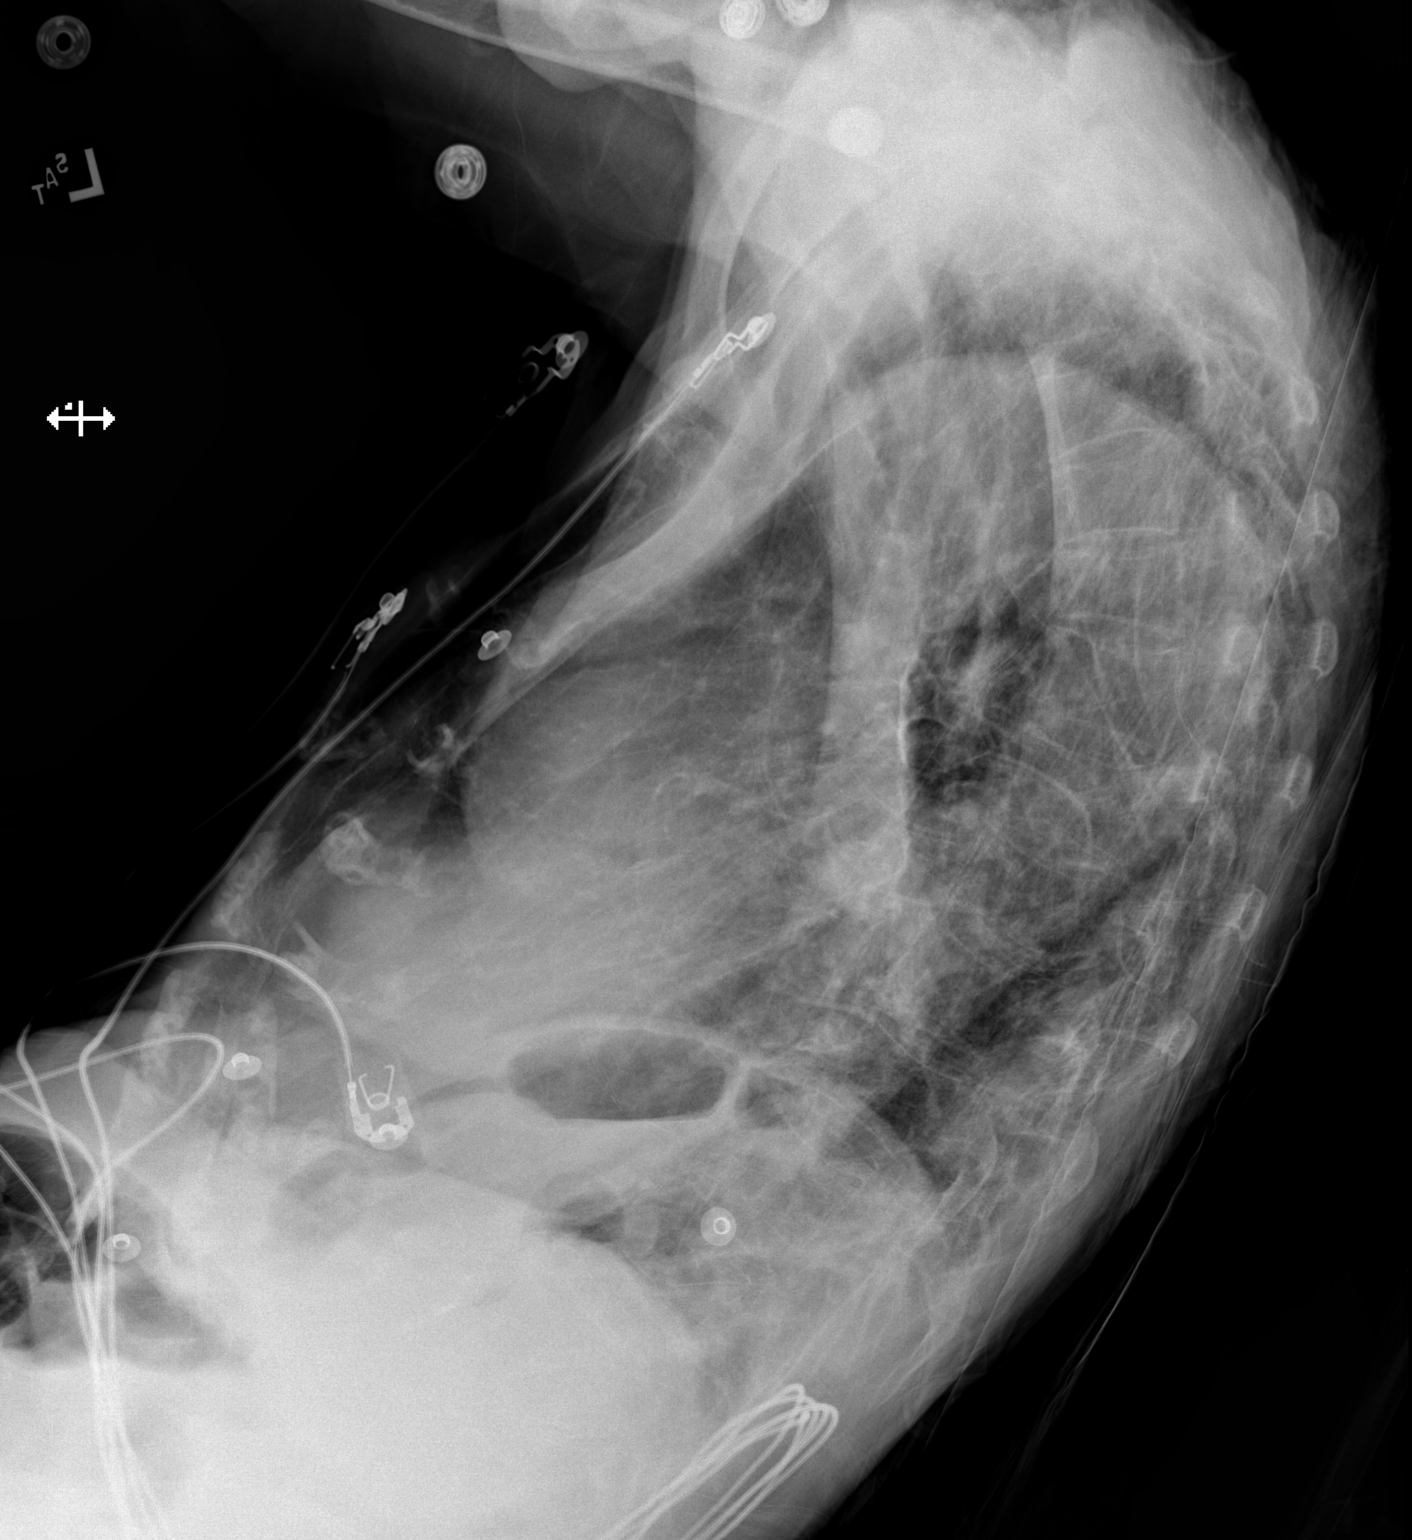

[x chest ap]
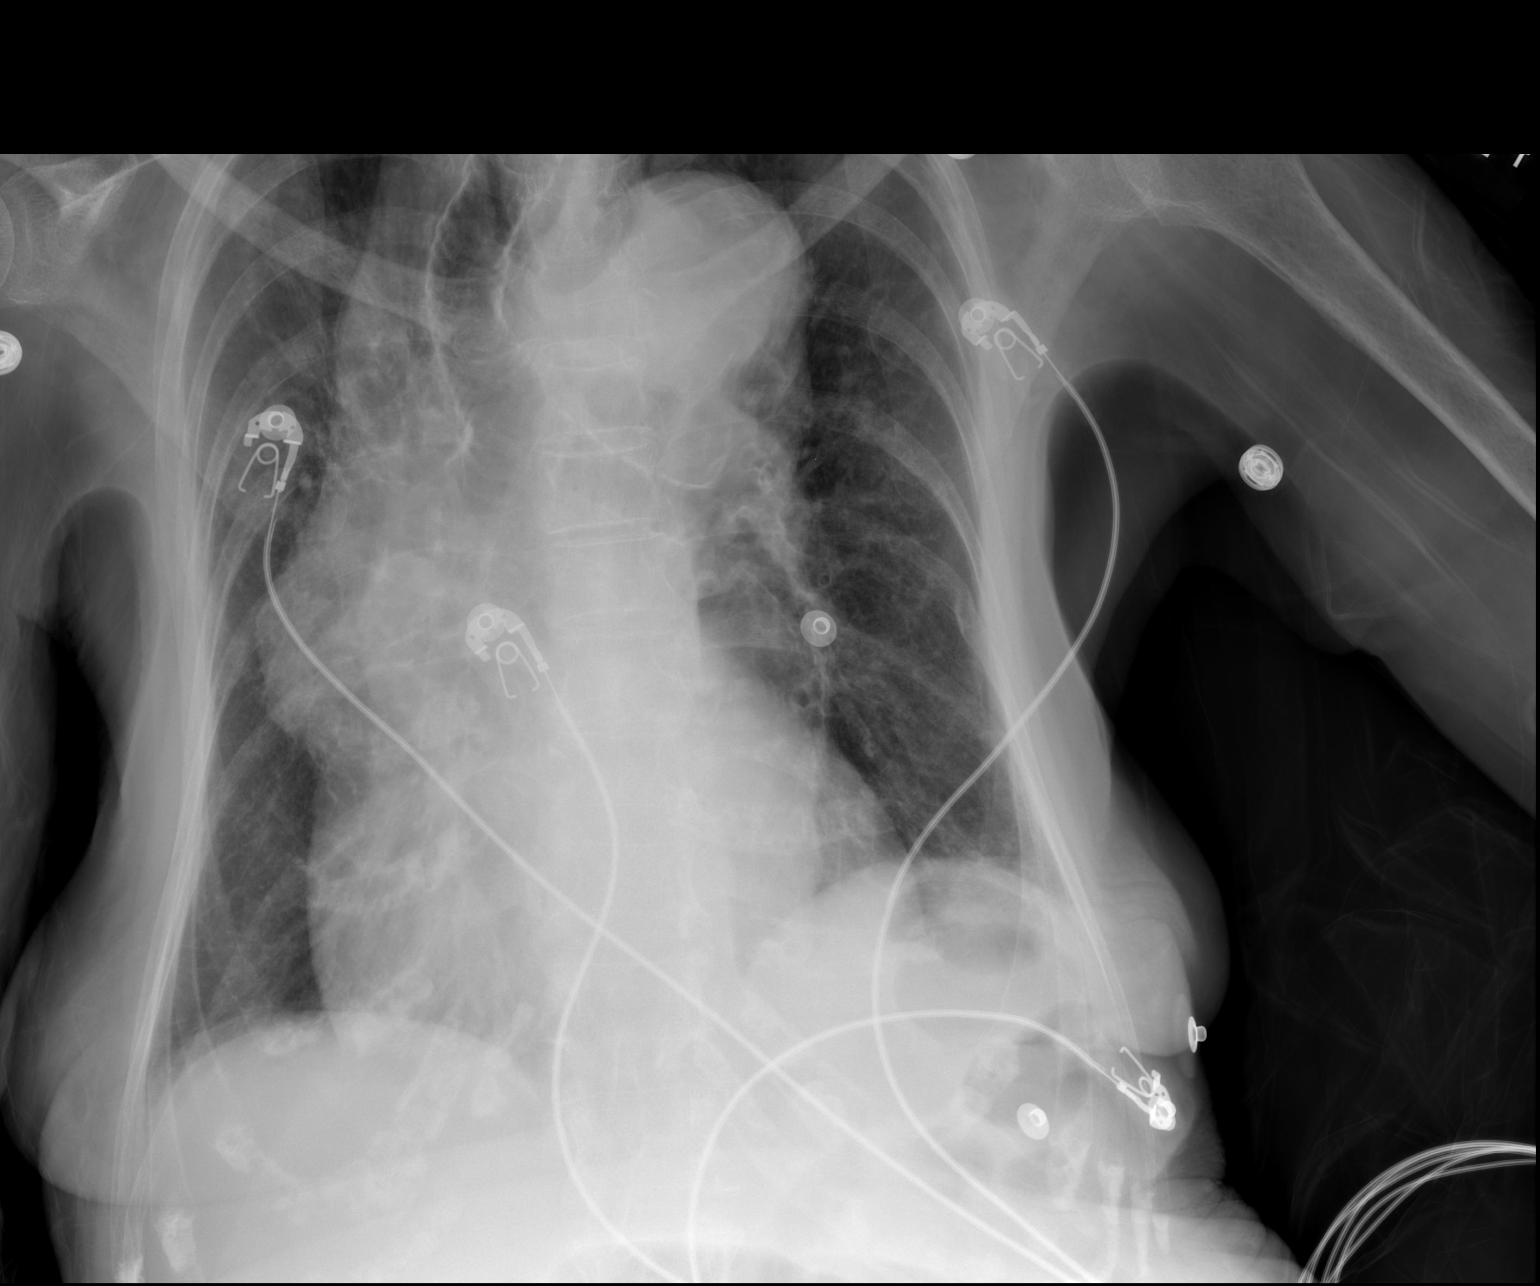

[2 of 2 positions shown; findings below may reference images not displayed]

FINDINGS: Cardiac shadow is enlarged but stable. The thoracic aorta is again
tortuous somewhat accentuated by patient rotation. The overall
appearance is stable. Elevation of left hemidiaphragm is same. No
focal infiltrate or sizable effusion is noted. Degenerative changes
of the thoracic spine are seen.
IMPRESSION: No acute abnormality noted.

## 2018-12-20 IMAGING — DX DG CHEST 2V
2 series · 2 of 2 positions shown · non-contrast
Comparison: Chest radiograph performed 06/28/2016

CLINICAL DATA: Acute onset of left upper chest pain. Initial
encounter.

EXAM:
CHEST  2 VIEW

[chest lat]
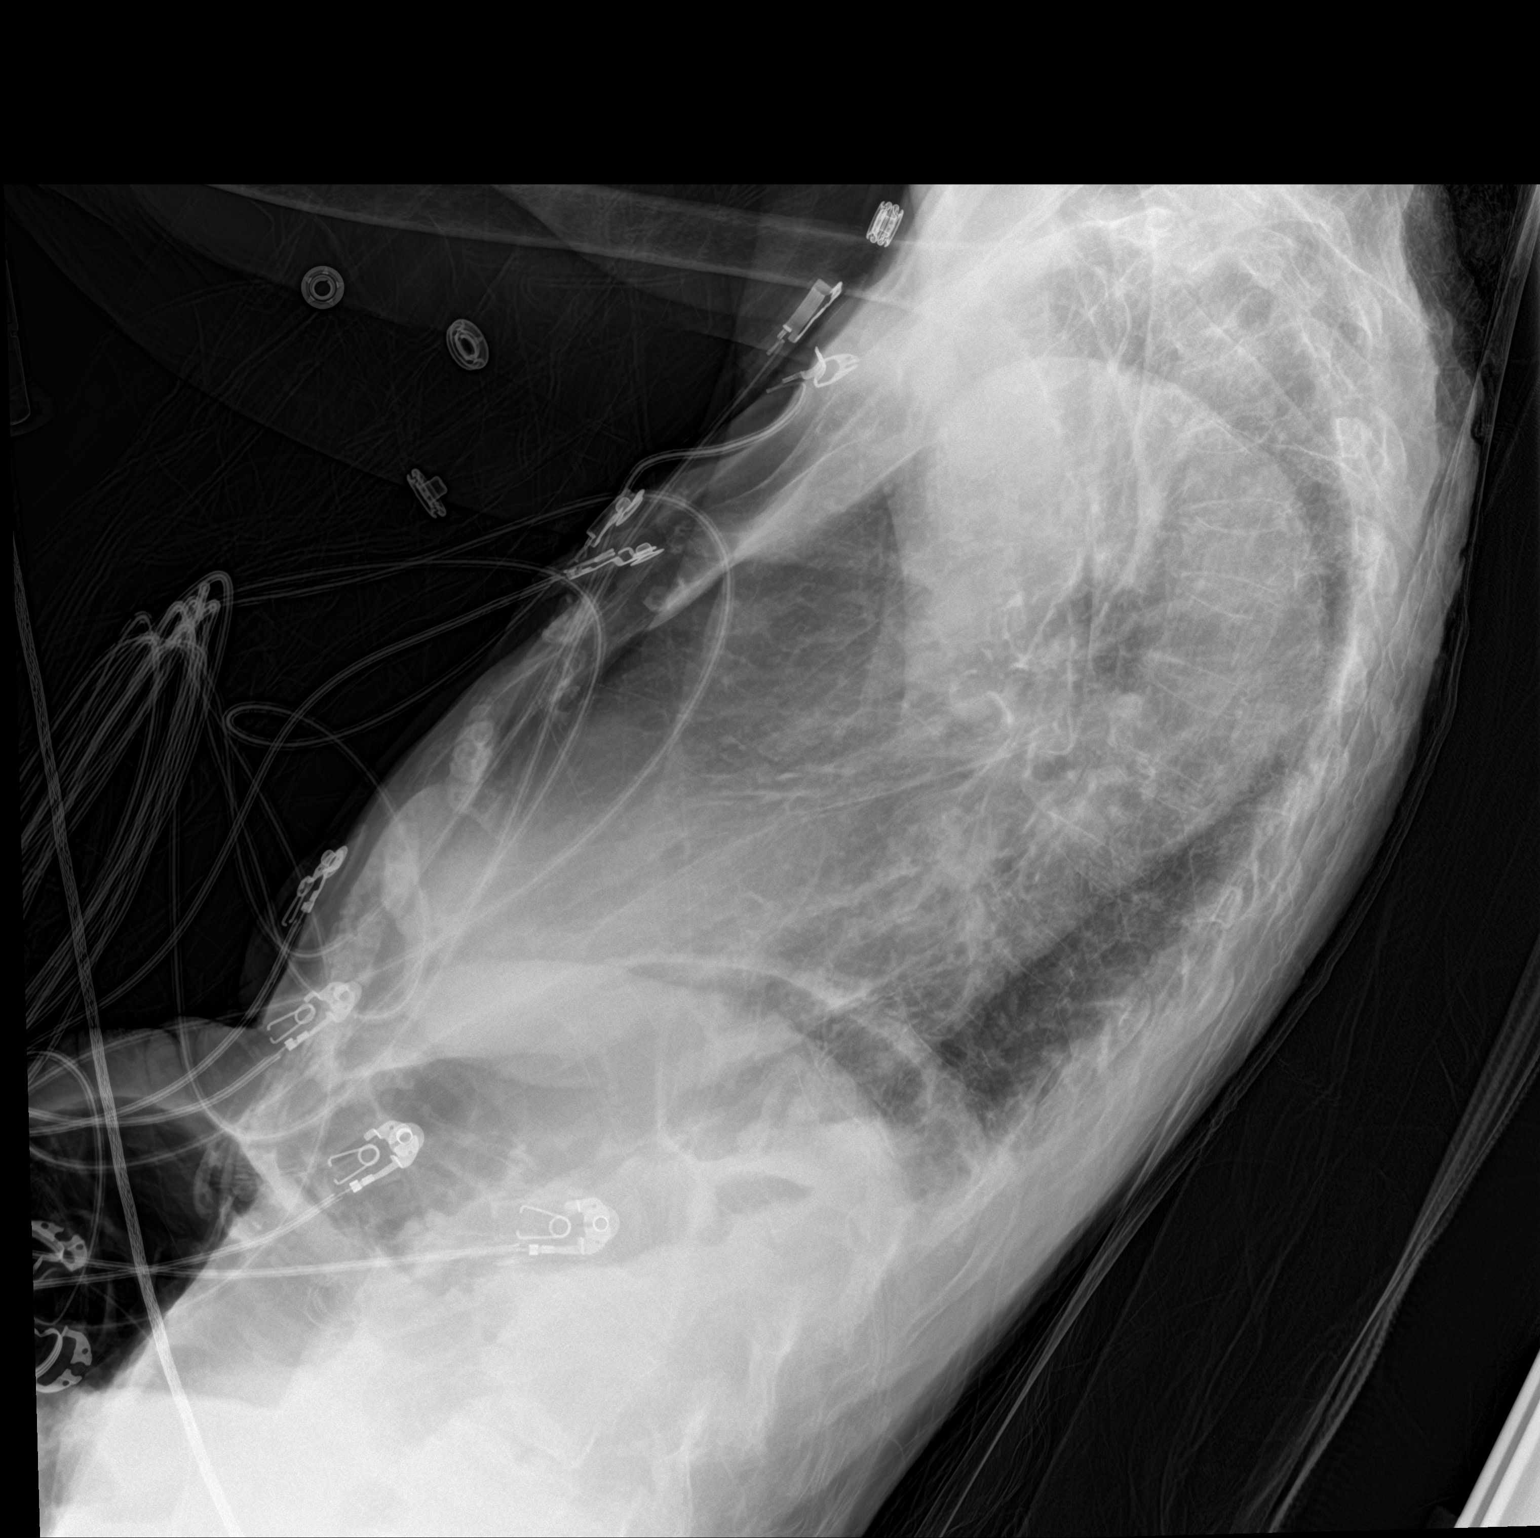

[chest ap]
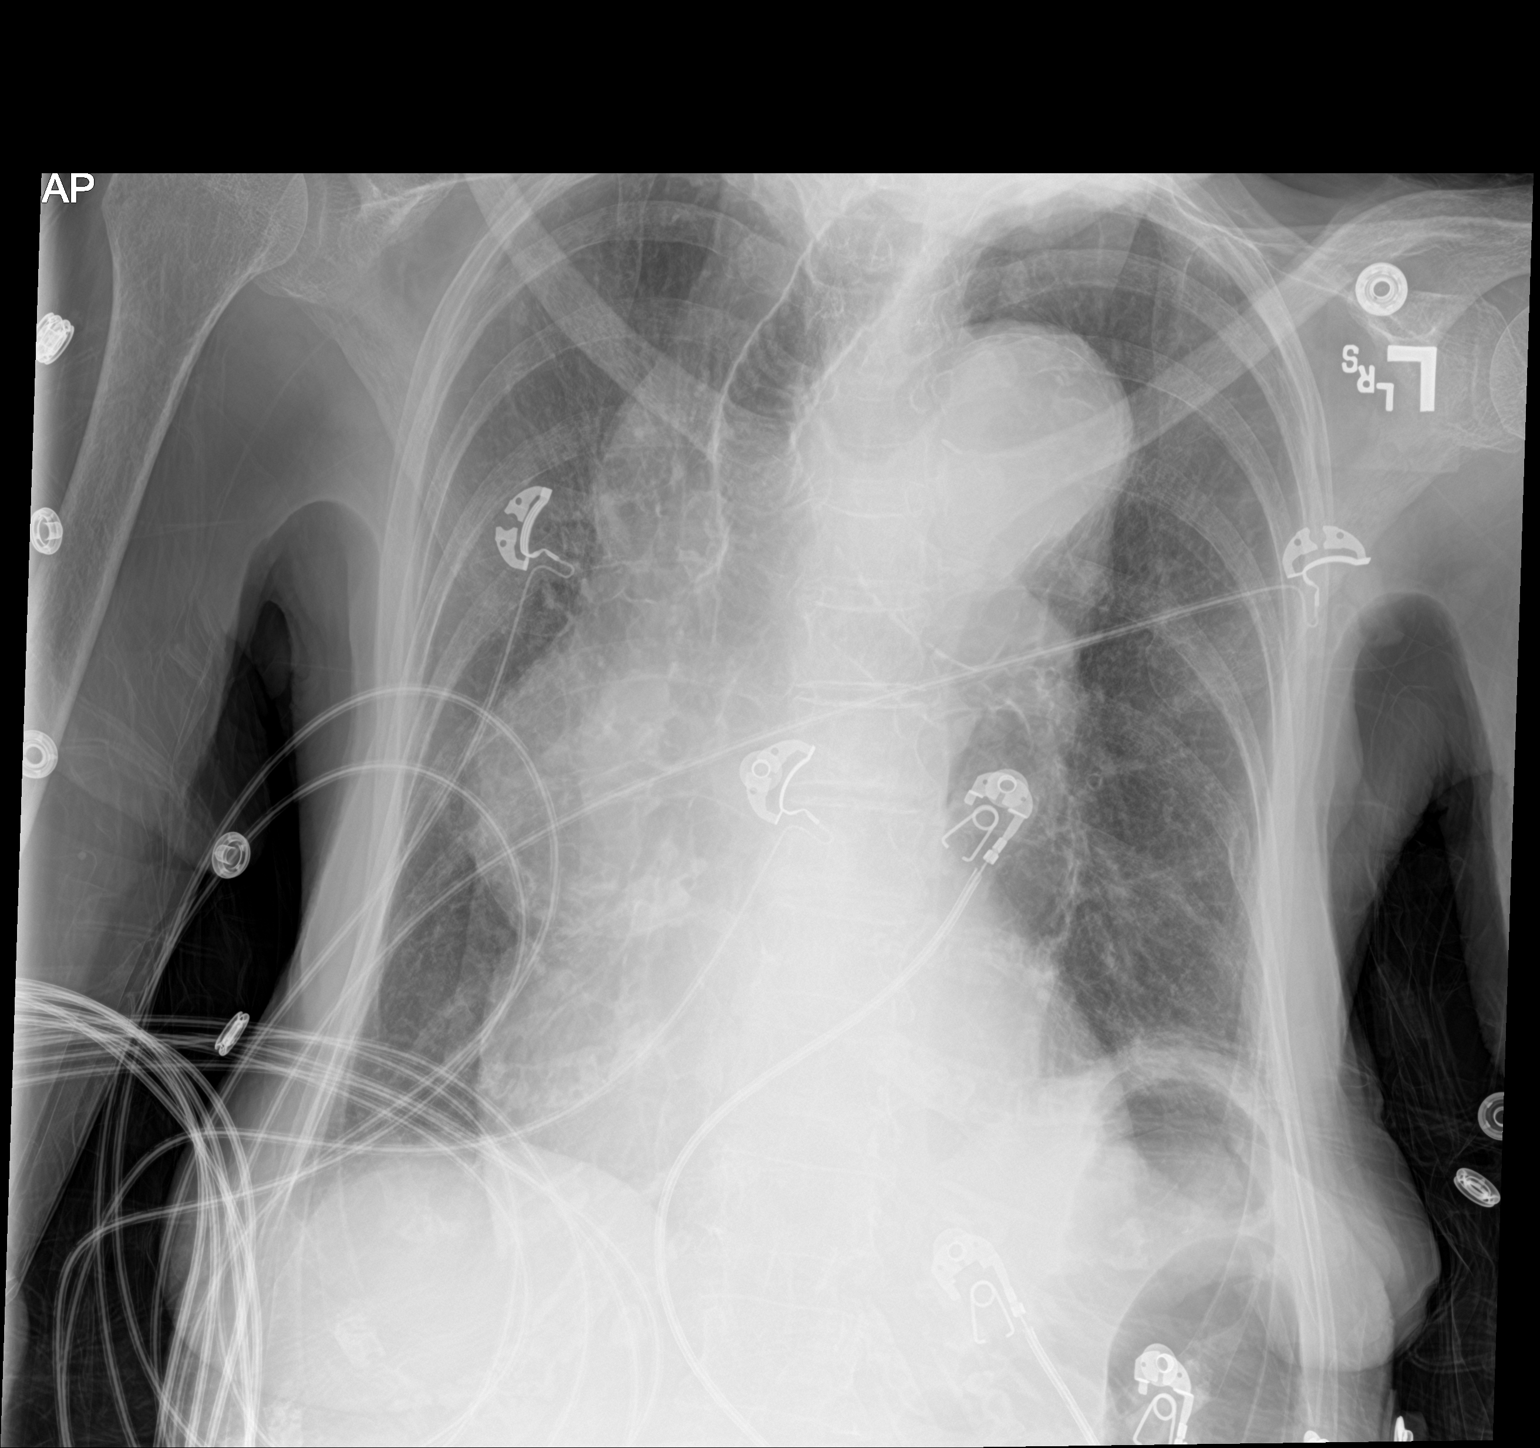

[2 of 2 positions shown; findings below may reference images not displayed]

FINDINGS: The lungs are well-aerated. Mild left basilar atelectasis is noted.
Peribronchial thickening is seen. There is no evidence of pleural
effusion or pneumothorax.

The heart is mildly enlarged. There is aneurysmal dilatation of the
ascending thoracic aorta. No acute osseous abnormalities are seen.
IMPRESSION: 1. Mild left basilar atelectasis noted. Peribronchial thickening
seen.
2. Mild cardiomegaly. Aneurysmal dilatation of the ascending
thoracic aorta, as previously noted.
